# Patient Record
Sex: Female | Born: 1950 | Race: White | Hispanic: No | Smoking: Current every day smoker
Health system: Southern US, Community
[De-identification: ages and names within clinical notes are randomized; demographics above are authoritative.]

## PROBLEM LIST (undated history)

## (undated) DIAGNOSIS — K219 Gastro-esophageal reflux disease without esophagitis: Secondary | ICD-10-CM

## (undated) DIAGNOSIS — T7840XA Allergy, unspecified, initial encounter: Secondary | ICD-10-CM

## (undated) DIAGNOSIS — F419 Anxiety disorder, unspecified: Secondary | ICD-10-CM

## (undated) DIAGNOSIS — R011 Cardiac murmur, unspecified: Secondary | ICD-10-CM

## (undated) DIAGNOSIS — R0902 Hypoxemia: Secondary | ICD-10-CM

## (undated) DIAGNOSIS — E785 Hyperlipidemia, unspecified: Secondary | ICD-10-CM

## (undated) DIAGNOSIS — C50919 Malignant neoplasm of unspecified site of unspecified female breast: Secondary | ICD-10-CM

## (undated) DIAGNOSIS — C801 Malignant (primary) neoplasm, unspecified: Secondary | ICD-10-CM

## (undated) DIAGNOSIS — M1990 Unspecified osteoarthritis, unspecified site: Secondary | ICD-10-CM

## (undated) DIAGNOSIS — J449 Chronic obstructive pulmonary disease, unspecified: Secondary | ICD-10-CM

## (undated) DIAGNOSIS — K5730 Diverticulosis of large intestine without perforation or abscess without bleeding: Secondary | ICD-10-CM

## (undated) DIAGNOSIS — J45909 Unspecified asthma, uncomplicated: Secondary | ICD-10-CM

## (undated) DIAGNOSIS — H269 Unspecified cataract: Secondary | ICD-10-CM

## (undated) DIAGNOSIS — C4490 Unspecified malignant neoplasm of skin, unspecified: Secondary | ICD-10-CM

## (undated) DIAGNOSIS — G709 Myoneural disorder, unspecified: Secondary | ICD-10-CM

## (undated) DIAGNOSIS — R0600 Dyspnea, unspecified: Secondary | ICD-10-CM

## (undated) DIAGNOSIS — Z923 Personal history of irradiation: Secondary | ICD-10-CM

## (undated) DIAGNOSIS — N289 Disorder of kidney and ureter, unspecified: Secondary | ICD-10-CM

## (undated) HISTORY — DX: Chronic obstructive pulmonary disease, unspecified: J44.9

## (undated) HISTORY — DX: Anxiety disorder, unspecified: F41.9

## (undated) HISTORY — DX: Unspecified cataract: H26.9

## (undated) HISTORY — DX: Cardiac murmur, unspecified: R01.1

## (undated) HISTORY — DX: Hypoxemia: R09.02

## (undated) HISTORY — DX: Hyperlipidemia, unspecified: E78.5

## (undated) HISTORY — DX: Unspecified osteoarthritis, unspecified site: M19.90

## (undated) HISTORY — DX: Unspecified malignant neoplasm of skin, unspecified: C44.90

## (undated) HISTORY — DX: Unspecified asthma, uncomplicated: J45.909

## (undated) HISTORY — PX: TUBAL LIGATION: SHX77

## (undated) HISTORY — DX: Myoneural disorder, unspecified: G70.9

## (undated) HISTORY — PX: APPENDECTOMY: SHX54

## (undated) HISTORY — DX: Allergy, unspecified, initial encounter: T78.40XA

## (undated) HISTORY — PX: ECTOPIC PREGNANCY SURGERY: SHX613

---

## 2007-02-12 ENCOUNTER — Ambulatory Visit: Payer: Self-pay | Admitting: Obstetrics and Gynecology

## 2007-04-08 ENCOUNTER — Ambulatory Visit: Payer: Self-pay | Admitting: Gastroenterology

## 2007-11-08 ENCOUNTER — Ambulatory Visit: Payer: Self-pay | Admitting: Emergency Medicine

## 2009-08-04 ENCOUNTER — Ambulatory Visit: Payer: Self-pay | Admitting: Unknown Physician Specialty

## 2009-08-14 ENCOUNTER — Emergency Department: Payer: Self-pay | Admitting: Emergency Medicine

## 2009-08-16 ENCOUNTER — Observation Stay: Payer: Self-pay | Admitting: Internal Medicine

## 2009-09-12 ENCOUNTER — Ambulatory Visit: Payer: Self-pay | Admitting: Gastroenterology

## 2010-01-11 ENCOUNTER — Ambulatory Visit: Payer: Self-pay | Admitting: Obstetrics and Gynecology

## 2011-01-01 ENCOUNTER — Ambulatory Visit: Payer: Self-pay | Admitting: Family Medicine

## 2011-04-03 ENCOUNTER — Ambulatory Visit: Payer: Self-pay | Admitting: Obstetrics and Gynecology

## 2011-04-30 ENCOUNTER — Emergency Department: Payer: Self-pay | Admitting: Emergency Medicine

## 2012-05-20 ENCOUNTER — Ambulatory Visit: Payer: Self-pay | Admitting: Obstetrics and Gynecology

## 2013-04-19 ENCOUNTER — Ambulatory Visit: Payer: Self-pay

## 2013-05-20 ENCOUNTER — Ambulatory Visit: Payer: Self-pay | Admitting: Gastroenterology

## 2013-05-21 LAB — PATHOLOGY REPORT

## 2013-08-11 ENCOUNTER — Ambulatory Visit: Payer: Self-pay

## 2013-08-11 ENCOUNTER — Other Ambulatory Visit: Payer: Self-pay

## 2013-08-11 LAB — COMPREHENSIVE METABOLIC PANEL
Albumin: 4 g/dL (ref 3.4–5.0)
Alkaline Phosphatase: 101 U/L (ref 50–136)
BUN: 12 mg/dL (ref 7–18)
Bilirubin,Total: 0.3 mg/dL (ref 0.2–1.0)
Calcium, Total: 9.6 mg/dL (ref 8.5–10.1)
Co2: 26 mmol/L (ref 21–32)
Creatinine: 0.87 mg/dL (ref 0.60–1.30)
EGFR (African American): >60
Potassium: 4.3 mmol/L (ref 3.5–5.1)
SGPT (ALT): 47 U/L (ref 12–78)
Total Protein: 8.1 g/dL (ref 6.4–8.2)

## 2013-08-11 LAB — CBC WITH DIFFERENTIAL/PLATELET
Basophil #: 0.1 10*3/uL (ref 0.0–0.1)
Basophil %: 1.4 %
Eosinophil #: 0.3 10*3/uL (ref 0.0–0.7)
Eosinophil %: 3.2 %
HGB: 15.3 g/dL (ref 12.0–16.0)
Lymphocyte #: 3.5 10*3/uL (ref 1.0–3.6)
Lymphocyte %: 39.7 %
MCH: 30.8 pg (ref 26.0–34.0)
MCHC: 34.4 g/dL (ref 32.0–36.0)
MCV: 90 fL (ref 80–100)
Monocyte %: 6.6 %
RBC: 4.95 10*6/uL (ref 3.80–5.20)
WBC: 8.7 10*3/uL (ref 3.6–11.0)

## 2013-08-11 LAB — LIPID PANEL
Cholesterol: 248 mg/dL — ABNORMAL HIGH (ref 0–200)
HDL Cholesterol: 36 mg/dL — ABNORMAL LOW (ref 40–60)
VLDL Cholesterol, Calc: 47 mg/dL — ABNORMAL HIGH (ref 5–40)

## 2014-02-06 ENCOUNTER — Ambulatory Visit: Payer: Self-pay | Admitting: Emergency Medicine

## 2014-02-06 LAB — RAPID STREP-A WITH REFLX: Micro Text Report: NEGATIVE

## 2014-02-09 LAB — BETA STREP CULTURE(ARMC)

## 2014-02-23 ENCOUNTER — Ambulatory Visit: Payer: Self-pay

## 2014-02-23 LAB — CBC WITH DIFFERENTIAL/PLATELET
Basophil #: 0.1 10*3/uL (ref 0.0–0.1)
Basophil %: 0.7 %
Eosinophil #: 0.2 10*3/uL (ref 0.0–0.7)
Eosinophil %: 2 %
HCT: 42.5 % (ref 35.0–47.0)
HGB: 14.1 g/dL (ref 12.0–16.0)
Lymphocyte #: 3.5 10*3/uL (ref 1.0–3.6)
Lymphocyte %: 34 %
MCH: 29.9 pg (ref 26.0–34.0)
MCHC: 33.2 g/dL (ref 32.0–36.0)
MCV: 90 fL (ref 80–100)
MONOS PCT: 10.5 %
Monocyte #: 1.1 x10 3/mm — ABNORMAL HIGH (ref 0.2–0.9)
Neutrophil #: 5.4 10*3/uL (ref 1.4–6.5)
Neutrophil %: 52.8 %
Platelet: 232 10*3/uL (ref 150–440)
RBC: 4.72 10*6/uL (ref 3.80–5.20)
RDW: 13.3 % (ref 11.5–14.5)
WBC: 10.3 10*3/uL (ref 3.6–11.0)

## 2014-10-19 ENCOUNTER — Ambulatory Visit: Payer: Self-pay | Admitting: Obstetrics and Gynecology

## 2015-08-31 ENCOUNTER — Other Ambulatory Visit: Payer: Self-pay | Admitting: Nurse Practitioner

## 2015-08-31 DIAGNOSIS — Z1231 Encounter for screening mammogram for malignant neoplasm of breast: Secondary | ICD-10-CM

## 2015-10-23 ENCOUNTER — Ambulatory Visit
Admission: RE | Admit: 2015-10-23 | Discharge: 2015-10-23 | Disposition: A | Discharge status: Home / Self Care | Payer: 59 | Source: Ambulatory Visit | Attending: Nurse Practitioner | Admitting: Nurse Practitioner

## 2015-10-23 DIAGNOSIS — Z1231 Encounter for screening mammogram for malignant neoplasm of breast: Secondary | ICD-10-CM | POA: Insufficient documentation

## 2016-03-01 ENCOUNTER — Emergency Department
Admission: EM | Admit: 2016-03-01 | Discharge: 2016-03-01 | Disposition: A | Discharge status: Home / Self Care | Payer: 59 | Attending: Emergency Medicine | Admitting: Emergency Medicine

## 2016-03-01 ENCOUNTER — Encounter: Payer: Self-pay | Admitting: *Deleted

## 2016-03-01 ENCOUNTER — Emergency Department: Payer: 59

## 2016-03-01 ENCOUNTER — Encounter: Payer: Self-pay | Admitting: Emergency Medicine

## 2016-03-01 ENCOUNTER — Ambulatory Visit
Admission: EM | Admit: 2016-03-01 | Discharge: 2016-03-01 | Disposition: A | Discharge status: Home / Self Care | Payer: 59 | Attending: Family Medicine | Admitting: Family Medicine

## 2016-03-01 DIAGNOSIS — R1030 Lower abdominal pain, unspecified: Secondary | ICD-10-CM

## 2016-03-01 DIAGNOSIS — K5720 Diverticulitis of large intestine with perforation and abscess without bleeding: Secondary | ICD-10-CM | POA: Diagnosis not present

## 2016-03-01 DIAGNOSIS — R079 Chest pain, unspecified: Secondary | ICD-10-CM

## 2016-03-01 DIAGNOSIS — Z79899 Other long term (current) drug therapy: Secondary | ICD-10-CM | POA: Insufficient documentation

## 2016-03-01 DIAGNOSIS — Z720 Tobacco use: Secondary | ICD-10-CM | POA: Diagnosis not present

## 2016-03-01 DIAGNOSIS — F17200 Nicotine dependence, unspecified, uncomplicated: Secondary | ICD-10-CM | POA: Insufficient documentation

## 2016-03-01 DIAGNOSIS — R1032 Left lower quadrant pain: Secondary | ICD-10-CM | POA: Diagnosis present

## 2016-03-01 HISTORY — DX: Gastro-esophageal reflux disease without esophagitis: K21.9

## 2016-03-01 HISTORY — DX: Diverticulosis of large intestine without perforation or abscess without bleeding: K57.30

## 2016-03-01 LAB — COMPREHENSIVE METABOLIC PANEL
ALT: 37 U/L (ref 14–54)
ANION GAP: 9 (ref 5–15)
AST: 34 U/L (ref 15–41)
Albumin: 4.7 g/dL (ref 3.5–5.0)
Alkaline Phosphatase: 78 U/L (ref 38–126)
BUN: 7 mg/dL (ref 6–20)
CHLORIDE: 107 mmol/L (ref 101–111)
CO2: 25 mmol/L (ref 22–32)
Calcium: 10 mg/dL (ref 8.9–10.3)
Creatinine, Ser: 0.72 mg/dL (ref 0.44–1.00)
GFR calc Af Amer: >60 mL/min (ref >60)
Glucose, Bld: 96 mg/dL (ref 65–99)
POTASSIUM: 4.4 mmol/L (ref 3.5–5.1)
Sodium: 141 mmol/L (ref 135–145)
Total Bilirubin: 0.3 mg/dL (ref 0.3–1.2)
Total Protein: 8.4 g/dL — ABNORMAL HIGH (ref 6.5–8.1)

## 2016-03-01 LAB — URINALYSIS COMPLETE WITH MICROSCOPIC (ARMC ONLY)
BACTERIA UA: NONE SEEN
Bilirubin Urine: NEGATIVE
Glucose, UA: NEGATIVE mg/dL
HGB URINE DIPSTICK: NEGATIVE
Ketones, ur: NEGATIVE mg/dL
LEUKOCYTES UA: NEGATIVE
Nitrite: NEGATIVE
PH: 6 (ref 5.0–8.0)
PROTEIN: NEGATIVE mg/dL
RBC / HPF: NONE SEEN RBC/hpf (ref 0–5)
Specific Gravity, Urine: 1.001 — ABNORMAL LOW (ref 1.005–1.030)
WBC UA: NONE SEEN WBC/hpf (ref 0–5)

## 2016-03-01 LAB — TROPONIN I: Troponin I: <0.03 ng/mL (ref <0.031)

## 2016-03-01 LAB — LIPASE, BLOOD: LIPASE: 12 U/L (ref 11–51)

## 2016-03-01 LAB — CBC
HEMATOCRIT: 42.5 % (ref 35.0–47.0)
HEMOGLOBIN: 14.2 g/dL (ref 12.0–16.0)
MCH: 30.8 pg (ref 26.0–34.0)
MCHC: 33.4 g/dL (ref 32.0–36.0)
MCV: 92.4 fL (ref 80.0–100.0)
Platelets: 285 10*3/uL (ref 150–440)
RBC: 4.6 MIL/uL (ref 3.80–5.20)
RDW: 13 % (ref 11.5–14.5)
WBC: 11.9 10*3/uL — AB (ref 3.6–11.0)

## 2016-03-01 MED ORDER — IOPAMIDOL (ISOVUE-300) INJECTION 61%
100.0000 mL | Freq: Once | INTRAVENOUS | Status: AC | PRN
  Administered 2016-03-01: 100 mL via INTRAVENOUS
  Filled 2016-03-01: qty 100

## 2016-03-01 MED ORDER — ONDANSETRON 8 MG PO TBDP: 8.0000 mg | ORAL_TABLET | Freq: Three times a day (TID) | ORAL | Status: DC | PRN

## 2016-03-01 MED ORDER — KETOROLAC TROMETHAMINE 30 MG/ML IJ SOLN
30.0000 mg | Freq: Once | INTRAMUSCULAR | Status: AC
  Administered 2016-03-01: 30 mg via INTRAVENOUS

## 2016-03-01 MED ORDER — KETOROLAC TROMETHAMINE 30 MG/ML IJ SOLN
INTRAMUSCULAR | Status: AC
  Administered 2016-03-01: 30 mg via INTRAVENOUS
  Filled 2016-03-01: qty 1

## 2016-03-01 MED ORDER — LEVOFLOXACIN 750 MG PO TABS: 750.0000 mg | ORAL_TABLET | Freq: Every day | ORAL | Status: DC

## 2016-03-01 MED ORDER — DIATRIZOATE MEGLUMINE & SODIUM 66-10 % PO SOLN
30.0000 mL | ORAL | Status: AC
  Filled 2016-03-01: qty 30

## 2016-03-01 MED ORDER — SODIUM CHLORIDE 0.9 % IV BOLUS (SEPSIS)
1000.0000 mL | Freq: Once | INTRAVENOUS | Status: AC
  Administered 2016-03-01: 1000 mL via INTRAVENOUS

## 2016-03-01 MED ORDER — OXYCODONE-ACETAMINOPHEN 5-325 MG PO TABS: 1.0000 | ORAL_TABLET | Freq: Four times a day (QID) | ORAL | Status: DC | PRN

## 2016-03-01 MED ORDER — METRONIDAZOLE 500 MG PO TABS: 500.0000 mg | ORAL_TABLET | Freq: Three times a day (TID) | ORAL | Status: DC

## 2016-03-01 NOTE — Discharge Instructions (Signed)
Abdominal Pain, Adult Many things can cause belly (abdominal) pain. Most times, the belly pain is not dangerous. Many cases of belly pain can be watched and treated at home. HOME CARE   Do not take medicines that help you go poop (laxatives) unless told to by your doctor.  Only take medicine as told by your doctor.  Eat or drink as told by your doctor. Your doctor will tell you if you should be on a special diet. GET HELP IF:  You do not know what is causing your belly pain.  You have belly pain while you are sick to your stomach (nauseous) or have runny poop (diarrhea).  You have pain while you pee or poop.  Your belly pain wakes you up at night.  You have belly pain that gets worse or better when you eat.  You have belly pain that gets worse when you eat fatty foods.  You have a fever. GET HELP RIGHT AWAY IF:   The pain does not go away within 2 hours.  You keep throwing up (vomiting).  The pain changes and is only in the right or left part of the belly.  You have bloody or tarry looking poop. MAKE SURE YOU:   Understand these instructions.  Will watch your condition.  Will get help right away if you are not doing well or get worse.   This information is not intended to replace advice given to you by your health care provider. Make sure you discuss any questions you have with your health care provider.   Document Released: 04/15/2008 Document Revised: 11/18/2014 Document Reviewed: 07/07/2013 Elsevier Interactive Patient Education 2016 [employer].  Chest Pain Observation It is often hard to give a specific diagnosis for the cause of chest pain. Among other possibilities your symptoms might be caused by inadequate oxygen delivery to your heart (angina). Angina that is not treated or evaluated can lead to a heart attack (myocardial infarction) or death. Blood tests, electrocardiograms, and X-rays may have been done to help determine a possible cause of your chest  pain. After evaluation and observation, your health care provider has determined that it is unlikely your pain was caused by an unstable condition that requires hospitalization. However, a full evaluation of your pain may need to be completed, with additional diagnostic testing as directed. It is very important to keep your follow-up appointments. Not keeping your follow-up appointments could result in permanent heart damage, disability, or death. If there is any problem keeping your follow-up appointments, you must call your health care provider. HOME CARE INSTRUCTIONS  Due to the slight chance that your pain could be angina, it is important to follow your health care provider's treatment plan and also maintain a healthy lifestyle:  Maintain or work toward achieving a healthy weight.  Stay physically active and exercise regularly.  Decrease your salt intake.  Eat a balanced, healthy diet. Talk to a dietitian to learn about heart-healthy foods.  Increase your fiber intake by including whole grains, vegetables, fruits, and nuts in your diet.  Avoid situations that cause stress, anger, or depression.  Take medicines as advised by your health care provider. Report any side effects to your health care provider. Do not stop medicines or adjust the dosages on your own.  Quit smoking. Do not use nicotine patches or gum until you check with your health care provider.  Keep your blood pressure, blood sugar, and cholesterol levels within normal limits.  Limit alcohol intake to no more  than 1 drink per day for women who are not pregnant and 2 drinks per day for men.  Do not abuse drugs. SEEK IMMEDIATE MEDICAL CARE IF: You have severe chest pain or pressure which may include symptoms such as:  You feel pain or pressure in your arms, neck, jaw, or back.  You have severe back or abdominal pain, feel sick to your stomach (nauseous), or throw up (vomit).  You are sweating profusely.  You are having  a fast or irregular heartbeat.  You feel short of breath while at rest.  You notice increasing shortness of breath during rest, sleep, or with activity.  You have chest pain that does not get better after rest or after taking your usual medicine.  You wake from sleep with chest pain.  You are unable to sleep because you cannot breathe.  You develop a frequent cough or you are coughing up blood.  You feel dizzy, faint, or experience extreme fatigue.  You develop severe weakness, dizziness, fainting, or chills. Any of these symptoms may represent a serious problem that is an emergency. Do not wait to see if the symptoms will go away. Call your local emergency services (911 in the [place] Do not drive yourself to the hospital. MAKE SURE YOU:  Understand these instructions.  Will watch your condition.  Will get help right away if you are not doing well or get worse.   This information is not intended to replace advice given to you by your health care provider. Make sure you discuss any questions you have with your health care provider.   Document Released: 11/30/2010 Document Revised: 11/02/2013 Document Reviewed: 04/29/2013 Elsevier Interactive Patient Education 2016 [employer]  Nonspecific Chest Pain  Chest pain can be caused by many different conditions. There is always a chance that your pain could be related to something serious, such as a heart attack or a blood clot in your lungs. Chest pain can also be caused by conditions that are not life-threatening. If you have chest pain, it is very important to follow up with your health care provider. CAUSES  Chest pain can be caused by:  Heartburn.  Pneumonia or bronchitis.  Anxiety or stress.  Inflammation around your heart (pericarditis) or lung (pleuritis or pleurisy).  A blood clot in your lung.  A collapsed lung (pneumothorax). It can develop suddenly on its own (spontaneous pneumothorax) or from trauma to the  chest.  Shingles infection (varicella-zoster virus).  Heart attack.  Damage to the bones, muscles, and cartilage that make up your chest wall. This can include:  Bruised bones due to injury.  Strained muscles or cartilage due to frequent or repeated coughing or overwork.  Fracture to one or more ribs.  Sore cartilage due to inflammation (costochondritis). RISK FACTORS  Risk factors for chest pain may include:  Activities that increase your risk for trauma or injury to your chest.  Respiratory infections or conditions that cause frequent coughing.  Medical conditions or overeating that can cause heartburn.  Heart disease or family history of heart disease.  Conditions or health behaviors that increase your risk of developing a blood clot.  Having had chicken pox (varicella zoster). SIGNS AND SYMPTOMS Chest pain can feel like:  Burning or tingling on the surface of your chest or deep in your chest.  Crushing, pressure, aching, or squeezing pain.  Dull or sharp pain that is worse when you move, cough, or take a deep breath.  Pain that is also felt in  your back, neck, shoulder, or arm, or pain that spreads to any of these areas. Your chest pain may come and go, or it may stay constant. DIAGNOSIS Lab tests or other studies may be needed to find the cause of your pain. Your health care provider may have you take a test called an ambulatory ECG (electrocardiogram). An ECG records your heartbeat patterns at the time the test is performed. You may also have other tests, such as:  Transthoracic echocardiogram (TTE). During echocardiography, sound waves are used to create a picture of all of the heart structures and to look at how blood flows through your heart.  Transesophageal echocardiogram (TEE).This is a more advanced imaging test that obtains images from inside your body. It allows your health care provider to see your heart in finer detail.  Cardiac monitoring. This allows  your health care provider to monitor your heart rate and rhythm in real time.  Holter monitor. This is a portable device that records your heartbeat and can help to diagnose abnormal heartbeats. It allows your health care provider to track your heart activity for several days, if needed.  Stress tests. These can be done through exercise or by taking medicine that makes your heart beat more quickly.  Blood tests.  Imaging tests. TREATMENT  Your treatment depends on what is causing your chest pain. Treatment may include:  Medicines. These may include:  Acid blockers for heartburn.  Anti-inflammatory medicine.  Pain medicine for inflammatory conditions.  Antibiotic medicine, if an infection is present.  Medicines to dissolve blood clots.  Medicines to treat coronary artery disease.  Supportive care for conditions that do not require medicines. This may include:  Resting.  Applying heat or cold packs to injured areas.  Limiting activities until pain decreases. HOME CARE INSTRUCTIONS  If you were prescribed an antibiotic medicine, finish it all even if you start to feel better.  Avoid any activities that bring on chest pain.  Do not use any tobacco products, including cigarettes, chewing tobacco, or electronic cigarettes. If you need help quitting, ask your health care provider.  Do not drink alcohol.  Take medicines only as directed by your health care provider.  Keep all follow-up visits as directed by your health care provider. This is important. This includes any further testing if your chest pain does not go away.  If heartburn is the cause for your chest pain, you may be told to keep your head raised (elevated) while sleeping. This reduces the chance that acid will go from your stomach into your esophagus.  Make lifestyle changes as directed by your health care provider. These may include:  Getting regular exercise. Ask your health care provider to suggest some  activities that are safe for you.  Eating a heart-healthy diet. A registered dietitian can help you to learn healthy eating options.  Maintaining a healthy weight.  Managing diabetes, if necessary.  Reducing stress. SEEK MEDICAL CARE IF:  Your chest pain does not go away after treatment.  You have a rash with blisters on your chest.  You have a fever. SEEK IMMEDIATE MEDICAL CARE IF:   Your chest pain is worse.  You have an increasing cough, or you cough up blood.  You have severe abdominal pain.  You have severe weakness.  You faint.  You have chills.  You have sudden, unexplained chest discomfort.  You have sudden, unexplained discomfort in your arms, back, neck, or jaw.  You have shortness of breath at any time.  You suddenly start to sweat, or your skin gets clammy.  You feel nauseous or you vomit.  You suddenly feel light-headed or dizzy.  Your heart begins to beat quickly, or it feels like it is skipping beats. These symptoms may represent a serious problem that is an emergency. Do not wait to see if the symptoms will go away. Get medical help right away. Call your local emergency services (911 in the [place] Do not drive yourself to the hospital.   This information is not intended to replace advice given to you by your health care provider. Make sure you discuss any questions you have with your health care provider.   Document Released: 08/07/2005 Document Revised: 11/18/2014 Document Reviewed: 06/03/2014 Elsevier Interactive Patient Education [employer].

## 2016-03-01 NOTE — ED Notes (Signed)
CT notified that pt completed contrast at this time

## 2016-03-01 NOTE — Discharge Instructions (Signed)
You were prescribed a medication that is potentially sedating. Do not drink alcohol, drive or participate in any other potentially dangerous activities while taking this medication as it may make you sleepy. Do not take this medication with any other sedating medications, either prescription or over-the-counter. If you were prescribed Percocet or Vicodin, do not take these with acetaminophen (Tylenol) as it is already contained within these medications.   Opioid pain medications (or "narcotics") can be habit forming.  Use it as little as possible to achieve adequate pain control.  Do not use or use it with extreme caution if you have a history of opiate abuse or dependence.  If you are on a pain contract with your primary care doctor or a pain specialist, be sure to let them know you were prescribed this medication today from the [facility].  This medication is intended for your use only - do not give any to anyone else and keep it in a secure place where nobody else, especially children and pets, have access to it.  It will also cause or worsen constipation, so you may want to consider taking an over-the-counter stool softener while you are taking this medication.  Diverticulitis Diverticulitis is inflammation or infection of small pouches in your colon that form when you have a condition called diverticulosis. The pouches in your colon are called diverticula. Your colon, or large intestine, is where water is absorbed and stool is formed. Complications of diverticulitis can include:  Bleeding.  Severe infection.  Severe pain.  Perforation of your colon.  Obstruction of your colon. CAUSES  Diverticulitis is caused by bacteria. Diverticulitis happens when stool becomes trapped in diverticula. This allows bacteria to grow in the diverticula, which can lead to inflammation and infection. RISK FACTORS People with diverticulosis are at risk for diverticulitis. Eating a  diet that does not include enough fiber from fruits and vegetables may make diverticulitis more likely to develop. SYMPTOMS  Symptoms of diverticulitis may include:  Abdominal pain and tenderness. The pain is normally located on the left side of the abdomen, but may occur in other areas.  Fever and chills.  Bloating.  Cramping.  Nausea.  Vomiting.  Constipation.  Diarrhea.  Blood in your stool. DIAGNOSIS  Your health care provider will ask you about your medical history and do a physical exam. You may need to have tests done because many medical conditions can cause the same symptoms as diverticulitis. Tests may include:  Blood tests.  Urine tests.  Imaging tests of the abdomen, including X-rays and CT scans. When your condition is under control, your health care provider may recommend that you have a colonoscopy. A colonoscopy can show how severe your diverticula are and whether something else is causing your symptoms. TREATMENT  Most cases of diverticulitis are mild and can be treated at home. Treatment may include:  Taking over-the-counter pain medicines.  Following a clear liquid diet.  Taking antibiotic medicines by mouth for 7-10 days. More severe cases may be treated at a hospital. Treatment may include:  Not eating or drinking.  Taking prescription pain medicine.  Receiving antibiotic medicines through an IV tube.  Receiving fluids and nutrition through an IV tube.  Surgery. HOME CARE INSTRUCTIONS   Follow your health care provider's instructions carefully.  Follow a full liquid diet or other diet as directed by your health care provider. After your symptoms improve, your health care provider may tell you to change your diet. He or she may  recommend you eat a high-fiber diet. Fruits and vegetables are good sources of fiber. Fiber makes it easier to pass stool.  Take fiber supplements or probiotics as directed by your health care provider.  Only take  medicines as directed by your health care provider.  Keep all your follow-up appointments. SEEK MEDICAL CARE IF:   Your pain does not improve.  You have a hard time eating food.  Your bowel movements do not return to normal. SEEK IMMEDIATE MEDICAL CARE IF:   Your pain becomes worse.  Your symptoms do not get better.  Your symptoms suddenly get worse.  You have a fever.  You have repeated vomiting.  You have bloody or black, tarry stools. MAKE SURE YOU:   Understand these instructions.  Will watch your condition.  Will get help right away if you are not doing well or get worse.   This information is not intended to replace advice given to you by your health care provider. Make sure you discuss any questions you have with your health care provider.   Document Released: 08/07/2005 Document Revised: 11/02/2013 Document Reviewed: 09/22/2013 Elsevier Interactive Patient Education [employer].

## 2016-03-01 NOTE — ED Notes (Signed)
Patient started having lower abdominal pain yesterday after eating lunch and had to leave work early.  Pain is presently located above the pelvis and radiates bilaterally. Patient has been diagnosed with diverticula and has had diverticulitis in the past. Patient was having normal BM's and voiding before yesterday. No BM's since pain began.

## 2016-03-01 NOTE — ED Provider Notes (Addendum)
CSN: [id number]     Arrival date & time 03/01/16  1311 History   First MD Initiated Contact with Patient 03/01/16 1339    Nurses notes were reviewed. Chief Complaint  Patient presents with  . Abdominal Pain   Patient is here because abdominal pain. She states yesterday she started having lower abdominal pain. She had a history of diverticulitis really say when the last time that was. His been several years. More than probably T10. She thinks she had a colonoscopy about 7 years plain polyps at that time as well. She called her primary care physician in [place] who suggested that she come here according to her. At this time we do not have a CT scan tech available to do CT scans and then mentioned that she's been having some vague chest pain for the last 3 or 4 days that comes and goes as well. Reports some shortness of breath but she still smokes. She denies being on any medication. She states her paternal aunt had breast cancer and she's had ectopic pregnancy appendectomy. He is allergic to penicillin and sulfa antibiotics. As well as Biaxin.       (Consider location/radiation/quality/duration/timing/severity/associated sxs/prior Treatment) Patient is a 65 y.o. female presenting with abdominal pain. The history is provided by the patient. No language interpreter was used.  Abdominal Pain Pain location: As lower abdominal pain. Pain quality: sharp   Pain radiates to:  Does not radiate Pain severity:  Moderate Onset quality:  Sudden Progression:  Worsening Chronicity:  New Context: awakening from sleep   Context: not previous surgeries   Relieved by:  Nothing Worsened by:  Nothing tried Ineffective treatments:  None tried Associated symptoms: chest pain and shortness of breath   Risk factors: not pregnant     Past Medical History  Diagnosis Date  . Diverticula of colon   . GERD (gastroesophageal reflux disease)    Past Surgical History  Procedure Laterality Date  . Cesarean  section    . Ectopic pregnancy surgery    . Appendectomy     Family History  Problem Relation Age of Onset  . Breast cancer Paternal Aunt    Social History  Substance Use Topics  . Smoking status: Current Every Day Smoker  . Smokeless tobacco: Never Used  . Alcohol Use: Yes   OB History    No data available     Review of Systems  Respiratory: Positive for shortness of breath.   Cardiovascular: Positive for chest pain.  Gastrointestinal: Positive for abdominal pain.  All other systems reviewed and are negative.   Allergies  Penicillins; Shellfish allergy; Sulfa antibiotics; and Biaxin  Home Medications   Prior to Admission medications   Medication Sig Start Date End Date Taking? Authorizing Provider  lansoprazole (PREVACID) 15 MG capsule Take 15 mg by mouth daily at 12 noon.   Yes Historical Provider, MD   Meds Ordered and Administered this Visit  Medications - No data to display  BP 93/62 mmHg  Pulse 102  Temp(Src) 98.2 F (36.8 C) (Oral)  Resp 18  Ht 5\' 2"  (1.575 m)  Wt 154 lb (69.854 kg)  BMI 28.16 kg/m2  SpO2 97% No data found.   Physical Exam  Constitutional: She is oriented to person, place, and time. She appears well-developed and well-nourished.  HENT:  Head: Normocephalic and atraumatic.  Eyes: Pupils are equal, round, and reactive to light.  Neck: Neck supple.  Cardiovascular: Normal rate and regular rhythm.   Pulmonary/Chest: Effort normal.  Abdominal: There is no hepatosplenomegaly. There is tenderness. There is no CVA tenderness. No hernia. Hernia confirmed negative in the ventral area.    Musculoskeletal: Normal range of motion.  Neurological: She is alert and oriented to person, place, and time.  Psychiatric: She has a normal mood and affect.  Vitals reviewed.   ED Course  Procedures (including critical care time)  Labs Review Labs Reviewed - No data to display  Imaging Review No results found.   Visual Acuity Review  Right  Eye Distance:   Left Eye Distance:   Bilateral Distance:    Right Eye Near:   Left Eye Near:    Bilateral Near:         MDM   1. Lower abdominal pain   2. Chest pain, unspecified chest pain type   3. Tobacco abuse    CT scans available here at this time. Also concerned because of her history of smoking and nonspecific chest pain. Recommend since. They want to have a CT scan sent to [facility] regional ED and discussed with Myriam Jacobson about their eminent arrival. Also concerned about thischest pain this morning TO 3 days. Sister drive her but she wants her sister follow her and since she's not having active chest pain and was allowed to go.  Note: This dictation was prepared with Dragon dictation along with smaller phrase technology. Any transcriptional errors that result from this process are unintentional.    Frederich Cha, MD 03/01/16 1459  Frederich Cha, MD 03/01/16 2156

## 2016-03-01 NOTE — ED Provider Notes (Signed)
[facility] Emergency Department Provider Note  ____________________________________________  Time seen: 3:40 PM  I have reviewed the triage vital signs and the nursing notes.   HISTORY  Chief Complaint Abdominal Pain    HPI Stephanie Hess is a 65 y.o. female sent to the ED from urgent care due to left lower quadrant abdominal pain which she thinks is consistent with previous diverticulitis. She reports that her pain started yesterday after lunch and has been gradually worsening currently moderate in intensity, waxing and waning, nonradiating. No nausea vomiting or diarrhea. No bloody stool. She's actually had constipation for the last 3 days. No chest pain shortness of breath dizziness or syncope. No aggravating or alleviating factors.  PCP is Dr. Howell Rucks"     Past Medical History  Diagnosis Date  . Diverticula of colon   . GERD (gastroesophageal reflux disease)      There are no active problems to display for this patient.    Past Surgical History  Procedure Laterality Date  . Cesarean section    . Ectopic pregnancy surgery    . Appendectomy       Current Outpatient Rx  Name  Route  Sig  Dispense  Refill  . lansoprazole (PREVACID) 15 MG capsule   Oral   Take 15 mg by mouth daily at 12 noon.         Marland Kitchen levofloxacin (LEVAQUIN) 750 MG tablet   Oral   Take 1 tablet (750 mg total) by mouth daily.   7 tablet   0   . metroNIDAZOLE (FLAGYL) 500 MG tablet   Oral   Take 1 tablet (500 mg total) by mouth 3 (three) times daily.   30 tablet   0   . ondansetron (ZOFRAN ODT) 8 MG disintegrating tablet   Oral   Take 1 tablet (8 mg total) by mouth every 8 (eight) hours as needed for nausea or vomiting.   20 tablet   0   . oxyCODONE-acetaminophen (ROXICET) 5-325 MG tablet   Oral   Take 1 tablet by mouth every 6 (six) hours as needed for severe pain.   12 tablet   0      Allergies Penicillins; Shellfish allergy; Sulfa antibiotics;  and Biaxin   Family History  Problem Relation Age of Onset  . Breast cancer Paternal Aunt     Social History Social History  Substance Use Topics  . Smoking status: Current Every Day Smoker  . Smokeless tobacco: Never Used  . Alcohol Use: Yes    Review of Systems  Constitutional:   No fever or chills.  Eyes:   No vision changes.  ENT:   No sore throat. No rhinorrhea. Cardiovascular:   Positive for chest pain which is occasional and only present when leaning over. Not exertional, not pleuritic, achy, nonradiating, not associated with shortness of breath diaphoresis or vomiting.Marland Kitchen Respiratory:   No dyspnea or cough. Gastrointestinal:   Positive as above for left lower quadrant abdominal pain without vomiting and diarrhea.  No bloody stool. Genitourinary:   Negative for dysuria or difficulty urinating. Musculoskeletal:   Negative for focal pain or swelling Neurological:   Negative for headaches 10-point ROS otherwise negative.  ____________________________________________   PHYSICAL EXAM:  VITAL SIGNS: ED Triage Vitals  Enc Vitals Group     BP 03/01/16 1442 159/90 mmHg     Pulse Rate 03/01/16 1442 103     Resp 03/01/16 1442 18     Temp 03/01/16 1442 98.1 F (36.7  C)     Temp Source 03/01/16 1442 Oral     SpO2 03/01/16 1442 99 %     Weight 03/01/16 1442 154 lb (69.854 kg)     Height 03/01/16 1442 5\' 2"  (1.575 m)     Head Cir --      Peak Flow --      Pain Score 03/01/16 1443 2     Pain Loc --      Pain Edu? --      Excl. in [place]? --     Vital signs reviewed, nursing assessments reviewed.   Constitutional:   Alert and oriented. Well appearing and in no distress. Eyes:   No scleral icterus. No conjunctival pallor. PERRL. EOMI ENT   Head:   Normocephalic and atraumatic.   Nose:   No congestion/rhinnorhea. No septal hematoma   Mouth/Throat:   MMM, no pharyngeal erythema. No peritonsillar mass.    Neck:   No stridor. No SubQ emphysema. No  meningismus. Hematological/Lymphatic/Immunilogical:   No cervical lymphadenopathy. Cardiovascular:   RRR. Symmetric bilateral radial and DP pulses.  No murmurs.  Respiratory:   Normal respiratory effort without tachypnea nor retractions. Breath sounds are clear and equal bilaterally. No wheezes/rales/rhonchi. Gastrointestinal:   Soft with bilateral lower quadrant abdominal tenderness. Non distended. There is no CVA tenderness.  No rebound, rigidity, or guarding. Genitourinary:   deferred Musculoskeletal:   Nontender with normal range of motion in all extremities. No joint effusions.  No lower extremity tenderness.  No edema. Chest wall nontender Neurologic:   Normal speech and language.  CN 2-10 normal. Motor grossly intact. No gross focal neurologic deficits are appreciated.  Skin:    Skin is warm, dry and intact. No rash noted.  No petechiae, purpura, or bullae.  ____________________________________________    LABS (pertinent positives/negatives) (all labs ordered are listed, but only abnormal results are displayed) Labs Reviewed  COMPREHENSIVE METABOLIC PANEL - Abnormal; Notable for the following:    Total Protein 8.4 (*)    All other components within normal limits  CBC - Abnormal; Notable for the following:    WBC 11.9 (*)    All other components within normal limits  URINALYSIS COMPLETEWITH MICROSCOPIC ([facility] ONLY) - Abnormal; Notable for the following:    Color, Urine COLORLESS (*)    APPearance CLEAR (*)    Specific Gravity, Urine 1.001 (*)    Squamous Epithelial / LPF 0-5 (*)    All other components within normal limits  LIPASE, BLOOD  TROPONIN I   ____________________________________________   EKG    ____________________________________________    RADIOLOGY  CT abdomen and pelvis shows sigmoid diverticulitis with one small area of microperforation. No abscess  formation.  ____________________________________________   PROCEDURES   ____________________________________________   INITIAL IMPRESSION / ASSESSMENT AND PLAN / ED COURSE  Pertinent labs & imaging results that were available during my care of the patient were reviewed by me and considered in my medical decision making (see chart for details).  Patient well appearing no acute distress nontoxic. Mildly tachycardic at 100 on arrival which is resolved on exam for me. She reports moderate pain but looks very comfortable and is smiling and pleasant. CT obtained to evaluate for diverticulitis particularly with her previous history. This shows sigmoid diverticulitis with one small area of microperforation without any serious features. Labs are unremarkable, white blood cell count is normal as is the chemistry panel. On repeat abdominal exam at 6:30 PM, the patient is nontender except for very mild tenderness  in the left lower quadrant. There is no rebound rigidity or guarding or any other evidence of peritonitis. I discussed these results with the patient who is amenable to outpatient treatment and follow-up with her primary care doctor next week. I'll start her on Levaquin and Flagyl. Zofran and Percocet for pain and nausea control. Return precautions discussed, she will come back if her symptoms worsen including severe abdominal pain vomiting or fever. The chest pain she's been having on and off for the past 3 or 4 days is very atypical and not concerning for ACS PE TAD carditis pneumonia and pneumothorax or sepsis. Troponin negative which I think adequately evaluates this pain. It appears to be a very minor symptom and the patient is not concerned about it.    ____________________________________________   FINAL CLINICAL IMPRESSION(S) / ED DIAGNOSES  Final diagnoses:  Diverticulitis of large intestine with perforation without bleeding   left lower quadrant abdominal pain     Portions of  this note were generated with dragon dictation software. Dictation errors may occur despite best attempts at proofreading.   Carrie Mew, MD 03/01/16 [phone]

## 2016-03-01 NOTE — ED Notes (Signed)
Constipation, last BM yesterday.

## 2016-03-01 NOTE — ED Notes (Signed)
C/O stomach cramps, lower abdominal pain.  Feels urge to move bowels, but unable.  Last BM yesterday morning for hard, small amount.  Took a fleet suppository this morning at 1130, no results yet.

## 2016-05-06 ENCOUNTER — Encounter: Payer: Self-pay | Admitting: Gastroenterology

## 2016-05-06 ENCOUNTER — Ambulatory Visit (INDEPENDENT_AMBULATORY_CARE_PROVIDER_SITE_OTHER): Payer: 59 | Admitting: Gastroenterology

## 2016-05-06 VITALS — BP 139/76 | HR 90 | Temp 97.7°F | Ht 62.0 in | Wt 152.0 lb

## 2016-05-06 DIAGNOSIS — K5732 Diverticulitis of large intestine without perforation or abscess without bleeding: Secondary | ICD-10-CM

## 2016-05-06 NOTE — Progress Notes (Signed)
Primary Care Physician: Ronnell Freshwater, NP  Primary Gastroenterologist:  Dr. Lucilla Lame  Chief Complaint  Patient presents with  . Diverticulitis    HPI: Stephanie Hess is a 65 y.o. female here for follow-up after being emergency room back in April. At that time the patient had diverticulitis and was treated with antibodies. The patient states she has been fine since then without any complaints. The patient was told to follow-up with her gastrologist due to diverticulitis. She denies any abdominal pain nausea vomiting fevers or chills.  Current Outpatient Prescriptions  Medication Sig Dispense Refill  . albuterol (PROVENTIL HFA;VENTOLIN HFA) 108 (90 Base) MCG/ACT inhaler Inhale into the lungs every 6 (six) hours as needed for wheezing or shortness of breath.    Marland Kitchen omeprazole (PRILOSEC) 20 MG capsule Take 20 mg by mouth daily.    . lansoprazole (PREVACID) 15 MG capsule Take 15 mg by mouth daily at 12 noon. Reported on 05/06/2016    . levofloxacin (LEVAQUIN) 750 MG tablet Take 1 tablet (750 mg total) by mouth daily. (Patient not taking: Reported on 05/06/2016) 7 tablet 0  . metroNIDAZOLE (FLAGYL) 500 MG tablet Take 1 tablet (500 mg total) by mouth 3 (three) times daily. (Patient not taking: Reported on 05/06/2016) 30 tablet 0  . ondansetron (ZOFRAN ODT) 8 MG disintegrating tablet Take 1 tablet (8 mg total) by mouth every 8 (eight) hours as needed for nausea or vomiting. (Patient not taking: Reported on 05/06/2016) 20 tablet 0  . oxyCODONE-acetaminophen (ROXICET) 5-325 MG tablet Take 1 tablet by mouth every 6 (six) hours as needed for severe pain. (Patient not taking: Reported on 05/06/2016) 12 tablet 0   No current facility-administered medications for this visit.    Allergies as of 05/06/2016 - Review Complete 05/06/2016  Allergen Reaction Noted  . Penicillins Nausea And Vomiting 03/01/2016  . Shellfish allergy Nausea And Vomiting 03/01/2016  . Sulfa antibiotics Rash 03/01/2016  .  Biaxin [clarithromycin] Rash 03/01/2016  . Iodinated diagnostic agents Other (See Comments) 05/06/2016  . Macrobid [nitrofurantoin] Rash 05/06/2016    ROS:  General: Negative for anorexia, weight loss, fever, chills, fatigue, weakness. ENT: Negative for hoarseness, difficulty swallowing , nasal congestion. CV: Negative for chest pain, angina, palpitations, dyspnea on exertion, peripheral edema.  Respiratory: Negative for dyspnea at rest, dyspnea on exertion, cough, sputum, wheezing.  GI: See history of present illness. GU:  Negative for dysuria, hematuria, urinary incontinence, urinary frequency, nocturnal urination.  Endo: Negative for unusual weight change.    Physical Examination:   BP 139/76 mmHg  Pulse 90  Temp(Src) 97.7 F (36.5 C) (Oral)  Ht 5\' 2"  (1.575 m)  Wt 152 lb (68.947 kg)  BMI 27.79 kg/m2  General: Well-nourished, well-developed in no acute distress.  Eyes: No icterus. Conjunctivae pink. Neuro: Alert and oriented x 3.  Grossly intact. Skin: Warm and dry, no jaundice.   Psych: Alert and cooperative, normal mood and affect.  Labs:    Imaging Studies: No results found.  Assessment and Plan:   Stephanie Hess is a 52 y.o. y/o female who comes for follow-up with a history of diverticulitis. The patient reports she is only had one attack this year. The patient has been told to contact me if her diverticulitis and colitis comes back or her symptoms of left lower quadrant pain returned. She may be treated as an outpatient if the symptoms are mild. The patient has been explained the plan and agrees with it.   Note: This dictation  was prepared with Dragon dictation along with smaller phrase technology. Any transcriptional errors that result from this process are unintentional.

## 2016-05-09 ENCOUNTER — Emergency Department
Admission: EM | Admit: 2016-05-09 | Discharge: 2016-05-09 | Disposition: A | Discharge status: Home / Self Care | Payer: 59 | Attending: Emergency Medicine | Admitting: Emergency Medicine

## 2016-05-09 ENCOUNTER — Emergency Department: Payer: 59

## 2016-05-09 ENCOUNTER — Encounter: Payer: Self-pay | Admitting: Emergency Medicine

## 2016-05-09 DIAGNOSIS — R0789 Other chest pain: Secondary | ICD-10-CM | POA: Diagnosis not present

## 2016-05-09 DIAGNOSIS — Z7951 Long term (current) use of inhaled steroids: Secondary | ICD-10-CM | POA: Diagnosis not present

## 2016-05-09 DIAGNOSIS — F17200 Nicotine dependence, unspecified, uncomplicated: Secondary | ICD-10-CM | POA: Insufficient documentation

## 2016-05-09 DIAGNOSIS — M436 Torticollis: Secondary | ICD-10-CM | POA: Diagnosis present

## 2016-05-09 LAB — BASIC METABOLIC PANEL
Anion gap: 9 (ref 5–15)
BUN: 11 mg/dL (ref 6–20)
CO2: 23 mmol/L (ref 22–32)
CREATININE: 1.08 mg/dL — AB (ref 0.44–1.00)
Calcium: 9.7 mg/dL (ref 8.9–10.3)
Chloride: 105 mmol/L (ref 101–111)
GFR calc Af Amer: >60 mL/min (ref >60)
GFR, EST NON AFRICAN AMERICAN: 53 mL/min — AB (ref >60)
GLUCOSE: 120 mg/dL — AB (ref 65–99)
POTASSIUM: 3.8 mmol/L (ref 3.5–5.1)
Sodium: 137 mmol/L (ref 135–145)

## 2016-05-09 LAB — CBC
HEMATOCRIT: 38.9 % (ref 35.0–47.0)
Hemoglobin: 13.5 g/dL (ref 12.0–16.0)
MCH: 31.4 pg (ref 26.0–34.0)
MCHC: 34.6 g/dL (ref 32.0–36.0)
MCV: 90.7 fL (ref 80.0–100.0)
PLATELETS: 258 10*3/uL (ref 150–440)
RBC: 4.29 MIL/uL (ref 3.80–5.20)
RDW: 13.2 % (ref 11.5–14.5)
WBC: 10.8 10*3/uL (ref 3.6–11.0)

## 2016-05-09 LAB — TROPONIN I: Troponin I: <0.03 ng/mL (ref <0.03)

## 2016-05-09 MED ORDER — CYCLOBENZAPRINE HCL 10 MG PO TABS
5.0000 mg | ORAL_TABLET | Freq: Once | ORAL | Status: AC
  Administered 2016-05-09: 5 mg via ORAL
  Filled 2016-05-09: qty 1

## 2016-05-09 MED ORDER — IBUPROFEN 600 MG PO TABS: 600.0000 mg | ORAL_TABLET | Freq: Three times a day (TID) | ORAL | Status: DC | PRN

## 2016-05-09 MED ORDER — CYCLOBENZAPRINE HCL 5 MG PO TABS: 5.0000 mg | ORAL_TABLET | Freq: Three times a day (TID) | ORAL | Status: DC | PRN

## 2016-05-09 MED ORDER — IBUPROFEN 800 MG PO TABS
800.0000 mg | ORAL_TABLET | Freq: Once | ORAL | Status: AC
  Administered 2016-05-09: 800 mg via ORAL
  Filled 2016-05-09: qty 1

## 2016-05-09 NOTE — ED Notes (Signed)
Pt reports she moved her recliner last night and AC unit last week

## 2016-05-09 NOTE — ED Notes (Signed)
MD at bedside. 

## 2016-05-09 NOTE — ED Notes (Signed)
Reviewed d/c instructions, follow-up care, prescriptions, use of ice/elevation with pt. Pt verbalized understanding 

## 2016-05-09 NOTE — ED Provider Notes (Signed)
CSN: [id number]     Arrival date & time 05/09/16  1759 History   First MD Initiated Contact with Patient 05/09/16 1913     Chief Complaint  Patient presents with  . Chest Pain  . Torticollis     (Consider location/radiation/quality/duration/timing/severity/associated sxs/prior Treatment) The history is provided by the patient.  Stephanie Hess is a 65 y.o. female hx of GERD, diverticulitis here with Right-sided neck pain, shoulder pain, chest pain. Patient states that she had acute onset of right neck pain radiated to her chest around 3:30 AM this morning. It woke her from sleep and she put a hot patch on and took some Motrin and went back to sleep. She did lift up heavy items recently and did move her recliner yesterday before she got her bed. This morning she has some pain that came back in the right neck and back area. Patient describe it as sharp but nonradiating. Denies any cough or fevers.   Has some subjective shortness of breath that resolved. Denies any recent travel or history of blood clots.   Past Medical History  Diagnosis Date  . Diverticula of colon   . GERD (gastroesophageal reflux disease)    Past Surgical History  Procedure Laterality Date  . Cesarean section    . Ectopic pregnancy surgery    . Appendectomy     Family History  Problem Relation Age of Onset  . Breast cancer Paternal Aunt    Social History  Substance Use Topics  . Smoking status: Current Every Day Smoker  . Smokeless tobacco: Never Used  . Alcohol Use: Yes   OB History    No data available     Review of Systems  Cardiovascular: Positive for chest pain.  All other systems reviewed and are negative.     Allergies  Penicillins; Shellfish allergy; Sulfa antibiotics; Biaxin; Iodinated diagnostic agents; and Macrobid  Home Medications   Prior to Admission medications   Medication Sig Start Date End Date Taking? Authorizing Provider  albuterol (PROVENTIL HFA;VENTOLIN HFA) 108 (90 Base)  MCG/ACT inhaler Inhale into the lungs every 6 (six) hours as needed for wheezing or shortness of breath.    Historical Provider, MD  lansoprazole (PREVACID) 15 MG capsule Take 15 mg by mouth daily at 12 noon. Reported on 05/06/2016    Historical Provider, MD  levofloxacin (LEVAQUIN) 750 MG tablet Take 1 tablet (750 mg total) by mouth daily. Patient not taking: Reported on 05/06/2016 03/01/16   Carrie Mew, MD  metroNIDAZOLE (FLAGYL) 500 MG tablet Take 1 tablet (500 mg total) by mouth 3 (three) times daily. Patient not taking: Reported on 05/06/2016 03/01/16   Carrie Mew, MD  omeprazole (PRILOSEC) 20 MG capsule Take 20 mg by mouth daily.    Historical Provider, MD  ondansetron (ZOFRAN ODT) 8 MG disintegrating tablet Take 1 tablet (8 mg total) by mouth every 8 (eight) hours as needed for nausea or vomiting. Patient not taking: Reported on 05/06/2016 03/01/16   Carrie Mew, MD  oxyCODONE-acetaminophen (ROXICET) 5-325 MG tablet Take 1 tablet by mouth every 6 (six) hours as needed for severe pain. Patient not taking: Reported on 05/06/2016 03/01/16   Carrie Mew, MD   BP 134/78 mmHg  Pulse 107  Temp(Src) 97.6 F (36.4 C) (Oral)  Resp 18  Ht 5\' 2"  (1.575 m)  Wt 152 lb (68.947 kg)  BMI 27.79 kg/m2  SpO2 97% Physical Exam  Constitutional: She is oriented to person, place, and time. She appears well-developed and well-nourished.  HENT:  Head: Normocephalic.  Right Ear: External ear normal.  Left Ear: External ear normal.  Mouth/Throat: Oropharynx is clear and moist.  Eyes: Conjunctivae are normal. Pupils are equal, round, and reactive to light.  Neck:  No SCM tenderness, no midline tenderness, nl ROM   Cardiovascular: Normal rate, regular rhythm and normal heart sounds.   Pulmonary/Chest: Effort normal and breath sounds normal. No respiratory distress. She has no wheezes.  Reproducible tenderness R upper chest   Abdominal: Soft. Bowel sounds are normal. She exhibits no  distension. There is no tenderness. There is no rebound.  Musculoskeletal:  + tenderness R trapezius area, no obvious deformity. Nl ROM R shoulder   Neurological: She is alert and oriented to person, place, and time.  Skin: Skin is warm and dry.  Psychiatric: She has a normal mood and affect. Her behavior is normal. Judgment and thought content normal.  Nursing note and vitals reviewed.   ED Course  Procedures (including critical care time) Labs Review Labs Reviewed  BASIC METABOLIC PANEL - Abnormal; Notable for the following:    Glucose, Bld 120 (*)    Creatinine, Ser 1.08 (*)    GFR calc non Af Amer 53 (*)    All other components within normal limits  CBC  TROPONIN I    Imaging Review Dg Chest 2 View  05/09/2016  CLINICAL DATA:  Right-sided chest pain EXAM: CHEST  2 VIEW COMPARISON:  February 23, 2014 FINDINGS: There is mild scarring in the right middle lobe along the superior aspect. Lungs elsewhere clear. Heart size and pulmonary vascularity are normal. No adenopathy. No pneumothorax. No bone lesions. IMPRESSION: Mild scarring superior right middle lobe. Lungs elsewhere clear. No pneumothorax. Cardiac silhouette within normal limits. Electronically Signed   By: Lowella Grip III M.D.   On: 05/09/2016 18:47   I have personally reviewed and evaluated these images and lab results as part of my medical decision-making.   EKG Interpretation None      ED ECG REPORT I, Korene Dula, the attending physician, personally viewed and interpreted this ECG.   Date: 05/09/2016  EKG Time: 18:04  Rate: 100  Rhythm: normal EKG, normal sinus rhythm  Axis: normal  Intervals:none  ST&T Change: none    MDM   Final diagnoses:  None    Stephanie Hess is a 65 y.o. female here with chest pain, trapezius pain after heavy lifting. Pain is reproducible. No recent travel, not hypoxic. I doubt PE. I doubt dissection or ACS. Pain since yesterday so trop x 1 sufficient and she is low risk.    8:19 PM Labs unremarkable. CXR showed some scarring R middle lobe. She is a smoker but never had previous lung surgery. Labs unremarkable. Pain resolved with motrin, flexeril. Likely muscle strain. Can get outpatient follow up CXR in a month for the scarring in the lung.     Wandra Arthurs, MD 05/09/16 2020

## 2016-05-09 NOTE — Discharge Instructions (Signed)
Take motrin for pain .  Take flexeril for muscle spasms.  No heavy lifting for 2-3 days.   See your doctor.  You have some scarring in the right lung. Recommend repeat chest xray in a month to make sure it hasn't changed.   Return to ER if you have worse chest pain, shortness of breath, neck pain

## 2016-05-09 NOTE — ED Notes (Addendum)
Pt c/o sudden onset R sided chest pain with radiation to her neck that is worse with movement. Pt states pain started approx 0330 this AM, she took some motrin and pain resolved. Pt states pain returned at 1530 today and has not resolved at this time. Pt states some SHOB at this time.

## 2016-05-23 ENCOUNTER — Other Ambulatory Visit: Payer: Self-pay | Admitting: Physician Assistant

## 2016-05-23 DIAGNOSIS — R0602 Shortness of breath: Secondary | ICD-10-CM

## 2016-05-28 ENCOUNTER — Ambulatory Visit
Admission: RE | Admit: 2016-05-28 | Discharge: 2016-05-28 | Disposition: A | Discharge status: Home / Self Care | Payer: 59 | Source: Ambulatory Visit | Attending: Physician Assistant | Admitting: Physician Assistant

## 2016-05-28 DIAGNOSIS — J439 Emphysema, unspecified: Secondary | ICD-10-CM | POA: Insufficient documentation

## 2016-05-28 DIAGNOSIS — I700 Atherosclerosis of aorta: Secondary | ICD-10-CM | POA: Insufficient documentation

## 2016-05-28 DIAGNOSIS — K760 Fatty (change of) liver, not elsewhere classified: Secondary | ICD-10-CM | POA: Insufficient documentation

## 2016-05-28 DIAGNOSIS — J984 Other disorders of lung: Secondary | ICD-10-CM | POA: Insufficient documentation

## 2016-05-28 DIAGNOSIS — I2510 Atherosclerotic heart disease of native coronary artery without angina pectoris: Secondary | ICD-10-CM | POA: Diagnosis not present

## 2016-05-28 DIAGNOSIS — R0602 Shortness of breath: Secondary | ICD-10-CM

## 2016-05-28 DIAGNOSIS — R918 Other nonspecific abnormal finding of lung field: Secondary | ICD-10-CM | POA: Insufficient documentation

## 2016-05-28 DIAGNOSIS — J479 Bronchiectasis, uncomplicated: Secondary | ICD-10-CM | POA: Insufficient documentation

## 2016-05-28 HISTORY — DX: Disorder of kidney and ureter, unspecified: N28.9

## 2016-05-28 MED ORDER — IOPAMIDOL (ISOVUE-300) INJECTION 61%
75.0000 mL | Freq: Once | INTRAVENOUS | Status: AC | PRN
  Administered 2016-05-28: 60 mL via INTRAVENOUS

## 2016-05-31 ENCOUNTER — Other Ambulatory Visit
Admission: RE | Admit: 2016-05-31 | Discharge: 2016-05-31 | Disposition: A | Discharge status: Home / Self Care | Payer: 59 | Source: Ambulatory Visit | Attending: Physician Assistant | Admitting: Physician Assistant

## 2016-05-31 DIAGNOSIS — J479 Bronchiectasis, uncomplicated: Secondary | ICD-10-CM | POA: Insufficient documentation

## 2016-05-31 LAB — CBC WITH DIFFERENTIAL/PLATELET
BASOS ABS: 0.1 10*3/uL (ref 0–0.1)
BASOS PCT: 1 %
Eosinophils Absolute: 0.4 10*3/uL (ref 0–0.7)
Eosinophils Relative: 5 %
HEMATOCRIT: 41.7 % (ref 35.0–47.0)
Hemoglobin: 14.2 g/dL (ref 12.0–16.0)
Lymphocytes Relative: 41 %
Lymphs Abs: 3.5 10*3/uL (ref 1.0–3.6)
MCH: 31.3 pg (ref 26.0–34.0)
MCHC: 34.1 g/dL (ref 32.0–36.0)
MCV: 91.7 fL (ref 80.0–100.0)
MONO ABS: 0.7 10*3/uL (ref 0.2–0.9)
MONOS PCT: 8 %
NEUTROS ABS: 3.9 10*3/uL (ref 1.4–6.5)
Neutrophils Relative %: 45 %
PLATELETS: 284 10*3/uL (ref 150–440)
RBC: 4.55 MIL/uL (ref 3.80–5.20)
RDW: 13.1 % (ref 11.5–14.5)
WBC: 8.6 10*3/uL (ref 3.6–11.0)

## 2016-05-31 LAB — COMPREHENSIVE METABOLIC PANEL
ALBUMIN: 4.2 g/dL (ref 3.5–5.0)
ALT: 34 U/L (ref 14–54)
AST: 31 U/L (ref 15–41)
Alkaline Phosphatase: 89 U/L (ref 38–126)
Anion gap: 7 (ref 5–15)
BILIRUBIN TOTAL: 0.4 mg/dL (ref 0.3–1.2)
BUN: 10 mg/dL (ref 6–20)
CALCIUM: 9.6 mg/dL (ref 8.9–10.3)
CO2: 23 mmol/L (ref 22–32)
CREATININE: 0.79 mg/dL (ref 0.44–1.00)
Chloride: 109 mmol/L (ref 101–111)
GFR calc Af Amer: >60 mL/min (ref >60)
Glucose, Bld: 92 mg/dL (ref 65–99)
Potassium: 4.2 mmol/L (ref 3.5–5.1)
Sodium: 139 mmol/L (ref 135–145)
Total Protein: 7.9 g/dL (ref 6.5–8.1)

## 2016-05-31 LAB — LIPID PANEL
CHOLESTEROL: 254 mg/dL — AB (ref 0–200)
HDL: 32 mg/dL — ABNORMAL LOW (ref >40)
LDL CALC: 147 mg/dL — AB (ref 0–99)
Total CHOL/HDL Ratio: 7.9 RATIO
Triglycerides: 373 mg/dL — ABNORMAL HIGH (ref <150)
VLDL: 75 mg/dL — AB (ref 0–40)

## 2016-05-31 LAB — FERRITIN: FERRITIN: 36 ng/mL (ref 11–307)

## 2016-05-31 LAB — EXPECTORATED SPUTUM ASSESSMENT W REFEX TO RESP CULTURE

## 2016-05-31 LAB — TSH: TSH: 2.254 u[IU]/mL (ref 0.350–4.500)

## 2016-05-31 LAB — EXPECTORATED SPUTUM ASSESSMENT W GRAM STAIN, RFLX TO RESP C

## 2016-06-01 LAB — T4: T4 TOTAL: 6.3 ug/dL (ref 4.5–12.0)

## 2016-06-04 ENCOUNTER — Other Ambulatory Visit
Admission: RE | Admit: 2016-06-04 | Discharge: 2016-06-04 | Disposition: A | Discharge status: Home / Self Care | Payer: 59 | Source: Ambulatory Visit | Attending: Cardiology | Admitting: Cardiology

## 2016-06-04 ENCOUNTER — Encounter: Payer: Self-pay | Admitting: Cardiology

## 2016-06-04 ENCOUNTER — Ambulatory Visit (INDEPENDENT_AMBULATORY_CARE_PROVIDER_SITE_OTHER): Payer: 59 | Admitting: Cardiology

## 2016-06-04 VITALS — BP 110/66 | HR 86 | Ht 62.0 in | Wt 151.5 lb

## 2016-06-04 DIAGNOSIS — I25118 Atherosclerotic heart disease of native coronary artery with other forms of angina pectoris: Secondary | ICD-10-CM

## 2016-06-04 DIAGNOSIS — R079 Chest pain, unspecified: Secondary | ICD-10-CM | POA: Insufficient documentation

## 2016-06-04 DIAGNOSIS — E785 Hyperlipidemia, unspecified: Secondary | ICD-10-CM | POA: Diagnosis not present

## 2016-06-04 DIAGNOSIS — F17200 Nicotine dependence, unspecified, uncomplicated: Secondary | ICD-10-CM | POA: Diagnosis not present

## 2016-06-04 LAB — TROPONIN I

## 2016-06-04 MED ORDER — METOPROLOL SUCCINATE ER 25 MG PO TB24: 25.0000 mg | ORAL_TABLET | Freq: Every day | ORAL | 6 refills | Status: DC

## 2016-06-04 MED ORDER — ATORVASTATIN CALCIUM 80 MG PO TABS: 80.0000 mg | ORAL_TABLET | Freq: Every day | ORAL | 6 refills | Status: DC

## 2016-06-04 MED ORDER — NITROGLYCERIN 0.4 MG SL SUBL: 0.4000 mg | SUBLINGUAL_TABLET | SUBLINGUAL | 6 refills | Status: DC | PRN

## 2016-06-04 MED ORDER — ASPIRIN EC 81 MG PO TBEC: 81.0000 mg | DELAYED_RELEASE_TABLET | Freq: Every day | ORAL | 3 refills | Status: DC

## 2016-06-04 NOTE — Progress Notes (Signed)
Cardiology Office Note   Date:  06/04/2016   ID:  Stephanie Hess, DOB 24-Jan-1951, MRN [id number]  Referring Doctor:  Lavera Guise, MD   Cardiologist:   Wende Bushy, MD   Reason for consultation:  Chief Complaint  Patient presents with  . Other    C/o sob. Meds reviewed verbally with pt.      History of Present Illness: Stephanie Hess is a 65 y.o. female who presents for Evaluation of chest pain and shortness of breath.  Symptoms have been going on since April 2017. Chest pain is described as a sharp stabbing pain, to the left of the center of the chest, nonradiating, mild in intensity, brought on by positional change mainly bending down, not really exertional in nature.  Chronic shortness of breath. Patient was being evaluated for her smoking history. CT scan of the chest was done and this revealed incidental finding of calcified atherosclerotic plaque in the LAD and RCA. This was dated 05/28/2016. Patient was also noted to have bronchiectasis, paraseptal emphysema, and pulmonary nodules.  Patient was then scheduled for an exercise stress test. This was this morning. Patient went 9 minutes and 34 seconds. No official report noted. Patient recalls developing slight pain on the left side at peak exercise or stage III.  Patient denies loss of consciousness, dizziness, PND, orthopnea, edema.   ROS:  Please see the history of present illness. Aside from mentioned under HPI, all other systems are reviewed and negative.     Past Medical History:  Diagnosis Date  . Diverticula of colon   . GERD (gastroesophageal reflux disease)   . Hyperlipidemia   . Renal insufficiency    2009 or 2010    Past Surgical History:  Procedure Laterality Date  . APPENDECTOMY    . CESAREAN SECTION    . ECTOPIC PREGNANCY SURGERY       reports that she has been smoking Cigarettes.  She has smoked for the past 50.00 years. She has never used smokeless tobacco. She reports that she drinks  alcohol. She reports that she does not use drugs.   family history includes Breast cancer in her paternal aunt; Heart attack in her mother. CAD in the family, late onset, 20s in the brother, 56s and the mother.  Outpatient Medications Prior to Visit  Medication Sig Dispense Refill  . albuterol (PROVENTIL HFA;VENTOLIN HFA) 108 (90 Base) MCG/ACT inhaler Inhale into the lungs every 6 (six) hours as needed for wheezing or shortness of breath.    . cyclobenzaprine (FLEXERIL) 5 MG tablet Take 1 tablet (5 mg total) by mouth 3 (three) times daily as needed for muscle spasms. 10 tablet 0  . ibuprofen (ADVIL,MOTRIN) 600 MG tablet Take 1 tablet (600 mg total) by mouth every 8 (eight) hours as needed. 30 tablet 0  . omeprazole (PRILOSEC) 20 MG capsule Take 20 mg by mouth daily.    . ondansetron (ZOFRAN ODT) 8 MG disintegrating tablet Take 1 tablet (8 mg total) by mouth every 8 (eight) hours as needed for nausea or vomiting. 20 tablet 0  . oxyCODONE-acetaminophen (ROXICET) 5-325 MG tablet Take 1 tablet by mouth every 6 (six) hours as needed for severe pain. 12 tablet 0  . lansoprazole (PREVACID) 15 MG capsule Take 15 mg by mouth daily at 12 noon. Reported on 05/06/2016    . levofloxacin (LEVAQUIN) 750 MG tablet Take 1 tablet (750 mg total) by mouth daily. (Patient not taking: Reported on 05/06/2016) 7 tablet 0  .  metroNIDAZOLE (FLAGYL) 500 MG tablet Take 1 tablet (500 mg total) by mouth 3 (three) times daily. (Patient not taking: Reported on 05/06/2016) 30 tablet 0   No facility-administered medications prior to visit.      Allergies: Penicillins; Shellfish allergy; Sulfa antibiotics; Biaxin [clarithromycin]; Iodinated diagnostic agents; and Macrobid [nitrofurantoin]    PHYSICAL EXAM: VS:  BP 110/66 (BP Location: Left Arm, Patient Position: Sitting, Cuff Size: Normal)   Pulse 86   Ht 5\' 2"  (1.575 m)   Wt 151 lb 8 oz (68.7 kg)   BMI 27.71 kg/m  , Body mass index is 27.71 kg/m. Wt Readings from Last 3  Encounters:  06/04/16 151 lb 8 oz (68.7 kg)  05/09/16 152 lb (68.9 kg)  05/06/16 152 lb (68.9 kg)    GENERAL:  well developed, well nourished, Overweight, not in acute distress HEENT: normocephalic, pink conjunctivae, anicteric sclerae, no xanthelasma, normal dentition, oropharynx clear NECK:  no neck vein engorgement, JVP normal, no hepatojugular reflux, carotid upstroke brisk and symmetric, no bruit, no thyromegaly, no lymphadenopathy LUNGS:  good respiratory effort, clear to auscultation bilaterally CV:  PMI not displaced, no thrills, no lifts, S1 and S2 within normal limits, no palpable S3 or S4, no murmurs, no rubs, no gallops ABD:  Soft, nontender, nondistended, normoactive bowel sounds, no abdominal aortic bruit, no hepatomegaly, no splenomegaly MS: nontender back, no kyphosis, no scoliosis, no joint deformities EXT:  2+ DP/PT pulses, no edema, no varicosities, no cyanosis, no clubbing SKIN: warm, nondiaphoretic, normal turgor, no ulcers NEUROPSYCH: alert, oriented to person, place, and time, sensory/motor grossly intact, normal mood, appropriate affect  Recent Labs: 05/31/2016: ALT 34; BUN 10; Creatinine, Ser 0.79; Hemoglobin 14.2; Platelets 284; Potassium 4.2; Sodium 139; TSH 2.254   Lipid Panel    Component Value Date/Time   CHOL 254 (H) 05/31/2016 0759   CHOL 248 (H) 08/11/2013 0924   TRIG 373 (H) 05/31/2016 0759   TRIG 236 (H) 08/11/2013 0924   HDL 32 (L) 05/31/2016 0759   HDL 36 (L) 08/11/2013 0924   CHOLHDL 7.9 05/31/2016 0759   VLDL 75 (H) 05/31/2016 0759   VLDL 47 (H) 08/11/2013 0924   LDLCALC 147 (H) 05/31/2016 0759   LDLCALC 165 (H) 08/11/2013 [id number]     Other studies Reviewed:  EKG:  The ekg from 06/04/2016 was personally reviewed by me and it revealed sinus rhythm, 86 BPM  Additional studies/ records that were reviewed personally reviewed by me today include: None available   ASSESSMENT AND PLAN: Coronary artery disease as noted on CT scan showing  atherosclerotic plaque in the LAD and RCA. This was the incidental finding on a CT of the chest. Chest pain is somewhat atypical No ischemia noted on current EKG  Since patient is having very mild chest pain currently, recommend stat troponin. If positive recommend hospital admission. If negative, proceed with outpatient evaluation.  Recommend to start medical therapy for CAD: Aspirin 81 mg by mouth daily, atorvastatin 80 mg by mouth daily at bedtime, metoprolol ER 25 mg by mouth daily, nitroglycerin sublingual when necessary for chest pain. Recommend patient to call 911 for unrelenting chest pain.  Recommend echocardiogram  Recommend exercise nuclear stress testing for further evaluation.  Hyperlipidemia LDL goal is less than 70. LFTs were within normal limits from blood work 05/31/2016. Recommend to start atorvastatin 80 mg by mouth daily at bedtime. Reevaluate LFTs and lipid panel in 3 months time.  Tobacco use We discussed the importance of smoking cessation and different strategies for  quitting.     Current medicines are reviewed at length with the patient today.  The patient does not have concerns regarding medicines.  Labs/ tests ordered today include:  Orders Placed This Encounter  Procedures  . NM Myocar Multi W/Spect W/Wall Motion / EF  . Troponin I  . EKG 12-Lead  . ECHOCARDIOGRAM COMPLETE    I had a lengthy and detailed discussion with the patient regarding diagnoses, prognosis, diagnostic options, treatment options , and side effects of medications.   I counseled the patient on importance of lifestyle modification including heart healthy diet, regular physical activity , and smoking cessation.   Disposition:   FU with undersigned after tests   I spent at least 80 minutes with the patient today and more than 50% of the time was spent counseling the patient and coordinating care.     Signed, Wende Bushy, MD  06/04/2016 5:06 PM    [place]  This note was generated in part with voice recognition software and I apologize for any typographical errors that were not detected and corrected.

## 2016-06-04 NOTE — Patient Instructions (Addendum)
Medication Instructions:  Your physician has recommended you make the following change in your medication:  1. Start taking Aspirin 81 mg Once daily 2. Start Metoprolol 25 mg Once daily 3. Start Lipitor 80 mg Once daily at bedtime 4. Nitroglycerin under the tongue as needed for chest pain. May take 1 tablet every 5 minutes up to a total of 3 doses. Please read attached materials before taking first dose.   Labwork: Your physician recommends that you go to [facility] to have a troponin level drawn.   Testing/Procedures: Your physician has requested that you have an echocardiogram. Echocardiography is a painless test that uses sound waves to create images of your heart. It provides your doctor with information about the size and shape of your heart and how well your heart's chambers and valves are working. This procedure takes approximately one hour. There are no restrictions for this procedure.  [place]  Your caregiver has ordered a Stress Test with nuclear imaging. The purpose of this test is to evaluate the blood supply to your heart muscle. This procedure is referred to as a "Non-Invasive Stress Test." This is because other than having an IV started in your vein, nothing is inserted or "invades" your body. Cardiac stress tests are done to find areas of poor blood flow to the heart by determining the extent of coronary artery disease (CAD). Some patients exercise on a treadmill, which naturally increases the blood flow to your heart, while others who are  unable to walk on a treadmill due to physical limitations have a pharmacologic/chemical stress agent called Lexiscan . This medicine will mimic walking on a treadmill by temporarily increasing your coronary blood flow.   Please note: these test may take anywhere between 2-4 hours to complete  PLEASE REPORT TO [place] AT THE FIRST DESK WILL DIRECT YOU WHERE TO GO  Date of Procedure:__Tuesday June 11, 2016  at 08:00AM___  Arrival Time for Procedure:__Arrive at 07:45AM to register___   PLEASE NOTIFY THE OFFICE AT LEAST 24 HOURS IN ADVANCE IF YOU ARE UNABLE TO [place].  [phone] AND  PLEASE NOTIFY NUCLEAR MEDICINE AT [facility] AT LEAST 24 HOURS IN ADVANCE IF YOU ARE UNABLE TO KEEP YOUR APPOINTMENT. [phone]  How to prepare for your Myoview test:  1. Do not eat or drink after midnight 2. No caffeine for 24 hours prior to test 3. No smoking 24 hours prior to test. 4. Your medication may be taken with water.  If your doctor stopped a medication because of this test, do not take that medication. 5. Ladies, please do not wear dresses.  Skirts or pants are appropriate. Please wear a short sleeve shirt. 6. No perfume, cologne or lotion. 7. Wear comfortable walking shoes. No heels!   Follow-Up: Your physician recommends that you schedule a follow-up appointment after testing with Dr. Yvone Neu.  It was a pleasure seeing you today here in the office. Please do not hesitate to give [place] a call back if you have any further questions. [place], BSN      Any Other Special Instructions Will Be Listed Below (If Applicable).     If you need a refill on your cardiac medications before your next appointment, please call your pharmacy.  Echocardiogram An echocardiogram, or echocardiography, uses sound waves (ultrasound) to produce an image of your heart. The echocardiogram is simple, painless, obtained within a short period of time, and offers valuable information to your health  care provider. The images from an echocardiogram can provide information such as:  Evidence of coronary artery disease (CAD).  Heart size.  Heart muscle function.  Heart valve function.  Aneurysm detection.  Evidence of a past heart attack.  Fluid buildup around the heart.  Heart muscle thickening.  Assess heart valve function. LET [facility] CARE PROVIDER KNOW ABOUT:  Any  allergies you have.  All medicines you are taking, including vitamins, herbs, eye drops, creams, and over-the-counter medicines.  Previous problems you or members of your family have had with the use of anesthetics.  Any blood disorders you have.  Previous surgeries you have had.  Medical conditions you have.  Possibility of pregnancy, if this applies. BEFORE THE PROCEDURE  No special preparation is needed. Eat and drink normally.  PROCEDURE   In order to produce an image of your heart, gel will be applied to your chest and a wand-like tool (transducer) will be moved over your chest. The gel will help transmit the sound waves from the transducer. The sound waves will harmlessly bounce off your heart to allow the heart images to be captured in real-time motion. These images will then be recorded.  You may need an IV to receive a medicine that improves the quality of the pictures. AFTER THE PROCEDURE You may return to your normal schedule including diet, activities, and medicines, unless your health care provider tells you otherwise.   This information is not intended to replace advice given to you by your health care provider. Make sure you discuss any questions you have with your health care provider.   Document Released: 10/25/2000 Document Revised: 11/18/2014 Document Reviewed: 07/05/2013 Elsevier Interactive Patient Education 2016 [place].     Cardiac Nuclear Scanning A cardiac nuclear scan is used to check your heart for problems, such as the following:  A portion of the heart is not getting enough blood.  Part of the heart muscle has died, which happens with a heart attack.  The heart wall is not working normally.  In this test, a radioactive dye (tracer) is injected into your bloodstream. After the tracer has traveled to your heart, a scanning device is used to measure how much of the tracer is absorbed by or distributed to various areas of your heart. LET [facility] CARE PROVIDER KNOW ABOUT:  Any allergies you have.  All medicines you are taking, including vitamins, herbs, eye drops, creams, and over-the-counter medicines.  Previous problems you or members of your family have had with the use of anesthetics.  Any blood disorders you have.  Previous surgeries you have had.  Medical conditions you have.  RISKS AND COMPLICATIONS Generally, this is a safe procedure. However, as with any procedure, problems can occur. Possible problems include:   Serious chest pain.  Rapid heartbeat.  Sensation of warmth in your chest. This usually passes quickly. BEFORE THE PROCEDURE Ask your health care provider about changing or stopping your regular medicines. PROCEDURE This procedure is usually done at a hospital and takes 2-4 hours.  An IV tube is inserted into one of your veins.  Your health care provider will inject a small amount of radioactive tracer through the tube.  You will then wait for 20-40 minutes while the tracer travels through your bloodstream.  You will lie down on an exam table so images of your heart can be taken. Images will be taken for about 15-20 minutes.  You will exercise on a treadmill or stationary bike. While  you exercise, your heart activity will be monitored with an electrocardiogram (ECG), and your blood pressure will be checked.  If you are unable to exercise, you may be given a medicine to make your heart beat faster.  When blood flow to your heart has peaked, tracer will again be injected through the IV tube.  After 20-40 minutes, you will get back on the exam table and have more images taken of your heart.  When the procedure is over, your IV tube will be removed. AFTER THE PROCEDURE  You will likely be able to leave shortly after the test. Unless your health care provider tells you otherwise, you may return to your normal schedule, including diet, activities, and medicines.  Make sure you find out how and  when you will get your test results.   This information is not intended to replace advice given to you by your health care provider. Make sure you discuss any questions you have with your health care provider.   Document Released: 11/22/2004 Document Revised: 11/02/2013 Document Reviewed: 10/06/2013 Elsevier Interactive Patient Education 2016 Elsevier Inc.   Nitroglycerin sublingual tablets What is this medicine? NITROGLYCERIN (nye troe GLI ser in) is a type of vasodilator. It relaxes blood vessels, increasing the blood and oxygen supply to your heart. This medicine is used to relieve chest pain caused by angina. It is also used to prevent chest pain before activities like climbing stairs, going outdoors in cold weather, or sexual activity. This medicine may be used for other purposes; ask your health care provider or pharmacist if you have questions. What should I tell my health care provider before I take this medicine? They need to know if you have any of these conditions: -anemia -head injury, recent stroke, or bleeding in the brain -liver disease -previous heart attack -an unusual or allergic reaction to nitroglycerin, other medicines, foods, dyes, or preservatives -pregnant or trying to get pregnant -breast-feeding How should I use this medicine? Take this medicine by mouth as needed. At the first sign of an angina attack (chest pain or tightness) place one tablet under your tongue. You can also take this medicine 5 to 10 minutes before an event likely to produce chest pain. Follow the directions on the prescription label. Let the tablet dissolve under the tongue. Do not swallow whole. Replace the dose if you accidentally swallow it. It will help if your mouth is not dry. Saliva around the tablet will help it to dissolve more quickly. Do not eat or drink, smoke or chew tobacco while a tablet is dissolving. If you are not better within 5 minutes after taking ONE dose of nitroglycerin,  call 9-1-1 immediately to seek emergency medical care. Do not take more than 3 nitroglycerin tablets over 15 minutes. If you take this medicine often to relieve symptoms of angina, your doctor or health care professional may provide you with different instructions to manage your symptoms. If symptoms do not go away after following these instructions, it is important to call 9-1-1 immediately. Do not take more than 3 nitroglycerin tablets over 15 minutes. Talk to your pediatrician regarding the use of this medicine in children. Special care may be needed. Overdosage: If you think you have taken too much of this medicine contact a poison control center or emergency room at once. NOTE: This medicine is only for you. Do not share this medicine with others. What if I miss a dose? This does not apply. This medicine is only used as needed. What  may interact with this medicine? Do not take this medicine with any of the following medications: -certain migraine medicines like ergotamine and dihydroergotamine (DHE) -medicines used to treat erectile dysfunction like sildenafil, tadalafil, and vardenafil -riociguat This medicine may also interact with the following medications: -alteplase -aspirin -heparin -medicines for high blood pressure -medicines for mental depression -other medicines used to treat angina -phenothiazines like chlorpromazine, mesoridazine, prochlorperazine, thioridazine This list may not describe all possible interactions. Give your health care provider a list of all the medicines, herbs, non-prescription drugs, or dietary supplements you use. Also tell them if you smoke, drink alcohol, or use illegal drugs. Some items may interact with your medicine. What should I watch for while using this medicine? Tell your doctor or health care professional if you feel your medicine is no longer working. Keep this medicine with you at all times. Sit or lie down when you take your medicine to  prevent falling if you feel dizzy or faint after using it. Try to remain calm. This will help you to feel better faster. If you feel dizzy, take several deep breaths and lie down with your feet propped up, or bend forward with your head resting between your knees. You may get drowsy or dizzy. Do not drive, use machinery, or do anything that needs mental alertness until you know how this drug affects you. Do not stand or sit up quickly, especially if you are an older patient. This reduces the risk of dizzy or fainting spells. Alcohol can make you more drowsy and dizzy. Avoid alcoholic drinks. Do not treat yourself for coughs, colds, or pain while you are taking this medicine without asking your doctor or health care professional for advice. Some ingredients may increase your blood pressure. What side effects may I notice from receiving this medicine? Side effects that you should report to your doctor or health care professional as soon as possible: -blurred vision -dry mouth -skin rash -sweating -the feeling of extreme pressure in the head -unusually weak or tired Side effects that usually do not require medical attention (report to your doctor or health care professional if they continue or are bothersome): -flushing of the face or neck -headache -irregular heartbeat, palpitations -nausea, vomiting This list may not describe all possible side effects. Call your doctor for medical advice about side effects. You may report side effects to FDA at 1-800-FDA-1088. Where should I keep my medicine? Keep out of the reach of children. Store at room temperature between 20 and 25 degrees C (68 and 77 degrees F). Store in [employer]. Protect from light and moisture. Keep tightly closed. Throw away any unused medicine after the expiration date. NOTE: This sheet is a summary. It may not cover all possible information. If you have questions about this medicine, talk to your doctor, pharmacist, or health  care provider.    2016, Elsevier/Gold Standard. (2013-08-26 17:57:36)

## 2016-06-11 ENCOUNTER — Encounter
Admission: RE | Admit: 2016-06-11 | Discharge: 2016-06-11 | Disposition: A | Discharge status: Home / Self Care | Payer: 59 | Source: Ambulatory Visit | Attending: Cardiology | Admitting: Cardiology

## 2016-06-11 DIAGNOSIS — R079 Chest pain, unspecified: Secondary | ICD-10-CM

## 2016-06-11 MED ORDER — TECHNETIUM TC 99M TETROFOSMIN IV KIT
13.0000 | PACK | Freq: Once | INTRAVENOUS | Status: AC | PRN
  Administered 2016-06-11: 12.913 via INTRAVENOUS

## 2016-06-11 MED ORDER — TECHNETIUM TC 99M TETROFOSMIN IV KIT
31.0610 | PACK | Freq: Once | INTRAVENOUS | Status: AC | PRN
  Administered 2016-06-11: 31.061 via INTRAVENOUS

## 2016-06-12 LAB — NM MYOCAR MULTI W/SPECT W/WALL MOTION / EF
CHL CUP NUCLEAR SSS: 2
CSEPED: 7 min
CSEPEDS: 0 s
CSEPHR: 86 %
Estimated workload: 7 METS
LV sys vol: 24 mL
LVDIAVOL: 51 mL (ref 46–106)
Peak HR: 136 {beats}/min
Rest HR: 70 {beats}/min
SDS: 0
SRS: 12
TID: 0.81

## 2016-06-20 ENCOUNTER — Other Ambulatory Visit: Payer: Self-pay

## 2016-06-20 ENCOUNTER — Ambulatory Visit (INDEPENDENT_AMBULATORY_CARE_PROVIDER_SITE_OTHER): Payer: 59

## 2016-06-20 DIAGNOSIS — R079 Chest pain, unspecified: Secondary | ICD-10-CM

## 2016-07-02 ENCOUNTER — Other Ambulatory Visit
Admission: RE | Admit: 2016-07-02 | Discharge: 2016-07-02 | Disposition: A | Discharge status: Home / Self Care | Payer: 59 | Source: Ambulatory Visit | Attending: Physician Assistant | Admitting: Physician Assistant

## 2016-07-02 DIAGNOSIS — J479 Bronchiectasis, uncomplicated: Secondary | ICD-10-CM | POA: Diagnosis present

## 2016-07-02 LAB — EXPECTORATED SPUTUM ASSESSMENT W GRAM STAIN, RFLX TO RESP C

## 2016-07-02 LAB — EXPECTORATED SPUTUM ASSESSMENT W REFEX TO RESP CULTURE

## 2016-07-03 NOTE — Progress Notes (Signed)
Cardiology Office Note   Date:  07/04/2016   ID:  Stephanie Hess, DOB 1950/12/15, MRN [id number]  Referring Doctor:  Lavera Guise, MD   Cardiologist:   Wende Bushy, MD   Reason for consultation:  Chief Complaint  Patient presents with  . Other    ffup for SOB      History of Present Illness: Stephanie Hess is a 65 y.o. female who presents forFollow-up after testing. She presented previously with chest pain and shortness of breath.   Since last visit, though symptoms have mainly resolved. Currently, she did be dealing with a sinus infection. She's been having fever, headache, cough since Monday. She has seen her PCP and has been started on medications for this.   Patient denies loss of consciousness, dizziness, PND, orthopnea, edema.   ROS:  Please see the history of present illness. Aside from mentioned under HPI, all other systems are reviewed and negative.     Past Medical History:  Diagnosis Date  . Diverticula of colon   . GERD (gastroesophageal reflux disease)   . Hyperlipidemia   . Renal insufficiency    2009 or 2010    Past Surgical History:  Procedure Laterality Date  . APPENDECTOMY    . CESAREAN SECTION    . ECTOPIC PREGNANCY SURGERY       reports that she has been smoking Cigarettes.  She has smoked for the past 50.00 years. She has never used smokeless tobacco. She reports that she drinks alcohol. She reports that she does not use drugs.   family history includes Breast cancer in her paternal aunt; Heart attack in her mother. CAD in the family, late onset, 35s in the brother, 30s and the mother.  Outpatient Medications Prior to Visit  Medication Sig Dispense Refill  . albuterol (PROVENTIL HFA;VENTOLIN HFA) 108 (90 Base) MCG/ACT inhaler Inhale into the lungs every 6 (six) hours as needed for wheezing or shortness of breath.    Marland Kitchen atorvastatin (LIPITOR) 80 MG tablet Take 1 tablet (80 mg total) by mouth daily. 30 tablet 6  . cyclobenzaprine  (FLEXERIL) 5 MG tablet Take 1 tablet (5 mg total) by mouth 3 (three) times daily as needed for muscle spasms. 10 tablet 0  . ibuprofen (ADVIL,MOTRIN) 600 MG tablet Take 1 tablet (600 mg total) by mouth every 8 (eight) hours as needed. 30 tablet 0  . metoprolol succinate (TOPROL XL) 25 MG 24 hr tablet Take 1 tablet (25 mg total) by mouth daily. 30 tablet 6  . nitroGLYCERIN (NITROSTAT) 0.4 MG SL tablet Place 1 tablet (0.4 mg total) under the tongue every 5 (five) minutes as needed. 25 tablet 6  . omeprazole (PRILOSEC) 20 MG capsule Take 20 mg by mouth daily.    Marland Kitchen aspirin EC 81 MG tablet Take 1 tablet (81 mg total) by mouth daily. (Patient not taking: Reported on 07/04/2016) 90 tablet 3  . ondansetron (ZOFRAN ODT) 8 MG disintegrating tablet Take 1 tablet (8 mg total) by mouth every 8 (eight) hours as needed for nausea or vomiting. (Patient not taking: Reported on 07/04/2016) 20 tablet 0  . oxyCODONE-acetaminophen (ROXICET) 5-325 MG tablet Take 1 tablet by mouth every 6 (six) hours as needed for severe pain. (Patient not taking: Reported on 07/04/2016) 12 tablet 0   No facility-administered medications prior to visit.      Allergies: Penicillins; Shellfish allergy; Sulfa antibiotics; Biaxin [clarithromycin]; Iodinated diagnostic agents; and Macrobid [nitrofurantoin]    PHYSICAL EXAM: VS:  BP  90/70 (BP Location: Left Arm, Patient Position: Sitting, Cuff Size: Normal)   Pulse 98   Ht 5' 2"  (1.575 m)   Wt 149 lb 12.8 oz (67.9 kg)   BMI 27.40 kg/m  , Body mass index is 27.4 kg/m. Wt Readings from Last 3 Encounters:  07/04/16 149 lb 12.8 oz (67.9 kg)  06/04/16 151 lb 8 oz (68.7 kg)  05/09/16 152 lb (68.9 kg)    GENERAL:  well developed, well nourished, Overweight, not in acute distress HEENT: normocephalic, pink conjunctivae, anicteric sclerae, no xanthelasma, normal dentition, oropharynx clear NECK:  no neck vein engorgement, JVP normal, no hepatojugular reflux, carotid upstroke brisk and  symmetric, no bruit, no thyromegaly, no lymphadenopathy LUNGS:  good respiratory effort, clear to auscultation bilaterally CV:  PMI not displaced, no thrills, no lifts, S1 and S2 within normal limits, no palpable S3 or S4, no murmurs, no rubs, no gallops ABD:  Soft, nontender, nondistended, normoactive bowel sounds, no abdominal aortic bruit, no hepatomegaly, no splenomegaly MS: nontender back, no kyphosis, no scoliosis, no joint deformities EXT:  2+ DP/PT pulses, no edema, no varicosities, no cyanosis, no clubbing SKIN: warm, nondiaphoretic, normal turgor, no ulcers NEUROPSYCH: alert, oriented to person, place, and time, sensory/motor grossly intact, normal mood, appropriate affect  Recent Labs: 05/31/2016: ALT 34; BUN 10; Creatinine, Ser 0.79; Hemoglobin 14.2; Platelets 284; Potassium 4.2; Sodium 139; TSH 2.254   Lipid Panel    Component Value Date/Time   CHOL 254 (H) 05/31/2016 0759   CHOL 248 (H) 08/11/2013 0924   TRIG 373 (H) 05/31/2016 0759   TRIG 236 (H) 08/11/2013 0924   HDL 32 (L) 05/31/2016 0759   HDL 36 (L) 08/11/2013 0924   CHOLHDL 7.9 05/31/2016 0759   VLDL 75 (H) 05/31/2016 0759   VLDL 47 (H) 08/11/2013 0924   LDLCALC 147 (H) 05/31/2016 0759   LDLCALC 165 (H) 08/11/2013 3009     Other studies Reviewed:  EKG:  The ekg from 06/04/2016 was personally reviewed by me and it revealed sinus rhythm, 86 BPM  Additional studies/ records that were reviewed personally reviewed by me today include:  Echo 06/20/2016: - Left ventricle: The cavity size was normal. Wall thickness was   normal. Systolic function was normal. The estimated ejection   fraction was in the range of 55% to 60%. Wall motion was normal;   there were no regional wall motion abnormalities. Doppler   parameters are consistent with abnormal left ventricular   relaxation (grade 1 diastolic dysfunction).  Nuclear stress test 06/11/2016: Exercise myocardial perfusion imaging study with no significant   ischemia Normal wall motion, EF estimated at 79% Very mild GI uptake artifact noted No EKG changes concerning for ischemia at peak stress or in recovery.  Patient achieved target heart rate, 86% of maximum predicted Exercised for 7 minutes, achieved 7 METS Low risk scan   ASSESSMENT AND PLAN: Coronary artery disease as noted on CT scan showing atherosclerotic plaque in the LAD and RCA. This was the incidental finding on a CT of the chest. Chest pain is somewhat atypical No ischemia noted on current EKG  Findings of echo and stresses discussed with patient. No evidence of ischemia on the study. Likelihood of clinically significant CAD is low. Patient reassured. Continue medical therapy for CAD: Aspirin 81 mg by mouth daily, atorvastatin 80 mg by mouth daily at bedtime, metoprolol ER 25 mg by mouth daily. Patient advised to closely monitor her blood pressure during this viral or sinus infection. If she has  poor by mouth intake and her blood pressure is running on the low side, she may hold the metoprolol but should inform our office.  Hyperlipidemia LDL goal is less than 70. LFTs were within normal limits from blood work 05/31/2016. Continue atorvastatin 80 mg by mouth daily at bedtime. Reevaluate LFTs and lipid panel in 3 months time. Recheck In October. Will adjust up accordingly.  Tobacco use We discussed the importance of smoking cessation and different strategies for quitting.    Current medicines are reviewed at length with the patient today.  The patient does not have concerns regarding medicines.  Labs/ tests ordered today include:  Orders Placed This Encounter  Procedures  . Comp Met (CMET)  . Lipid Profile    I had a lengthy and detailed discussion with the patient regarding diagnoses, prognosis, diagnostic options, treatment options , and side effects of medications.   I counseled the patient on importance of lifestyle modification including heart healthy diet, regular  physical activity , and smoking cessation.   Disposition:   FU with undersigned in 6 months   Signed, Wende Bushy, MD  07/04/2016 4:32 PM    [place]  This note was generated in part with voice recognition software and I apologize for any typographical errors that were not detected and corrected.

## 2016-07-04 ENCOUNTER — Encounter: Payer: Self-pay | Admitting: Cardiology

## 2016-07-04 ENCOUNTER — Ambulatory Visit (INDEPENDENT_AMBULATORY_CARE_PROVIDER_SITE_OTHER): Payer: 59 | Admitting: Cardiology

## 2016-07-04 VITALS — BP 90/70 | HR 98 | Ht 62.0 in | Wt 149.8 lb

## 2016-07-04 DIAGNOSIS — I25118 Atherosclerotic heart disease of native coronary artery with other forms of angina pectoris: Secondary | ICD-10-CM | POA: Diagnosis not present

## 2016-07-04 DIAGNOSIS — F17200 Nicotine dependence, unspecified, uncomplicated: Secondary | ICD-10-CM | POA: Diagnosis not present

## 2016-07-04 DIAGNOSIS — E785 Hyperlipidemia, unspecified: Secondary | ICD-10-CM

## 2016-07-04 NOTE — Patient Instructions (Signed)
Labwork: Your physician recommends that you return for lab work in: October for CMP and lipid profile. Make sure to not eat or drink after midnight prior to coming in for these labs.   Follow-Up: Your physician wants you to follow-up in: 6 months with Dr. Yvone Neu. You will receive a reminder letter in the mail two months in advance. If you don't receive a letter, please call our office to schedule the follow-up appointment.  It was a pleasure seeing you today here in the office. Please do not hesitate to give [place] a call back if you have any further questions. [place], BSN

## 2016-07-05 LAB — CULTURE, RESPIRATORY

## 2016-07-05 LAB — CULTURE, RESPIRATORY W GRAM STAIN: Culture: NORMAL

## 2016-08-07 ENCOUNTER — Other Ambulatory Visit: Payer: Self-pay

## 2016-08-07 ENCOUNTER — Telehealth: Payer: Self-pay | Admitting: Cardiology

## 2016-08-07 DIAGNOSIS — E785 Hyperlipidemia, unspecified: Secondary | ICD-10-CM

## 2016-08-07 NOTE — Telephone Encounter (Signed)
Due to change in schedule, unable to draw labs in office 10/2. Pt agreeable to labs at the [facility] lab. Order changed.

## 2016-08-12 ENCOUNTER — Other Ambulatory Visit: Payer: 59

## 2016-08-12 ENCOUNTER — Other Ambulatory Visit
Admission: RE | Admit: 2016-08-12 | Discharge: 2016-08-12 | Disposition: A | Discharge status: Home / Self Care | Payer: 59 | Source: Ambulatory Visit | Attending: Cardiology | Admitting: Cardiology

## 2016-08-12 DIAGNOSIS — E785 Hyperlipidemia, unspecified: Secondary | ICD-10-CM | POA: Insufficient documentation

## 2016-08-12 DIAGNOSIS — R079 Chest pain, unspecified: Secondary | ICD-10-CM

## 2016-08-12 LAB — LIPID PANEL
CHOL/HDL RATIO: 6.3 ratio
Cholesterol: 228 mg/dL — ABNORMAL HIGH (ref 0–200)
HDL: 36 mg/dL — ABNORMAL LOW (ref >40)
LDL Cholesterol: 143 mg/dL — ABNORMAL HIGH (ref 0–99)
Triglycerides: 245 mg/dL — ABNORMAL HIGH (ref <150)
VLDL: 49 mg/dL — ABNORMAL HIGH (ref 0–40)

## 2016-08-12 LAB — COMPREHENSIVE METABOLIC PANEL
ALBUMIN: 4.3 g/dL (ref 3.5–5.0)
ALK PHOS: 79 U/L (ref 38–126)
ALT: 33 U/L (ref 14–54)
ANION GAP: 9 (ref 5–15)
AST: 29 U/L (ref 15–41)
BUN: 10 mg/dL (ref 6–20)
CALCIUM: 9.8 mg/dL (ref 8.9–10.3)
CO2: 23 mmol/L (ref 22–32)
Chloride: 107 mmol/L (ref 101–111)
Creatinine, Ser: 0.9 mg/dL (ref 0.44–1.00)
GFR calc Af Amer: >60 mL/min (ref >60)
GFR calc non Af Amer: >60 mL/min (ref >60)
GLUCOSE: 93 mg/dL (ref 65–99)
Potassium: 3.9 mmol/L (ref 3.5–5.1)
SODIUM: 139 mmol/L (ref 135–145)
Total Bilirubin: 0.4 mg/dL (ref 0.3–1.2)
Total Protein: 8.2 g/dL — ABNORMAL HIGH (ref 6.5–8.1)

## 2016-08-12 LAB — TROPONIN I

## 2016-08-13 ENCOUNTER — Telehealth: Payer: Self-pay | Admitting: *Deleted

## 2016-08-13 NOTE — Telephone Encounter (Signed)
-----   Message from Wende Bushy, MD sent at 08/13/2016 11:25 AM EDT ----- Please forward results to PCP. Patient has been compliant with high dose atorvastatin?

## 2016-08-13 NOTE — Telephone Encounter (Signed)
Reviewed results and patient states that she stopped taking atorvastatin due to rash. She spoke with PCP and they started her on Triglide 160 mg once daily and has been on that for 2 weeks. Reviewed lab results with her and she verbalized understanding with no further questions. Let her know that I would make Dr. Yvone Neu aware of the medication changes.

## 2016-10-16 ENCOUNTER — Encounter: Payer: Self-pay | Admitting: *Deleted

## 2016-10-16 ENCOUNTER — Ambulatory Visit
Admission: EM | Admit: 2016-10-16 | Discharge: 2016-10-16 | Disposition: A | Discharge status: Home / Self Care | Payer: 59 | Attending: Emergency Medicine | Admitting: Emergency Medicine

## 2016-10-16 ENCOUNTER — Ambulatory Visit (INDEPENDENT_AMBULATORY_CARE_PROVIDER_SITE_OTHER): Payer: 59

## 2016-10-16 DIAGNOSIS — J40 Bronchitis, not specified as acute or chronic: Secondary | ICD-10-CM | POA: Diagnosis not present

## 2016-10-16 DIAGNOSIS — J0100 Acute maxillary sinusitis, unspecified: Secondary | ICD-10-CM | POA: Diagnosis not present

## 2016-10-16 LAB — RAPID INFLUENZA A&B ANTIGENS (ARMC ONLY): INFLUENZA B (ARMC): NEGATIVE

## 2016-10-16 LAB — RAPID INFLUENZA A&B ANTIGENS: Influenza A (ARMC): NEGATIVE

## 2016-10-16 MED ORDER — IBUPROFEN 800 MG PO TABS: 800.0000 mg | ORAL_TABLET | Freq: Three times a day (TID) | ORAL | 0 refills | Status: DC | PRN

## 2016-10-16 MED ORDER — PREDNISONE 50 MG PO TABS: 50.0000 mg | ORAL_TABLET | Freq: Every day | ORAL | 0 refills | Status: DC

## 2016-10-16 MED ORDER — AEROCHAMBER PLUS MISC: 2 refills | Status: DC

## 2016-10-16 MED ORDER — ALBUTEROL SULFATE HFA 108 (90 BASE) MCG/ACT IN AERS: 2.0000 | INHALATION_SPRAY | RESPIRATORY_TRACT | 0 refills | Status: DC | PRN

## 2016-10-16 MED ORDER — DOXYCYCLINE HYCLATE 100 MG PO CAPS: 100.0000 mg | ORAL_CAPSULE | Freq: Two times a day (BID) | ORAL | 0 refills | Status: DC

## 2016-10-16 MED ORDER — IPRATROPIUM-ALBUTEROL 0.5-2.5 (3) MG/3ML IN SOLN
3.0000 mL | Freq: Once | RESPIRATORY_TRACT | Status: AC
  Administered 2016-10-16: 3 mL via RESPIRATORY_TRACT

## 2016-10-16 NOTE — ED Triage Notes (Signed)
Patient started having nasal drainage / congestion, fever and body aches 3 days ago. Symptoms have worsened over time.

## 2016-10-16 NOTE — ED Provider Notes (Signed)
HPI  SUBJECTIVE:  Stephanie Hess is a 65 y.o. female who presents with nasal congestion, rhinorrhea, postnasal drip, bilateral ear fullness, sore throat, sinus pain and pressure, purulent nasal drainage for the past 5 days. She reports a cough productive of yellow, thick sputum, wheezing, chest soreness secondary to the cough. Is complaining of a frontal/side headache as well. States that she has been chewing a lot of gum recently due to bad breath. She states that her symptoms are better with ibuprofen 800 mg, she also tried leftover Levaquin last night and some Benadryl. There are no aggravating factors. She states that she has gotten acutely worse today. She denies fevers, hearing changes, otorrhea, bodyaches. No upper dental pain, facial swelling. No chest pain, shortness of breath. States that she is sleeping well at night. She was on doxycycline for "fluid in her ears" in October, but no other antibiotics in the past month. No antipyretic in the past 6-8 hours. She has past medical history of TMJ disorders, h/o renal insufficiency no known CKD, hypercholesterolemia, GERD. No history of asthma, emphysema or formal diagnosis of COPD. She has been smoking half a pack a day for the past 50 years. No history of diabetes, hypertension, cardiac disease, A. fib, MI, coronary artery disease, GI bleed. PMD: Dr. Leretha Pol [facility]    Past Medical History:  Diagnosis Date  . Diverticula of colon   . GERD (gastroesophageal reflux disease)   . Hyperlipidemia   . Renal insufficiency    2009 or 2010    Past Surgical History:  Procedure Laterality Date  . APPENDECTOMY    . CESAREAN SECTION    . ECTOPIC PREGNANCY SURGERY      Family History  Problem Relation Age of Onset  . Breast cancer Paternal Aunt   . Heart attack Mother     Social History  Substance Use Topics  . Smoking status: Current Every Day Smoker    Years: 50.00    Types: Cigarettes  . Smokeless tobacco: Never Used   . Alcohol use Yes    No current facility-administered medications for this encounter.   Current Outpatient Prescriptions:  .  aspirin EC 81 MG tablet, Take 1 tablet (81 mg total) by mouth daily., Disp: 90 tablet, Rfl: 3 .  budesonide-formoterol (SYMBICORT) 160-4.5 MCG/ACT inhaler, Inhale 2 puffs into the lungs 2 (two) times daily., Disp: , Rfl:  .  omeprazole (PRILOSEC) 20 MG capsule, Take 20 mg by mouth daily., Disp: , Rfl:  .  albuterol (PROVENTIL HFA;VENTOLIN HFA) 108 (90 Base) MCG/ACT inhaler, Inhale 2 puffs into the lungs every 4 (four) hours as needed for wheezing or shortness of breath. Dispense with aerochamber, Disp: 1 Inhaler, Rfl: 0 .  atorvastatin (LIPITOR) 80 MG tablet, Take 1 tablet (80 mg total) by mouth daily., Disp: 30 tablet, Rfl: 6 .  doxycycline (VIBRAMYCIN) 100 MG capsule, Take 1 capsule (100 mg total) by mouth 2 (two) times daily. X 7 days, Disp: 14 capsule, Rfl: 0 .  ibuprofen (ADVIL,MOTRIN) 800 MG tablet, Take 1 tablet (800 mg total) by mouth every 8 (eight) hours as needed., Disp: 21 tablet, Rfl: 0 .  metoprolol succinate (TOPROL XL) 25 MG 24 hr tablet, Take 1 tablet (25 mg total) by mouth daily., Disp: 30 tablet, Rfl: 6 .  nitroGLYCERIN (NITROSTAT) 0.4 MG SL tablet, Place 1 tablet (0.4 mg total) under the tongue every 5 (five) minutes as needed., Disp: 25 tablet, Rfl: 6 .  predniSONE (DELTASONE) 50 MG tablet, Take  1 tablet (50 mg total) by mouth daily with breakfast., Disp: 5 tablet, Rfl: 0 .  Spacer/Aero-Holding Chambers (AEROCHAMBER PLUS) inhaler, Use as instructed, Disp: 1 each, Rfl: 2  Allergies  Allergen Reactions  . Penicillins Nausea And Vomiting  . Shellfish Allergy Nausea And Vomiting  . Sulfa Antibiotics Rash  . Biaxin [Clarithromycin] Rash  . Iodinated Diagnostic Agents Other (See Comments)    acute renal insufficiency  . Macrobid [Nitrofurantoin] Rash     ROS  As noted in HPI.   Physical Exam  BP 136/79 (BP Location: Left Arm)   Pulse 95    Temp 98.4 F (36.9 C) (Oral)   Resp 16   Ht 5\' 2"  ([id number] m)   Wt 150 lb (68 kg)   SpO2 100%   BMI 27.44 kg/m   Constitutional: Well developed, well nourished, no acute distress Eyes:  PERRLA, EOMI, conjunctiva normal bilaterally no photophobia, HENT: Normocephalic, atraumatic,mucus membranes moist. positive tenderness over the masseter muscles and along the temples, TMs normal bilaterally. External ear canals normal bilaterally. No TMJ tenderness, crepitus. Positive nasal congestion, normal turbinates. Mild maxillary sinus tenderness. Slightly erythematous oropharynx, normal tonsils, uvula midline. Positive postnasal drip.  Respiratory: Normal inspiratory effort, wheezing, rhonchi throughout  Cardiovascular: Normal rate regular rhythm no murmurs rubs gallops GI: nondistended skin: No rash, skin intact Musculoskeletal: no deformities Neurologic: Alert & oriented x 3, no focal neuro deficits Psychiatric: Speech and behavior appropriate   ED Course   Medications  ipratropium-albuterol (DUONEB) 0.5-2.5 (3) MG/3ML nebulizer solution 3 mL (3 mLs Nebulization Given 10/16/16 1818)    Orders Placed This Encounter  Procedures  . Rapid Influenza A&B Antigens ([facility] only)    Standing Status:   Standing    Number of Occurrences:   1  . DG Chest 2 View    Standing Status:   Standing    Number of Occurrences:   1    Order Specific Question:   Reason for Exam (SYMPTOM  OR DIAGNOSIS REQUIRED)    Answer:   cough r/o COPD PNA  . Droplet precaution    Standing Status:   Standing    Number of Occurrences:   1    Results for orders placed or performed during the hospital encounter of 10/16/16 (from the past 24 hour(s))  Rapid Influenza A&B Antigens ([facility] only)     Status: None   Collection Time: 10/16/16  6:16 PM  Result Value Ref Range   Influenza A ([facility]) NEGATIVE NEGATIVE   Influenza B ([facility]) NEGATIVE NEGATIVE   Dg Chest 2 View  Result Date: 10/16/2016 CLINICAL DATA:  Cough for several  months. Congestion, chills and body aches for 3 days. Concern for pneumonia or COPD. EXAM: CHEST  2 VIEW COMPARISON:  Radiographs 05/09/2016.  CT 05/28/2016 FINDINGS: Lower lobe bronchiectasis on prior CT not as well appreciated radiographically. Mild apical emphysema on CT not seen radiographically. No focal airspace disease, pulmonary edema, pleural fluid or pneumothorax. Normal degree of inflation. No acute osseous abnormalities. IMPRESSION: 1. No acute abnormality.  No evidence of pneumonia. 2. Bronchiectasis and mild emphysema on prior CT are not as well characterized radiographically. Electronically Signed   By: Jeb Levering M.D.   On: 10/16/2016 18:49    ED Clinical Impression  Bronchitis  Acute maxillary sinusitis, recurrence not specified   ED Assessment/Plan  Feel that the headache is most likely from overuse of her chewing muscles. Doubt meningitis. She appears to have a sinusitis given the sinus tenderness. Suspect that she  has COPD given her long history of smoking, checking chest x-ray to rule out pneumonia. Giving DuoNeb here. We'll also check flu although discussed with patient she is out of the window for Tamiflu.   Presentation most consistent with a sinusitis, URI with a resulting bronchitis.   Reviewed imaging independently. No pneumonia. See radiology report for details.  Plan to send home with albuterol with spacer, prednisone 50 mg 5 days, doxycycline 100 mg twice a day for 7 days which will also cover a pneumonia/COPD exacerbation/bronchitis and a sinusitis.  short course of Tylenol/ibuprofen as needed for headaches and body aches. he will follow-up with her primary care physician in 2 days regardless of how she feels. To the ER if she gets worse.  Discussed imaging, MDM, plan and followup with patient. Discussed sn/sx that should prompt return to the ED. Patient agrees with plan.   Meds ordered this encounter  Medications  . ipratropium-albuterol (DUONEB) 0.5-2.5  (3) MG/3ML nebulizer solution 3 mL  . doxycycline (VIBRAMYCIN) 100 MG capsule    Sig: Take 1 capsule (100 mg total) by mouth 2 (two) times daily. X 7 days    Dispense:  14 capsule    Refill:  0  . ibuprofen (ADVIL,MOTRIN) 800 MG tablet    Sig: Take 1 tablet (800 mg total) by mouth every 8 (eight) hours as needed.    Dispense:  21 tablet    Refill:  0  . predniSONE (DELTASONE) 50 MG tablet    Sig: Take 1 tablet (50 mg total) by mouth daily with breakfast.    Dispense:  5 tablet    Refill:  0  . albuterol (PROVENTIL HFA;VENTOLIN HFA) 108 (90 Base) MCG/ACT inhaler    Sig: Inhale 2 puffs into the lungs every 4 (four) hours as needed for wheezing or shortness of breath. Dispense with aerochamber    Dispense:  1 Inhaler    Refill:  0  . Spacer/Aero-Holding Chambers (AEROCHAMBER PLUS) inhaler    Sig: Use as instructed    Dispense:  1 each    Refill:  2    *This clinic note was created using Dragon dictation software. Therefore, there may be occasional mistakes despite careful proofreading.  ?   Melynda Ripple, MD 10/22/16 1009

## 2016-10-16 NOTE — Discharge Instructions (Signed)
Take the medication as written.  You may take 800 mg of motrin with 1 gram of tylenol up to 3 times a day as needed for pain. This is an effective combination for pain. Use a neti pot or the NeilMed sinus rinse as often as you want to to reduce nasal congestion. Follow the directions on the box.   2 puffs from your albuterol inhaler using the spacer every 4-6 hours. Finish the prednisone and the antibiotics. This will also cover a sinus infection in addition to a bronchitis/COPD exacerbation.  Go to www.goodrx.com to look up your medications. This will give you a list of where you can find your prescriptions at the most affordable prices.

## 2016-10-19 ENCOUNTER — Telehealth: Payer: Self-pay

## 2016-10-19 NOTE — Telephone Encounter (Signed)
Courtesy call back completed today after patient's visit at [facility]. Patient improved and will call back with any questions or concerns.  

## 2016-12-18 ENCOUNTER — Other Ambulatory Visit: Payer: Self-pay | Admitting: Nurse Practitioner

## 2016-12-18 DIAGNOSIS — Z1231 Encounter for screening mammogram for malignant neoplasm of breast: Secondary | ICD-10-CM

## 2017-01-15 ENCOUNTER — Ambulatory Visit
Admission: RE | Admit: 2017-01-15 | Discharge: 2017-01-15 | Disposition: A | Discharge status: Home / Self Care | Payer: 59 | Source: Ambulatory Visit | Attending: Nurse Practitioner | Admitting: Nurse Practitioner

## 2017-01-15 DIAGNOSIS — Z1231 Encounter for screening mammogram for malignant neoplasm of breast: Secondary | ICD-10-CM | POA: Insufficient documentation

## 2017-03-18 ENCOUNTER — Ambulatory Visit (INDEPENDENT_AMBULATORY_CARE_PROVIDER_SITE_OTHER): Payer: 59 | Admitting: Cardiology

## 2017-03-18 ENCOUNTER — Encounter: Payer: Self-pay | Admitting: Cardiology

## 2017-03-18 VITALS — BP 122/72 | HR 79 | Ht 62.0 in | Wt 154.0 lb

## 2017-03-18 DIAGNOSIS — E784 Other hyperlipidemia: Secondary | ICD-10-CM

## 2017-03-18 DIAGNOSIS — E7849 Other hyperlipidemia: Secondary | ICD-10-CM

## 2017-03-18 DIAGNOSIS — I25118 Atherosclerotic heart disease of native coronary artery with other forms of angina pectoris: Secondary | ICD-10-CM

## 2017-03-18 DIAGNOSIS — F17200 Nicotine dependence, unspecified, uncomplicated: Secondary | ICD-10-CM

## 2017-03-18 NOTE — Patient Instructions (Addendum)
Labwork: We need records from your primary care provider along with your lipid profile and medication list.   Follow-Up: Your physician wants you to follow-up in: 6 months. You will receive a reminder letter in the mail two months in advance. If you don't receive a letter, please call our office to schedule the follow-up appointment.  It was a pleasure seeing you today here in the office. Please do not hesitate to give [place] a call back if you have any further questions. [place], BSN

## 2017-03-18 NOTE — Progress Notes (Signed)
Cardiology Office Note   Date:  03/18/2017   ID:  Stephanie Hess, DOB 16-Jan-1951, MRN [id number]  Referring Doctor:  Lavera Guise, MD   Cardiologist:   Wende Bushy, MD   Reason for consultation:  Chief Complaint  Patient presents with  . other    6 month follow up. Meds reviewed verbally with patient.       History of Present Illness: Stephanie Hess is a 66 y.o. female who presents For follow-up for atherosclerosis, hyperlipidemia  Since last visit, patient has been doing okay. No chest pains or shortness of breath.  She is not exactly sure why she is no longer taking atorvastatin. She is not 100% sure that she had a rash because of it. She recalls stopping it and then resuming aunt and then was switched to some other medication which she no longer takes. She does not recall exactly her last blood work for cholesterol with PCP was.  No PND, orthopnea, edema.   ROS:  Please see the history of present illness. Aside from mentioned under HPI, all other systems are reviewed and negative.       Past Medical History:  Diagnosis Date  . Diverticula of colon   . GERD (gastroesophageal reflux disease)   . Hyperlipidemia   . Renal insufficiency    2009 or 2010    Past Surgical History:  Procedure Laterality Date  . APPENDECTOMY    . CESAREAN SECTION    . ECTOPIC PREGNANCY SURGERY       reports that she has been smoking Cigarettes.  She has smoked for the past 50.00 years. She has never used smokeless tobacco. She reports that she drinks alcohol. She reports that she does not use drugs.   family history includes Breast cancer in her paternal aunt; Heart attack in her mother. CAD in the family, late onset, 22s in the brother, 80s and the mother.  Outpatient Medications Prior to Visit  Medication Sig Dispense Refill  . albuterol (PROVENTIL HFA;VENTOLIN HFA) 108 (90 Base) MCG/ACT inhaler Inhale 2 puffs into the lungs every 4 (four) hours as needed for wheezing or  shortness of breath. Dispense with aerochamber 1 Inhaler 0  . aspirin EC 81 MG tablet Take 1 tablet (81 mg total) by mouth daily. 90 tablet 3  . budesonide-formoterol (SYMBICORT) 160-4.5 MCG/ACT inhaler Inhale 2 puffs into the lungs 2 (two) times daily.    Marland Kitchen doxycycline (VIBRAMYCIN) 100 MG capsule Take 1 capsule (100 mg total) by mouth 2 (two) times daily. X 7 days 14 capsule 0  . ibuprofen (ADVIL,MOTRIN) 800 MG tablet Take 1 tablet (800 mg total) by mouth every 8 (eight) hours as needed. 21 tablet 0  . metoprolol succinate (TOPROL XL) 25 MG 24 hr tablet Take 1 tablet (25 mg total) by mouth daily. 30 tablet 6  . nitroGLYCERIN (NITROSTAT) 0.4 MG SL tablet Place 1 tablet (0.4 mg total) under the tongue every 5 (five) minutes as needed. 25 tablet 6  . omeprazole (PRILOSEC) 20 MG capsule Take 20 mg by mouth daily.    Marland Kitchen Spacer/Aero-Holding Chambers (AEROCHAMBER PLUS) inhaler Use as instructed 1 each 2  . atorvastatin (LIPITOR) 80 MG tablet Take 1 tablet (80 mg total) by mouth daily. 30 tablet 6  . predniSONE (DELTASONE) 50 MG tablet Take 1 tablet (50 mg total) by mouth daily with breakfast. (Patient not taking: Reported on 03/18/2017) 5 tablet 0   No facility-administered medications prior to visit.  Allergies: Penicillins; Shellfish allergy; Sulfa antibiotics; Biaxin [clarithromycin]; Iodinated diagnostic agents; Atorvastatin; and Macrobid [nitrofurantoin]    PHYSICAL EXAM: VS:  BP 122/72 (BP Location: Left Arm, Patient Position: Sitting, Cuff Size: Normal)   Pulse 79   Ht 5\' 2"  (1.575 m)   Wt 154 lb (69.9 kg)   BMI 28.17 kg/m  , Body mass index is 28.17 kg/m. Wt Readings from Last 3 Encounters:  03/18/17 154 lb (69.9 kg)  10/16/16 150 lb (68 kg)  07/04/16 149 lb 12.8 oz (67.9 kg)    GENERAL:  well developed, well nourished, obese, not in acute distress HEENT: normocephalic, pink conjunctivae, anicteric sclerae, no xanthelasma, normal dentition, oropharynx clear NECK:  no neck vein  engorgement, JVP normal, no hepatojugular reflux, carotid upstroke brisk and symmetric, no bruit, no thyromegaly, no lymphadenopathy LUNGS:  good respiratory effort, clear to auscultation bilaterally CV:  PMI not displaced, no thrills, no lifts, S1 and S2 within normal limits, no palpable S3 or S4, no murmurs, no rubs, no gallops ABD:  Soft, nontender, nondistended, normoactive bowel sounds, no abdominal aortic bruit, no hepatomegaly, no splenomegaly MS: nontender back, no kyphosis, no scoliosis, no joint deformities EXT:  2+ DP/PT pulses, no edema, no varicosities, no cyanosis, no clubbing SKIN: warm, nondiaphoretic, normal turgor, no ulcers NEUROPSYCH: alert, oriented to person, place, and time, sensory/motor grossly intact, normal mood, appropriate affect    Recent Labs: 05/31/2016: Hemoglobin 14.2; Platelets 284; TSH 2.254 08/12/2016: ALT 33; BUN 10; Creatinine, Ser 0.90; Potassium 3.9; Sodium 139   Lipid Panel    Component Value Date/Time   CHOL 228 (H) 08/12/2016 0849   CHOL 248 (H) 08/11/2013 0924   TRIG 245 (H) 08/12/2016 0849   TRIG 236 (H) 08/11/2013 0924   HDL 36 (L) 08/12/2016 0849   HDL 36 (L) 08/11/2013 0924   CHOLHDL 6.3 08/12/2016 0849   VLDL 49 (H) 08/12/2016 0849   VLDL 47 (H) 08/11/2013 0924   LDLCALC 143 (H) 08/12/2016 0849   LDLCALC 165 (H) 08/11/2013 9892     Other studies Reviewed:  EKG:  The ekg from 06/04/2016 was personally reviewed by me and it revealed sinus rhythm, 86 BPM  Additional studies/ records that were reviewed personally reviewed by me today include:  Echo 06/20/2016: - Left ventricle: The cavity size was normal. Wall thickness was   normal. Systolic function was normal. The estimated ejection   fraction was in the range of 55% to 60%. Wall motion was normal;   there were no regional wall motion abnormalities. Doppler   parameters are consistent with abnormal left ventricular   relaxation (grade 1 diastolic dysfunction).  Nuclear stress  test 06/11/2016: Exercise myocardial perfusion imaging study with no significant  ischemia Normal wall motion, EF estimated at 79% Very mild GI uptake artifact noted No EKG changes concerning for ischemia at peak stress or in recovery.  Patient achieved target heart rate, 86% of maximum predicted Exercised for 7 minutes, achieved 7 METS Low risk scan   ASSESSMENT AND PLAN: Coronary artery disease as noted on CT scan showing atherosclerotic plaque in the LAD and RCA. This was the incidental finding on a CT of the chest. Chest pain is somewhat atypical No ischemia noted on current EKG No evidence of ischemia on stress test. LVEF is normal. Continue medical therapy with aspirin 81 daily. We'll need to clarify as to what was the reaction to atorvastatin. If true allergy, may consider PCSK9 inhibitors (pending approval, will be very difficult to do) If intolerance,  may retry with simvastatin pravastatin nor rosuvastatin. Will await records from PCP. Prior to retry with statin, we'll need to recheck CMP and lipid panel.  Hyperlipidemia As discussed above, we'll need more information as to what was the reaction to the statin therapy.  Tobacco use We discussed the importance of smoking cessation and different strategies for quitting.     Current medicines are reviewed at length with the patient today.  The patient does not have concerns regarding medicines.  Labs/ tests ordered today include:  Orders Placed This Encounter  Procedures  . EKG 12-Lead    I had a lengthy and detailed discussion with the patient regarding diagnoses, prognosis, diagnostic options, treatment options , and side effects of medications.   I counseled the patient on importance of lifestyle modification including heart healthy diet, regular physical activity , and smoking cessation.   Disposition:   FU with undersigned in 6-9 months   I spent at least 25 minutes with the patient today and more than 50% of the  time was spent counseling the patient and coordinating care.    Signed, Wende Bushy, MD  03/18/2017 5:00 PM    [place]  This note was generated in part with voice recognition software and I apologize for any typographical errors that were not detected and corrected.

## 2017-05-02 ENCOUNTER — Encounter: Payer: Self-pay | Admitting: Obstetrics and Gynecology

## 2017-05-02 ENCOUNTER — Ambulatory Visit (INDEPENDENT_AMBULATORY_CARE_PROVIDER_SITE_OTHER): Payer: 59 | Admitting: Obstetrics and Gynecology

## 2017-05-02 ENCOUNTER — Other Ambulatory Visit: Payer: Self-pay | Admitting: Obstetrics and Gynecology

## 2017-05-02 VITALS — BP 135/87 | HR 82 | Ht 62.0 in | Wt 154.2 lb

## 2017-05-02 DIAGNOSIS — J439 Emphysema, unspecified: Secondary | ICD-10-CM | POA: Diagnosis not present

## 2017-05-02 DIAGNOSIS — E782 Mixed hyperlipidemia: Secondary | ICD-10-CM

## 2017-05-02 DIAGNOSIS — Z01419 Encounter for gynecological examination (general) (routine) without abnormal findings: Secondary | ICD-10-CM

## 2017-05-02 DIAGNOSIS — F17200 Nicotine dependence, unspecified, uncomplicated: Secondary | ICD-10-CM

## 2017-05-02 MED ORDER — CLORAZEPATE DIPOTASSIUM 7.5 MG PO TABS: 7.5000 mg | ORAL_TABLET | Freq: Every evening | ORAL | 3 refills | Status: DC | PRN

## 2017-05-02 NOTE — Patient Instructions (Signed)
Bronchiectasis Bronchiectasis is a condition in which the airways (bronchi) are damaged and widened. This makes it difficult for the lungs to get rid of mucus. As a result, mucus gathers in the airways, and this often leads to lung infections. Infection can cause inflammation in the airways, which may further weaken and damage the bronchi. What are the causes? Bronchiectasis may be present at birth (congenital) or may develop later in life. Sometimes there is no apparent cause. Some common causes include:  Cystic fibrosis.  Recurrent lung infections (such as pneumonia, tuberculosis, or fungal infections).  Foreign bodies or other blockages in the lungs.  Breathing in fluid, food, or other foreign objects (aspiration).  What are the signs or symptoms? Common symptoms include:  A daily cough that brings up mucus and lasts for more than 3 weeks.  Frequent lung infections (such as pneumonia, tuberculosis, or fungal infections).  Shortness of breath and wheezing.  Weakness and fatigue.  How is this diagnosed? Various tests may be done to help diagnose bronchiectasis. Tests may include:  Chest X-rays or CT scans.  Breathing tests to help determine how your lungs are working.  Sputum cultures to check for infection.  Blood tests and other tests to check for related diseases or causes, such as cystic fibrosis.  How is this treated? Treatment varies depending on the severity of the condition. Medicines may be given to loosen the mucus to be coughed up (expectorants), to relax the muscles of the air passages (bronchodilators), or to prevent or treat infections (antibiotics). Physical therapy methods may be recommended to help clear mucus from the lungs. For severe cases, surgery may be done to remove the affected part of the lung. Follow these instructions at home:  Get plenty of rest.  Only take over-the-counter or prescription medicines as directed by your health care provider. If  antibiotic medicines were prescribed, take them as directed. Finish them even if you start to feel better.  Avoid sedatives and antihistamines unless otherwise directed by your health care provider. These medicines tend to thicken the mucus in the lungs.  Perform any breathing exercises or techniques to clear the lungs as directed by your health care provider.  Drink enough fluids to keep your urine clear or pale yellow.  Consider using a cold steam vaporizer or humidifier in your room or home to help loosen secretions.  If the cough is worse at night, try sleeping in a semi-upright position in a recliner or using a couple of pillows.  Avoid cigarette smoke and lung irritants. If you smoke, quit.  Stay inside when pollution and ozone levels are high.  Stay current with vaccinations and immunizations.  Follow up with your health care provider as directed. Contact a health care provider if:  You cough up more thick, discolored mucus (sputum) that is yellow to green in color.  You have a fever or persistent symptoms for more than 2-3 days.  You cannot control your cough and are losing sleep. Get help right away if:  You cough up blood.  You have chest pain or increasing shortness of breath.  You have pain that is getting worse or is uncontrolled with medicines.  You have a fever and your symptoms suddenly get worse. This information is not intended to replace advice given to you by your health care provider. Make sure you discuss any questions you have with your health care provider. Document Released: 08/25/2007 Document Revised: 04/10/2016 Document Reviewed: 05/05/2013 Elsevier Interactive Patient Education  2017 Elsevier   Inc.  

## 2017-05-02 NOTE — Progress Notes (Signed)
Subjective:   Stephanie Hess is a 66 y.o. G47P2 Caucasian female here for a routine well-woman exam.  No LMP recorded. Patient is postmenopausal.   No menses in 16 years. Current complaints: abdominal pains with flare of diverticulitis. Needs refill on sleep medacine PCP: Humphrey Rolls       does desire labs, also desires switching cholesterol med to crestor as she took it in the past and tolerated it better.  Social History: Sexual: heterosexual Marital Status: divorced Living situation: with family Occupation: Astronomer Tobacco/alcohol: tobacco use: Smoked 1 packs per day for 50 years Illicit drugs: no history of illicit drug use  The following portions of the patient's history were reviewed and updated as appropriate: allergies, current medications, past family history, past medical history, past social history, past surgical history and problem list.  Past Medical History Past Medical History:  Diagnosis Date  . COPD (chronic obstructive pulmonary disease) ([place])   . Diverticula of colon   . GERD (gastroesophageal reflux disease)   . Hyperlipidemia   . Renal insufficiency    2009 or 2010    Past Surgical History Past Surgical History:  Procedure Laterality Date  . APPENDECTOMY    . CESAREAN SECTION    . ECTOPIC PREGNANCY SURGERY      Gynecologic History G2P2  No LMP recorded. Patient is postmenopausal. Contraception: post menopausal status Last Pap: 2015. Results were: normal Last mammogram: 01/2017. Results were: normal   Obstetric History OB History  Gravida Para Term Preterm AB Living  2 2          SAB TAB Ectopic Multiple Live Births               # Outcome Date GA Lbr Len/2nd Weight Sex Delivery Anes PTL Lv  2 Para 1989    F CS-Unspec     1 Para 1985    F CS-Unspec         Current Medications Current Outpatient Prescriptions on File Prior to Visit  Medication Sig Dispense Refill  . albuterol (PROVENTIL HFA;VENTOLIN HFA) 108 (90 Base) MCG/ACT inhaler  Inhale 2 puffs into the lungs every 4 (four) hours as needed for wheezing or shortness of breath. Dispense with aerochamber 1 Inhaler 0  . budesonide-formoterol (SYMBICORT) 160-4.5 MCG/ACT inhaler Inhale 2 puffs into the lungs 2 (two) times daily.    Marland Kitchen ibuprofen (ADVIL,MOTRIN) 800 MG tablet Take 1 tablet (800 mg total) by mouth every 8 (eight) hours as needed. 21 tablet 0  . nitroGLYCERIN (NITROSTAT) 0.4 MG SL tablet Place 1 tablet (0.4 mg total) under the tongue every 5 (five) minutes as needed. 25 tablet 6  . omeprazole (PRILOSEC) 20 MG capsule Take 20 mg by mouth daily.    Marland Kitchen Spacer/Aero-Holding Chambers (AEROCHAMBER PLUS) inhaler Use as instructed 1 each 2  . aspirin EC 81 MG tablet Take 1 tablet (81 mg total) by mouth daily. (Patient not taking: Reported on 05/02/2017) 90 tablet 3  . metoprolol succinate (TOPROL XL) 25 MG 24 hr tablet Take 1 tablet (25 mg total) by mouth daily. (Patient not taking: Reported on 05/02/2017) 30 tablet 6   No current facility-administered medications on file prior to visit.     Review of Systems Patient denies any headaches, blurred vision, shortness of breath, chest pain, abdominal pain, problems with bowel movements, urination, or intercourse.  Objective:  BP 135/87   Pulse 82   Ht 5\' 2"  (1.575 m)   Wt 154 lb 3.2 oz (69.9 kg)  BMI 28.20 kg/m  Physical Exam  General:  Well developed, well nourished, no acute distress. She is alert and oriented x3. Skin:  Warm and dry Neck:  Midline trachea, no thyromegaly or nodules Cardiovascular: Regular rate and rhythm, no murmur heard Lungs:  Effort normal, all lung fields clear to auscultation bilaterally Breasts:  No dominant palpable mass, retraction, or nipple discharge Abdomen:  Soft, non tender, no hepatosplenomegaly or masses Pelvic:  External genitalia is normal in appearance.  The vagina is normal in appearance. The cervix is bulbous, no CMT.  Thin prep pap is done with HR HPV cotesting. Uterus is felt to  be normal size, shape, and contour.  No adnexal masses or tenderness noted. Extremities:  No swelling or varicosities noted Psych:  She has a normal mood and affect  Assessment:   Healthy well-woman exam Mild ephysema Smoker-not interested in quiting Hyperlipidemia S/p menopause  Plan:  Will order to repeat CT of chest F/U 1 year for AE, or sooner if needed Mammogram not due til next year Colonoscopy done 3 years ago, not due now  Melody Rockney Ghee, CNM

## 2017-05-03 LAB — COMPREHENSIVE METABOLIC PANEL
ALBUMIN: 4.5 g/dL (ref 3.6–4.8)
ALT: 38 IU/L — ABNORMAL HIGH (ref 0–32)
AST: 34 IU/L (ref 0–40)
Albumin/Globulin Ratio: 1.4 (ref 1.2–2.2)
Alkaline Phosphatase: 79 IU/L (ref 39–117)
BUN / CREAT RATIO: 10 — AB (ref 12–28)
BUN: 7 mg/dL — AB (ref 8–27)
Bilirubin Total: <0.2 mg/dL (ref 0.0–1.2)
CALCIUM: 10.2 mg/dL (ref 8.7–10.3)
CO2: 21 mmol/L (ref 20–29)
CREATININE: 0.7 mg/dL (ref 0.57–1.00)
Chloride: 103 mmol/L (ref 96–106)
GFR calc Af Amer: 106 mL/min/{1.73_m2} (ref >59)
GFR, EST NON AFRICAN AMERICAN: 92 mL/min/{1.73_m2} (ref >59)
GLOBULIN, TOTAL: 3.3 g/dL (ref 1.5–4.5)
Glucose: 81 mg/dL (ref 65–99)
Potassium: 4.2 mmol/L (ref 3.5–5.2)
Sodium: 140 mmol/L (ref 134–144)
Total Protein: 7.8 g/dL (ref 6.0–8.5)

## 2017-05-03 LAB — NMR, LIPOPROFILE
Cholesterol: 238 mg/dL — ABNORMAL HIGH (ref 100–199)
HDL CHOLESTEROL BY NMR: 35 mg/dL — AB (ref >39)
HDL Particle Number: 23.8 umol/L — ABNORMAL LOW (ref >=30.5)
LDL PARTICLE NUMBER: 2398 nmol/L — AB (ref <1000)
LDL Size: 20.4 nm (ref >20.5)
LDL-C: 125 mg/dL — AB (ref 0–99)
LP-IR Score: 84 — ABNORMAL HIGH (ref <=45)
Small LDL Particle Number: 1341 nmol/L — ABNORMAL HIGH (ref <=527)
Triglycerides by NMR: 389 mg/dL — ABNORMAL HIGH (ref 0–149)

## 2017-05-03 LAB — CBC
HEMOGLOBIN: 14.3 g/dL (ref 11.1–15.9)
Hematocrit: 40.7 % (ref 34.0–46.6)
MCH: 31 pg (ref 26.6–33.0)
MCHC: 35.1 g/dL (ref 31.5–35.7)
MCV: 88 fL (ref 79–97)
Platelets: 302 10*3/uL (ref 150–379)
RBC: 4.61 x10E6/uL (ref 3.77–5.28)
RDW: 13.8 % (ref 12.3–15.4)
WBC: 9.5 10*3/uL (ref 3.4–10.8)

## 2017-05-03 LAB — VITAMIN D 25 HYDROXY (VIT D DEFICIENCY, FRACTURES): Vit D, 25-Hydroxy: 17 ng/mL — ABNORMAL LOW (ref 30.0–100.0)

## 2017-05-03 LAB — FERRITIN: FERRITIN: 42 ng/mL (ref 15–150)

## 2017-05-05 ENCOUNTER — Telehealth: Payer: Self-pay | Admitting: Obstetrics and Gynecology

## 2017-05-05 ENCOUNTER — Other Ambulatory Visit: Payer: Self-pay | Admitting: *Deleted

## 2017-05-05 MED ORDER — CLORAZEPATE DIPOTASSIUM 7.5 MG PO TABS: 7.5000 mg | ORAL_TABLET | Freq: Every evening | ORAL | 3 refills | Status: DC | PRN

## 2017-05-05 NOTE — Telephone Encounter (Signed)
°  Patient lvm stating that she is not able to pick up her prescription at the [facility] in [place], and needs the prescription possibly written out. Patient would like to speak to someone in regards to the issue, and patient did not disclose any further information. Please advise.

## 2017-05-05 NOTE — Telephone Encounter (Signed)
Done-ac 

## 2017-05-08 ENCOUNTER — Telehealth: Payer: Self-pay | Admitting: Gastroenterology

## 2017-05-08 NOTE — Telephone Encounter (Signed)
Patient called PCP and they referred her back to [place]. She is having abdominal pain again and thinks her diverticulitis is back. Please call patient.

## 2017-05-09 LAB — CYTOLOGY - PAP

## 2017-05-15 ENCOUNTER — Other Ambulatory Visit: Payer: Self-pay | Admitting: Obstetrics and Gynecology

## 2017-05-15 DIAGNOSIS — E782 Mixed hyperlipidemia: Secondary | ICD-10-CM | POA: Insufficient documentation

## 2017-05-15 DIAGNOSIS — E785 Hyperlipidemia, unspecified: Secondary | ICD-10-CM | POA: Insufficient documentation

## 2017-05-15 DIAGNOSIS — E559 Vitamin D deficiency, unspecified: Secondary | ICD-10-CM | POA: Insufficient documentation

## 2017-05-15 MED ORDER — VITAMIN D (ERGOCALCIFEROL) 1.25 MG (50000 UNIT) PO CAPS: 50000.0000 [IU] | ORAL_CAPSULE | ORAL | 2 refills | Status: DC

## 2017-05-15 NOTE — Telephone Encounter (Signed)
-----   Message from Joylene Igo, [place] sent at 05/15/2017  1:15 PM EDT ----- Please let her know lab results. Vit D is very low, please mail info, also let her know I sent in a prescription for 2 times a week supplement. Cholesterol is really high too, but I see she has an allergy to statins. See if she has tried other cholesterol meds and have her follow up with PCP.

## 2017-06-04 ENCOUNTER — Ambulatory Visit
Admission: EM | Admit: 2017-06-04 | Discharge: 2017-06-04 | Disposition: A | Discharge status: Home / Self Care | Payer: 59 | Attending: Family Medicine | Admitting: Family Medicine

## 2017-06-04 DIAGNOSIS — B029 Zoster without complications: Secondary | ICD-10-CM

## 2017-06-04 MED ORDER — HYDROCODONE-ACETAMINOPHEN 5-325 MG PO TABS: ORAL_TABLET | ORAL | 0 refills | Status: DC

## 2017-06-04 MED ORDER — VALACYCLOVIR HCL 1 G PO TABS: 1000.0000 mg | ORAL_TABLET | Freq: Three times a day (TID) | ORAL | 0 refills | Status: DC

## 2017-06-04 NOTE — ED Triage Notes (Signed)
Patient complains of left leg pain that started last week. Patient states that pain originated in her buttock and has been radiating down her leg. Patient states that pain has been constant.

## 2017-06-16 ENCOUNTER — Other Ambulatory Visit: Payer: Self-pay | Admitting: Internal Medicine

## 2017-06-16 DIAGNOSIS — R911 Solitary pulmonary nodule: Secondary | ICD-10-CM

## 2017-06-24 ENCOUNTER — Ambulatory Visit
Admission: RE | Admit: 2017-06-24 | Discharge: 2017-06-24 | Disposition: A | Discharge status: Home / Self Care | Payer: 59 | Source: Ambulatory Visit | Attending: Internal Medicine | Admitting: Internal Medicine

## 2017-06-24 DIAGNOSIS — J439 Emphysema, unspecified: Secondary | ICD-10-CM | POA: Insufficient documentation

## 2017-06-24 DIAGNOSIS — I700 Atherosclerosis of aorta: Secondary | ICD-10-CM | POA: Diagnosis not present

## 2017-06-24 DIAGNOSIS — R911 Solitary pulmonary nodule: Secondary | ICD-10-CM

## 2017-06-24 DIAGNOSIS — I2510 Atherosclerotic heart disease of native coronary artery without angina pectoris: Secondary | ICD-10-CM | POA: Diagnosis not present

## 2017-06-24 DIAGNOSIS — J479 Bronchiectasis, uncomplicated: Secondary | ICD-10-CM | POA: Diagnosis not present

## 2017-06-24 HISTORY — DX: Malignant (primary) neoplasm, unspecified: C80.1

## 2017-06-24 LAB — POCT I-STAT CREATININE: Creatinine, Ser: 0.7 mg/dL (ref 0.44–1.00)

## 2017-06-24 MED ORDER — IOPAMIDOL (ISOVUE-300) INJECTION 61%: 75.0000 mL | Freq: Once | INTRAVENOUS | Status: DC | PRN

## 2017-08-17 NOTE — ED Provider Notes (Signed)
[facility]    CSN: [id number] Arrival date & time: 06/04/17  6606     History   Chief Complaint Chief Complaint  Patient presents with  . Leg Pain    left    HPI Stephanie Hess is a 66 y.o. female.   66 yo female with a c/o left leg pain for the past week, starting from the buttock area and radiating down the leg. Pain is "burning" and also slight rash to low back area.    The history is provided by the patient.  Leg Pain    Past Medical History:  Diagnosis Date  . Cancer ([place])   . COPD (chronic obstructive pulmonary disease) ([place])   . Diverticula of colon   . GERD (gastroesophageal reflux disease)   . Hyperlipidemia   . Renal insufficiency    2009 or 2010    Patient Active Problem List   Diagnosis Date Noted  . Hyperlipidemia 05/15/2017  . Vitamin D deficiency 05/15/2017    Past Surgical History:  Procedure Laterality Date  . APPENDECTOMY    . CESAREAN SECTION    . ECTOPIC PREGNANCY SURGERY      OB History    Gravida Para Term Preterm AB Living   2 2           SAB TAB Ectopic Multiple Live Births                   Home Medications    Prior to Admission medications   Medication Sig Start Date End Date Taking? Authorizing Provider  aspirin EC 81 MG tablet Take 1 tablet (81 mg total) by mouth daily. 06/04/16  Yes Wende Bushy, MD  clorazepate (TRANXENE) 7.5 MG tablet Take 1 tablet (7.5 mg total) by mouth at bedtime as needed for anxiety. 05/05/17  Yes Shambley, Melody N, CNM  ibuprofen (ADVIL,MOTRIN) 800 MG tablet Take 1 tablet (800 mg total) by mouth every 8 (eight) hours as needed. 10/16/16  Yes Melynda Ripple, MD  metoprolol succinate (TOPROL XL) 25 MG 24 hr tablet Take 1 tablet (25 mg total) by mouth daily. 06/04/16  Yes Wende Bushy, MD  nitroGLYCERIN (NITROSTAT) 0.4 MG SL tablet Place 1 tablet (0.4 mg total) under the tongue every 5 (five) minutes as needed. 06/04/16  Yes Wende Bushy, MD  omeprazole (PRILOSEC) 20 MG capsule Take  20 mg by mouth daily.   Yes [provider]  Vitamin D, Ergocalciferol, (DRISDOL) 50000 units CAPS capsule Take 1 capsule (50,000 Units total) by mouth 2 (two) times a week. 05/15/17  Yes Shambley, Melody N, CNM  HYDROcodone-acetaminophen (NORCO/VICODIN) 5-325 MG tablet 1-2 tab bid prn 06/04/17   Norval Gable, MD  valACYclovir (VALTREX) 1000 MG tablet Take 1 tablet (1,000 mg total) by mouth 3 (three) times daily. 06/04/17   Norval Gable, MD    Family History Family History  Problem Relation Age of Onset  . Breast cancer Paternal Aunt   . Heart attack Mother     Social History Social History  Substance Use Topics  . Smoking status: Current Every Day Smoker    Packs/day: 0.50    Years: 50.00    Types: Cigarettes  . Smokeless tobacco: Never Used  . Alcohol use Yes     Allergies   Penicillins; Shellfish allergy; Sulfa antibiotics; Biaxin [clarithromycin]; Atorvastatin; and Macrobid [nitrofurantoin]   Review of Systems Review of Systems   Physical Exam Triage Vital Signs ED Triage Vitals  Enc Vitals Group  BP 06/04/17 0912 127/71     Pulse Rate 06/04/17 0912 88     Resp 06/04/17 0912 18     Temp 06/04/17 0912 97.9 F (36.6 C)     Temp Source 06/04/17 0912 Oral     SpO2 06/04/17 0912 96 %     Weight 06/04/17 0910 154 lb (69.9 kg)     Height 06/04/17 0910 5\' 2"  (1.575 m)     Head Circumference --      Peak Flow --      Pain Score 06/04/17 0910 7     Pain Loc --      Pain Edu? --      Excl. in [place]? --    No data found.   Updated Vital Signs BP 127/71 (BP Location: Right Arm)   Pulse 88   Temp 97.9 F (36.6 C) (Oral)   Resp 18   Ht 5\' 2"  (1.575 m)   Wt 154 lb (69.9 kg)   SpO2 96%   BMI 28.17 kg/m   Visual Acuity Right Eye Distance:   Left Eye Distance:   Bilateral Distance:    Right Eye Near:   Left Eye Near:    Bilateral Near:     Physical Exam  Constitutional: She appears well-developed and well-nourished. No distress.    Musculoskeletal:       Lumbar back: She exhibits tenderness.       Back:  Mild, erythematous, vesicular rash to low back/upper buttock area  Skin: She is not diaphoretic.  Nursing note and vitals reviewed.    UC Treatments / Results  Labs (all labs ordered are listed, but only abnormal results are displayed) Labs Reviewed - No data to display  EKG  EKG Interpretation None       Radiology No results found.  Procedures Procedures (including critical care time)  Medications Ordered in UC Medications - No data to display   Initial Impression / Assessment and Plan / UC Course  I have reviewed the triage vital signs and the nursing notes.  Pertinent labs & imaging results that were available during my care of the patient were reviewed by me and considered in my medical decision making (see chart for details).       Final Clinical Impressions(s) / UC Diagnoses   Final diagnoses:  Herpes zoster without complication    New Prescriptions Discharge Medication List as of 06/04/2017  9:59 AM    START taking these medications   Details  HYDROcodone-acetaminophen (NORCO/VICODIN) 5-325 MG tablet 1-2 tab bid prn, Print    valACYclovir (VALTREX) 1000 MG tablet Take 1 tablet (1,000 mg total) by mouth 3 (three) times daily., Starting Wed 06/04/2017, Normal       1. diagnosis reviewed with patient 2. rx as per orders above; reviewed possible side effects, interactions, risks and benefits  3. Recommend supportive treatment with otc analgesics prn 4. Follow-up prn if symptoms worsen or don't improve  Controlled Substance Prescriptions [place] Controlled Substance Registry consulted? No   Norval Gable, MD 08/17/17 [id number]

## 2017-09-08 ENCOUNTER — Encounter (INDEPENDENT_AMBULATORY_CARE_PROVIDER_SITE_OTHER): Payer: Self-pay

## 2017-09-08 ENCOUNTER — Ambulatory Visit (INDEPENDENT_AMBULATORY_CARE_PROVIDER_SITE_OTHER): Payer: 59 | Admitting: Gastroenterology

## 2017-09-08 ENCOUNTER — Encounter: Payer: Self-pay | Admitting: Gastroenterology

## 2017-09-08 ENCOUNTER — Other Ambulatory Visit: Payer: Self-pay

## 2017-09-08 VITALS — BP 112/67 | HR 108 | Ht 62.0 in | Wt 161.0 lb

## 2017-09-08 DIAGNOSIS — R1032 Left lower quadrant pain: Secondary | ICD-10-CM | POA: Diagnosis not present

## 2017-09-08 DIAGNOSIS — K5900 Constipation, unspecified: Secondary | ICD-10-CM

## 2017-09-08 NOTE — Progress Notes (Signed)
Primary Care Physician: Lavera Guise, MD  Primary Gastroenterologist:  Dr. Lucilla Lame  Chief Complaint  Patient presents with  . Diverticulitis    HPI: Stephanie Hess is a 66 y.o. female here for report of abdominal pain that happens usually once every month to every 2 months. Despite the patient reporting that she has fevers and chills when she has attacks of left lower quadrant pain she states that all resolves shortly after moving her bowels and then she is asymptomatic.  The patient reports that she has a bowel movement usually every day but has to take something to help her move her bowels when she has scribed above.  Current Outpatient Prescriptions  Medication Sig Dispense Refill  . aspirin EC 81 MG tablet Take 1 tablet (81 mg total) by mouth daily. 90 tablet 3  . clorazepate (TRANXENE) 7.5 MG tablet Take 1 tablet (7.5 mg total) by mouth at bedtime as needed for anxiety. 30 tablet 3  . Melatonin 10 MG CAPS Take by mouth.    . metoprolol succinate (TOPROL XL) 25 MG 24 hr tablet Take 1 tablet (25 mg total) by mouth daily. 30 tablet 6  . nitroGLYCERIN (NITROSTAT) 0.4 MG SL tablet Place 1 tablet (0.4 mg total) under the tongue every 5 (five) minutes as needed. 25 tablet 6  . omeprazole (PRILOSEC) 20 MG capsule Take 20 mg by mouth daily.    . Vitamin D, Ergocalciferol, (DRISDOL) 50000 units CAPS capsule Take 1 capsule (50,000 Units total) by mouth 2 (two) times a week. 30 capsule 2  . HYDROcodone-acetaminophen (NORCO/VICODIN) 5-325 MG tablet 1-2 tab bid prn (Patient not taking: Reported on 09/08/2017) 12 tablet 0  . ibuprofen (ADVIL,MOTRIN) 800 MG tablet Take 1 tablet (800 mg total) by mouth every 8 (eight) hours as needed. (Patient not taking: Reported on 09/08/2017) 21 tablet 0  . predniSONE (STERAPRED UNI-PAK 21 TAB) 10 MG (21) TBPK tablet TAKE BY MOUTH AS DIRECTED FOR 6 DAYS  0  . valACYclovir (VALTREX) 1000 MG tablet Take 1 tablet (1,000 mg total) by mouth 3 (three) times  daily. (Patient not taking: Reported on 09/08/2017) 21 tablet 0   No current facility-administered medications for this visit.     Allergies as of 09/08/2017 - Review Complete 09/08/2017  Allergen Reaction Noted  . Penicillins Nausea And Vomiting 03/01/2016  . Shellfish allergy Nausea And Vomiting 03/01/2016  . Sulfa antibiotics Rash 09/19/2014  . Biaxin [clarithromycin] Rash 03/01/2016  . Amoxicillin-pot clavulanate  09/21/2014  . Atorvastatin Rash 03/18/2017  . Macrobid [nitrofurantoin] Rash 05/06/2016    ROS:  General: Negative for anorexia, weight loss, fever, chills, fatigue, weakness. ENT: Negative for hoarseness, difficulty swallowing , nasal congestion. CV: Negative for chest pain, angina, palpitations, dyspnea on exertion, peripheral edema.  Respiratory: Negative for dyspnea at rest, dyspnea on exertion, cough, sputum, wheezing.  GI: See history of present illness. GU:  Negative for dysuria, hematuria, urinary incontinence, urinary frequency, nocturnal urination.  Endo: Negative for unusual weight change.    Physical Examination:   BP 112/67   Pulse (!) 108   Ht 5\' 2"  (1.575 m)   Wt 161 lb (73 kg)   BMI 29.45 kg/m   General: Well-nourished, well-developed in no acute distress.  Eyes: No icterus. Conjunctivae pink. Mouth: Oropharyngeal mucosa moist and pink , no lesions erythema or exudate. Lungs: Clear to auscultation bilaterally. Non-labored. Heart: Regular rate and rhythm, no murmurs rubs or gallops.  Abdomen: Bowel sounds are normal, nontender, nondistended, no  hepatosplenomegaly or masses, no abdominal bruits or hernia , no rebound or guarding.   Extremities: No lower extremity edema. No clubbing or deformities. Neuro: Alert and oriented x 3.  Grossly intact. Skin: Warm and dry, no jaundice.   Psych: Alert and cooperative, normal mood and affect.  Labs:    Imaging Studies: No results found.  Assessment and Plan:   Stephanie Hess is a 43 y.o. y/o  female With a history of intermittent abdominal pain once every 1 to 2 months.  Since the pain is relieved quickly by moving her bowels and taking a laxative the patient has been told to start fiber in her diet daily to see if she can mitigate the occurrence of these attacks.  The patient has been explained the plan and agrees with it.    Lucilla Lame, MD. Marval Regal   Note: This dictation was prepared with Dragon dictation along with smaller phrase technology. Any transcriptional errors that result from this process are unintentional.

## 2017-10-16 ENCOUNTER — Telehealth: Payer: Self-pay | Admitting: *Deleted

## 2017-10-16 ENCOUNTER — Other Ambulatory Visit: Payer: Self-pay | Admitting: *Deleted

## 2017-10-16 MED ORDER — CLORAZEPATE DIPOTASSIUM 7.5 MG PO TABS: 7.5000 mg | ORAL_TABLET | Freq: Every evening | ORAL | 3 refills | Status: DC | PRN

## 2017-10-16 NOTE — Telephone Encounter (Signed)
Patient called and states she needs a refill of her clorazepate. Patient pharmacy is [facility] in [place] . Please advise. Thank you

## 2017-10-17 NOTE — Telephone Encounter (Signed)
Done-ac 

## 2018-01-20 ENCOUNTER — Ambulatory Visit: Payer: Self-pay | Admitting: Internal Medicine

## 2018-03-03 ENCOUNTER — Encounter: Payer: Self-pay | Admitting: Internal Medicine

## 2018-03-03 ENCOUNTER — Ambulatory Visit: Payer: 59 | Admitting: Internal Medicine

## 2018-03-03 VITALS — BP 128/72 | HR 97 | Resp 16 | Ht 62.0 in | Wt 159.2 lb

## 2018-03-03 DIAGNOSIS — K219 Gastro-esophageal reflux disease without esophagitis: Secondary | ICD-10-CM | POA: Insufficient documentation

## 2018-03-03 DIAGNOSIS — J4520 Mild intermittent asthma, uncomplicated: Secondary | ICD-10-CM | POA: Diagnosis not present

## 2018-03-03 DIAGNOSIS — J8410 Pulmonary fibrosis, unspecified: Secondary | ICD-10-CM | POA: Insufficient documentation

## 2018-03-03 DIAGNOSIS — J470 Bronchiectasis with acute lower respiratory infection: Secondary | ICD-10-CM | POA: Diagnosis not present

## 2018-03-03 DIAGNOSIS — R911 Solitary pulmonary nodule: Secondary | ICD-10-CM

## 2018-03-03 DIAGNOSIS — Z720 Tobacco use: Secondary | ICD-10-CM | POA: Insufficient documentation

## 2018-03-03 MED ORDER — AZITHROMYCIN 250 MG PO TABS: ORAL_TABLET | ORAL | 0 refills | Status: DC

## 2018-03-03 NOTE — Patient Instructions (Signed)

## 2018-03-03 NOTE — Progress Notes (Signed)
[facility] [place], [place] [address]  Pulmonary Sleep Medicine   Office Visit Note  Patient Name: Stephanie Hess DOB: 28-Aug-1951 MRN [id number]  Date of Service: 03/03/2018  Complaints/HPI:  She is doing well except that she is having cough and some congestion.  She still continues to smoke.  She has had some allergy symptoms with nasal stuffiness and itchy eyes.  She is not really taking anything says she uses night well at nighttime.  She is not on any antihistamines or nasal sprays at this time  ROS  General: (-) fever, (-) chills, (-) night sweats, (-) weakness Skin: (-) rashes, (-) itching,. Eyes: (-) visual changes, (-) redness, (-) itching. Nose and Sinuses: (-) nasal stuffiness or itchiness, (-) postnasal drip, (-) nosebleeds, (-) sinus trouble. Mouth and Throat: (-) sore throat, (-) hoarseness. Neck: (-) swollen glands, (-) enlarged thyroid, (-) neck pain. Respiratory: + cough, (-) bloody sputum, + shortness of breath, - wheezing. Cardiovascular: - ankle swelling, (-) chest pain. Lymphatic: (-) lymph node enlargement. Neurologic: (-) numbness, (-) tingling. Psychiatric: (-) anxiety, (-) depression   Current Medication: Outpatient Encounter Medications as of 03/03/2018  Medication Sig  . aspirin EC 81 MG tablet Take 1 tablet (81 mg total) by mouth daily.  . budesonide-formoterol (SYMBICORT) 160-4.5 MCG/ACT inhaler Inhale 2 puffs into the lungs 2 (two) times daily.  . clorazepate (TRANXENE) 7.5 MG tablet Take 1 tablet (7.5 mg total) by mouth at bedtime as needed for anxiety.  Marland Kitchen ibuprofen (ADVIL,MOTRIN) 800 MG tablet Take 1 tablet (800 mg total) by mouth every 8 (eight) hours as needed.  Marland Kitchen omeprazole (PRILOSEC) 20 MG capsule Take 20 mg by mouth daily.  . Vitamin D, Ergocalciferol, (DRISDOL) 50000 units CAPS capsule Take 1 capsule (50,000 Units total) by mouth 2 (two) times a week.  . [DISCONTINUED] Melatonin 10 MG CAPS Take by mouth.  .  metoprolol succinate (TOPROL XL) 25 MG 24 hr tablet Take 1 tablet (25 mg total) by mouth daily. (Patient not taking: Reported on 03/03/2018)  . nitroGLYCERIN (NITROSTAT) 0.4 MG SL tablet Place 1 tablet (0.4 mg total) under the tongue every 5 (five) minutes as needed.  . [DISCONTINUED] HYDROcodone-acetaminophen (NORCO/VICODIN) 5-325 MG tablet 1-2 tab bid prn (Patient not taking: Reported on 09/08/2017)  . [DISCONTINUED] predniSONE (STERAPRED UNI-PAK 21 TAB) 10 MG (21) TBPK tablet TAKE BY MOUTH AS DIRECTED FOR 6 DAYS  . [DISCONTINUED] valACYclovir (VALTREX) 1000 MG tablet Take 1 tablet (1,000 mg total) by mouth 3 (three) times daily. (Patient not taking: Reported on 09/08/2017)   No facility-administered encounter medications on file as of 03/03/2018.     Surgical History: Past Surgical History:  Procedure Laterality Date  . APPENDECTOMY    . CESAREAN SECTION    . ECTOPIC PREGNANCY SURGERY      Medical History: Past Medical History:  Diagnosis Date  . Cancer ([place])   . COPD (chronic obstructive pulmonary disease) ([place])   . Diverticula of colon   . GERD (gastroesophageal reflux disease)   . Hyperlipidemia   . Renal insufficiency    2009 or 2010    Family History: Family History  Problem Relation Age of Onset  . Breast cancer Paternal Aunt   . Heart attack Mother     Social History: Social History   Socioeconomic History  . Marital status: Divorced    Spouse name: Not on file  . Number of children: Not on file  . Years of education: Not on file  .  Highest education level: Not on file  Occupational History  . Not on file  Social Needs  . Financial resource strain: Not on file  . Food insecurity:    Worry: Not on file    Inability: Not on file  . Transportation needs:    Medical: Not on file    Non-medical: Not on file  Tobacco Use  . Smoking status: Current Every Day Smoker    Packs/day: 0.50    Years: 50.00    Pack years: 25.00    Types: Cigarettes  . Smokeless  tobacco: Never Used  Substance and Sexual Activity  . Alcohol use: Yes  . Drug use: No  . Sexual activity: Never  Lifestyle  . Physical activity:    Days per week: Not on file    Minutes per session: Not on file  . Stress: Not on file  Relationships  . Social connections:    Talks on phone: Not on file    Gets together: Not on file    Attends religious service: Not on file    Active member of club or organization: Not on file    Attends meetings of clubs or organizations: Not on file    Relationship status: Not on file  . Intimate partner violence:    Fear of current or ex partner: Not on file    Emotionally abused: Not on file    Physically abused: Not on file    Forced sexual activity: Not on file  Other Topics Concern  . Not on file  Social History Narrative  . Not on file    Vital Signs: Blood pressure 128/72, pulse 97, resp. rate 16, height 5\' 2"  (1.575 m), weight 159 lb 3.2 oz (72.2 kg), SpO2 94 %.  Examination: General Appearance: The patient is well-developed, well-nourished, and in no distress. Skin: Gross inspection of skin unremarkable. Head: normocephalic, no gross deformities. Eyes: no gross deformities noted. ENT: ears appear grossly normal no exudates. Neck: Supple. No thyromegaly. No LAD. Respiratory: no rhonchi. Cardiovascular: Normal S1 and S2 without murmur or rub. Extremities: No cyanosis. pulses are equal. Neurologic: Alert and oriented. No involuntary movements.  LABS: No results found for this or any previous visit (from the past 2160 hour(s)).  Radiology: Ct Chest W Contrast  Result Date: 06/24/2017 CLINICAL DATA:  Solitary pulmonary nodule. EXAM: CT CHEST WITH CONTRAST TECHNIQUE: Multidetector CT imaging of the chest was performed during intravenous contrast administration. CONTRAST:  75 cc Isovue 300 IV.  No apparent complications. COMPARISON:  Chest CT 05/28/2016 FINDINGS: Cardiovascular: Heart is normal in size. There are coronary artery  calcifications. Calcified and noncalcified atheromatous plaque of the thoracic aorta and branch vessels. No pericardial effusion. Mediastinum/Nodes: No mediastinal or hilar adenopathy. The esophagus is decompressed, small hiatal hernia. Unchanged enhancing 1.8 x 1.5 cm enhancing nodule in the left thyroid lobe allowing for differences in caliper placement. No axillary adenopathy. Lungs/Pleura: Right middle lobe pulmonary nodule has decreased in size currently measuring 2 mm (image 83 series 3), previously 4 mm. Other tiny pulmonary nodules have resolved. No new pulmonary nodule or mass. Mild right greater than left basilar scarring. Mild apical emphysema is unchanged. No pulmonary edema or pleural effusion. No consolidation. Scattered bronchiectasis, most prominent in the lower lobes, unchanged from prior exam. Mucoid impaction is seen on the right. Decreased mucoid impaction on the left from prior. Upper Abdomen: Hepatic steatosis.  No new or acute abnormality. Musculoskeletal: There are no acute or suspicious osseous abnormalities. IMPRESSION: 1. Pulmonary  nodules have decreased in size or resolved consistent with benign etiology. No dedicated further follow-up is needed. 2. Lower lobe bronchiectasis, unchanged from prior exam, persistent but improved mucoid impaction. 3. Aortic atherosclerosis and coronary artery calcifications. 4. Mild emphysema. Aortic Atherosclerosis (ICD10-I70.0) and Emphysema (ICD10-J43.9). Electronically Signed   By: Jeb Levering M.D.   On: 06/24/2017 17:38    No results found.  No results found.    Assessment and Plan: Patient Active Problem List   Diagnosis Date Noted  . Bronchiectasis with acute lower respiratory infection ([place]) 03/03/2018  . Nicotine abuse 03/03/2018  . Mild intermittent asthma without complication 68/01/2121  . Pulmonary nodule 03/03/2018  . Gastroesophageal reflux disease without esophagitis 03/03/2018  . Hyperlipidemia 05/15/2017  . Vitamin D  deficiency 05/15/2017    1. Asthma probable acute flare she will continue with her current medications for asthma continue with supportive care reviewed her pulmonary functions they were within normal limits. 2. Bronchiectasis  Probable with acute exacerbation Z-Pak was given this usually takes care bit for her 3. Smoker unfortunately she continues to smoke will continue to monitor closely 4. GERD patient is stable at this time no active reflux 5. Pulmonary nodule the last CT scan was stable and Radiology recommended no further follow-up  General Counseling: I have discussed the findings of the evaluation and examination with Liora.  I have also discussed any further diagnostic evaluation thatmay be needed or ordered today. Jensen verbalizes understanding of the findings of todays visit. We also reviewed her medications today and discussed drug interactions and side effects including but not limited excessive drowsiness and altered mental states. We also discussed that there is always a risk not just to her but also people around her. she has been encouraged to call the office with any questions or concerns that should arise related to todays visit.    Time spent:  15 min  I have personally obtained a history, examined the patient, evaluated laboratory and imaging results, formulated the assessment and plan and placed orders.    Allyne Gee, MD [facility] Pulmonary and Critical Care Sleep medicine

## 2018-03-09 ENCOUNTER — Other Ambulatory Visit: Payer: Self-pay | Admitting: *Deleted

## 2018-03-09 ENCOUNTER — Telehealth: Payer: Self-pay | Admitting: Obstetrics and Gynecology

## 2018-03-09 MED ORDER — CLORAZEPATE DIPOTASSIUM 7.5 MG PO TABS: 7.5000 mg | ORAL_TABLET | Freq: Every evening | ORAL | 3 refills | Status: DC | PRN

## 2018-03-09 NOTE — Telephone Encounter (Signed)
Printed rx and put on [facility] desk

## 2018-03-09 NOTE — Telephone Encounter (Signed)
Patient called stating she needs a refill on clorazepate. Thanks

## 2018-03-13 ENCOUNTER — Other Ambulatory Visit: Payer: Self-pay | Admitting: *Deleted

## 2018-03-13 ENCOUNTER — Encounter: Payer: Self-pay | Admitting: Obstetrics and Gynecology

## 2018-03-13 MED ORDER — CLORAZEPATE DIPOTASSIUM 7.5 MG PO TABS: 7.5000 mg | ORAL_TABLET | Freq: Every evening | ORAL | 3 refills | Status: DC | PRN

## 2018-03-19 ENCOUNTER — Other Ambulatory Visit: Payer: Self-pay | Admitting: Nurse Practitioner

## 2018-03-19 DIAGNOSIS — J069 Acute upper respiratory infection, unspecified: Secondary | ICD-10-CM

## 2018-03-19 MED ORDER — LEVOFLOXACIN 500 MG PO TABS: 500.0000 mg | ORAL_TABLET | Freq: Every day | ORAL | 0 refills | Status: DC

## 2018-03-19 NOTE — Progress Notes (Signed)
Patient c/o URI, asking for abx be sent to her pharmacy. Sent in levofloxacin 500mg  daily for 10 days.

## 2018-04-01 DIAGNOSIS — M25519 Pain in unspecified shoulder: Secondary | ICD-10-CM | POA: Insufficient documentation

## 2018-05-08 ENCOUNTER — Encounter: Payer: 59 | Admitting: Obstetrics and Gynecology

## 2018-05-25 ENCOUNTER — Other Ambulatory Visit: Payer: Self-pay | Admitting: Nurse Practitioner

## 2018-05-25 ENCOUNTER — Other Ambulatory Visit: Payer: Self-pay | Admitting: Obstetrics and Gynecology

## 2018-05-25 DIAGNOSIS — Z1231 Encounter for screening mammogram for malignant neoplasm of breast: Secondary | ICD-10-CM

## 2018-06-16 ENCOUNTER — Other Ambulatory Visit: Payer: Self-pay | Admitting: Internal Medicine

## 2018-06-16 ENCOUNTER — Ambulatory Visit (INDEPENDENT_AMBULATORY_CARE_PROVIDER_SITE_OTHER): Payer: 59 | Admitting: Adult Health

## 2018-06-16 ENCOUNTER — Encounter: Payer: Self-pay | Admitting: Adult Health

## 2018-06-16 VITALS — BP 110/76 | HR 108 | Temp 97.7°F | Resp 16 | Ht 62.0 in | Wt 163.8 lb

## 2018-06-16 DIAGNOSIS — Z720 Tobacco use: Secondary | ICD-10-CM

## 2018-06-16 DIAGNOSIS — J0100 Acute maxillary sinusitis, unspecified: Secondary | ICD-10-CM | POA: Diagnosis not present

## 2018-06-16 MED ORDER — DOXYCYCLINE HYCLATE 100 MG PO TABS: 100.0000 mg | ORAL_TABLET | Freq: Two times a day (BID) | ORAL | 0 refills | Status: DC

## 2018-06-16 MED ORDER — PNEUMOCOCCAL 13-VAL CONJ VACC IM SUSP: 0.5000 mL | INTRAMUSCULAR | 0 refills | Status: AC

## 2018-06-16 NOTE — Progress Notes (Signed)
[facility] [place], [place] [address]  Internal MEDICINE  Office Visit Note  Patient Name: Stephanie Hess  [id number]  [id number]  Date of Service: 06/16/2018  Chief Complaint  Patient presents with  . Sinusitis  . Ear Pain     HPI Pt is here for a sick visit.  She reports headache, tooth pain, sinus pain and ear pain x 4 days.  She reports motrin makes the pain go away for six hours but its getting worse over the last couple days.  She denies fever, chills or neck pain currently.     Current Medication:  Outpatient Encounter Medications as of 06/16/2018  Medication Sig  . aspirin EC 81 MG tablet Take 1 tablet (81 mg total) by mouth daily.  . budesonide-formoterol (SYMBICORT) 160-4.5 MCG/ACT inhaler Inhale 2 puffs into the lungs 2 (two) times daily.  . clorazepate (TRANXENE) 7.5 MG tablet Take 1 tablet (7.5 mg total) by mouth at bedtime as needed for anxiety.  Marland Kitchen ibuprofen (ADVIL,MOTRIN) 800 MG tablet Take 1 tablet (800 mg total) by mouth every 8 (eight) hours as needed.  Marland Kitchen omeprazole (PRILOSEC) 20 MG capsule Take 20 mg by mouth daily.  . Vitamin D, Ergocalciferol, (DRISDOL) 50000 units CAPS capsule Take 1 capsule (50,000 Units total) by mouth 2 (two) times a week.  Marland Kitchen azithromycin (ZITHROMAX) 250 MG tablet As directed (Patient not taking: Reported on 06/16/2018)  . levofloxacin (LEVAQUIN) 500 MG tablet Take 1 tablet (500 mg total) by mouth daily. (Patient not taking: Reported on 06/16/2018)  . metoprolol succinate (TOPROL XL) 25 MG 24 hr tablet Take 1 tablet (25 mg total) by mouth daily. (Patient not taking: Reported on 03/03/2018)  . nitroGLYCERIN (NITROSTAT) 0.4 MG SL tablet Place 1 tablet (0.4 mg total) under the tongue every 5 (five) minutes as needed. (Patient not taking: Reported on 06/16/2018)   No facility-administered encounter medications on file as of 06/16/2018.       Medical History: Past Medical History:  Diagnosis Date  . Cancer ([place])   .  COPD (chronic obstructive pulmonary disease) ([place])   . Diverticula of colon   . GERD (gastroesophageal reflux disease)   . Hyperlipidemia   . Renal insufficiency    2009 or 2010     Vital Signs: BP 110/76   Pulse (!) 108   Temp 97.7 F (36.5 C)   Resp 16   Ht 5\' 2"  (1.575 m)   Wt 163 lb 12.8 oz (74.3 kg)   SpO2 93%   BMI 29.96 kg/m    Review of Systems  Constitutional: Negative for chills, fatigue and unexpected weight change.  HENT: Negative for congestion, rhinorrhea, sneezing and sore throat.   Eyes: Negative for photophobia, pain and redness.  Respiratory: Negative for cough, chest tightness and shortness of breath.   Cardiovascular: Negative for chest pain and palpitations.  Gastrointestinal: Negative for abdominal pain, constipation, diarrhea, nausea and vomiting.  Endocrine: Negative.   Genitourinary: Negative for dysuria and frequency.  Musculoskeletal: Negative for arthralgias, back pain, joint swelling and neck pain.  Skin: Negative for rash.  Allergic/Immunologic: Negative.   Neurological: Negative for tremors and numbness.  Hematological: Negative for adenopathy. Does not bruise/bleed easily.  Psychiatric/Behavioral: Negative for behavioral problems and sleep disturbance. The patient is not nervous/anxious.     Physical Exam  Constitutional: She is oriented to person, place, and time. She appears well-developed and well-nourished. No distress.  HENT:  Head: Normocephalic and atraumatic.  Mouth/Throat: Oropharynx is clear  and moist. No oropharyngeal exudate.  Eyes: Pupils are equal, round, and reactive to light. EOM are normal.  Neck: Normal range of motion. Neck supple. No JVD present. No tracheal deviation present. No thyromegaly present.  Cardiovascular: Normal rate, regular rhythm and normal heart sounds. Exam reveals no gallop and no friction rub.  No murmur heard. Pulmonary/Chest: Effort normal and breath sounds normal. No respiratory distress. She has  no wheezes. She has no rales. She exhibits no tenderness.  Abdominal: Soft. There is no tenderness. There is no guarding.  Musculoskeletal: Normal range of motion.  Lymphadenopathy:    She has no cervical adenopathy.  Neurological: She is alert and oriented to person, place, and time. No cranial nerve deficit.  Skin: Skin is warm and dry. She is not diaphoretic.  Psychiatric: She has a normal mood and affect. Her behavior is normal. Judgment and thought content normal.  Nursing note and vitals reviewed.   Assessment/Plan: 1. Acute non-recurrent maxillary sinusitis Take doxy as indicated.  Avoid prolonged sun exposure. Encouraged patient to use OTC decongestant, or use dayquil for day time symptoms.   - doxycycline (VIBRA-TABS) 100 MG tablet; Take 1 tablet (100 mg total) by mouth 2 (two) times daily.  Dispense: 20 tablet; Refill: 0  2. Nicotine abuse Smoking cessation counseling: 1. Pt acknowledges the risks of long term smoking, she will try to quite smoking. 2. Options for different medications including nicotine products, chewing gum, patch etc, Wellbutrin and Chantix is discussed 3. Goal and date of compete cessation is discussed 4. Total time spent in smoking cessation is 10 min.   General Counseling: Otila verbalizes understanding of the findings of todays visit and agrees with plan of treatment. I have discussed any further diagnostic evaluation that may be needed or ordered today. We also reviewed her medications today. she has been encouraged to call the office with any questions or concerns that should arise related to todays visit.   No orders of the defined types were placed in this encounter.   No orders of the defined types were placed in this encounter.   Time spent: 25 Minutes  This patient was seen by Orson Gear AGNP-C in Collaboration with Dr Lavera Guise as a part of collaborative care agreement

## 2018-06-16 NOTE — Patient Instructions (Signed)

## 2018-06-18 ENCOUNTER — Encounter: Payer: Self-pay | Admitting: Adult Health

## 2018-06-19 ENCOUNTER — Ambulatory Visit: Payer: Self-pay | Admitting: Podiatry

## 2018-07-11 ENCOUNTER — Other Ambulatory Visit: Payer: Self-pay

## 2018-07-11 ENCOUNTER — Ambulatory Visit
Admission: EM | Admit: 2018-07-11 | Discharge: 2018-07-11 | Disposition: A | Discharge status: Home / Self Care | Payer: 59 | Attending: Family Medicine | Admitting: Family Medicine

## 2018-07-11 DIAGNOSIS — H6503 Acute serous otitis media, bilateral: Secondary | ICD-10-CM | POA: Diagnosis not present

## 2018-07-11 DIAGNOSIS — J0100 Acute maxillary sinusitis, unspecified: Secondary | ICD-10-CM

## 2018-07-11 MED ORDER — AZITHROMYCIN 250 MG PO TABS: ORAL_TABLET | ORAL | 0 refills | Status: DC

## 2018-07-11 NOTE — ED Triage Notes (Signed)
Pt with sinus pain and pressure, otalgia, teeth pain and mild sore throat

## 2018-07-11 NOTE — ED Provider Notes (Signed)
[facility]    CSN: [id number] Arrival date & time: 07/11/18  1548     History   Chief Complaint Chief Complaint  Patient presents with  . Otalgia  . Facial Pain    HPI Stephanie Hess is a 67 y.o. female.   The history is provided by the patient.  Otalgia  Associated symptoms: congestion   URI  Presenting symptoms: congestion, ear pain, facial pain and fatigue   Severity:  Moderate Onset quality:  Sudden Duration:  10 days Timing:  Constant Progression:  Worsening Chronicity:  New Relieved by:  Nothing Ineffective treatments:  OTC medications Associated symptoms: sinus pain   Risk factors: chronic respiratory disease   Risk factors: not elderly, no chronic cardiac disease, no chronic kidney disease, no diabetes mellitus, no immunosuppression, no recent illness, no recent travel and no sick contacts     Past Medical History:  Diagnosis Date  . Cancer ([place])   . COPD (chronic obstructive pulmonary disease) ([place])   . Diverticula of colon   . GERD (gastroesophageal reflux disease)   . Hyperlipidemia   . Renal insufficiency    2009 or 2010    Patient Active Problem List   Diagnosis Date Noted  . Bronchiectasis with acute lower respiratory infection ([place]) 03/03/2018  . Nicotine abuse 03/03/2018  . Mild intermittent asthma without complication 40/98/1191  . Pulmonary nodule 03/03/2018  . Gastroesophageal reflux disease without esophagitis 03/03/2018  . Hyperlipidemia 05/15/2017  . Vitamin D deficiency 05/15/2017    Past Surgical History:  Procedure Laterality Date  . APPENDECTOMY    . CESAREAN SECTION    . ECTOPIC PREGNANCY SURGERY      OB History    Gravida  2   Para  2   Term      Preterm      AB      Living        SAB      TAB      Ectopic      Multiple      Live Births               Home Medications    Prior to Admission medications   Medication Sig Start Date End Date Taking? Authorizing Provider  aspirin  EC 81 MG tablet Take 1 tablet (81 mg total) by mouth daily. 06/04/16   Wende Bushy, MD  azithromycin (ZITHROMAX Z-PAK) 250 MG tablet 2 tabs po once day 1, then 1 tab po qd for next 4 days 07/11/18   Norval Gable, MD  budesonide-formoterol [facility]) 160-4.5 MCG/ACT inhaler Inhale 2 puffs into the lungs 2 (two) times daily.    [provider]  clorazepate (TRANXENE) 7.5 MG tablet Take 1 tablet (7.5 mg total) by mouth at bedtime as needed for anxiety. 03/13/18   Shambley, Melody N, CNM  doxycycline (VIBRA-TABS) 100 MG tablet Take 1 tablet (100 mg total) by mouth 2 (two) times daily. 06/16/18   Kendell Bane, NP  ibuprofen (ADVIL,MOTRIN) 800 MG tablet Take 1 tablet (800 mg total) by mouth every 8 (eight) hours as needed. 10/16/16   Melynda Ripple, MD  levofloxacin (LEVAQUIN) 500 MG tablet Take 1 tablet (500 mg total) by mouth daily. Patient not taking: Reported on 06/16/2018 03/19/18   Ronnell Freshwater, NP  metoprolol succinate (TOPROL XL) 25 MG 24 hr tablet Take 1 tablet (25 mg total) by mouth daily. Patient not taking: Reported on 03/03/2018 06/04/16   Wende Bushy, MD  nitroGLYCERIN (  NITROSTAT) 0.4 MG SL tablet Place 1 tablet (0.4 mg total) under the tongue every 5 (five) minutes as needed. Patient not taking: Reported on 06/16/2018 06/04/16   Wende Bushy, MD  omeprazole (PRILOSEC) 20 MG capsule Take 20 mg by mouth daily.    [provider]  Vitamin D, Ergocalciferol, (DRISDOL) 50000 units CAPS capsule Take 1 capsule (50,000 Units total) by mouth 2 (two) times a week. 05/15/17   Joylene Igo, CNM    Family History Family History  Problem Relation Age of Onset  . Breast cancer Paternal Aunt   . Heart attack Mother     Social History Social History   Tobacco Use  . Smoking status: Current Every Day Smoker    Packs/day: 0.50    Years: 50.00    Pack years: 25.00    Types: Cigarettes  . Smokeless tobacco: Never Used  Substance Use Topics  . Alcohol use: Yes     Comment: social  . Drug use: No     Allergies   Penicillins; Shellfish allergy; Sulfa antibiotics; Biaxin [clarithromycin]; Amoxicillin-pot clavulanate; Atorvastatin; and Macrobid [nitrofurantoin]   Review of Systems Review of Systems  Constitutional: Positive for fatigue.  HENT: Positive for congestion, ear pain and sinus pain.      Physical Exam Triage Vital Signs ED Triage Vitals  Enc Vitals Group     BP 07/11/18 1555 (!) 142/85     Pulse Rate 07/11/18 1555 100     Resp 07/11/18 1555 18     Temp 07/11/18 1555 98 F (36.7 C)     Temp Source 07/11/18 1555 Oral     SpO2 07/11/18 1555 97 %     Weight 07/11/18 1556 160 lb (72.6 kg)     Height 07/11/18 1556 5\' 2"  (1.575 m)     Head Circumference --      Peak Flow --      Pain Score 07/11/18 1556 8     Pain Loc --      Pain Edu? --      Excl. in [place]? --    No data found.  Updated Vital Signs BP (!) 142/85 (BP Location: Left Arm)   Pulse 100   Temp 98 F (36.7 C) (Oral)   Resp 18   Ht 5\' 2"  (1.575 m)   Wt 72.6 kg   SpO2 97%   BMI 29.26 kg/m   Visual Acuity Right Eye Distance:   Left Eye Distance:   Bilateral Distance:    Right Eye Near:   Left Eye Near:    Bilateral Near:     Physical Exam  Constitutional: She appears well-developed and well-nourished. No distress.  HENT:  Head: Normocephalic and atraumatic.  Right Ear: Tympanic membrane, external ear and ear canal normal.  Left Ear: Tympanic membrane, external ear and ear canal normal.  Nose: Mucosal edema and rhinorrhea present. No nose lacerations, sinus tenderness, nasal deformity, septal deviation or nasal septal hematoma. No epistaxis.  No foreign bodies. Right sinus exhibits maxillary sinus tenderness and frontal sinus tenderness. Left sinus exhibits maxillary sinus tenderness and frontal sinus tenderness.  Mouth/Throat: Uvula is midline, oropharynx is clear and moist and mucous membranes are normal. No oropharyngeal exudate.  Eyes: Pupils are  equal, round, and reactive to light. Conjunctivae and EOM are normal. Right eye exhibits no discharge. Left eye exhibits no discharge. No scleral icterus.  Neck: Normal range of motion. Neck supple. No thyromegaly present.  Cardiovascular: Normal rate, regular rhythm and normal heart  sounds.  Pulmonary/Chest: Effort normal and breath sounds normal. No stridor. No respiratory distress. She has no wheezes. She has no rales.  Lymphadenopathy:    She has no cervical adenopathy.  Skin: She is not diaphoretic.  Nursing note and vitals reviewed.    UC Treatments / Results  Labs (all labs ordered are listed, but only abnormal results are displayed) Labs Reviewed - No data to display  EKG None  Radiology No results found.  Procedures Procedures (including critical care time)  Medications Ordered in UC Medications - No data to display  Initial Impression / Assessment and Plan / UC Course  I have reviewed the triage vital signs and the nursing notes.  Pertinent labs & imaging results that were available during my care of the patient were reviewed by me and considered in my medical decision making (see chart for details).      Final Clinical Impressions(s) / UC Diagnoses   Final diagnoses:  Acute maxillary sinusitis, recurrence not specified  Bilateral acute serous otitis media, recurrence not specified    ED Prescriptions    Medication Sig Dispense Auth. Provider   azithromycin (ZITHROMAX Z-PAK) 250 MG tablet 2 tabs po once day 1, then 1 tab po qd for next 4 days 6 each Norval Gable, MD      1. diagnosis reviewed with patient 2. rx as per orders above; reviewed possible side effects, interactions, risks and benefits  3. Recommend supportive treatment with otc flonase 4. Follow-up prn if symptoms worsen or don't improve   Controlled Substance Prescriptions  Controlled Substance Registry consulted? Not Applicable   Norval Gable, MD 07/11/18 [phone]

## 2018-07-16 ENCOUNTER — Encounter: Payer: Self-pay | Admitting: Emergency Medicine

## 2018-07-16 ENCOUNTER — Other Ambulatory Visit: Payer: Self-pay

## 2018-07-16 ENCOUNTER — Ambulatory Visit (INDEPENDENT_AMBULATORY_CARE_PROVIDER_SITE_OTHER)
Admission: EM | Admit: 2018-07-16 | Discharge: 2018-07-16 | Disposition: A | Discharge status: Other Acute Inpatient Hospital | Payer: 59 | Source: Home / Self Care | Attending: Family Medicine | Admitting: Family Medicine

## 2018-07-16 ENCOUNTER — Emergency Department: Payer: 59

## 2018-07-16 ENCOUNTER — Ambulatory Visit (INDEPENDENT_AMBULATORY_CARE_PROVIDER_SITE_OTHER): Payer: 59

## 2018-07-16 ENCOUNTER — Observation Stay
Admission: EM | Admit: 2018-07-16 | Discharge: 2018-07-17 | Disposition: A | Discharge status: Home / Self Care | Payer: 59 | Attending: Internal Medicine | Admitting: Internal Medicine

## 2018-07-16 ENCOUNTER — Observation Stay: Payer: 59

## 2018-07-16 DIAGNOSIS — J209 Acute bronchitis, unspecified: Secondary | ICD-10-CM | POA: Insufficient documentation

## 2018-07-16 DIAGNOSIS — J4520 Mild intermittent asthma, uncomplicated: Secondary | ICD-10-CM | POA: Insufficient documentation

## 2018-07-16 DIAGNOSIS — R0600 Dyspnea, unspecified: Secondary | ICD-10-CM

## 2018-07-16 DIAGNOSIS — E785 Hyperlipidemia, unspecified: Secondary | ICD-10-CM | POA: Insufficient documentation

## 2018-07-16 DIAGNOSIS — F17210 Nicotine dependence, cigarettes, uncomplicated: Secondary | ICD-10-CM | POA: Insufficient documentation

## 2018-07-16 DIAGNOSIS — J9811 Atelectasis: Secondary | ICD-10-CM | POA: Diagnosis not present

## 2018-07-16 DIAGNOSIS — Z79899 Other long term (current) drug therapy: Secondary | ICD-10-CM | POA: Diagnosis not present

## 2018-07-16 DIAGNOSIS — E559 Vitamin D deficiency, unspecified: Secondary | ICD-10-CM | POA: Insufficient documentation

## 2018-07-16 DIAGNOSIS — Z7982 Long term (current) use of aspirin: Secondary | ICD-10-CM | POA: Insufficient documentation

## 2018-07-16 DIAGNOSIS — R0602 Shortness of breath: Secondary | ICD-10-CM | POA: Diagnosis not present

## 2018-07-16 DIAGNOSIS — J440 Chronic obstructive pulmonary disease with acute lower respiratory infection: Principal | ICD-10-CM | POA: Insufficient documentation

## 2018-07-16 DIAGNOSIS — K219 Gastro-esophageal reflux disease without esophagitis: Secondary | ICD-10-CM | POA: Diagnosis not present

## 2018-07-16 DIAGNOSIS — R0603 Acute respiratory distress: Secondary | ICD-10-CM | POA: Diagnosis present

## 2018-07-16 DIAGNOSIS — R05 Cough: Secondary | ICD-10-CM | POA: Diagnosis not present

## 2018-07-16 DIAGNOSIS — J441 Chronic obstructive pulmonary disease with (acute) exacerbation: Secondary | ICD-10-CM

## 2018-07-16 DIAGNOSIS — Z7951 Long term (current) use of inhaled steroids: Secondary | ICD-10-CM | POA: Diagnosis not present

## 2018-07-16 LAB — CBC
HEMATOCRIT: 39.4 % (ref 35.0–47.0)
HEMOGLOBIN: 13.7 g/dL (ref 12.0–16.0)
MCH: 31.7 pg (ref 26.0–34.0)
MCHC: 34.7 g/dL (ref 32.0–36.0)
MCV: 91.4 fL (ref 80.0–100.0)
Platelets: 277 10*3/uL (ref 150–440)
RBC: 4.31 MIL/uL (ref 3.80–5.20)
RDW: 13.8 % (ref 11.5–14.5)
WBC: 14.7 10*3/uL — ABNORMAL HIGH (ref 3.6–11.0)

## 2018-07-16 LAB — BASIC METABOLIC PANEL
ANION GAP: 6 (ref 5–15)
BUN: 11 mg/dL (ref 8–23)
CHLORIDE: 105 mmol/L (ref 98–111)
CO2: 26 mmol/L (ref 22–32)
Calcium: 9.7 mg/dL (ref 8.9–10.3)
Creatinine, Ser: 0.79 mg/dL (ref 0.44–1.00)
GFR calc Af Amer: >60 mL/min (ref >60)
GFR calc non Af Amer: >60 mL/min (ref >60)
GLUCOSE: 134 mg/dL — AB (ref 70–99)
POTASSIUM: 4.2 mmol/L (ref 3.5–5.1)
Sodium: 137 mmol/L (ref 135–145)

## 2018-07-16 LAB — TROPONIN I: Troponin I: <0.03 ng/mL (ref <0.03)

## 2018-07-16 MED ORDER — ONDANSETRON HCL 4 MG/2ML IJ SOLN: 4.0000 mg | Freq: Four times a day (QID) | INTRAMUSCULAR | Status: DC | PRN

## 2018-07-16 MED ORDER — ACETAMINOPHEN 325 MG PO TABS
650.0000 mg | ORAL_TABLET | Freq: Four times a day (QID) | ORAL | Status: DC | PRN
  Administered 2018-07-17: 650 mg via ORAL
  Filled 2018-07-16: qty 2

## 2018-07-16 MED ORDER — PANTOPRAZOLE SODIUM 40 MG PO TBEC
40.0000 mg | DELAYED_RELEASE_TABLET | Freq: Every day | ORAL | Status: DC
  Administered 2018-07-17: 40 mg via ORAL
  Filled 2018-07-16: qty 1

## 2018-07-16 MED ORDER — DOCUSATE SODIUM 100 MG PO CAPS
100.0000 mg | ORAL_CAPSULE | Freq: Two times a day (BID) | ORAL | Status: DC
  Filled 2018-07-16: qty 1

## 2018-07-16 MED ORDER — ONDANSETRON HCL 4 MG PO TABS: 4.0000 mg | ORAL_TABLET | Freq: Four times a day (QID) | ORAL | Status: DC | PRN

## 2018-07-16 MED ORDER — IPRATROPIUM-ALBUTEROL 0.5-2.5 (3) MG/3ML IN SOLN
6.0000 mL | Freq: Once | RESPIRATORY_TRACT | Status: AC
  Administered 2018-07-16: 6 mL via RESPIRATORY_TRACT
  Filled 2018-07-16: qty 6

## 2018-07-16 MED ORDER — SODIUM CHLORIDE 0.9 % IV SOLN
500.0000 mg | Freq: Once | INTRAVENOUS | Status: AC
  Administered 2018-07-16: 500 mg via INTRAVENOUS
  Filled 2018-07-16: qty 500

## 2018-07-16 MED ORDER — SODIUM CHLORIDE 0.9 % IV SOLN
1.0000 g | Freq: Once | INTRAVENOUS | Status: AC
  Administered 2018-07-16: 1 g via INTRAVENOUS
  Filled 2018-07-16: qty 10

## 2018-07-16 MED ORDER — VITAMIN D (ERGOCALCIFEROL) 1.25 MG (50000 UNIT) PO CAPS: 50000.0000 [IU] | ORAL_CAPSULE | ORAL | Status: DC

## 2018-07-16 MED ORDER — SODIUM CHLORIDE 0.9 % IV BOLUS
1000.0000 mL | Freq: Once | INTRAVENOUS | Status: AC
  Administered 2018-07-16: 1000 mL via INTRAVENOUS

## 2018-07-16 MED ORDER — METHYLPREDNISOLONE SODIUM SUCC 125 MG IJ SOLR
125.0000 mg | Freq: Once | INTRAMUSCULAR | Status: AC
  Administered 2018-07-16: 125 mg via INTRAMUSCULAR

## 2018-07-16 MED ORDER — NITROGLYCERIN 0.4 MG SL SUBL: 0.4000 mg | SUBLINGUAL_TABLET | SUBLINGUAL | Status: DC | PRN

## 2018-07-16 MED ORDER — CLORAZEPATE DIPOTASSIUM 7.5 MG PO TABS
7.5000 mg | ORAL_TABLET | Freq: Every evening | ORAL | Status: DC | PRN
  Administered 2018-07-17: 7.5 mg via ORAL
  Filled 2018-07-16: qty 1

## 2018-07-16 MED ORDER — IPRATROPIUM-ALBUTEROL 0.5-2.5 (3) MG/3ML IN SOLN
3.0000 mL | Freq: Once | RESPIRATORY_TRACT | Status: AC
  Administered 2018-07-16: 3 mL via RESPIRATORY_TRACT

## 2018-07-16 MED ORDER — IOHEXOL 350 MG/ML SOLN
75.0000 mL | Freq: Once | INTRAVENOUS | Status: AC | PRN
  Administered 2018-07-16: 75 mL via INTRAVENOUS

## 2018-07-16 MED ORDER — FLUTICASONE FUROATE-VILANTEROL 200-25 MCG/INH IN AEPB
1.0000 | INHALATION_SPRAY | Freq: Every day | RESPIRATORY_TRACT | Status: DC
  Administered 2018-07-17: 1 via RESPIRATORY_TRACT
  Filled 2018-07-16: qty 28

## 2018-07-16 MED ORDER — ACETAMINOPHEN 650 MG RE SUPP: 650.0000 mg | Freq: Four times a day (QID) | RECTAL | Status: DC | PRN

## 2018-07-16 NOTE — ED Triage Notes (Signed)
Patient c/o cough and congestion off and on for a month.  Patient reports SOB.

## 2018-07-16 NOTE — ED Provider Notes (Signed)
[facility]    CSN: [id number] Arrival date & time: 07/16/18  1659     History   Chief Complaint Chief Complaint  Patient presents with  . Cough  . Shortness of Breath   HPI  67 year old female with a past medical history of COPD/emphysema, asthma, bronchiectasis, tobacco abuse presents with shortness of breath.  Patient has been seen twice by provider this past month.  Has been diagnosed with sinusitis.  Has been on 2 courses of antibiotic's.  Just finished azithromycin yesterday.  She states that today while at work, she began to have shortness of breath.  She states that she does not feel well.  She reports productive cough.  No documented fever.  She does report that she feels cold.  Patient states that she was feeling well yesterday.  Her daughter states she has not been feeling well for the past month.  Patient is visibly dyspneic and does not appear well.  No known exacerbating factors.  No other associated symptoms.   PMH, Surgical Hx, Family Hx, Social History reviewed and updated as below.  Past Medical History:  Diagnosis Date  . Cancer ([place])   . COPD (chronic obstructive pulmonary disease) ([place])   . Diverticula of colon   . GERD (gastroesophageal reflux disease)   . Hyperlipidemia   . Renal insufficiency    2009 or 2010   Patient Active Problem List   Diagnosis Date Noted  . Bronchiectasis with acute lower respiratory infection ([place]) 03/03/2018  . Nicotine abuse 03/03/2018  . Mild intermittent asthma without complication 15/40/0867  . Pulmonary nodule 03/03/2018  . Gastroesophageal reflux disease without esophagitis 03/03/2018  . Hyperlipidemia 05/15/2017  . Vitamin D deficiency 05/15/2017   Past Surgical History:  Procedure Laterality Date  . APPENDECTOMY    . CESAREAN SECTION    . ECTOPIC PREGNANCY SURGERY     OB History    Gravida  2   Para  2   Term      Preterm      AB      Living        SAB      TAB      Ectopic      Multiple      Live Births               Home Medications    Prior to Admission medications   Medication Sig Start Date End Date Taking? Authorizing Provider  aspirin EC 81 MG tablet Take 1 tablet (81 mg total) by mouth daily. 06/04/16  Yes Wende Bushy, MD  budesonide-formoterol (SYMBICORT) 160-4.5 MCG/ACT inhaler Inhale 2 puffs into the lungs 2 (two) times daily.   Yes [provider]  clorazepate (TRANXENE) 7.5 MG tablet Take 1 tablet (7.5 mg total) by mouth at bedtime as needed for anxiety. 03/13/18  Yes Shambley, Melody N, CNM  omeprazole (PRILOSEC) 20 MG capsule Take 20 mg by mouth daily.   Yes [provider]  Vitamin D, Ergocalciferol, (DRISDOL) 50000 units CAPS capsule Take 1 capsule (50,000 Units total) by mouth 2 (two) times a week. 05/15/17  Yes Shambley, Melody N, CNM  ibuprofen (ADVIL,MOTRIN) 800 MG tablet Take 1 tablet (800 mg total) by mouth every 8 (eight) hours as needed. 10/16/16   Melynda Ripple, MD  nitroGLYCERIN (NITROSTAT) 0.4 MG SL tablet Place 1 tablet (0.4 mg total) under the tongue every 5 (five) minutes as needed. Patient not taking: Reported on 06/16/2018 06/04/16   Wende Bushy, MD  Family History Family History  Problem Relation Age of Onset  . Breast cancer Paternal Aunt   . Heart attack Mother     Social History Social History   Tobacco Use  . Smoking status: Current Every Day Smoker    Packs/day: 0.50    Years: 50.00    Pack years: 25.00    Types: Cigarettes  . Smokeless tobacco: Never Used  Substance Use Topics  . Alcohol use: Yes    Comment: social  . Drug use: No     Allergies   Penicillins; Shellfish allergy; Sulfa antibiotics; Biaxin [clarithromycin]; Amoxicillin-pot clavulanate; Atorvastatin; and Macrobid [nitrofurantoin]   Review of Systems Review of Systems  Constitutional: Negative for fever.  Respiratory: Positive for cough and shortness of breath.    Physical Exam Triage Vital Signs ED Triage Vitals   Enc Vitals Group     BP 07/16/18 1708 (!) 165/94     Pulse Rate 07/16/18 1708 (!) 110     Resp 07/16/18 1708 18     Temp 07/16/18 1708 97.6 F (36.4 C)     Temp Source 07/16/18 1708 Oral     SpO2 07/16/18 1708 96 %     Weight 07/16/18 1705 160 lb (72.6 kg)     Height 07/16/18 1705 5\' 2"  (1.575 m)     Head Circumference --      Peak Flow --      Pain Score 07/16/18 1705 0     Pain Loc --      Pain Edu? --      Excl. in [place]? --    Updated Vital Signs BP (!) 165/94 (BP Location: Left Arm)   Pulse (!) 110   Temp 97.6 F (36.4 C) (Oral)   Resp 18   Ht 5\' 2"  (1.575 m)   Wt 72.6 kg   SpO2 97%   BMI 29.26 kg/m   Visual Acuity Right Eye Distance:   Left Eye Distance:   Bilateral Distance:    Right Eye Near:   Left Eye Near:    Bilateral Near:     Physical Exam  Constitutional: She is oriented to person, place, and time. She appears well-developed.  Patient appears to be mildly dyspneic.  Eyes: Conjunctivae are normal. Right eye exhibits no discharge. Left eye exhibits no discharge.  Cardiovascular: Regular rhythm.  Tachycardic.  Pulmonary/Chest:  Diffuse wheezing and coarse breath sounds. Patient visibly dyspneic.  Neurological: She is alert and oriented to person, place, and time.  Psychiatric: She has a normal mood and affect. Her behavior is normal.  Nursing note and vitals reviewed.  UC Treatments / Results  Labs (all labs ordered are listed, but only abnormal results are displayed) Labs Reviewed - No data to display  EKG None  Radiology Dg Chest 2 View  Result Date: 07/16/2018 CLINICAL DATA:  Cough and dyspnea EXAM: CHEST - 2 VIEW COMPARISON:  06/24/2017 CT FINDINGS: The heart size and mediastinal contours are within normal limits. Streaky parenchymal opacities in the right middle lobe medial left lower lobe compatible with atelectasis. No confluent pulmonary opacities to suggest pneumonia. No effusion or pneumothorax. The visualized skeletal structures are  unremarkable. IMPRESSION: Right middle and left lower lobe subsegmental atelectasis. No active pulmonary disease. Electronically Signed   By: Ashley Royalty M.D.   On: 07/16/2018 17:43    Procedures Procedures (including critical care time)  Medications Ordered in UC Medications  ipratropium-albuterol (DUONEB) 0.5-2.5 (3) MG/3ML nebulizer solution 3 mL (3 mLs Nebulization Given 07/16/18  1722)  methylPREDNISolone sodium succinate (SOLU-MEDROL) 125 mg/2 mL injection 125 mg (125 mg Intramuscular Given 07/16/18 1731)    Initial Impression / Assessment and Plan / UC Course  I have reviewed the triage vital signs and the nursing notes.  Pertinent labs & imaging results that were available during my care of the patient were reviewed by me and considered in my medical decision making (see chart for details).    67 year old female presents with what appears to be a COPD exacerbation.  Patient was given DuoNeb with minimal improvement.  Solu-Medrol also given.  Patient remains dyspneic.  Sending via EMS to the hospital. Chest xray with atelectasis.   Final Clinical Impressions(s) / UC Diagnoses   Final diagnoses:  COPD exacerbation [facility])   Discharge Instructions   None    ED Prescriptions    None     Controlled Substance Prescriptions [place] Controlled Substance Registry consulted? Not Applicable   Coral Spikes, DO 07/16/18 1810

## 2018-07-16 NOTE — ED Provider Notes (Signed)
[facility] Emergency Department Provider Note ____________________________________________   First MD Initiated Contact with Patient 07/16/18 2043     (approximate)  I have reviewed the triage vital signs and the nursing notes.   HISTORY  Chief Complaint Shortness of Breath  HPI Stephanie Hess is a 67 y.o. female with a history of COPD who was presented with 1 month of cough and shortness of breath.  She says as of today she has started to cough up green sputum.  She says that she was initially placed on doxycycline and azithromycin.  She was seen in urgent care earlier tonight and given a shot of IM Solu-Medrol.  She was also given a DuoNeb at that time.  She was sent to the emergency department for further evaluation and treatment.  Despite her treatment with steroids being several hours ago she says that she is still feeling short of breath.  Past Medical History:  Diagnosis Date  . Cancer ([place])   . COPD (chronic obstructive pulmonary disease) ([place])   . Diverticula of colon   . GERD (gastroesophageal reflux disease)   . Hyperlipidemia   . Renal insufficiency    2009 or 2010    Patient Active Problem List   Diagnosis Date Noted  . Bronchiectasis with acute lower respiratory infection ([place]) 03/03/2018  . Nicotine abuse 03/03/2018  . Mild intermittent asthma without complication 57/84/6962  . Pulmonary nodule 03/03/2018  . Gastroesophageal reflux disease without esophagitis 03/03/2018  . Hyperlipidemia 05/15/2017  . Vitamin D deficiency 05/15/2017    Past Surgical History:  Procedure Laterality Date  . APPENDECTOMY    . CESAREAN SECTION    . ECTOPIC PREGNANCY SURGERY      Prior to Admission medications   Medication Sig Start Date End Date Taking? Authorizing Provider  aspirin EC 81 MG tablet Take 1 tablet (81 mg total) by mouth daily. 06/04/16  Yes Wende Bushy, MD  budesonide-formoterol (SYMBICORT) 160-4.5 MCG/ACT inhaler Inhale 2 puffs  into the lungs 2 (two) times daily as needed (shortness of breath).    Yes [provider]  clorazepate (TRANXENE) 7.5 MG tablet Take 1 tablet (7.5 mg total) by mouth at bedtime as needed for anxiety. 03/13/18  Yes Shambley, Melody N, CNM  ibuprofen (ADVIL,MOTRIN) 800 MG tablet Take 1 tablet (800 mg total) by mouth every 8 (eight) hours as needed. Patient taking differently: Take 800 mg by mouth every 8 (eight) hours as needed for mild pain.  10/16/16  Yes Melynda Ripple, MD  omeprazole (PRILOSEC) 20 MG capsule Take 20 mg by mouth daily.   Yes [provider]  Vitamin D, Ergocalciferol, (DRISDOL) 50000 units CAPS capsule Take 1 capsule (50,000 Units total) by mouth 2 (two) times a week. 05/15/17  Yes Shambley, Melody N, CNM  nitroGLYCERIN (NITROSTAT) 0.4 MG SL tablet Place 1 tablet (0.4 mg total) under the tongue every 5 (five) minutes as needed. Patient taking differently: Place 0.4 mg under the tongue every 5 (five) minutes as needed for chest pain.  06/04/16   Wende Bushy, MD    Allergies Penicillins; Shellfish allergy; Sulfa antibiotics; Biaxin [clarithromycin]; Amoxicillin-pot clavulanate; Atorvastatin; and Macrobid [nitrofurantoin]  Family History  Problem Relation Age of Onset  . Breast cancer Paternal Aunt   . Heart attack Mother     Social History Social History   Tobacco Use  . Smoking status: Current Every Day Smoker    Packs/day: 0.50    Years: 50.00    Pack years: 25.00  Types: Cigarettes  . Smokeless tobacco: Never Used  Substance Use Topics  . Alcohol use: Yes    Comment: social  . Drug use: No    Review of Systems  Constitutional: No fever/chills Eyes: No visual changes. ENT: No sore throat. Cardiovascular: Denies chest pain. Respiratory: As above Gastrointestinal: No abdominal pain.  No nausea, no vomiting.  No diarrhea.  No constipation. Genitourinary: Negative for dysuria. Musculoskeletal: Negative for back pain. Skin: Negative for  rash. Neurological: Negative for headaches, focal weakness or numbness.   ____________________________________________   PHYSICAL EXAM:  VITAL SIGNS: ED Triage Vitals [07/16/18 1854]  Enc Vitals Group     BP 134/88     Pulse Rate (!) 115     Resp 18     Temp 98.1 F (36.7 C)     Temp Source Oral     SpO2 96 %     Weight 160 lb (72.6 kg)     Height 5\' 2"  (1.575 m)     Head Circumference      Peak Flow      Pain Score 0     Pain Loc      Pain Edu?      Excl. in [place]?     Constitutional: Alert and oriented.  No acute distress.  Speaks in full sentences. Eyes: Conjunctivae are normal.  Head: Atraumatic. Nose: No congestion/rhinnorhea. Mouth/Throat: Mucous membranes are moist.  Neck: No stridor.   Cardiovascular: Tachycardic, regular rhythm. Grossly normal heart sounds.   Respiratory: Labored respirations, using accessory muscles.  Coarse wheezing throughout all fields. Gastrointestinal: Soft and nontender. No distention.  Musculoskeletal: No lower extremity tenderness nor edema.  No joint effusions. Neurologic:  Normal speech and language. No gross focal neurologic deficits are appreciated. Skin:  Skin is warm, dry and intact. No rash noted. Psychiatric: Mood and affect are normal. Speech and behavior are normal.  ____________________________________________   LABS (all labs ordered are listed, but only abnormal results are displayed)  Labs Reviewed  BASIC METABOLIC PANEL - Abnormal; Notable for the following components:      Result Value   Glucose, Bld 134 (*)    All other components within normal limits  CBC - Abnormal; Notable for the following components:   WBC 14.7 (*)    All other components within normal limits  TROPONIN I   ____________________________________________  EKG  ED ECG REPORT I, Doran Stabler, the attending physician, personally viewed and interpreted this ECG.   Date: 07/16/2018  EKG Time: 1858  Rate: 111  Rhythm: sinus  tachycardia  Axis: Normal  Intervals:none  ST&T Change: No ST segment elevation or depression.  No abnormal T wave inversion.  ____________________________________________  RADIOLOGY  Right middle and left lower subsegmental atelectasis ____________________________________________   PROCEDURES  Procedure(s) performed:   Procedures  Critical Care performed:   ____________________________________________   INITIAL IMPRESSION / ASSESSMENT AND PLAN / ED COURSE  Pertinent labs & imaging results that were available during my care of the patient were reviewed by me and considered in my medical decision making (see chart for details).  Differential includes, but is not limited to, viral syndrome, bronchitis including COPD exacerbation, pneumonia, reactive airway disease including asthma, CHF including exacerbation with or without pulmonary/interstitial edema, pneumothorax, ACS, thoracic trauma, and pulmonary embolism. As part of my medical decision making, I reviewed the following data within the electronic MEDICAL RECORD NUMBER Notes from prior ED visits  ----------------------------------------- 10:28 PM on 07/16/2018 -----------------------------------------  Despite having steroids over  3 hours ago as well as duo nebs the patient is still with labored respirations with wheezing throughout.  She will be admitted to the hospital.  Signed out to Dr. Marcille Blanco.  Elevated white blood cell count.  Possibly related to the steroids.  However, possibly developing multifocal pneumonia especially after 1 month of symptoms.  She will be placed on ceftriaxone as well as azithromycin in the hospital. ____________________________________________   FINAL CLINICAL IMPRESSION(S) / ED DIAGNOSES  COPD exacerbation.  NEW MEDICATIONS STARTED DURING THIS VISIT:  New Prescriptions   No medications on file     Note:  This document was prepared using Dragon voice recognition software and may include  unintentional dictation errors.     Orbie Pyo, MD 07/16/18 2228

## 2018-07-16 NOTE — ED Notes (Signed)
Patient had DG chest performed today - results in [facility] system. Protocol DG chest discontinued.

## 2018-07-16 NOTE — ED Notes (Signed)
Stephanie Hess (daughter) [phone] for pts update

## 2018-07-16 NOTE — ED Notes (Signed)
EMS called to transport patient to [facility] ED 

## 2018-07-16 NOTE — ED Triage Notes (Signed)
Pt in via [facility] from home, reports acute onset shortness of breath, worsening productive cough.  Pt recently treated with antibiotics and steroids, reports feeling better until today.  Pt w/ hx COPD, continues to smoke.  Pt tachycardic, other vitals WDL, NAD noted at this time.

## 2018-07-16 NOTE — ED Triage Notes (Addendum)
FIRST NURSE NOTE-afebrile. On zpack but feeling worse. Got steroids from urgent care.  Improved with EMS.  Neb at urgent care. NAD at this time.  sats 95 % RA at check in.

## 2018-07-17 LAB — HEMOGLOBIN A1C
Hgb A1c MFr Bld: 6.1 % — ABNORMAL HIGH (ref 4.8–5.6)
Mean Plasma Glucose: 128.37 mg/dL

## 2018-07-17 LAB — TROPONIN I
Troponin I: <0.03 ng/mL (ref <0.03)
Troponin I: <0.03 ng/mL (ref <0.03)

## 2018-07-17 LAB — TSH: TSH: 0.67 u[IU]/mL (ref 0.350–4.500)

## 2018-07-17 MED ORDER — METHYLPREDNISOLONE SODIUM SUCC 125 MG IJ SOLR
60.0000 mg | INTRAMUSCULAR | Status: DC
  Administered 2018-07-17: 11:00:00 60 mg via INTRAVENOUS
  Filled 2018-07-17: qty 2

## 2018-07-17 MED ORDER — PREDNISONE 10 MG PO TABS: 40.0000 mg | ORAL_TABLET | Freq: Every day | ORAL | 0 refills | Status: AC

## 2018-07-17 MED ORDER — TIOTROPIUM BROMIDE MONOHYDRATE 18 MCG IN CAPS
18.0000 ug | ORAL_CAPSULE | Freq: Every day | RESPIRATORY_TRACT | Status: DC
  Administered 2018-07-17: 18 ug via RESPIRATORY_TRACT
  Filled 2018-07-17: qty 5

## 2018-07-17 MED ORDER — LEVOFLOXACIN 750 MG PO TABS: 750.0000 mg | ORAL_TABLET | Freq: Every day | ORAL | 0 refills | Status: DC

## 2018-07-17 MED ORDER — NICOTINE 21 MG/24HR TD PT24: 21.0000 mg | MEDICATED_PATCH | TRANSDERMAL | 1 refills | Status: DC

## 2018-07-17 NOTE — Plan of Care (Signed)
  Problem: Education: Goal: Knowledge of disease or condition will improve Outcome: Progressing Goal: Knowledge of the prescribed therapeutic regimen will improve Outcome: Progressing Goal: Individualized Educational Video(s) Outcome: Progressing   Problem: Respiratory: Goal: Ability to maintain a clear airway will improve Outcome: Progressing Goal: Levels of oxygenation will improve Outcome: Progressing Goal: Ability to maintain adequate ventilation will improve Outcome: Progressing   Problem: Activity: Goal: Risk for activity intolerance will decrease Outcome: Progressing   Problem: Pain Managment: Goal: General experience of comfort will improve Outcome: Progressing   Problem: Safety: Goal: Ability to remain free from injury will improve Outcome: Progressing

## 2018-07-17 NOTE — Discharge Summary (Signed)
[place] [facility] [place] NAME: Stephanie Hess    MR#:  [id number]  DATE OF BIRTH:  [birth year]  DATE OF ADMISSION:  07/16/2018 ADMITTING PHYSICIAN: Harrie Foreman, MD  DATE OF DISCHARGE: 07/17/2018  PRIMARY CARE PHYSICIAN: Lavera Guise, MD    ADMISSION DIAGNOSIS:  COPD exacerbation ([place]) [J44.1]  DISCHARGE DIAGNOSIS:  Active Problems:   Respiratory distress   SECONDARY DIAGNOSIS:   Past Medical History:  Diagnosis Date  . Cancer ([place])   . COPD (chronic obstructive pulmonary disease) ([place])   . Diverticula of colon   . GERD (gastroesophageal reflux disease)   . Hyperlipidemia   . Renal insufficiency    2009 or 2010    HOSPITAL COURSE:   67 year old female with history of COPD and tobacco dependence who presents with shortness of breath.  1.  Acute exacerbation of COPD due to acute bronchitis with CT concerning for mucous plug: Patient symptoms have improved.  She is now longer short of breath.  She continues to have a cough however this is her chronic cough. She will be discharged on her outpatient regimen of inhalers and prednisone. She will follow-up with her pulmonologist in 1 week. She will continue on Levaquin for acute bronchitis  2.Tobacco dependence: Patient is encouraged to quit smoking. Counseling was provided for 4 minutes.    DISCHARGE CONDITIONS AND DIET:   Stable for discharge on heart healthy diet  CONSULTS OBTAINED:    DRUG ALLERGIES:   Allergies  Allergen Reactions  . Penicillins Nausea And Vomiting and Other (See Comments)    Has patient had a PCN reaction causing immediate rash, facial/tongue/throat swelling, SOB or lightheadedness with hypotension: No Has patient had a PCN reaction causing severe rash involving mucus membranes or skin necrosis: No Has patient had a PCN reaction that required hospitalization: No Has patient had a PCN reaction occurring within the last 10 years: Yes If all of the  above answers are "NO", then may proceed with Cephalosporin use.   . Shellfish Allergy Nausea And Vomiting  . Sulfa Antibiotics Rash    Other reaction(s): Unknown  . Biaxin [Clarithromycin] Rash  . Amoxicillin-Pot Clavulanate Nausea And Vomiting  . Atorvastatin Rash  . Macrobid [Nitrofurantoin] Rash    DISCHARGE MEDICATIONS:   Allergies as of 07/17/2018      Reactions   Penicillins Nausea And Vomiting, Other (See Comments)   Has patient had a PCN reaction causing immediate rash, facial/tongue/throat swelling, SOB or lightheadedness with hypotension: No Has patient had a PCN reaction causing severe rash involving mucus membranes or skin necrosis: No Has patient had a PCN reaction that required hospitalization: No Has patient had a PCN reaction occurring within the last 10 years: Yes If all of the above answers are "NO", then may proceed with Cephalosporin use.   Shellfish Allergy Nausea And Vomiting   Sulfa Antibiotics Rash   Other reaction(s): Unknown   Biaxin [clarithromycin] Rash   Amoxicillin-pot Clavulanate Nausea And Vomiting   Atorvastatin Rash   Macrobid [nitrofurantoin] Rash      Medication List    TAKE these medications   aspirin EC 81 MG tablet Take 1 tablet (81 mg total) by mouth daily.   budesonide-formoterol 160-4.5 MCG/ACT inhaler Commonly known as:  SYMBICORT Inhale 2 puffs into the lungs 2 (two) times daily as needed (shortness of breath).   clorazepate 7.5 MG tablet Commonly known as:  TRANXENE Take 1 tablet (7.5 mg total) by mouth at bedtime as needed  for anxiety.   ibuprofen 800 MG tablet Commonly known as:  ADVIL,MOTRIN Take 1 tablet (800 mg total) by mouth every 8 (eight) hours as needed. What changed:  reasons to take this   levofloxacin 750 MG tablet Commonly known as:  LEVAQUIN Take 1 tablet (750 mg total) by mouth daily.   nicotine 21 mg/24hr patch Commonly known as:  NICODERM CQ - dosed in mg/24 hours Place 1 patch (21 mg total) onto the  skin daily.   nitroGLYCERIN 0.4 MG SL tablet Commonly known as:  NITROSTAT Place 1 tablet (0.4 mg total) under the tongue every 5 (five) minutes as needed. What changed:  reasons to take this   omeprazole 20 MG capsule Commonly known as:  PRILOSEC Take 20 mg by mouth daily.   predniSONE 10 MG tablet Commonly known as:  DELTASONE Take 4 tablets (40 mg total) by mouth daily with breakfast for 5 days.   Vitamin D (Ergocalciferol) 50000 units Caps capsule Commonly known as:  DRISDOL Take 1 capsule (50,000 Units total) by mouth 2 (two) times a week.         Today   CHIEF COMPLAINT:   Patient is doing well this morning.  She reports she feels much better.  She has a chronic cough but no fevers or chills.  No wheezing.   VITAL SIGNS:  Blood pressure 126/67, pulse (!) 105, temperature 98 F (36.7 C), temperature source Oral, resp. rate 18, height 5\' 2"  (1.575 m), weight 72.9 kg, SpO2 95 %.   REVIEW OF SYSTEMS:  Review of Systems  Constitutional: Negative.  Negative for chills, fever and malaise/fatigue.  HENT: Negative.  Negative for ear discharge, ear pain, hearing loss, nosebleeds and sore throat.   Eyes: Negative.  Negative for blurred vision and pain.  Respiratory: Positive for cough. Negative for hemoptysis, shortness of breath and wheezing.   Cardiovascular: Negative.  Negative for chest pain, palpitations and leg swelling.  Gastrointestinal: Negative.  Negative for abdominal pain, blood in stool, diarrhea, nausea and vomiting.  Genitourinary: Negative.  Negative for dysuria.  Musculoskeletal: Negative.  Negative for back pain.  Skin: Negative.   Neurological: Negative for dizziness, tremors, speech change, focal weakness, seizures and headaches.  Endo/Heme/Allergies: Negative.  Does not bruise/bleed easily.  Psychiatric/Behavioral: Negative.  Negative for depression, hallucinations and suicidal ideas.     PHYSICAL EXAMINATION:  GENERAL:  67 y.o.-year-old patient  lying in the bed with no acute distress.  NECK:  Supple, no jugular venous distention. No thyroid enlargement, no tenderness.  LUNGS: Normal breath sounds bilaterally, no, rales,rhonchi  No use of accessory muscles of respiration.  Good airflow with very minimal wheezing CARDIOVASCULAR: S1, S2 normal. No murmurs, rubs, or gallops.  ABDOMEN: Soft, non-tender, non-distended. Bowel sounds present. No organomegaly or mass.  EXTREMITIES: No pedal edema, cyanosis, or clubbing.  PSYCHIATRIC: The patient is alert and oriented x 3.  SKIN: No obvious rash, lesion, or ulcer.   DATA REVIEW:   CBC Recent Labs  Lab 07/16/18 1910  WBC 14.7*  HGB 13.7  HCT 39.4  PLT 277    Chemistries  Recent Labs  Lab 07/16/18 1910  NA 137  K 4.2  CL 105  CO2 26  GLUCOSE 134*  BUN 11  CREATININE 0.79  CALCIUM 9.7    Cardiac Enzymes Recent Labs  Lab 07/16/18 1910 07/17/18 0145 07/17/18 0742  TROPONINI <0.03 <0.03 <0.03    Microbiology Results  @[email or web address] @  RADIOLOGY:  Dg Chest 2 View  Result Date: 07/16/2018  CLINICAL DATA:  Cough and dyspnea EXAM: CHEST - 2 VIEW COMPARISON:  06/24/2017 CT FINDINGS: The heart size and mediastinal contours are within normal limits. Streaky parenchymal opacities in the right middle lobe medial left lower lobe compatible with atelectasis. No confluent pulmonary opacities to suggest pneumonia. No effusion or pneumothorax. The visualized skeletal structures are unremarkable. IMPRESSION: Right middle and left lower lobe subsegmental atelectasis. No active pulmonary disease. Electronically Signed   By: Ashley Royalty M.D.   On: 07/16/2018 17:43   Ct Angio Chest Pe W And/or Wo Contrast  Result Date: 07/16/2018 CLINICAL DATA:  One-month history of shortness of breath. History of COPD. Productive cough today. Treated urgent care today. Still short of breath. EXAM: CT ANGIOGRAPHY CHEST WITH CONTRAST TECHNIQUE: Multidetector CT imaging of the chest was performed using the  standard protocol during bolus administration of intravenous contrast. Multiplanar CT image reconstructions and MIPs were obtained to evaluate the vascular anatomy. CONTRAST:  48mL OMNIPAQUE IOHEXOL 350 MG/ML SOLN COMPARISON:  06/24/2017 FINDINGS: Cardiovascular: Good opacification of the central and segmental pulmonary arteries. No focal filling defects. No evidence of significant pulmonary embolus. Normal caliber thoracic aorta. No dissection. Scattered calcifications. Great vessel origins are patent. Normal heart size. No pericardial effusion. Coronary artery calcifications. Mediastinum/Nodes: Moderate esophageal hiatal hernia. Esophagus is mostly decompressed. No significant lymphadenopathy in the chest. Lungs/Pleura: Fluid or mucus fills bilateral lower lobe and right middle lobe and left lingular bronchi with mild bronchiectasis. Changes could indicate aspiration or mucous plugging. Mild apical emphysematous changes in the lungs. No consolidation or edema. No pleural effusions. No pneumothorax. Upper Abdomen: Diffuse fatty infiltration of the liver. No focal lesions. Musculoskeletal: Degenerative changes in the spine. No destructive bone lesions. Review of the MIP images confirms the above findings. IMPRESSION: 1. No evidence of significant pulmonary embolus. 2. Fluid or mucus fills bilateral lower lobe and right middle lobe/left lingular bronchi with mild bronchiectasis. Changes could indicate aspiration or mucous plugging. 3. Diffuse fatty infiltration of the liver. Aortic Atherosclerosis (ICD10-I70.0) and Emphysema (ICD10-J43.9). Electronically Signed   By: Lucienne Capers M.D.   On: 07/16/2018 23:51      Allergies as of 07/17/2018      Reactions   Penicillins Nausea And Vomiting, Other (See Comments)   Has patient had a PCN reaction causing immediate rash, facial/tongue/throat swelling, SOB or lightheadedness with hypotension: No Has patient had a PCN reaction causing severe rash involving mucus  membranes or skin necrosis: No Has patient had a PCN reaction that required hospitalization: No Has patient had a PCN reaction occurring within the last 10 years: Yes If all of the above answers are "NO", then may proceed with Cephalosporin use.   Shellfish Allergy Nausea And Vomiting   Sulfa Antibiotics Rash   Other reaction(s): Unknown   Biaxin [clarithromycin] Rash   Amoxicillin-pot Clavulanate Nausea And Vomiting   Atorvastatin Rash   Macrobid [nitrofurantoin] Rash      Medication List    TAKE these medications   aspirin EC 81 MG tablet Take 1 tablet (81 mg total) by mouth daily.   budesonide-formoterol 160-4.5 MCG/ACT inhaler Commonly known as:  SYMBICORT Inhale 2 puffs into the lungs 2 (two) times daily as needed (shortness of breath).   clorazepate 7.5 MG tablet Commonly known as:  TRANXENE Take 1 tablet (7.5 mg total) by mouth at bedtime as needed for anxiety.   ibuprofen 800 MG tablet Commonly known as:  ADVIL,MOTRIN Take 1 tablet (800 mg total) by mouth every 8 (eight) hours as  needed. What changed:  reasons to take this   levofloxacin 750 MG tablet Commonly known as:  LEVAQUIN Take 1 tablet (750 mg total) by mouth daily.   nicotine 21 mg/24hr patch Commonly known as:  NICODERM CQ - dosed in mg/24 hours Place 1 patch (21 mg total) onto the skin daily.   nitroGLYCERIN 0.4 MG SL tablet Commonly known as:  NITROSTAT Place 1 tablet (0.4 mg total) under the tongue every 5 (five) minutes as needed. What changed:  reasons to take this   omeprazole 20 MG capsule Commonly known as:  PRILOSEC Take 20 mg by mouth daily.   predniSONE 10 MG tablet Commonly known as:  DELTASONE Take 4 tablets (40 mg total) by mouth daily with breakfast for 5 days.   Vitamin D (Ergocalciferol) 50000 units Caps capsule Commonly known as:  DRISDOL Take 1 capsule (50,000 Units total) by mouth 2 (two) times a week.           Management plans discussed with the patient and she is  in agreement. Stable for discharge home  Patient should follow up with pcp  CODE STATUS:     Code Status Orders  (From admission, onward)         Start     Ordered   07/16/18 2348  Full code  Continuous     07/16/18 2347        Code Status History    This patient has a current code status but no historical code status.      TOTAL TIME TAKING CARE OF THIS PATIENT: 38 minutes.    Note: This dictation was prepared with Dragon dictation along with smaller phrase technology. Any transcriptional errors that result from this process are unintentional.  Aurianna Earlywine M.D on 07/17/2018 at 9:49 AM  Between 7am to 6pm - Pager - [phone] After 6pm go to [email or web address] - password [email or web address] [facility]  Office  [phone]  CC: Primary care physician; Lavera Guise, MD

## 2018-07-17 NOTE — Progress Notes (Signed)
Pt for discharge home. Alert. No resp distress. No 02 required.  Pt did not qualify for  Home 02.  Discharge instructions discussed with pt. meds  Discussed,diet ,activity, and f/u discussed.  Verbalizes understanding.. dtr at [facility] to transport home.

## 2018-07-17 NOTE — H&P (Signed)
Stephanie Hess is an 67 y.o. female.   Chief Complaint: Shortness of breath HPI: The patient with past medical history of COPD and tobacco abuse presents to the emergency department with dyspnea. She states that her shortness of breath became acute worse today.  She went to urgent care where she received Solumedrol but she began to feel as though she were getting strangled when trying to cough which prompted her to come to the hospital. Despite more steroids and breathing treatments she continued to be wheezy which prompted the emergency department staff to call the hospitalist service for admission.   Past Medical History:  Diagnosis Date  . Cancer ([place])   . COPD (chronic obstructive pulmonary disease) ([place])   . Diverticula of colon   . GERD (gastroesophageal reflux disease)   . Hyperlipidemia   . Renal insufficiency    2009 or 2010    Past Surgical History:  Procedure Laterality Date  . APPENDECTOMY    . CESAREAN SECTION    . ECTOPIC PREGNANCY SURGERY      Family History  Problem Relation Age of Onset  . Breast cancer Paternal Aunt   . Heart attack Mother    Social History:  reports that she has been smoking cigarettes. She has a 25.00 pack-year smoking history. She has never used smokeless tobacco. She reports that she drinks alcohol. She reports that she does not use drugs.  Allergies:  Allergies  Allergen Reactions  . Penicillins Nausea And Vomiting and Other (See Comments)    Has patient had a PCN reaction causing immediate rash, facial/tongue/throat swelling, SOB or lightheadedness with hypotension: No Has patient had a PCN reaction causing severe rash involving mucus membranes or skin necrosis: No Has patient had a PCN reaction that required hospitalization: No Has patient had a PCN reaction occurring within the last 10 years: Yes If all of the above answers are "NO", then may proceed with Cephalosporin use.   . Shellfish Allergy Nausea And Vomiting  . Sulfa  Antibiotics Rash    Other reaction(s): Unknown  . Biaxin [Clarithromycin] Rash  . Amoxicillin-Pot Clavulanate Nausea And Vomiting  . Atorvastatin Rash  . Macrobid [Nitrofurantoin] Rash    Medications Prior to Admission  Medication Sig Dispense Refill  . aspirin EC 81 MG tablet Take 1 tablet (81 mg total) by mouth daily. 90 tablet 3  . budesonide-formoterol (SYMBICORT) 160-4.5 MCG/ACT inhaler Inhale 2 puffs into the lungs 2 (two) times daily as needed (shortness of breath).     . clorazepate (TRANXENE) 7.5 MG tablet Take 1 tablet (7.5 mg total) by mouth at bedtime as needed for anxiety. 30 tablet 3  . ibuprofen (ADVIL,MOTRIN) 800 MG tablet Take 1 tablet (800 mg total) by mouth every 8 (eight) hours as needed. (Patient taking differently: Take 800 mg by mouth every 8 (eight) hours as needed for mild pain. ) 21 tablet 0  . omeprazole (PRILOSEC) 20 MG capsule Take 20 mg by mouth daily.    . Vitamin D, Ergocalciferol, (DRISDOL) 50000 units CAPS capsule Take 1 capsule (50,000 Units total) by mouth 2 (two) times a week. 30 capsule 2  . nitroGLYCERIN (NITROSTAT) 0.4 MG SL tablet Place 1 tablet (0.4 mg total) under the tongue every 5 (five) minutes as needed. (Patient taking differently: Place 0.4 mg under the tongue every 5 (five) minutes as needed for chest pain. ) 25 tablet 6    Results for orders placed or performed during the hospital encounter of 07/16/18 (from the past 48  hour(s))  Basic metabolic panel     Status: Abnormal   Collection Time: 07/16/18  7:10 PM  Result Value Ref Range   Sodium 137 135 - 145 mmol/L   Potassium 4.2 3.5 - 5.1 mmol/L   Chloride 105 98 - 111 mmol/L   CO2 26 22 - 32 mmol/L   Glucose, Bld 134 (H) 70 - 99 mg/dL   BUN 11 8 - 23 mg/dL   Creatinine, Ser 0.79 0.44 - 1.00 mg/dL   Calcium 9.7 8.9 - 10.3 mg/dL   GFR calc non Af Amer >60 >60 mL/min   GFR calc Af Amer >60 >60 mL/min    Comment: (NOTE) The eGFR has been calculated using the CKD EPI equation. This  calculation has not been validated in all clinical situations. eGFR's persistently <60 mL/min signify possible Chronic Kidney Disease.    Anion gap 6 5 - 15    Comment: Performed at [facility], [address] [place], [place] [address]  CBC     Status: Abnormal   Collection Time: 07/16/18  7:10 PM  Result Value Ref Range   WBC 14.7 (H) 3.6 - 11.0 K/uL   RBC 4.31 3.80 - 5.20 MIL/uL   Hemoglobin 13.7 12.0 - 16.0 g/dL   HCT 39.4 35.0 - 47.0 %   MCV 91.4 80.0 - 100.0 fL   MCH 31.7 26.0 - 34.0 pg   MCHC 34.7 32.0 - 36.0 g/dL   RDW 13.8 11.5 - 14.5 %   Platelets 277 150 - 440 K/uL    Comment: Performed at [facility], [address] [place], [place] [address]  Troponin I     Status: None   Collection Time: 07/16/18  7:10 PM  Result Value Ref Range   Troponin I <0.03 <0.03 ng/mL    Comment: Performed at [facility], [address] [place], [place] [address]   Dg Chest 2 View  Result Date: 07/16/2018 CLINICAL DATA:  Cough and dyspnea EXAM: CHEST - 2 VIEW COMPARISON:  06/24/2017 CT FINDINGS: The heart size and mediastinal contours are within normal limits. Streaky parenchymal opacities in the right middle lobe medial left lower lobe compatible with atelectasis. No confluent pulmonary opacities to suggest pneumonia. No effusion or pneumothorax. The visualized skeletal structures are unremarkable. IMPRESSION: Right middle and left lower lobe subsegmental atelectasis. No active pulmonary disease. Electronically Signed   By: Ashley Royalty M.D.   On: 07/16/2018 17:43   Ct Angio Chest Pe W And/or Wo Contrast  Result Date: 07/16/2018 CLINICAL DATA:  One-month history of shortness of breath. History of COPD. Productive cough today. Treated urgent care today. Still short of breath. EXAM: CT ANGIOGRAPHY CHEST WITH CONTRAST TECHNIQUE: Multidetector CT imaging of the chest was performed using the standard protocol during bolus administration of intravenous contrast.  Multiplanar CT image reconstructions and MIPs were obtained to evaluate the vascular anatomy. CONTRAST:  10m OMNIPAQUE IOHEXOL 350 MG/ML SOLN COMPARISON:  06/24/2017 FINDINGS: Cardiovascular: Good opacification of the central and segmental pulmonary arteries. No focal filling defects. No evidence of significant pulmonary embolus. Normal caliber thoracic aorta. No dissection. Scattered calcifications. Great vessel origins are patent. Normal heart size. No pericardial effusion. Coronary artery calcifications. Mediastinum/Nodes: Moderate esophageal hiatal hernia. Esophagus is mostly decompressed. No significant lymphadenopathy in the chest. Lungs/Pleura: Fluid or mucus fills bilateral lower lobe and right middle lobe and left lingular bronchi with mild bronchiectasis. Changes could indicate aspiration or mucous plugging. Mild apical emphysematous changes in the lungs. No consolidation  or edema. No pleural effusions. No pneumothorax. Upper Abdomen: Diffuse fatty infiltration of the liver. No focal lesions. Musculoskeletal: Degenerative changes in the spine. No destructive bone lesions. Review of the MIP images confirms the above findings. IMPRESSION: 1. No evidence of significant pulmonary embolus. 2. Fluid or mucus fills bilateral lower lobe and right middle lobe/left lingular bronchi with mild bronchiectasis. Changes could indicate aspiration or mucous plugging. 3. Diffuse fatty infiltration of the liver. Aortic Atherosclerosis (ICD10-I70.0) and Emphysema (ICD10-J43.9). Electronically Signed   By: Lucienne Capers M.D.   On: 07/16/2018 23:51    Review of Systems  Constitutional: Negative for chills and fever.  HENT: Negative for sore throat and tinnitus.   Eyes: Negative for blurred vision and redness.  Respiratory: Positive for cough, sputum production, shortness of breath and wheezing. Negative for hemoptysis.   Cardiovascular: Negative for chest pain, palpitations, orthopnea and PND.  Gastrointestinal:  Negative for abdominal pain, diarrhea, nausea and vomiting.  Genitourinary: Negative for dysuria, frequency and urgency.  Musculoskeletal: Negative for joint pain and myalgias.  Skin: Negative for rash.       No lesions  Neurological: Negative for speech change, focal weakness and weakness.  Endo/Heme/Allergies: Does not bruise/bleed easily.       No temperature intolerance  Psychiatric/Behavioral: Negative for depression and suicidal ideas.    Blood pressure 129/81, pulse (!) 115, temperature 97.6 F (36.4 C), temperature source Oral, resp. rate 18, height 5' 2"  (1.575 m), weight 72.6 kg, SpO2 96 %. Physical Exam  Vitals reviewed. Constitutional: She is oriented to person, place, and time. She appears well-developed and well-nourished. No distress.  HENT:  Head: Normocephalic and atraumatic.  Mouth/Throat: Oropharynx is clear and moist.  Eyes: Pupils are equal, round, and reactive to light. Conjunctivae and EOM are normal. No scleral icterus.  Neck: Normal range of motion. No tracheal deviation present. No thyromegaly present.  Cardiovascular: Normal rate, regular rhythm and normal heart sounds. Exam reveals no gallop and no friction rub.  No murmur heard. Respiratory: Effort normal. She has wheezes.  GI: Soft. Bowel sounds are normal. She exhibits no distension. There is no tenderness.  Genitourinary:  Genitourinary Comments: Deferred  Musculoskeletal: Normal range of motion. She exhibits no edema.  Lymphadenopathy:    She has no cervical adenopathy.  Neurological: She is alert and oriented to person, place, and time. No cranial nerve deficit. She exhibits normal muscle tone.  Skin: Skin is warm and dry. No rash noted. No erythema.  Lesion in center of upper chest with rolled borgers status post cryotherapy a few days ago  Psychiatric: She has a normal mood and affect. Her behavior is normal. Judgment and thought content normal.     Assessment/Plan This is a 67 year old female  admitted for respiratory distress. 1.  Respiratory distress: Increased work of breathing but no oxygen requirement.  The patient has had increased sputum production that is now thick yellow/green.  Chest x-ray showed spotty areas of atelectasis which prompted CTA which demonstrated no PE but did show fluid or mucous plugging and bronchiectasis.  She received Solu-Medrol at urgent care earlier this afternoon and we will continue steroid taper.  Albuterol scheduled. 2.  COPD: Continue inhaled corticosteroid as well as long-acting bronchial agonist.  I have added Spiriva.  The patient is completed a course of azithromycin. 3.  Hyperlipidemia: The patient does not appear to take medication for this at this time. 4.  Tobacco abuse: Counseled on smoking cessation.  She smokes 1/2 pack/day at this time  but has smoked more for the past 45 years.  Counseled on cancer risks. Recommend cancer screening 5.  DVT prophylaxis: early ambulation 6.  GI prophylaxis: None The patient is a full code.  Time spent on admission orders and patient care approximately 45 minutes  Harrie Foreman, MD 07/17/2018, 2:50 AM

## 2018-07-17 NOTE — Discharge Instructions (Signed)
Asthma Attack Prevention, Adult Although you may not be able to control the fact that you have asthma, you can take actions to prevent episodes of asthma (asthma attacks). These actions include:  Creating a written plan for managing and treating your asthma attacks (asthma action plan).  Monitoring your asthma.  Avoiding things that can irritate your airways or make your asthma symptoms worse (asthma triggers).  Taking your medicines as directed.  Acting quickly if you have signs or symptoms of an asthma attack. What are some ways to prevent an asthma attack? Create a plan Work with your health care provider to create an asthma action plan. This plan should include:  A list of your asthma triggers and how to avoid them.  A list of symptoms that you experience during an asthma attack.  Information about when to take medicine and how much medicine to take.  Information to help you understand your peak flow measurements.  Contact information for your health care providers.  Daily actions that you can take to control asthma. Monitor your asthma   To monitor your asthma:  Use your peak flow meter every morning and every evening for 2-3 weeks. Record the results in a journal. A drop in your peak flow numbers on one or more days may mean that you are starting to have an asthma attack, even if you are not having symptoms.  When you have asthma symptoms, write them down in a journal. Avoid asthma triggers   Work with your health care provider to find out what your asthma triggers are. This can be done by:  Being tested for allergies.  Keeping a journal that notes when asthma attacks occur and what may have contributed to them.  Asking your health care provider whether other medical conditions make your asthma worse. Common asthma triggers include:  Dust.  Smoke. This includes campfire smoke and secondhand smoke from tobacco products.  Pet dander.  Trees, grasses or  pollens.  Very cold, dry, or humid air.  Mold.  Foods that contain high amounts of sulfites.  Strong smells.  Engine exhaust and air pollution.  Aerosol sprays and fumes from household cleaners.  Household pests and their droppings, including dust mites and cockroaches.  Certain medicines, including NSAIDs. Once you have determined your asthma triggers, take steps to avoid them. Depending on your triggers, you may be able to reduce the chance of an asthma attack by:  Keeping your home clean. Have someone dust and vacuum your home for you 1 or 2 times a week. If possible, have them use a high-efficiency particulate arrestance (HEPA) vacuum.  Washing your sheets weekly in hot water.  Using allergy-proof mattress covers and casings on your bed.  Keeping pets out of your home.  Taking care of mold and water problems in your home.  Avoiding areas where people smoke.  Avoiding using strong perfumes or odor sprays.  Avoid spending a lot of time outdoors when pollen counts are high and on very windy days.  Talking with your health care provider before stopping or starting any new medicines. Medicines Take over-the-counter and prescription medicines only as told by your health care provider. Many asthma attacks can be prevented by carefully following your medicine schedule. Taking your medicines correctly is especially important when you cannot avoid certain asthma triggers. Even if you are doing well, do not stop taking your medicine and do not take less medicine. Act quickly If an asthma attack happens, acting quickly can decrease how severe   can decrease how severe it is and how long it lasts. Take these actions:  Pay attention to your symptoms. If you are coughing, wheezing, or having difficulty breathing, do not wait to see if your symptoms go away on their own. Follow your asthma action plan.  If you have followed your asthma action plan and your symptoms are not improving, call your health care  provider or seek immediate medical care at the nearest hospital.  It is important to write down how often you need to use your fast-acting rescue inhaler. You can track how often you use an inhaler in your journal. If you are using your rescue inhaler more often, it may mean that your asthma is not under control. Adjusting your asthma treatment plan may help you to prevent future asthma attacks and help you to gain better control of your condition. How can I prevent an asthma attack when I exercise?  Exercise is a common asthma trigger. To prevent asthma attacks during exercise:  Follow advice from your health care provider about whether you should use your fast-acting inhaler before exercising. Many people with asthma experience exercise-induced bronchoconstriction (EIB). This condition often worsens during vigorous exercise in cold, humid, or dry environments. Usually, people with EIB can stay very active by using a fast-acting inhaler before exercising.  Avoid exercising outdoors in very cold or humid weather.  Avoid exercising outdoors when pollen counts are high.  Warm up and cool down when exercising.  Stop exercising right away if asthma symptoms start.  Consider taking part in exercises that are less likely to cause asthma symptoms such as:  Indoor swimming.  Biking.  Walking.  Hiking.  Playing football.  This information is not intended to replace advice given to you by your health care provider. Make sure you discuss any questions you have with your health care provider. Document Released: 10/16/2009 Document Revised: 06/28/2016 Document Reviewed: 04/13/2016 Elsevier Interactive Patient Education  2018 [employer].   Asthma, Adult Asthma is a condition of the lungs in which the airways tighten and narrow. Asthma can make it hard to breathe. Asthma cannot be cured, but medicine and lifestyle changes can help control it. Asthma may be started (triggered) by:  Animal  skin flakes (dander).  Dust.  Cockroaches.  Pollen.  Mold.  Smoke.  Cleaning products.  Hair sprays or aerosol sprays.  Paint fumes or strong smells.  Cold air, weather changes, and winds.  Crying or laughing hard.  Stress.  Certain medicines or drugs.  Foods, such as dried fruit, potato chips, and sparkling grape juice.  Infections or conditions (colds, flu).  Exercise.  Certain medical conditions or diseases.  Exercise or tiring activities.  Follow these instructions at home:  Take medicine as told by your doctor.  Use a peak flow meter as told by your doctor. A peak flow meter is a tool that measures how well the lungs are working.  Record and keep track of the peak flow meter's readings.  Understand and use the asthma action plan. An asthma action plan is a written plan for taking care of your asthma and treating your attacks.  To help prevent asthma attacks: ? Do not smoke. Stay away from secondhand smoke. ? Change your heating and air conditioning filter often. ? Limit your use of fireplaces and wood stoves. ? Get rid of pests (such as roaches and mice) and their droppings. ? Throw away plants if you see mold on them. ? Clean your floors. Dust regularly.  Use cleaning products that do not smell. ? Have someone vacuum when you are not home. Use a vacuum cleaner with a HEPA filter if possible. ? Replace carpet with wood, tile, or vinyl flooring. Carpet can trap animal skin flakes and dust. ? Use allergy-proof pillows, mattress covers, and box spring covers. ? Wash bed sheets and blankets every week in hot water and dry them in a dryer. ? Use blankets that are made of polyester or cotton. ? Clean bathrooms and kitchens with bleach. If possible, have someone repaint the walls in these rooms with mold-resistant paint. Keep out of the rooms that are being cleaned and painted. ? Wash hands often. Contact a doctor if:  You have make a whistling sound when  breaking (wheeze), have shortness of breath, or have a cough even if taking medicine to prevent attacks.  The colored mucus you cough up (sputum) is thicker than usual.  The colored mucus you cough up changes from clear or white to yellow, green, gray, or bloody.  You have problems from the medicine you are taking such as: ? A rash. ? Itching. ? Swelling. ? Trouble breathing.  You need reliever medicines more than 2-3 times a week.  Your peak flow measurement is still at 50-79% of your personal best after following the action plan for 1 hour.  You have a fever. Get help right away if:  You seem to be worse and are not responding to medicine during an asthma attack.  You are short of breath even at rest.  You get short of breath when doing very little activity.  You have trouble eating, drinking, or talking.  You have chest pain.  You have a fast heartbeat.  Your lips or fingernails start to turn blue.  You are light-headed, dizzy, or faint.  Your peak flow is less than 50% of your personal best. This information is not intended to replace advice given to you by your health care provider. Make sure you discuss any questions you have with your health care provider. Document Released: 04/15/2008 Document Revised: 04/04/2016 Document Reviewed: 05/27/2013 Elsevier Interactive Patient Education  2017 [employer].

## 2018-07-17 NOTE — Progress Notes (Signed)
Flutter and IS education complete, pt and daughter understands technique and reason for use. Pt inspire 1554ml. Pt to take home and use

## 2018-07-21 ENCOUNTER — Other Ambulatory Visit: Payer: Self-pay | Admitting: *Deleted

## 2018-07-21 DIAGNOSIS — J441 Chronic obstructive pulmonary disease with (acute) exacerbation: Secondary | ICD-10-CM

## 2018-07-28 ENCOUNTER — Ambulatory Visit: Payer: 59 | Admitting: Internal Medicine

## 2018-07-28 ENCOUNTER — Telehealth: Payer: Self-pay | Admitting: Obstetrics and Gynecology

## 2018-07-28 ENCOUNTER — Encounter: Payer: Self-pay | Admitting: Internal Medicine

## 2018-07-28 ENCOUNTER — Other Ambulatory Visit: Payer: Self-pay | Admitting: *Deleted

## 2018-07-28 VITALS — BP 132/88 | HR 115 | Resp 16 | Ht 62.0 in | Wt 161.0 lb

## 2018-07-28 DIAGNOSIS — R0602 Shortness of breath: Secondary | ICD-10-CM

## 2018-07-28 DIAGNOSIS — Z720 Tobacco use: Secondary | ICD-10-CM

## 2018-07-28 DIAGNOSIS — J470 Bronchiectasis with acute lower respiratory infection: Secondary | ICD-10-CM | POA: Diagnosis not present

## 2018-07-28 DIAGNOSIS — J4520 Mild intermittent asthma, uncomplicated: Secondary | ICD-10-CM

## 2018-07-28 DIAGNOSIS — K219 Gastro-esophageal reflux disease without esophagitis: Secondary | ICD-10-CM | POA: Diagnosis not present

## 2018-07-28 MED ORDER — CLORAZEPATE DIPOTASSIUM 7.5 MG PO TABS: 7.5000 mg | ORAL_TABLET | Freq: Every evening | ORAL | 1 refills | Status: DC | PRN

## 2018-07-28 NOTE — Telephone Encounter (Signed)
The patient called and stated that she needs a refill of the medication clorazepate (TRANXENE) 7.5 MG tablet, The patient did not disclose any other information. Please advise.

## 2018-07-28 NOTE — Progress Notes (Signed)
[facility] [place], [place] [address]  Pulmonary Sleep Medicine   Office Visit Note  Patient Name: Stephanie Hess DOB: 09-04-1951 MRN [id number]  Date of Service: 07/28/2018  Complaints/HPI: Pt here for hospital follow up.  She was admitted for one day due to copd exacerbation.  She was placed on Levaquin.  She has completed the antibiotics and reports she is feeling better. She reports she is still smoking, approximately 3 cigarettes a day.    ROS  General: (-) fever, (-) chills, (-) night sweats, (-) weakness Skin: (-) rashes, (-) itching,. Eyes: (-) visual changes, (-) redness, (-) itching. Nose and Sinuses: (-) nasal stuffiness or itchiness, (-) postnasal drip, (-) nosebleeds, (-) sinus trouble. Mouth and Throat: (-) sore throat, (-) hoarseness. Neck: (-) swollen glands, (-) enlarged thyroid, (-) neck pain. Respiratory: - cough, (-) bloody sputum, - shortness of breath, - wheezing. Cardiovascular: - ankle swelling, (-) chest pain. Lymphatic: (-) lymph node enlargement. Neurologic: (-) numbness, (-) tingling. Psychiatric: (-) anxiety, (-) depression   Current Medication: Outpatient Encounter Medications as of 07/28/2018  Medication Sig  . aspirin EC 81 MG tablet Take 1 tablet (81 mg total) by mouth daily.  . budesonide-formoterol (SYMBICORT) 160-4.5 MCG/ACT inhaler Inhale 2 puffs into the lungs 2 (two) times daily as needed (shortness of breath).   . clorazepate (TRANXENE) 7.5 MG tablet Take 1 tablet (7.5 mg total) by mouth at bedtime as needed for anxiety.  Marland Kitchen ibuprofen (ADVIL,MOTRIN) 800 MG tablet Take 1 tablet (800 mg total) by mouth every 8 (eight) hours as needed. (Patient taking differently: Take 800 mg by mouth every 8 (eight) hours as needed for mild pain. )  . omeprazole (PRILOSEC) 20 MG capsule Take 20 mg by mouth daily.  . Vitamin D, Ergocalciferol, (DRISDOL) 50000 units CAPS capsule Take 1 capsule (50,000 Units total) by mouth 2 (two)  times a week.  Marland Kitchen levofloxacin (LEVAQUIN) 750 MG tablet Take 1 tablet (750 mg total) by mouth daily. (Patient not taking: Reported on 07/28/2018)  . nicotine (NICODERM CQ - DOSED IN MG/24 HOURS) 21 mg/24hr patch Place 1 patch (21 mg total) onto the skin daily. (Patient not taking: Reported on 07/28/2018)  . nitroGLYCERIN (NITROSTAT) 0.4 MG SL tablet Place 1 tablet (0.4 mg total) under the tongue every 5 (five) minutes as needed. (Patient not taking: Reported on 07/28/2018)   No facility-administered encounter medications on file as of 07/28/2018.     Surgical History: Past Surgical History:  Procedure Laterality Date  . APPENDECTOMY    . CESAREAN SECTION    . ECTOPIC PREGNANCY SURGERY      Medical History: Past Medical History:  Diagnosis Date  . Cancer ([place])   . COPD (chronic obstructive pulmonary disease) ([place])   . Diverticula of colon   . GERD (gastroesophageal reflux disease)   . Hyperlipidemia   . Renal insufficiency    2009 or 2010    Family History: Family History  Problem Relation Age of Onset  . Breast cancer Paternal Aunt   . Heart attack Mother     Social History: Social History   Socioeconomic History  . Marital status: Divorced    Spouse name: Not on file  . Number of children: Not on file  . Years of education: Not on file  . Highest education level: Not on file  Occupational History  . Not on file  Social Needs  . Financial resource strain: Not on file  . Food insecurity:  Worry: Not on file    Inability: Not on file  . Transportation needs:    Medical: Not on file    Non-medical: Not on file  Tobacco Use  . Smoking status: Current Every Day Smoker    Packs/day: 0.50    Years: 50.00    Pack years: 25.00    Types: Cigarettes  . Smokeless tobacco: Never Used  Substance and Sexual Activity  . Alcohol use: Yes    Comment: social  . Drug use: No  . Sexual activity: Never  Lifestyle  . Physical activity:    Days per week: Not on file     Minutes per session: Not on file  . Stress: Not on file  Relationships  . Social connections:    Talks on phone: Not on file    Gets together: Not on file    Attends religious service: Not on file    Active member of club or organization: Not on file    Attends meetings of clubs or organizations: Not on file    Relationship status: Not on file  . Intimate partner violence:    Fear of current or ex partner: Not on file    Emotionally abused: Not on file    Physically abused: Not on file    Forced sexual activity: Not on file  Other Topics Concern  . Not on file  Social History Narrative  . Not on file    Vital Signs: Blood pressure 132/88, pulse (!) 115, resp. rate 16, height 5' 2"  (1.575 m), weight 161 lb (73 kg), SpO2 93 %.  Examination: General Appearance: The patient is well-developed, well-nourished, and in no distress. Skin: Gross inspection of skin unremarkable. Head: normocephalic, no gross deformities. Eyes: no gross deformities noted. ENT: ears appear grossly normal no exudates. Neck: Supple. No thyromegaly. No LAD. Respiratory: Clear  With good air movement, however expiratory wheeze noted. Cardiovascular: Normal S1 and S2 without murmur or rub. Extremities: No cyanosis. pulses are equal. Neurologic: Alert and oriented. No involuntary movements.  LABS: Recent Results (from the past 2160 hour(s))  Basic metabolic panel     Status: Abnormal   Collection Time: 07/16/18  7:10 PM  Result Value Ref Range   Sodium 137 135 - 145 mmol/L   Potassium 4.2 3.5 - 5.1 mmol/L   Chloride 105 98 - 111 mmol/L   CO2 26 22 - 32 mmol/L   Glucose, Bld 134 (H) 70 - 99 mg/dL   BUN 11 8 - 23 mg/dL   Creatinine, Ser 0.79 0.44 - 1.00 mg/dL   Calcium 9.7 8.9 - 10.3 mg/dL   GFR calc non Af Amer >60 >60 mL/min   GFR calc Af Amer >60 >60 mL/min    Comment: (NOTE) The eGFR has been calculated using the CKD EPI equation. This calculation has not been validated in all clinical  situations. eGFR's persistently <60 mL/min signify possible Chronic Kidney Disease.    Anion gap 6 5 - 15    Comment: Performed at [facility], [address] [place], [place] [address]  CBC     Status: Abnormal   Collection Time: 07/16/18  7:10 PM  Result Value Ref Range   WBC 14.7 (H) 3.6 - 11.0 K/uL   RBC 4.31 3.80 - 5.20 MIL/uL   Hemoglobin 13.7 12.0 - 16.0 g/dL   HCT 39.4 35.0 - 47.0 %   MCV 91.4 80.0 - 100.0 fL   MCH 31.7 26.0 - 34.0 pg   MCHC  34.7 32.0 - 36.0 g/dL   RDW 13.8 11.5 - 14.5 %   Platelets 277 150 - 440 K/uL    Comment: Performed at [facility], [address] [place], [place] [address]  Troponin I     Status: None   Collection Time: 07/16/18  7:10 PM  Result Value Ref Range   Troponin I <0.03 <0.03 ng/mL    Comment: Performed at [facility], [address] [place], [place] [address]  TSH     Status: None   Collection Time: 07/17/18  1:45 AM  Result Value Ref Range   TSH 0.670 0.350 - 4.500 uIU/mL    Comment: Performed by a 3rd Generation assay with a functional sensitivity of <=0.01 uIU/mL. Performed at [facility], [address] [place], [place] [address]   Hemoglobin A1c     Status: Abnormal   Collection Time: 07/17/18  1:45 AM  Result Value Ref Range   Hgb A1c MFr Bld 6.1 (H) 4.8 - 5.6 %    Comment: (NOTE) Pre diabetes:          5.7%-6.4% Diabetes:              >6.4% Glycemic control for   <7.0% adults with diabetes    Mean Plasma Glucose 128.37 mg/dL    Comment: Performed at [facility] [address], [place], [place] [address]  Troponin I     Status: None   Collection Time: 07/17/18  1:45 AM  Result Value Ref Range   Troponin I <0.03 <0.03 ng/mL    Comment: Performed at [facility], [address] [place], [place] [address]  Troponin I     Status: None   Collection Time: 07/17/18  7:42 AM  Result Value Ref Range   Troponin I <0.03 <0.03 ng/mL    Comment: Performed at  [facility], [address] [place], [place] [address]    Radiology: Dg Chest 2 View  Result Date: 07/16/2018 CLINICAL DATA:  Cough and dyspnea EXAM: CHEST - 2 VIEW COMPARISON:  06/24/2017 CT FINDINGS: The heart size and mediastinal contours are within normal limits. Streaky parenchymal opacities in the right middle lobe medial left lower lobe compatible with atelectasis. No confluent pulmonary opacities to suggest pneumonia. No effusion or pneumothorax. The visualized skeletal structures are unremarkable. IMPRESSION: Right middle and left lower lobe subsegmental atelectasis. No active pulmonary disease. Electronically Signed   By: Ashley Royalty M.D.   On: 07/16/2018 17:43   Ct Angio Chest Pe W And/or Wo Contrast  Result Date: 07/16/2018 CLINICAL DATA:  One-month history of shortness of breath. History of COPD. Productive cough today. Treated urgent care today. Still short of breath. EXAM: CT ANGIOGRAPHY CHEST WITH CONTRAST TECHNIQUE: Multidetector CT imaging of the chest was performed using the standard protocol during bolus administration of intravenous contrast. Multiplanar CT image reconstructions and MIPs were obtained to evaluate the vascular anatomy. CONTRAST:  37m OMNIPAQUE IOHEXOL 350 MG/ML SOLN COMPARISON:  06/24/2017 FINDINGS: Cardiovascular: Good opacification of the central and segmental pulmonary arteries. No focal filling defects. No evidence of significant pulmonary embolus. Normal caliber thoracic aorta. No dissection. Scattered calcifications. Great vessel origins are patent. Normal heart size. No pericardial effusion. Coronary artery calcifications. Mediastinum/Nodes: Moderate esophageal hiatal hernia. Esophagus is mostly decompressed. No significant lymphadenopathy in the chest. Lungs/Pleura: Fluid or mucus fills bilateral lower lobe and right middle lobe and left lingular bronchi with mild bronchiectasis. Changes could indicate aspiration or mucous plugging. Mild  apical  emphysematous changes in the lungs. No consolidation or edema. No pleural effusions. No pneumothorax. Upper Abdomen: Diffuse fatty infiltration of the liver. No focal lesions. Musculoskeletal: Degenerative changes in the spine. No destructive bone lesions. Review of the MIP images confirms the above findings. IMPRESSION: 1. No evidence of significant pulmonary embolus. 2. Fluid or mucus fills bilateral lower lobe and right middle lobe/left lingular bronchi with mild bronchiectasis. Changes could indicate aspiration or mucous plugging. 3. Diffuse fatty infiltration of the liver. Aortic Atherosclerosis (ICD10-I70.0) and Emphysema (ICD10-J43.9). Electronically Signed   By: Lucienne Capers M.D.   On: 07/16/2018 23:51    No results found.  Dg Chest 2 View  Result Date: 07/16/2018 CLINICAL DATA:  Cough and dyspnea EXAM: CHEST - 2 VIEW COMPARISON:  06/24/2017 CT FINDINGS: The heart size and mediastinal contours are within normal limits. Streaky parenchymal opacities in the right middle lobe medial left lower lobe compatible with atelectasis. No confluent pulmonary opacities to suggest pneumonia. No effusion or pneumothorax. The visualized skeletal structures are unremarkable. IMPRESSION: Right middle and left lower lobe subsegmental atelectasis. No active pulmonary disease. Electronically Signed   By: Ashley Royalty M.D.   On: 07/16/2018 17:43   Ct Angio Chest Pe W And/or Wo Contrast  Result Date: 07/16/2018 CLINICAL DATA:  One-month history of shortness of breath. History of COPD. Productive cough today. Treated urgent care today. Still short of breath. EXAM: CT ANGIOGRAPHY CHEST WITH CONTRAST TECHNIQUE: Multidetector CT imaging of the chest was performed using the standard protocol during bolus administration of intravenous contrast. Multiplanar CT image reconstructions and MIPs were obtained to evaluate the vascular anatomy. CONTRAST:  20m OMNIPAQUE IOHEXOL 350 MG/ML SOLN COMPARISON:  06/24/2017 FINDINGS:  Cardiovascular: Good opacification of the central and segmental pulmonary arteries. No focal filling defects. No evidence of significant pulmonary embolus. Normal caliber thoracic aorta. No dissection. Scattered calcifications. Great vessel origins are patent. Normal heart size. No pericardial effusion. Coronary artery calcifications. Mediastinum/Nodes: Moderate esophageal hiatal hernia. Esophagus is mostly decompressed. No significant lymphadenopathy in the chest. Lungs/Pleura: Fluid or mucus fills bilateral lower lobe and right middle lobe and left lingular bronchi with mild bronchiectasis. Changes could indicate aspiration or mucous plugging. Mild apical emphysematous changes in the lungs. No consolidation or edema. No pleural effusions. No pneumothorax. Upper Abdomen: Diffuse fatty infiltration of the liver. No focal lesions. Musculoskeletal: Degenerative changes in the spine. No destructive bone lesions. Review of the MIP images confirms the above findings. IMPRESSION: 1. No evidence of significant pulmonary embolus. 2. Fluid or mucus fills bilateral lower lobe and right middle lobe/left lingular bronchi with mild bronchiectasis. Changes could indicate aspiration or mucous plugging. 3. Diffuse fatty infiltration of the liver. Aortic Atherosclerosis (ICD10-I70.0) and Emphysema (ICD10-J43.9). Electronically Signed   By: WLucienne CapersM.D.   On: 07/16/2018 23:51      Assessment and Plan: Patient Active Problem List   Diagnosis Date Noted  . Respiratory distress 07/16/2018  . [place] 03/03/2018  . Nicotine abuse 03/03/2018  . Mild intermittent asthma without complication 011/01/1593 . Pulmonary nodule 03/03/2018  . Gastroesophageal reflux disease without esophagitis 03/03/2018  . Hyperlipidemia 05/15/2017  . Vitamin D deficiency 05/15/2017    1. Bronchiectasis with acute lower respiratory infection ([place] Continue using Symbicort as directed.  Doing better at this time.   2. Mild intermittent asthma without complication Stable. Pt reports waking up intermittently with SOB at times.  Will do overnight oximetry.  -Overnight Oximetry, Future.  3.  Gastroesophageal reflux disease without esophagitis Continue Prilosec as directed.   4. Nicotine abuse Smoking cessation counseling: 1. Pt acknowledges the risks of long term smoking, she will try to quite smoking. 2. Options for different medications including nicotine products, chewing gum, patch etc, Wellbutrin and Chantix is discussed 3. Goal and date of compete cessation is discussed 4. Total time spent in smoking cessation is 15 min.   General Counseling: I have discussed the findings of the evaluation and examination with [place].  I have also discussed any further diagnostic evaluation thatmay be needed or ordered today. Shalee verbalizes understanding of the findings of todays visit. We also reviewed her medications today and discussed drug interactions and side effects including but not limited excessive drowsiness and altered mental states. We also discussed that there is always a risk not just to her but also people around her. she has been encouraged to call the office with any questions or concerns that should arise related to todays visit.    Time spent: 25 This patient was seen by Orson Gear AGNP-C in Collaboration with Dr. Devona Konig as a part of collaborative care agreement.   I have personally obtained a history, examined the patient, evaluated laboratory and imaging results, formulated the assessment and plan and placed orders.    Allyne Gee, MD [facility] Pulmonary and Critical Care Sleep medicine

## 2018-07-28 NOTE — Telephone Encounter (Signed)
Done-ac 

## 2018-07-29 ENCOUNTER — Telehealth: Payer: Self-pay

## 2018-07-29 NOTE — Telephone Encounter (Signed)
Gave [employer] overnight oximetry test order form RX. Beth

## 2018-08-01 ENCOUNTER — Encounter: Payer: Self-pay | Admitting: Internal Medicine

## 2018-08-01 NOTE — Patient Instructions (Signed)

## 2018-08-04 ENCOUNTER — Encounter: Payer: Self-pay | Admitting: Internal Medicine

## 2018-08-04 NOTE — Progress Notes (Signed)
SCANNED IN ORDER FOR OVERNIGHT PULSE OXIMETER.

## 2018-08-07 ENCOUNTER — Encounter: Payer: 59 | Admitting: Obstetrics and Gynecology

## 2018-08-07 ENCOUNTER — Encounter: Payer: Self-pay | Admitting: Internal Medicine

## 2018-08-07 NOTE — Progress Notes (Signed)
SCANNED IN 6 MIN WALK DONE ON 07/28/18.

## 2018-08-12 ENCOUNTER — Ambulatory Visit (INDEPENDENT_AMBULATORY_CARE_PROVIDER_SITE_OTHER): Payer: 59 | Admitting: Obstetrics and Gynecology

## 2018-08-12 ENCOUNTER — Encounter: Payer: Self-pay | Admitting: Obstetrics and Gynecology

## 2018-08-12 ENCOUNTER — Ambulatory Visit
Admission: RE | Admit: 2018-08-12 | Discharge: 2018-08-12 | Disposition: A | Discharge status: Home / Self Care | Payer: 59 | Source: Ambulatory Visit | Attending: Obstetrics and Gynecology | Admitting: Obstetrics and Gynecology

## 2018-08-12 VITALS — BP 121/82 | HR 114 | Ht 62.0 in | Wt 160.3 lb

## 2018-08-12 DIAGNOSIS — R7303 Prediabetes: Secondary | ICD-10-CM | POA: Diagnosis not present

## 2018-08-12 DIAGNOSIS — E782 Mixed hyperlipidemia: Secondary | ICD-10-CM

## 2018-08-12 DIAGNOSIS — E559 Vitamin D deficiency, unspecified: Secondary | ICD-10-CM | POA: Diagnosis not present

## 2018-08-12 DIAGNOSIS — Z1231 Encounter for screening mammogram for malignant neoplasm of breast: Secondary | ICD-10-CM | POA: Diagnosis not present

## 2018-08-12 DIAGNOSIS — Z01419 Encounter for gynecological examination (general) (routine) without abnormal findings: Secondary | ICD-10-CM

## 2018-08-12 MED ORDER — CLORAZEPATE DIPOTASSIUM 7.5 MG PO TABS: 7.5000 mg | ORAL_TABLET | Freq: Every evening | ORAL | 1 refills | Status: DC | PRN

## 2018-08-12 MED ORDER — VITAMIN D (ERGOCALCIFEROL) 1.25 MG (50000 UNIT) PO CAPS: 50000.0000 [IU] | ORAL_CAPSULE | ORAL | 2 refills | Status: DC

## 2018-08-12 NOTE — Patient Instructions (Signed)
Prediabetes Prediabetes is the condition of having a blood sugar (blood glucose) level that is higher than it should be, but not high enough for you to be diagnosed with type 2 diabetes. Having prediabetes puts you at risk for developing type 2 diabetes (type 2 diabetes mellitus). Prediabetes may be called impaired glucose tolerance or impaired fasting glucose. Prediabetes usually does not cause symptoms. Your health care provider can diagnose this condition with blood tests. You may be tested for prediabetes if you are overweight and if you have at least one other risk factor for prediabetes. Risk factors for prediabetes include:  Having a family member with type 2 diabetes.  Being overweight or obese.  Being older than age 57.  Being of American-Indian, African-American, Hispanic/Latino, or Asian/Pacific Islander descent.  Having an inactive (sedentary) lifestyle.  Having a history of gestational diabetes or polycystic ovarian syndrome (PCOS).  Having low levels of good cholesterol (HDL-C) or high levels of blood fats (triglycerides).  Having high blood pressure.  What is blood glucose and how is blood glucose measured?  Blood glucose refers to the amount of glucose in your bloodstream. Glucose comes from eating foods that contain sugars and starches (carbohydrates) that the body breaks down into glucose. Your blood glucose level may be measured in mg/dL (milligrams per deciliter) or mmol/L (millimoles per liter).Your blood glucose may be checked with one or more of the following blood tests:  A fasting blood glucose (FBG) test. You will not be allowed to eat (you will fast) for at least 8 hours before a blood sample is taken. ? A normal range for FBG is 70-100 mg/dl (3.9-5.6 mmol/L).  An A1c (hemoglobin A1c) blood test. This test provides information about blood glucose control over the previous 2?68month.  An oral glucose tolerance test (OGTT). This test measures your blood  glucose twice: ? After fasting. This is your baseline level. ? Two hours after you drink a beverage that contains glucose.  You may be diagnosed with prediabetes:  If your FBG is 100?125 mg/dL (5.6-6.9 mmol/L).  If your A1c level is 5.7?6.4%.  If your OGGT result is 140?199 mg/dL (7.8-11 mmol/L).  These blood tests may be repeated to confirm your diagnosis. What happens if blood glucose is too high? The pancreas produces a hormone (insulin) that helps move glucose from the bloodstream into cells. When cells in the body do not respond properly to insulin that the body makes (insulin resistance), excess glucose builds up in the blood instead of going into cells. As a result, high blood glucose (hyperglycemia) can develop, which can cause many complications. This is a symptom of prediabetes. What can happen if blood glucose stays higher than normal for a long time? Having high blood glucose for a long time is dangerous. Too much glucose in your blood can damage your nerves and blood vessels. Long-term damage can lead to complications from diabetes, which may include:  Heart disease.  Stroke.  Blindness.  Kidney disease.  Depression.  Poor circulation in the feet and legs, which could lead to surgical removal (amputation) in severe cases.  How can prediabetes be prevented from turning into type 2 diabetes?  To help prevent type 2 diabetes, take the following actions:  Be physically active. ? Do moderate-intensity physical activity for at least 30 minutes on at least 5 days of the week, or as much as told by your health care provider. This could be brisk walking, biking, or water aerobics. ? Ask your health care provider what  activities are safe for you. A mix of physical activities may be best, such as walking, swimming, cycling, and strength training.  Lose weight as told by your health care provider. ? Losing 5-7% of your body weight can reverse insulin resistance. ? Your health  care provider can determine how much weight loss is best for you and can help you lose weight safely.  Follow a healthy meal plan. This includes eating lean proteins, complex carbohydrates, fresh fruits and vegetables, low-fat dairy products, and healthy fats. ? Follow instructions from your health care provider about eating or drinking restrictions. ? Make an appointment to see a diet and nutrition specialist (registered dietitian) to help you create a healthy eating plan that is right for you.  Do not smoke or use any tobacco products, such as cigarettes, chewing tobacco, and e-cigarettes. If you need help quitting, ask your health care provider.  Take over-the-counter and prescription medicines as told by your health care provider. You may be prescribed medicines that help lower the risk of type 2 diabetes.  This information is not intended to replace advice given to you by your health care provider. Make sure you discuss any questions you have with your health care provider. Document Released: 02/19/2016 Document Revised: 04/04/2016 Document Reviewed: 12/19/2015 Elsevier Interactive Patient Education  2018 [employer]. Cholesterol Cholesterol is a fat. Your body needs a small amount of cholesterol. Cholesterol (plaque) may build up in your blood vessels (arteries). That makes you more likely to have a heart attack or stroke. You cannot feel your cholesterol level. Having a blood test is the only way to find out if your level is high. Keep your test results. Work with your doctor to keep your cholesterol at a good level. What do the results mean?  Total cholesterol is how much cholesterol is in your blood.  LDL is bad cholesterol. This is the type that can build up. Try to have low LDL.  HDL is good cholesterol. It cleans your blood vessels and carries LDL away. Try to have high HDL.  Triglycerides are fat that the body can store or burn for energy. What are good levels of  cholesterol?  Total cholesterol below 200.  LDL below 100 is good for people who have health risks. LDL below 70 is good for people who have very high risks.  HDL above 40 is good. It is best to have HDL of 60 or higher.  Triglycerides below 150. How can I lower my cholesterol? Diet Follow your diet program as told by your doctor.  Choose fish, white meat chicken, or [place] that is roasted or baked. Try not to eat red meat, fried foods, sausage, or lunch meats.  Eat lots of fresh fruits and vegetables.  Choose whole grains, beans, pasta, potatoes, and cereals.  Choose olive oil, corn oil, or canola oil. Only use small amounts.  Try not to eat butter, mayonnaise, shortening, or palm kernel oils.  Try not to eat foods with trans fats.  Choose low-fat or nonfat dairy foods. ? Drink skim or nonfat milk. ? Eat low-fat or nonfat yogurt and cheeses. ? Try not to drink whole milk or cream. ? Try not to eat ice cream, egg yolks, or full-fat cheeses.  Healthy desserts include angel food cake, ginger snaps, animal crackers, hard candy, popsicles, and low-fat or nonfat frozen yogurt. Try not to eat pastries, cakes, pies, and cookies.  Exercise Follow your exercise program as told by your doctor.  Be more active.  Try gardening, walking, and taking the stairs.  Ask your doctor about ways that you can be more active.  Medicine  Take over-the-counter and prescription medicines only as told by your doctor. This information is not intended to replace advice given to you by your health care provider. Make sure you discuss any questions you have with your health care provider. Document Released: 01/24/2009 Document Revised: 05/29/2016 Document Reviewed: 05/09/2016 Elsevier Interactive Patient Education  2018 [employer]. Vitamin D Deficiency Vitamin D deficiency is when your body does not have enough vitamin D. Vitamin D is important to your body for many reasons:  It helps the body to  absorb two important minerals, called calcium and phosphorus.  It plays a role in bone health.  It may help to prevent some diseases, such as diabetes and multiple sclerosis.  It plays a role in muscle function, including heart function.  You can get vitamin D by:  Eating foods that naturally contain vitamin D.  Eating or drinking milk or other dairy products that have vitamin D added to them.  Taking a vitamin D supplement or a multivitamin supplement that contains vitamin D.  Being in the sun. Your body naturally makes vitamin D when your skin is exposed to sunlight. Your body changes the sunlight into a form of the vitamin that the body can use.  If vitamin D deficiency is severe, it can cause a condition in which your bones become soft. In adults, this condition is called osteomalacia. In children, this condition is called rickets. What are the causes? Vitamin D deficiency may be caused by:  Not eating enough foods that contain vitamin D.  Not getting enough sun exposure.  Having certain digestive system diseases that make it difficult for your body to absorb vitamin D. These diseases include Crohn disease, chronic pancreatitis, and cystic fibrosis.  Having a surgery in which a part of the stomach or a part of the small intestine is removed.  Being obese.  Having chronic kidney disease or liver disease.  What increases the risk? This condition is more likely to develop in:  Older people.  People who do not spend much time outdoors.  People who live in a long-term care facility.  People who have had broken bones.  People with weak or thin bones (osteoporosis).  People who have a disease or condition that changes how the body absorbs vitamin D.  People who have dark skin.  People who take certain medicines, such as steroid medicines or certain seizure medicines.  People who are overweight or obese.  What are the signs or symptoms? In mild cases of vitamin D  deficiency, there may not be any symptoms. If the condition is severe, symptoms may include:  Bone pain.  Muscle pain.  Falling often.  Broken bones caused by a minor injury.  How is this diagnosed? This condition is usually diagnosed with a blood test. How is this treated? Treatment for this condition may depend on what caused the condition. Treatment options include:  Taking vitamin D supplements.  Taking a calcium supplement. Your health care provider will suggest what dose is best for you.  Follow these instructions at home:  Take medicines and supplements only as told by your health care provider.  Eat foods that contain vitamin D. Choices include: ? Fortified dairy products, cereals, or juices. Fortified means that vitamin D has been added to the food. Check the label on the package to be sure. ? Fatty fish, such as  salmon or trout. ? Eggs. ? Oysters.  Do not use a tanning bed.  Maintain a healthy weight. Lose weight, if needed.  Keep all follow-up visits as told by your health care provider. This is important. Contact a health care provider if:  Your symptoms do not go away.  You feel like throwing up (nausea) or you throw up (vomit).  You have fewer bowel movements than usual or it is difficult for you to have a bowel movement (constipation). This information is not intended to replace advice given to you by your health care provider. Make sure you discuss any questions you have with your health care provider. Document Released: 01/20/2012 Document Revised: 04/10/2016 Document Reviewed: 03/15/2015 Elsevier Interactive Patient Education  2018 [employer].

## 2018-08-12 NOTE — Progress Notes (Signed)
Subjective:   Stephanie Hess is a 67 y.o. G37P2 Caucasian female here for a routine well-woman exam.  No LMP recorded. Patient is postmenopausal.    Current complaints: none PCP: Humphrey Rolls       Does need labs  Social History: Sexual: heterosexual Marital Status: divorced Living situation: with family Occupation: unknown occupation Tobacco/alcohol: smoker Illicit drugs: no history of illicit drug use  The following portions of the patient's history were reviewed and updated as appropriate: allergies, current medications, past family history, past medical history, past social history, past surgical history and problem list.  Past Medical History Past Medical History:  Diagnosis Date  . Cancer ([place])   . COPD (chronic obstructive pulmonary disease) ([place])   . Diverticula of colon   . GERD (gastroesophageal reflux disease)   . Hyperlipidemia   . Renal insufficiency    2009 or 2010    Past Surgical History Past Surgical History:  Procedure Laterality Date  . APPENDECTOMY    . CESAREAN SECTION    . ECTOPIC PREGNANCY SURGERY      Gynecologic History G2P2  No LMP recorded. Patient is postmenopausal. Contraception: abstinence Last Pap: 2018. Results were: normal Last mammogram: 01/2017. Results were: normal   Obstetric History OB History  Gravida Para Term Preterm AB Living  2 2          SAB TAB Ectopic Multiple Live Births               # Outcome Date GA Lbr Len/2nd Weight Sex Delivery Anes PTL Lv  2 Para 1989    F CS-Unspec     1 Para 1985    F CS-Unspec       Current Medications Current Outpatient Medications on File Prior to Visit  Medication Sig Dispense Refill  . aspirin EC 81 MG tablet Take 1 tablet (81 mg total) by mouth daily. 90 tablet 3  . budesonide-formoterol (SYMBICORT) 160-4.5 MCG/ACT inhaler Inhale 2 puffs into the lungs 2 (two) times daily as needed (shortness of breath).     . clorazepate (TRANXENE) 7.5 MG tablet Take 1 tablet (7.5 mg total) by mouth at  bedtime as needed for anxiety. 30 tablet 1  . ibuprofen (ADVIL,MOTRIN) 800 MG tablet Take 1 tablet (800 mg total) by mouth every 8 (eight) hours as needed. (Patient taking differently: Take 800 mg by mouth every 8 (eight) hours as needed for mild pain. ) 21 tablet 0  . omeprazole (PRILOSEC) 20 MG capsule Take 20 mg by mouth daily.    . Vitamin D, Ergocalciferol, (DRISDOL) 50000 units CAPS capsule Take 1 capsule (50,000 Units total) by mouth 2 (two) times a week. 30 capsule 2  . levofloxacin (LEVAQUIN) 750 MG tablet Take 1 tablet (750 mg total) by mouth daily. (Patient not taking: Reported on 07/28/2018) 5 tablet 0  . nicotine (NICODERM CQ - DOSED IN MG/24 HOURS) 21 mg/24hr patch Place 1 patch (21 mg total) onto the skin daily. (Patient not taking: Reported on 07/28/2018) 30 patch 1  . nitroGLYCERIN (NITROSTAT) 0.4 MG SL tablet Place 1 tablet (0.4 mg total) under the tongue every 5 (five) minutes as needed. (Patient not taking: Reported on 07/28/2018) 25 tablet 6   No current facility-administered medications on file prior to visit.     Review of Systems Patient denies any headaches, blurred vision, shortness of breath, chest pain, abdominal pain, problems with bowel movements, urination, or intercourse.  Objective:  BP 121/82   Pulse (!) 114   Ht  5\' 2"  (1.575 m)   Wt 160 lb 4.8 oz (72.7 kg)   BMI 29.32 kg/m  Physical Exam  General:  Well developed, well nourished, no acute distress. She is alert and oriented x3. Skin:  Warm and dry Neck:  Midline trachea, no thyromegaly or nodules Cardiovascular: Regular rate and rhythm, no murmur heard Lungs:  Effort normal, all lung fields clear to auscultation bilaterally Breasts:  No dominant palpable mass, retraction, or nipple discharge Abdomen:  Soft, non tender, no hepatosplenomegaly or masses Pelvic:  External genitalia is normal in appearance.  The vagina is normal in appearance. The cervix is bulbous, no CMT.  Thin prep pap is not done . Uterus  is felt to be normal size, shape, and contour.  No adnexal masses or tenderness noted. Extremities:  No swelling or varicosities noted Psych:  She has a normal mood and affect  Assessment:   Healthy well-woman exam Hyperlipidemia Vit D deficiency Smoker- in process of quitting Pre-diabetes  Plan:  Congratulated on cutting back from 1/2 ppd to 2-3 cig/day and encouraged to keep moving towards quitting.  Labs obtained and will follow up accordingly.  Getting flu vaccine at work next week as it is free there. F/U 1 year for AE, or sooner if needed Mammogram past due & ordered  Srija Southard Rockney Ghee, CNM

## 2018-08-13 LAB — HEMOGLOBIN A1C
Est. average glucose Bld gHb Est-mCnc: 128 mg/dL
HEMOGLOBIN A1C: 6.1 % — AB (ref 4.8–5.6)

## 2018-08-13 LAB — LIPID PANEL
Chol/HDL Ratio: 8.1 ratio — ABNORMAL HIGH (ref 0.0–4.4)
Cholesterol, Total: 268 mg/dL — ABNORMAL HIGH (ref 100–199)
HDL: 33 mg/dL — ABNORMAL LOW (ref >39)
Triglycerides: 554 mg/dL (ref 0–149)

## 2018-08-13 LAB — COMPREHENSIVE METABOLIC PANEL
ALBUMIN: 4.4 g/dL (ref 3.6–4.8)
ALK PHOS: 87 IU/L (ref 39–117)
ALT: 43 IU/L — ABNORMAL HIGH (ref 0–32)
AST: 37 IU/L (ref 0–40)
Albumin/Globulin Ratio: 1.3 (ref 1.2–2.2)
BUN / CREAT RATIO: 14 (ref 12–28)
BUN: 10 mg/dL (ref 8–27)
Bilirubin Total: <0.2 mg/dL (ref 0.0–1.2)
CALCIUM: 9.9 mg/dL (ref 8.7–10.3)
CO2: 19 mmol/L — ABNORMAL LOW (ref 20–29)
CREATININE: 0.69 mg/dL (ref 0.57–1.00)
Chloride: 101 mmol/L (ref 96–106)
GFR calc Af Amer: 106 mL/min/{1.73_m2} (ref >59)
GFR calc non Af Amer: 92 mL/min/{1.73_m2} (ref >59)
Globulin, Total: 3.3 g/dL (ref 1.5–4.5)
Glucose: 122 mg/dL — ABNORMAL HIGH (ref 65–99)
Potassium: 4.4 mmol/L (ref 3.5–5.2)
Sodium: 136 mmol/L (ref 134–144)
Total Protein: 7.7 g/dL (ref 6.0–8.5)

## 2018-08-13 LAB — VITAMIN D 25 HYDROXY (VIT D DEFICIENCY, FRACTURES): Vit D, 25-Hydroxy: 21.1 ng/mL — ABNORMAL LOW (ref 30.0–100.0)

## 2018-08-13 LAB — TSH: TSH: 1.9 u[IU]/mL (ref 0.450–4.500)

## 2018-08-14 ENCOUNTER — Encounter: Payer: Self-pay | Admitting: Internal Medicine

## 2018-08-14 NOTE — Progress Notes (Signed)
Scanned in overnight pulse ox done on 08/06/18.

## 2018-08-19 ENCOUNTER — Other Ambulatory Visit: Payer: Self-pay | Admitting: Adult Health

## 2018-08-19 ENCOUNTER — Other Ambulatory Visit: Payer: 59 | Admitting: Internal Medicine

## 2018-08-19 DIAGNOSIS — G4710 Hypersomnia, unspecified: Secondary | ICD-10-CM

## 2018-08-19 MED ORDER — PREDNISONE 10 MG PO TABS: ORAL_TABLET | ORAL | 0 refills | Status: DC

## 2018-08-19 MED ORDER — LEVOFLOXACIN 500 MG PO TABS: 500.0000 mg | ORAL_TABLET | Freq: Every day | ORAL | 0 refills | Status: DC

## 2018-08-27 NOTE — Procedures (Signed)
[facility] [address], [place] [address]  Sleep Specialist: Allyne Gee, MD [place] Sleep Study Interpretation  Patient Name: Stephanie Hess Patient MR RDEYCX:[id number] DOB:1951/03/01  Date of Study: August 19, 2018  Indications for study: Hypersomnia snoring  BMI: 29.2 kg/m       Respiratory Data:  Total AHI: 3.2/h with a respiratory disturbance index of 4.7/h  Total Obstructive Apneas: 8  Total Central Apneas: 2  Total Mixed Apneas: 0  Total Hypopneas: 17  If the AHI is greater than 5 per hour patient qualifies for PAP evaluation  Oximetry Data:  Oxygen Desaturation Index: 3.5/h  Lowest Desaturation: 86% patient did have continuous significant desaturations below 88% for 47 minutes  Cardiac Data:  Minimum Heart Rate: 65  Maximum Heart Rate: 111   Impression / Diagnosis:  This apnea study does not show significant obstructive sleep apnea however patient does have significant oxygen desaturation and the complete overnight oximetry would be considered.  Clinical correlation is recommended.  Patient did have some evidence of increased upper airways resistance.  GENERAL Recommendations:  1.  Consider Auto PAP with pressure ranges 5-20 cmH20 with download, or facility based PAP Titration Study  2.  Consider PAP interface mask fitted for patient comfort, Heated Humidification & PAP compliance monitoring (1 month, 3 months & 12 months after PAP initiation)  3. Consider treatment with mandibular advancement splint (MAS) or referral to an ENT surgeon for modification to the upper airway if the patient prefers an alternate therapy or the PAP trial is unsuccessful  4. Sleep hygiene measures should be discussed with the patient  5. Behavioral therapy such as weight reduction or smoking cessation as appropriate for the patient  6. Advise patient against the use of alcohol or sedatives in so much as these substances can worsen  excessive daytime sleepiness and respiratory disturbances of sleep  7. Advise patient against participating in potentially dangerous activities while drowsy such as operating a motor vehicle, heavy equipment or power tools as it can put them and others in danger  8. Advise patient of the long term consequences of OSA if left untreated, need for treatment and close follow up  9. Clinical follow up as deemed necessary     This Level III home sleep study was performed using the [employer], a 4 channel screening device subject to limitations. Depending on actual total sleep time, not measured in this study, the AHI (sum of apneas and hypopneas/hr of sleep) and therefore the severity of sleep apnea may be underestimated. As with any single night study, including Level 1 attended PSG, severity of sleep apnea may also be underestimated due to the lack of supine and/or REM sleep.  The interpretation associated with this report is based on normal values and degrees of severity in accordance with AASM parameters and/or estimated from multiple sources in the literature for adults ages 2-80+. These may not agree with the displayed values. The patient's treating physician should use the interpretation and recommendations in conjunction with the overall clinical evaluation and treatment of the patient.  Some of the terminology used in this scored [facility] report was developed several years ago and may not always be in accordance with current nomenclature. This in no way affects the accuracy of the data or the reliability of the interpretation and recommendations.

## 2018-09-10 ENCOUNTER — Telehealth: Payer: Self-pay

## 2018-09-10 ENCOUNTER — Encounter: Payer: Self-pay | Admitting: Internal Medicine

## 2018-09-10 NOTE — Telephone Encounter (Signed)
Placed order for overnight o2. Faxed on 09-10-18 and placed in [facility] box.

## 2018-10-15 ENCOUNTER — Ambulatory Visit: Payer: 59 | Admitting: Adult Health

## 2018-10-15 ENCOUNTER — Encounter: Payer: Self-pay | Admitting: Adult Health

## 2018-10-15 VITALS — BP 142/86 | HR 105 | Temp 97.9°F | Resp 16 | Ht 62.0 in | Wt 165.0 lb

## 2018-10-15 DIAGNOSIS — J069 Acute upper respiratory infection, unspecified: Secondary | ICD-10-CM | POA: Diagnosis not present

## 2018-10-15 DIAGNOSIS — R030 Elevated blood-pressure reading, without diagnosis of hypertension: Secondary | ICD-10-CM | POA: Diagnosis not present

## 2018-10-15 DIAGNOSIS — M542 Cervicalgia: Secondary | ICD-10-CM

## 2018-10-15 DIAGNOSIS — F17200 Nicotine dependence, unspecified, uncomplicated: Secondary | ICD-10-CM

## 2018-10-15 DIAGNOSIS — J4520 Mild intermittent asthma, uncomplicated: Secondary | ICD-10-CM

## 2018-10-15 DIAGNOSIS — R000 Tachycardia, unspecified: Secondary | ICD-10-CM

## 2018-10-15 MED ORDER — IBUPROFEN 800 MG PO TABS: 800.0000 mg | ORAL_TABLET | Freq: Three times a day (TID) | ORAL | 0 refills | Status: DC | PRN

## 2018-10-15 MED ORDER — LEVOFLOXACIN 500 MG PO TABS: 500.0000 mg | ORAL_TABLET | Freq: Every day | ORAL | 0 refills | Status: DC

## 2018-10-15 NOTE — Progress Notes (Signed)
[facility] [place], [place] [address]  Internal MEDICINE  Office Visit Note  Patient Name: Stephanie Hess  [id number]  [id number]  Date of Service: 10/15/2018  Chief Complaint  Patient presents with  . Sinusitis  . Headache  . Ear Pain  . Neck Pain     HPI Pt is here for a sick visit.  Patient reports sinus pain and pressure along with headache and intermittent ear pain.  She states is been going on for a few weeks.  She has a significant pulmonary history and now has developed more of a productive cough.  She does report some neck pain also.     Current Medication:  Outpatient Encounter Medications as of 10/15/2018  Medication Sig  . aspirin EC 81 MG tablet Take 1 tablet (81 mg total) by mouth daily.  . budesonide-formoterol (SYMBICORT) 160-4.5 MCG/ACT inhaler Inhale 2 puffs into the lungs 2 (two) times daily as needed (shortness of breath).   . clorazepate (TRANXENE) 7.5 MG tablet Take 1 tablet (7.5 mg total) by mouth at bedtime as needed for anxiety.  Marland Kitchen ibuprofen (ADVIL,MOTRIN) 800 MG tablet Take 1 tablet (800 mg total) by mouth every 8 (eight) hours as needed. (Patient taking differently: Take 800 mg by mouth every 8 (eight) hours as needed for mild pain. )  . levofloxacin (LEVAQUIN) 500 MG tablet Take 1 tablet (500 mg total) by mouth daily.  Marland Kitchen omeprazole (PRILOSEC) 20 MG capsule Take 20 mg by mouth daily.  . predniSONE (DELTASONE) 10 MG tablet Use per dose pack  . Vitamin D, Ergocalciferol, (DRISDOL) 50000 units CAPS capsule Take 1 capsule (50,000 Units total) by mouth 2 (two) times a week.  Marland Kitchen ibuprofen (ADVIL,MOTRIN) 800 MG tablet Take 1 tablet (800 mg total) by mouth every 8 (eight) hours as needed.  Marland Kitchen levofloxacin (LEVAQUIN) 500 MG tablet Take 1 tablet (500 mg total) by mouth daily.  . nitroGLYCERIN (NITROSTAT) 0.4 MG SL tablet Place 1 tablet (0.4 mg total) under the tongue every 5 (five) minutes as needed. (Patient not taking: Reported on  10/15/2018)  . [DISCONTINUED] levofloxacin (LEVAQUIN) 750 MG tablet Take 1 tablet (750 mg total) by mouth daily. (Patient not taking: Reported on 07/28/2018)  . [DISCONTINUED] nicotine (NICODERM CQ - DOSED IN MG/24 HOURS) 21 mg/24hr patch Place 1 patch (21 mg total) onto the skin daily. (Patient not taking: Reported on 07/28/2018)   No facility-administered encounter medications on file as of 10/15/2018.       Medical History: Past Medical History:  Diagnosis Date  . Cancer ([place])   . COPD (chronic obstructive pulmonary disease) ([place])   . Diverticula of colon   . GERD (gastroesophageal reflux disease)   . Hyperlipidemia   . Renal insufficiency    2009 or 2010     Vital Signs: BP (!) 142/86 (BP Location: Left Arm, Patient Position: Sitting, Cuff Size: Normal)   Pulse (!) 105   Temp 97.9 F (36.6 C) (Oral)   Resp 16   Ht 5\' 2"  (1.575 m)   Wt 165 lb (74.8 kg)   SpO2 96%   BMI 30.18 kg/m    Review of Systems  Constitutional: Negative for chills, fatigue and unexpected weight change.  HENT: Negative for congestion, rhinorrhea, sneezing and sore throat.   Eyes: Negative for photophobia, pain and redness.  Respiratory: Negative for cough, chest tightness and shortness of breath.   Cardiovascular: Negative for chest pain and palpitations.  Gastrointestinal: Negative for abdominal pain, constipation, diarrhea,  nausea and vomiting.  Endocrine: Negative.   Genitourinary: Negative for dysuria and frequency.  Musculoskeletal: Negative for arthralgias, back pain, joint swelling and neck pain.  Skin: Negative for rash.  Allergic/Immunologic: Negative.   Neurological: Negative for tremors and numbness.  Hematological: Negative for adenopathy. Does not bruise/bleed easily.  Psychiatric/Behavioral: Negative for behavioral problems and sleep disturbance. The patient is not nervous/anxious.     Physical Exam  Constitutional: She is oriented to person, place, and time. She appears  well-developed and well-nourished. No distress.  HENT:  Head: Normocephalic and atraumatic.  Mouth/Throat: Oropharynx is clear and moist. No oropharyngeal exudate.  Eyes: Pupils are equal, round, and reactive to light. EOM are normal.  Neck: Normal range of motion. Neck supple. No JVD present. No tracheal deviation present. No thyromegaly present.  Cardiovascular: Normal rate, regular rhythm and normal heart sounds. Exam reveals no gallop and no friction rub.  No murmur heard. Pulmonary/Chest: Effort normal and breath sounds normal. No respiratory distress. She has no wheezes. She has no rales. She exhibits no tenderness.  Course breath sounds noted throughout  Abdominal: Soft. There is no tenderness. There is no guarding.  Musculoskeletal: Normal range of motion.  Lymphadenopathy:    She has no cervical adenopathy.  Neurological: She is alert and oriented to person, place, and time. No cranial nerve deficit.  Skin: Skin is warm and dry. She is not diaphoretic.  Psychiatric: She has a normal mood and affect. Her behavior is normal. Judgment and thought content normal.  Nursing note and vitals reviewed.  Assessment/Plan: 1. Acute upper respiratory infection Patient will be given course of Levaquin for respiratory infection.  She declined steroid taper at this time.  I instructed the patient if she is not better by the time she finished the antibiotics or she develops a fever while on antibiotics she should return to clinic or the emergency department. - levofloxacin (LEVAQUIN) 500 MG tablet; Take 1 tablet (500 mg total) by mouth daily.  Dispense: 10 tablet; Refill: 0  2. Mild intermittent asthma without complication Patient reports her breathing is stable she is using her inhalers and nebulizer as prescribed.  3. Nicotine dependence with current use Unfortunately patient continues to smoke however she is cutting down quite a bit and is working on complete cessation. Smoking cessation  counseling: 1. Pt acknowledges the risks of long term smoking, she will try to quite smoking. 2. Options for different medications including nicotine products, chewing gum, patch etc, Wellbutrin and Chantix is discussed 3. Goal and date of compete cessation is discussed 4. Total time spent in smoking cessation is 15 min.  4. Elevated blood pressure reading Patient's blood pressure is elevated at this visit 142/86 likely from illness.  We will continue to monitor in the future.  5. Tachycardia Patient is tachycardic at 105 bpm from illness, will continue to monitor at future visits.  6. Neck pain Patient given nonsteroidal anti-inflammatory medication for neck pain.  I instructed patient that if using heat for 20 minutes on and 2 hours off does not help with the pain combination with Motrin she should return to clinic for x-rays. - ibuprofen (ADVIL,MOTRIN) 800 MG tablet; Take 1 tablet (800 mg total) by mouth every 8 (eight) hours as needed.  Dispense: 30 tablet; Refill: 0  General Counseling: Hazelle verbalizes understanding of the findings of todays visit and agrees with plan of treatment. I have discussed any further diagnostic evaluation that may be needed or ordered today. We also reviewed her medications  today. she has been encouraged to call the office with any questions or concerns that should arise related to todays visit.   No orders of the defined types were placed in this encounter.   Meds ordered this encounter  Medications  . levofloxacin (LEVAQUIN) 500 MG tablet    Sig: Take 1 tablet (500 mg total) by mouth daily.    Dispense:  10 tablet    Refill:  0  . ibuprofen (ADVIL,MOTRIN) 800 MG tablet    Sig: Take 1 tablet (800 mg total) by mouth every 8 (eight) hours as needed.    Dispense:  30 tablet    Refill:  0    Time spent: 30 Minutes  This patient was seen by Orson Gear AGNP-C in Collaboration with Dr Lavera Guise as a part of collaborative care agreement.  Kendell Bane AGNP-C Internal Medicine

## 2018-10-15 NOTE — Patient Instructions (Signed)
Upper Respiratory Infection, Adult Most upper respiratory infections (URIs) are caused by a virus. A URI affects the nose, throat, and upper air passages. The most common type of URI is often called "the common cold." Follow these instructions at home:  Take medicines only as told by your doctor.  Gargle warm saltwater or take cough drops to comfort your throat as told by your doctor.  Use a warm mist humidifier or inhale steam from a shower to increase air moisture. This may make it easier to breathe.  Drink enough fluid to keep your pee (urine) clear or pale yellow.  Eat soups and other clear broths.  Have a healthy diet.  Rest as needed.  Go back to work when your fever is gone or your doctor says it is okay. ? You may need to stay home longer to avoid giving your URI to others. ? You can also wear a face mask and wash your hands often to prevent spread of the virus.  Use your inhaler more if you have asthma.  Do not use any tobacco products, including cigarettes, chewing tobacco, or electronic cigarettes. If you need help quitting, ask your doctor. Contact a doctor if:  You are getting worse, not better.  Your symptoms are not helped by medicine.  You have chills.  You are getting more short of breath.  You have brown or red mucus.  You have yellow or brown discharge from your nose.  You have pain in your face, especially when you bend forward.  You have a fever.  You have puffy (swollen) neck glands.  You have pain while swallowing.  You have white areas in the back of your throat. Get help right away if:  You have very bad or constant: ? Headache. ? Ear pain. ? Pain in your forehead, behind your eyes, and over your cheekbones (sinus pain). ? Chest pain.  You have long-lasting (chronic) lung disease and any of the following: ? Wheezing. ? Long-lasting cough. ? Coughing up blood. ? A change in your usual mucus.  You have a stiff neck.  You have  changes in your: ? Vision. ? Hearing. ? Thinking. ? Mood. This information is not intended to replace advice given to you by your health care provider. Make sure you discuss any questions you have with your health care provider. Document Released: 04/15/2008 Document Revised: 06/30/2016 Document Reviewed: 02/02/2014 Elsevier Interactive Patient Education  2018 Elsevier Inc.  

## 2018-10-21 ENCOUNTER — Other Ambulatory Visit: Payer: Self-pay | Admitting: Adult Health

## 2018-10-21 MED ORDER — PREDNISONE 10 MG PO TABS: ORAL_TABLET | ORAL | 0 refills | Status: DC

## 2018-10-21 NOTE — Progress Notes (Signed)
Called in RX for prednisone taper.

## 2018-11-17 ENCOUNTER — Ambulatory Visit: Payer: 59 | Admitting: Internal Medicine

## 2018-11-17 ENCOUNTER — Encounter: Payer: Self-pay | Admitting: Internal Medicine

## 2018-11-17 VITALS — BP 140/80 | HR 94 | Resp 16 | Ht 62.0 in | Wt 162.0 lb

## 2018-11-17 DIAGNOSIS — J470 Bronchiectasis with acute lower respiratory infection: Secondary | ICD-10-CM | POA: Diagnosis not present

## 2018-11-17 DIAGNOSIS — F17210 Nicotine dependence, cigarettes, uncomplicated: Secondary | ICD-10-CM

## 2018-11-17 DIAGNOSIS — J4520 Mild intermittent asthma, uncomplicated: Secondary | ICD-10-CM | POA: Diagnosis not present

## 2018-11-17 DIAGNOSIS — R0602 Shortness of breath: Secondary | ICD-10-CM | POA: Diagnosis not present

## 2018-11-17 NOTE — Patient Instructions (Signed)

## 2018-11-17 NOTE — Progress Notes (Signed)
[facility] [place], [place] [address]  Pulmonary Sleep Medicine   Office Visit Note  Patient Name: Stephanie Hess DOB: 02-21-1951 MRN [id number]  Date of Service: 11/17/2018  Complaints/HPI: She is here for follow-up.  She has a history of chronic obstructive asthma also has a history of ongoing tobacco use.  She states that she is still smoking states she has tried to cut down.  She finds that when she is not busy or occupied she tends to smoke.  While she is at work she does not smoke.  She has no cough no congestion is noted at this time.  She denies having any fevers or chills.  Denies having any chest pain.  Spirometry today looked okay  ROS  General: (-) fever, (-) chills, (-) night sweats, (-) weakness Skin: (-) rashes, (-) itching,. Eyes: (-) visual changes, (-) redness, (-) itching. Nose and Sinuses: (-) nasal stuffiness or itchiness, (-) postnasal drip, (-) nosebleeds, (-) sinus trouble. Mouth and Throat: (-) sore throat, (-) hoarseness. Neck: (-) swollen glands, (-) enlarged thyroid, (-) neck pain. Respiratory: + cough, (-) bloody sputum, + shortness of breath, - wheezing. Cardiovascular: - ankle swelling, (-) chest pain. Lymphatic: (-) lymph node enlargement. Neurologic: (-) numbness, (-) tingling. Psychiatric: (-) anxiety, (-) depression   Current Medication: Outpatient Encounter Medications as of 11/17/2018  Medication Sig  . aspirin EC 81 MG tablet Take 1 tablet (81 mg total) by mouth daily.  . budesonide-formoterol (SYMBICORT) 160-4.5 MCG/ACT inhaler Inhale 2 puffs into the lungs 2 (two) times daily as needed (shortness of breath).   . clorazepate (TRANXENE) 7.5 MG tablet Take 1 tablet (7.5 mg total) by mouth at bedtime as needed for anxiety.  Marland Kitchen ibuprofen (ADVIL,MOTRIN) 800 MG tablet Take 1 tablet (800 mg total) by mouth every 8 (eight) hours as needed. (Patient taking differently: Take 800 mg by mouth every 8 (eight) hours as needed for  mild pain. )  . ibuprofen (ADVIL,MOTRIN) 800 MG tablet Take 1 tablet (800 mg total) by mouth every 8 (eight) hours as needed.  Marland Kitchen levofloxacin (LEVAQUIN) 500 MG tablet Take 1 tablet (500 mg total) by mouth daily.  Marland Kitchen levofloxacin (LEVAQUIN) 500 MG tablet Take 1 tablet (500 mg total) by mouth daily.  . nitroGLYCERIN (NITROSTAT) 0.4 MG SL tablet Place 1 tablet (0.4 mg total) under the tongue every 5 (five) minutes as needed.  Marland Kitchen omeprazole (PRILOSEC) 20 MG capsule Take 20 mg by mouth daily.  . OXYGEN Inhale into the lungs. 2 litre at night  . predniSONE (DELTASONE) 10 MG tablet Use per dose pack  . predniSONE (DELTASONE) 10 MG tablet Use per dose pack  . Vitamin D, Ergocalciferol, (DRISDOL) 50000 units CAPS capsule Take 1 capsule (50,000 Units total) by mouth 2 (two) times a week.   No facility-administered encounter medications on file as of 11/17/2018.     Surgical History: Past Surgical History:  Procedure Laterality Date  . APPENDECTOMY    . CESAREAN SECTION    . ECTOPIC PREGNANCY SURGERY      Medical History: Past Medical History:  Diagnosis Date  . Cancer ([place])   . COPD (chronic obstructive pulmonary disease) ([place])   . Diverticula of colon   . GERD (gastroesophageal reflux disease)   . Hyperlipidemia   . Renal insufficiency    2009 or 2010    Family History: Family History  Problem Relation Age of Onset  . Breast cancer Paternal Aunt   . Heart attack Mother  Social History: Social History   Socioeconomic History  . Marital status: Divorced    Spouse name: Not on file  . Number of children: Not on file  . Years of education: Not on file  . Highest education level: Not on file  Occupational History  . Not on file  Social Needs  . Financial resource strain: Not on file  . Food insecurity:    Worry: Not on file    Inability: Not on file  . Transportation needs:    Medical: Not on file    Non-medical: Not on file  Tobacco Use  . Smoking status: Current Every  Day Smoker    Packs/day: 0.50    Years: 50.00    Pack years: 25.00    Types: Cigarettes  . Smokeless tobacco: Never Used  Substance and Sexual Activity  . Alcohol use: Yes    Comment: social  . Drug use: No  . Sexual activity: Never  Lifestyle  . Physical activity:    Days per week: Not on file    Minutes per session: Not on file  . Stress: Not on file  Relationships  . Social connections:    Talks on phone: Not on file    Gets together: Not on file    Attends religious service: Not on file    Active member of club or organization: Not on file    Attends meetings of clubs or organizations: Not on file    Relationship status: Not on file  . Intimate partner violence:    Fear of current or ex partner: Not on file    Emotionally abused: Not on file    Physically abused: Not on file    Forced sexual activity: Not on file  Other Topics Concern  . Not on file  Social History Narrative  . Not on file    Vital Signs: Blood pressure 140/80, pulse 94, resp. rate 16, height 5\' 2"  (1.575 m), weight 162 lb (73.5 kg), SpO2 95 %.  Examination: General Appearance: The patient is well-developed, well-nourished, and in no distress. Skin: Gross inspection of skin unremarkable. Head: normocephalic, no gross deformities. Eyes: no gross deformities noted. ENT: ears appear grossly normal no exudates. Neck: Supple. No thyromegaly. No LAD. Respiratory: no rhonchi noted at this time. Cardiovascular: Normal S1 and S2 without murmur or rub. Extremities: No cyanosis. pulses are equal. Neurologic: Alert and oriented. No involuntary movements.  LABS: No results found for this or any previous visit (from the past 2160 hour(s)).  Radiology: Mm 3d Screen Breast Bilateral  Result Date: 08/13/2018 CLINICAL DATA:  Screening. EXAM: DIGITAL SCREENING BILATERAL MAMMOGRAM WITH TOMO AND CAD COMPARISON:  Previous exam(s). ACR Breast Density Category b: There are scattered areas of fibroglandular  density. FINDINGS: There are no findings suspicious for malignancy. Images were processed with CAD. IMPRESSION: No mammographic evidence of malignancy. A result letter of this screening mammogram will be mailed directly to the patient. RECOMMENDATION: Screening mammogram in one year. (Code:SM-B-01Y) BI-RADS CATEGORY  1: Negative. Electronically Signed   By: Lovey Newcomer M.D.   On: 08/13/2018 09:06    No results found.  No results found.    Assessment and Plan: Patient Active Problem List   Diagnosis Date Noted  . Respiratory distress 07/16/2018  . Bronchiectasis with acute lower respiratory infection ([place]) 03/03/2018  . Nicotine abuse 03/03/2018  . Mild intermittent asthma without complication 43/15/4008  . Pulmonary nodule 03/03/2018  . Gastroesophageal reflux disease without esophagitis 03/03/2018  . Hyperlipidemia  05/15/2017  . Vitamin D deficiency 05/15/2017    1. Chronic Asthma/COPD she will continue with Symbicort seems to help her.  Discussed the use of the inhalers. 2. Bronchiectasis no active infection at this time.  She needs to watch out for increasing her sputum production.  She did have a little bit of a cough that she states that her phlegm is clear 3. Smoker smoking cessation was discussed with her again also spoke to her daughter about smoking cessation regarding the patient's overall health 4. Nocturnal hypoxia she is currently on oxygen at nighttime which needs to be continued.  I explained to her that she continues to smoke she may end up having to use oxygen during the daytime also.  General Counseling: I have discussed the findings of the evaluation and examination with Lindie.  I have also discussed any further diagnostic evaluation thatmay be needed or ordered today. Chandy verbalizes understanding of the findings of todays visit. We also reviewed her medications today and discussed drug interactions and side effects including but not limited excessive drowsiness and  altered mental states. We also discussed that there is always a risk not just to her but also people around her. she has been encouraged to call the office with any questions or concerns that should arise related to todays visit.    Time spent: 62min  I have personally obtained a history, examined the patient, evaluated laboratory and imaging results, formulated the assessment and plan and placed orders.    Allyne Gee, MD [facility] Pulmonary and Critical Care Sleep medicine

## 2018-11-20 ENCOUNTER — Encounter: Payer: Self-pay | Admitting: Nurse Practitioner

## 2018-11-20 ENCOUNTER — Ambulatory Visit: Payer: 59 | Admitting: Nurse Practitioner

## 2018-11-20 VITALS — BP 129/80 | HR 91 | Resp 16 | Ht 62.0 in | Wt 162.0 lb

## 2018-11-20 DIAGNOSIS — N390 Urinary tract infection, site not specified: Secondary | ICD-10-CM

## 2018-11-20 DIAGNOSIS — R300 Dysuria: Secondary | ICD-10-CM

## 2018-11-20 LAB — POCT URINALYSIS DIPSTICK
BILIRUBIN UA: NEGATIVE
Glucose, UA: NEGATIVE
KETONES UA: NEGATIVE
Nitrite, UA: NEGATIVE
PH UA: 5 (ref 5.0–8.0)
Protein, UA: POSITIVE — AB
RBC UA: NEGATIVE
SPEC GRAV UA: 1.01 (ref 1.010–1.025)
UROBILINOGEN UA: 0.2 U/dL

## 2018-11-20 MED ORDER — CIPROFLOXACIN HCL 500 MG PO TABS: 500.0000 mg | ORAL_TABLET | Freq: Two times a day (BID) | ORAL | 0 refills | Status: DC

## 2018-11-20 NOTE — Progress Notes (Signed)
[facility] [place], [place] [address]  Internal MEDICINE  Office Visit Note  Patient Name: Stephanie Hess  [id number]  [id number]  Date of Service: 11/25/2018   Pt is here for a sick visit.   Chief Complaint  Patient presents with  . Urinary Tract Infection    pt has a lot of pressure, no burning,no itching, not urinating alot at times its just a sprinkle. had pain since sunday monday took azo felt better until last night. pt is currently not taking the AZO     Urinary Tract Infection   This is a new problem. The current episode started yesterday. The problem occurs every urination. The problem has been gradually worsening. The quality of the pain is described as burning and stabbing. The pain is at a severity of 6/10. The pain is moderate. There has been no fever. She is not sexually active. There is no history of pyelonephritis. Associated symptoms include chills, flank pain, frequency, hematuria, nausea and urgency. Pertinent negatives include no vomiting. She has tried acetaminophen for the symptoms. The treatment provided no relief. Her past medical history is significant for recurrent UTIs.       Current Medication:  Outpatient Encounter Medications as of 11/20/2018  Medication Sig  . aspirin EC 81 MG tablet Take 1 tablet (81 mg total) by mouth daily.  . budesonide-formoterol (SYMBICORT) 160-4.5 MCG/ACT inhaler Inhale 2 puffs into the lungs 2 (two) times daily as needed (shortness of breath).   . ciprofloxacin (CIPRO) 500 MG tablet Take 1 tablet (500 mg total) by mouth 2 (two) times daily.  . clorazepate (TRANXENE) 7.5 MG tablet Take 1 tablet (7.5 mg total) by mouth at bedtime as needed for anxiety.  Marland Kitchen ibuprofen (ADVIL,MOTRIN) 800 MG tablet Take 1 tablet (800 mg total) by mouth every 8 (eight) hours as needed. (Patient taking differently: Take 800 mg by mouth every 8 (eight) hours as needed for mild pain. )  . ibuprofen (ADVIL,MOTRIN) 800 MG  tablet Take 1 tablet (800 mg total) by mouth every 8 (eight) hours as needed.  Marland Kitchen levofloxacin (LEVAQUIN) 500 MG tablet Take 1 tablet (500 mg total) by mouth daily.  Marland Kitchen levofloxacin (LEVAQUIN) 500 MG tablet Take 1 tablet (500 mg total) by mouth daily.  . nitroGLYCERIN (NITROSTAT) 0.4 MG SL tablet Place 1 tablet (0.4 mg total) under the tongue every 5 (five) minutes as needed.  Marland Kitchen omeprazole (PRILOSEC) 20 MG capsule Take 20 mg by mouth daily.  . OXYGEN Inhale into the lungs. 2 litre at night  . predniSONE (DELTASONE) 10 MG tablet Use per dose pack  . predniSONE (DELTASONE) 10 MG tablet Use per dose pack  . Vitamin D, Ergocalciferol, (DRISDOL) 50000 units CAPS capsule Take 1 capsule (50,000 Units total) by mouth 2 (two) times a week.   No facility-administered encounter medications on file as of 11/20/2018.       Medical History: Past Medical History:  Diagnosis Date  . Cancer ([place])   . COPD (chronic obstructive pulmonary disease) ([place])   . Diverticula of colon   . GERD (gastroesophageal reflux disease)   . Hyperlipidemia   . Renal insufficiency    2009 or 2010     Today's Vitals   11/20/18 1536  BP: 129/80  Pulse: 91  Resp: 16  SpO2: 95%  Weight: 162 lb (73.5 kg)  Height: 5\' 2"  (1.575 m)    Review of Systems  Constitutional: Positive for chills and fatigue. Negative for fever and unexpected  weight change.  HENT: Negative for congestion, postnasal drip, rhinorrhea, sneezing and sore throat.   Respiratory: Negative for cough, chest tightness and shortness of breath.   Cardiovascular: Negative for chest pain and palpitations.  Gastrointestinal: Positive for nausea. Negative for abdominal pain, constipation, diarrhea and vomiting.  Genitourinary: Positive for flank pain, frequency, hematuria and urgency. Negative for dysuria.  Musculoskeletal: Positive for back pain and myalgias. Negative for arthralgias, joint swelling and neck pain.  Skin: Negative for rash.   Allergic/Immunologic: Negative for environmental allergies.  Neurological: Positive for headaches. Negative for tremors and numbness.  Hematological: Negative for adenopathy. Does not bruise/bleed easily.  Psychiatric/Behavioral: Negative for behavioral problems (Depression), sleep disturbance and suicidal ideas. The patient is not nervous/anxious.     Physical Exam Vitals signs and nursing note reviewed.  Constitutional:      General: She is not in acute distress.    Appearance: She is well-developed. She is ill-appearing. She is not diaphoretic.  HENT:     Head: Normocephalic and atraumatic.     Mouth/Throat:     Pharynx: No oropharyngeal exudate.  Eyes:     Pupils: Pupils are equal, round, and reactive to light.  Neck:     Musculoskeletal: Normal range of motion and neck supple.     Thyroid: No thyromegaly.     Vascular: No JVD.     Trachea: No tracheal deviation.  Cardiovascular:     Rate and Rhythm: Normal rate and regular rhythm.     Heart sounds: Normal heart sounds. No murmur. No friction rub. No gallop.   Pulmonary:     Effort: Pulmonary effort is normal. No respiratory distress.     Breath sounds: Normal breath sounds. No wheezing or rales.  Chest:     Chest wall: No tenderness.  Abdominal:     General: Bowel sounds are normal.     Palpations: Abdomen is soft.  Genitourinary:    Comments: U/a positive for protein and small wbc. Bilateral flank pain present.  Musculoskeletal: Normal range of motion.  Lymphadenopathy:     Cervical: No cervical adenopathy.  Skin:    General: Skin is warm and dry.  Neurological:     Mental Status: She is alert and oriented to person, place, and time.     Cranial Nerves: No cranial nerve deficit.  Psychiatric:        Behavior: Behavior normal.        Thought Content: Thought content normal.        Judgment: Judgment normal.    Assessment/Plan: 1. Urinary tract infection without hematuria, site unspecified Start cipro 500mg   bid for 7 days. Send urine for culture and sensitivity and adjust abx as indicated.  - CULTURE, URINE COMPREHENSIVE - ciprofloxacin (CIPRO) 500 MG tablet; Take 1 tablet (500 mg total) by mouth 2 (two) times daily.  Dispense: 14 tablet; Refill: 0  2. Dysuria Recommend OTC azo to relieve bladder pain.  - POCT Urinalysis Dipstick  General Counseling: Faizah verbalizes understanding of the findings of todays visit and agrees with plan of treatment. I have discussed any further diagnostic evaluation that may be needed or ordered today. We also reviewed her medications today. she has been encouraged to call the office with any questions or concerns that should arise related to todays visit.    Counseling:  This patient was seen by Leretha Pol FNP Collaboration with Dr Lavera Guise as a part of collaborative care agreement  Orders Placed This Encounter  Procedures  .  CULTURE, URINE COMPREHENSIVE  . POCT Urinalysis Dipstick    Meds ordered this encounter  Medications  . ciprofloxacin (CIPRO) 500 MG tablet    Sig: Take 1 tablet (500 mg total) by mouth 2 (two) times daily.    Dispense:  14 tablet    Refill:  0    Order Specific Question:   Supervising Provider    Answer:   Lavera Guise [[id number]]    Time spent: 15 Minutes

## 2018-11-25 DIAGNOSIS — N390 Urinary tract infection, site not specified: Secondary | ICD-10-CM | POA: Insufficient documentation

## 2018-11-25 DIAGNOSIS — R300 Dysuria: Secondary | ICD-10-CM | POA: Insufficient documentation

## 2018-11-25 LAB — CULTURE, URINE COMPREHENSIVE

## 2018-12-07 ENCOUNTER — Other Ambulatory Visit: Payer: Self-pay | Admitting: Nurse Practitioner

## 2018-12-07 DIAGNOSIS — J069 Acute upper respiratory infection, unspecified: Secondary | ICD-10-CM

## 2018-12-07 MED ORDER — AZITHROMYCIN 250 MG PO TABS: ORAL_TABLET | ORAL | 0 refills | Status: DC

## 2018-12-07 NOTE — Progress Notes (Signed)
Sent z-pack to [facility] due to [employer]

## 2018-12-09 ENCOUNTER — Ambulatory Visit: Payer: 59 | Admitting: Internal Medicine

## 2018-12-09 DIAGNOSIS — R0602 Shortness of breath: Secondary | ICD-10-CM

## 2018-12-09 LAB — PULMONARY FUNCTION TEST

## 2018-12-10 NOTE — Procedures (Signed)
Stephanie Hess, [id number]  DATE OF SERVICE: December 09, 2018  Complete Pulmonary Function Testing Interpretation:  FINDINGS:  Forced vital capacity is normal FEV1 is within normal limits.  FEV1 FVC ratio is normal.  Total lung capacity is increased residual volume is increased residual volume total lung capacity ratio is increased DLCO is within normal limits postbronchodilator there is no significant change in the FEV1  IMPRESSION:  This pulmonary function study is within normal limits  Stephanie Gee, MD [facility] Pulmonary Critical Care Medicine Sleep Medicine

## 2018-12-14 ENCOUNTER — Ambulatory Visit
Admission: RE | Admit: 2018-12-14 | Discharge: 2018-12-14 | Disposition: A | Discharge status: Home / Self Care | Payer: 59 | Source: Ambulatory Visit | Attending: Nurse Practitioner | Admitting: Nurse Practitioner

## 2018-12-14 ENCOUNTER — Ambulatory Visit
Admission: RE | Admit: 2018-12-14 | Discharge: 2018-12-14 | Disposition: A | Discharge status: Home / Self Care | Payer: 59 | Attending: Nurse Practitioner | Admitting: Nurse Practitioner

## 2018-12-14 ENCOUNTER — Encounter: Payer: Self-pay | Admitting: Nurse Practitioner

## 2018-12-14 ENCOUNTER — Other Ambulatory Visit: Payer: Self-pay | Admitting: Obstetrics and Gynecology

## 2018-12-14 ENCOUNTER — Ambulatory Visit: Payer: 59 | Admitting: Nurse Practitioner

## 2018-12-14 VITALS — BP 126/68 | HR 97 | Temp 98.1°F | Resp 16 | Ht 62.0 in | Wt 162.0 lb

## 2018-12-14 DIAGNOSIS — J069 Acute upper respiratory infection, unspecified: Secondary | ICD-10-CM | POA: Diagnosis not present

## 2018-12-14 DIAGNOSIS — R300 Dysuria: Secondary | ICD-10-CM | POA: Diagnosis not present

## 2018-12-14 DIAGNOSIS — R042 Hemoptysis: Secondary | ICD-10-CM | POA: Diagnosis not present

## 2018-12-14 DIAGNOSIS — R509 Fever, unspecified: Secondary | ICD-10-CM | POA: Diagnosis not present

## 2018-12-14 DIAGNOSIS — J029 Acute pharyngitis, unspecified: Secondary | ICD-10-CM

## 2018-12-14 LAB — POCT URINALYSIS DIPSTICK
Bilirubin, UA: NEGATIVE
GLUCOSE UA: NEGATIVE
Ketones, UA: NEGATIVE
LEUKOCYTES UA: NEGATIVE
NITRITE UA: NEGATIVE
PH UA: 5 (ref 5.0–8.0)
PROTEIN UA: NEGATIVE
RBC UA: NEGATIVE
Spec Grav, UA: 1.02 (ref 1.010–1.025)
UROBILINOGEN UA: 0.2 U/dL

## 2018-12-14 MED ORDER — LEVOFLOXACIN 500 MG PO TABS: 500.0000 mg | ORAL_TABLET | Freq: Every day | ORAL | 0 refills | Status: DC

## 2018-12-14 NOTE — Progress Notes (Signed)
[facility] [place], [place] [address]  Internal MEDICINE  Office Visit Note  Patient Name: Stephanie Hess  [id number]  [id number]  Date of Service: 12/20/2018   Pt is here for a sick visit.  Chief Complaint  Patient presents with  . Urinary Tract Infection    still feel pressure,urgency, took 2 AZO's yesterday it helps with the pain, finished last antibiotic given for UTI it went away anc came nack a week later  . Cough    pt coughing up a little bit of blood notice a little last week,had been since for about two weeks with flu like symptoms comes and goes,     . URI    head, nose, face pain, had a fever a week ago, has chills  . Chills  . Sore Throat    feels swollen     The patient is here for sick visit. She has had cough, congestion, headache, and generalized fatigue for several days. She states that, on a few occassions, and has coughed up blood-tinged sputum. She feels like she is wheezing and has mild shortness of breath. She denies fever or chills. Has not taken any OTC medications to help these symptoms.  She has also been having frequency and urgency of urination. This is intermittent. Did take OTC AZO yesterday which has improved these symptoms. She has no nausea, vomiting, or abdominal pain.        Current Medication:  Outpatient Encounter Medications as of 12/14/2018  Medication Sig  . aspirin EC 81 MG tablet Take 1 tablet (81 mg total) by mouth daily.  . budesonide-formoterol (SYMBICORT) 160-4.5 MCG/ACT inhaler Inhale 2 puffs into the lungs 2 (two) times daily as needed (shortness of breath).   . clorazepate (TRANXENE) 7.5 MG tablet TAKE 1 TABLET BY MOUTH AT BEDTIME AS NEEDED FOR ANXIETY  . ibuprofen (ADVIL,MOTRIN) 800 MG tablet Take 1 tablet (800 mg total) by mouth every 8 (eight) hours as needed. (Patient taking differently: Take 800 mg by mouth every 8 (eight) hours as needed for mild pain. )  . ibuprofen (ADVIL,MOTRIN) 800 MG  tablet Take 1 tablet (800 mg total) by mouth every 8 (eight) hours as needed.  . nitroGLYCERIN (NITROSTAT) 0.4 MG SL tablet Place 1 tablet (0.4 mg total) under the tongue every 5 (five) minutes as needed.  Marland Kitchen omeprazole (PRILOSEC) 20 MG capsule Take 20 mg by mouth daily.  . OXYGEN Inhale into the lungs. 2 litre at night  . Vitamin D, Ergocalciferol, (DRISDOL) 50000 units CAPS capsule Take 1 capsule (50,000 Units total) by mouth 2 (two) times a week.  Marland Kitchen levofloxacin (LEVAQUIN) 500 MG tablet Take 1 tablet (500 mg total) by mouth daily.  . [DISCONTINUED] azithromycin (ZITHROMAX) 250 MG tablet z-pack - take as directed for 5 days (Patient not taking: Reported on 12/14/2018)   No facility-administered encounter medications on file as of 12/14/2018.       Medical History: Past Medical History:  Diagnosis Date  . Cancer ([place])   . COPD (chronic obstructive pulmonary disease) ([place])   . Diverticula of colon   . GERD (gastroesophageal reflux disease)   . Hyperlipidemia   . Renal insufficiency    2009 or 2010     Today's Vitals   12/14/18 1610  BP: 126/68  Pulse: 97  Resp: 16  Temp: 98.1 F (36.7 C)  SpO2: 93%  Weight: 162 lb (73.5 kg)  Height: 5\' 2"  (1.575 m)   Body mass index is  29.63 kg/m.  Review of Systems  Constitutional: Positive for chills, fatigue and fever. Negative for unexpected weight change.  HENT: Positive for ear pain, postnasal drip, rhinorrhea, sinus pain and voice change. Negative for congestion, sneezing and sore throat.   Respiratory: Positive for cough, shortness of breath and wheezing. Negative for chest tightness.   Cardiovascular: Negative for chest pain and palpitations.  Gastrointestinal: Negative for abdominal pain, constipation, diarrhea, nausea and vomiting.  Genitourinary: Positive for dysuria, frequency and urgency.  Musculoskeletal: Positive for arthralgias and myalgias. Negative for back pain, joint swelling and neck pain.  Skin: Negative.  Negative  for rash.  Neurological: Positive for headaches. Negative for tremors and numbness.  Hematological: Positive for adenopathy. Does not bruise/bleed easily.  Psychiatric/Behavioral: Behavioral problem: Depression.    Physical Exam Vitals signs and nursing note reviewed.  Constitutional:      General: She is not in acute distress.    Appearance: She is well-developed. She is ill-appearing. She is not diaphoretic.  HENT:     Head: Normocephalic and atraumatic.     Right Ear: External ear normal.     Left Ear: External ear normal.     Nose: Congestion and rhinorrhea present.     Mouth/Throat:     Pharynx: Posterior oropharyngeal erythema present. No oropharyngeal exudate.  Eyes:     Conjunctiva/sclera: Conjunctivae normal.     Pupils: Pupils are equal, round, and reactive to light.  Neck:     Musculoskeletal: Normal range of motion and neck supple.     Thyroid: No thyromegaly.     Vascular: No JVD.     Trachea: No tracheal deviation.  Cardiovascular:     Rate and Rhythm: Normal rate and regular rhythm.     Heart sounds: Normal heart sounds. No murmur. No friction rub. No gallop.   Pulmonary:     Effort: Pulmonary effort is normal. No respiratory distress.     Breath sounds: Wheezing present. No rales.     Comments: Congested, non-productive cough present.  Chest:     Chest wall: No tenderness.  Abdominal:     General: Bowel sounds are normal.     Palpations: Abdomen is soft.     Tenderness: There is no abdominal tenderness.  Genitourinary:    Comments: Urine sample negative for evidence of infection or other abnormalities.  Musculoskeletal: Normal range of motion.  Lymphadenopathy:     Cervical: Cervical adenopathy present.  Skin:    General: Skin is warm and dry.  Neurological:     Mental Status: She is alert and oriented to person, place, and time.     Cranial Nerves: No cranial nerve deficit.  Psychiatric:        Behavior: Behavior normal.        Thought Content:  Thought content normal.        Judgment: Judgment normal.   Assessment/Plan: 1. Acute upper respiratory infection Flu and strep tests both negative. Treat with levaquin 500mg  daily for 10 days. Rest and increse fluids. Recommend OTC medication to improve symptoms.  - levofloxacin (LEVAQUIN) 500 MG tablet; Take 1 tablet (500 mg total) by mouth daily.  Dispense: 10 tablet; Refill: 0  2. Sore throat Strep test negative.  - POCT rapid strep A  3. Fever and chills Flu test negative.  - POC INFLUENZA TEST  4. Hemoptysis Will get chest x-ray for further evaluation.  - DG Chest 2 View; Future  5. Dysuria U/a negative for infection or other abnormalities  -  POCT Urinalysis Dipstick  General Counseling: Emmilynn verbalizes understanding of the findings of todays visit and agrees with plan of treatment. I have discussed any further diagnostic evaluation that may be needed or ordered today. We also reviewed her medications today. she has been encouraged to call the office with any questions or concerns that should arise related to todays visit.    Counseling:  Rest and increase fluids. Continue using OTC medication to control symptoms.   This patient was seen by Leretha Pol FNP Collaboration with Dr Lavera Guise as a part of collaborative care agreement  Orders Placed This Encounter  Procedures  . DG Chest 2 View  . POCT Urinalysis Dipstick  . POCT rapid strep A  . POC INFLUENZA TEST    Meds ordered this encounter  Medications  . levofloxacin (LEVAQUIN) 500 MG tablet    Sig: Take 1 tablet (500 mg total) by mouth daily.    Dispense:  10 tablet    Refill:  0    Order Specific Question:   Supervising Provider    Answer:   Lavera Guise [[id number]]    Time spent: 25 Minutes

## 2018-12-15 ENCOUNTER — Telehealth: Payer: Self-pay

## 2018-12-16 ENCOUNTER — Telehealth: Payer: Self-pay

## 2018-12-16 NOTE — Telephone Encounter (Signed)
Pt advised for chest xray normal

## 2018-12-16 NOTE — Telephone Encounter (Signed)
Pt advised chest xray normal 

## 2018-12-20 DIAGNOSIS — R042 Hemoptysis: Secondary | ICD-10-CM | POA: Insufficient documentation

## 2018-12-20 DIAGNOSIS — R509 Fever, unspecified: Secondary | ICD-10-CM | POA: Insufficient documentation

## 2018-12-20 DIAGNOSIS — J069 Acute upper respiratory infection, unspecified: Secondary | ICD-10-CM | POA: Insufficient documentation

## 2018-12-20 DIAGNOSIS — J029 Acute pharyngitis, unspecified: Secondary | ICD-10-CM | POA: Insufficient documentation

## 2018-12-20 LAB — POCT RAPID STREP A (OFFICE): RAPID STREP A SCREEN: NEGATIVE

## 2018-12-20 LAB — POC INFLUENZA TEST: Negative: NEGATIVE

## 2019-02-12 ENCOUNTER — Encounter: Payer: Self-pay | Admitting: Adult Health

## 2019-02-12 ENCOUNTER — Other Ambulatory Visit: Payer: Self-pay

## 2019-02-12 ENCOUNTER — Ambulatory Visit: Payer: 59 | Admitting: Adult Health

## 2019-02-12 VITALS — BP 128/80 | HR 95 | Temp 97.6°F | Resp 16 | Ht 62.0 in | Wt 161.0 lb

## 2019-02-12 DIAGNOSIS — R300 Dysuria: Secondary | ICD-10-CM | POA: Diagnosis not present

## 2019-02-12 DIAGNOSIS — F17200 Nicotine dependence, unspecified, uncomplicated: Secondary | ICD-10-CM | POA: Diagnosis not present

## 2019-02-12 DIAGNOSIS — N3000 Acute cystitis without hematuria: Secondary | ICD-10-CM | POA: Diagnosis not present

## 2019-02-12 MED ORDER — LEVOFLOXACIN 500 MG PO TABS: 500.0000 mg | ORAL_TABLET | Freq: Every day | ORAL | 0 refills | Status: DC

## 2019-02-12 MED ORDER — PHENAZOPYRIDINE HCL 200 MG PO TABS: 200.0000 mg | ORAL_TABLET | Freq: Three times a day (TID) | ORAL | 0 refills | Status: DC | PRN

## 2019-02-12 NOTE — Patient Instructions (Signed)
Urinary Tract Infection, Adult A urinary tract infection (UTI) is an infection of any part of the urinary tract. The urinary tract includes:  The kidneys.  The ureters.  The bladder.  The urethra. These organs make, store, and get rid of pee (urine) in the body. What are the causes? This is caused by germs (bacteria) in your genital area. These germs grow and cause swelling (inflammation) of your urinary tract. What increases the risk? You are more likely to develop this condition if:  You have a small, thin tube (catheter) to drain pee.  You cannot control when you pee or poop (incontinence).  You are female, and: ? You use these methods to prevent pregnancy: ? A medicine that kills sperm (spermicide). ? A device that blocks sperm (diaphragm). ? You have low levels of a female hormone (estrogen). ? You are pregnant.  You have genes that add to your risk.  You are sexually active.  You take antibiotic medicines.  You have trouble peeing because of: ? A prostate that is bigger than normal, if you are female. ? A blockage in the part of your body that drains pee from the bladder (urethra). ? A kidney stone. ? A nerve condition that affects your bladder (neurogenic bladder). ? Not getting enough to drink. ? Not peeing often enough.  You have other conditions, such as: ? Diabetes. ? A weak disease-fighting system (immune system). ? Sickle cell disease. ? Gout. ? Injury of the spine. What are the signs or symptoms? Symptoms of this condition include:  Needing to pee right away (urgently).  Peeing often.  Peeing small amounts often.  Pain or burning when peeing.  Blood in the pee.  Pee that smells bad or not like normal.  Trouble peeing.  Pee that is cloudy.  Fluid coming from the vagina, if you are female.  Pain in the belly or lower back. Other symptoms include:  Throwing up (vomiting).  No urge to eat.  Feeling mixed up (confused).  Being tired  and grouchy (irritable).  A fever.  Watery poop (diarrhea). How is this treated? This condition may be treated with:  Antibiotic medicine.  Other medicines.  Drinking enough water. Follow these instructions at home:  Medicines  Take over-the-counter and prescription medicines only as told by your doctor.  If you were prescribed an antibiotic medicine, take it as told by your doctor. Do not stop taking it even if you start to feel better. General instructions  Make sure you: ? Pee until your bladder is empty. ? Do not hold pee for a long time. ? Empty your bladder after sex. ? Wipe from front to back after pooping if you are a female. Use each tissue one time when you wipe.  Drink enough fluid to keep your pee pale yellow.  Keep all follow-up visits as told by your doctor. This is important. Contact a doctor if:  You do not get better after 1-2 days.  Your symptoms go away and then come back. Get help right away if:  You have very bad back pain.  You have very bad pain in your lower belly.  You have a fever.  You are sick to your stomach (nauseous).  You are throwing up. Summary  A urinary tract infection (UTI) is an infection of any part of the urinary tract.  This condition is caused by germs in your genital area.  There are many risk factors for a UTI. These include having a small, thin   tube to drain pee and not being able to control when you pee or poop.  Treatment includes antibiotic medicines for germs.  Drink enough fluid to keep your pee pale yellow. This information is not intended to replace advice given to you by your health care provider. Make sure you discuss any questions you have with your health care provider. Document Released: 04/15/2008 Document Revised: 05/07/2018 Document Reviewed: 05/07/2018 Elsevier Interactive Patient Education  2019 [employer]  

## 2019-02-12 NOTE — Progress Notes (Signed)
[facility] [place], [place] [address]  Internal MEDICINE  Office Visit Note  Patient Name: Stephanie Hess  [id number]  [id number]  Date of Service: 03/23/2019  Chief Complaint  Patient presents with  . Urinary Tract Infection    alot of pressure vaginal area , no pain while urination been using Azo , no relief      HPI Pt is here for a sick visit. Pt reports one week of dysuria, pressure.  She reports vaginal pain.  She also reports frequency.  She has been taking AZO with some relief.    Current Medication:  Outpatient Encounter Medications as of 02/12/2019  Medication Sig  . aspirin EC 81 MG tablet Take 1 tablet (81 mg total) by mouth daily.  . budesonide-formoterol (SYMBICORT) 160-4.5 MCG/ACT inhaler Inhale 2 puffs into the lungs 2 (two) times daily as needed (shortness of breath).   . ELDERBERRY PO Take by mouth.  Marland Kitchen ibuprofen (ADVIL,MOTRIN) 800 MG tablet Take 1 tablet (800 mg total) by mouth every 8 (eight) hours as needed. (Patient taking differently: Take 800 mg by mouth every 8 (eight) hours as needed for mild pain. )  . ibuprofen (ADVIL,MOTRIN) 800 MG tablet Take 1 tablet (800 mg total) by mouth every 8 (eight) hours as needed.  Marland Kitchen omeprazole (PRILOSEC) 20 MG capsule Take 20 mg by mouth daily.  . OXYGEN Inhale into the lungs. 2 litre at night  . Vitamin D, Ergocalciferol, (DRISDOL) 50000 units CAPS capsule Take 1 capsule (50,000 Units total) by mouth 2 (two) times a week.  . [DISCONTINUED] clorazepate (TRANXENE) 7.5 MG tablet TAKE 1 TABLET BY MOUTH AT BEDTIME AS NEEDED FOR ANXIETY  . levofloxacin (LEVAQUIN) 500 MG tablet Take 1 tablet (500 mg total) by mouth daily.  . nitroGLYCERIN (NITROSTAT) 0.4 MG SL tablet Place 1 tablet (0.4 mg total) under the tongue every 5 (five) minutes as needed. (Patient not taking: Reported on 02/12/2019)  . phenazopyridine (PYRIDIUM) 200 MG tablet Take 1 tablet (200 mg total) by mouth 3 (three) times daily as needed for  pain.  . [DISCONTINUED] levofloxacin (LEVAQUIN) 500 MG tablet Take 1 tablet (500 mg total) by mouth daily. (Patient not taking: Reported on 02/12/2019)   No facility-administered encounter medications on file as of 02/12/2019.       Medical History: Past Medical History:  Diagnosis Date  . Cancer ([place])   . COPD (chronic obstructive pulmonary disease) ([place])   . Diverticula of colon   . GERD (gastroesophageal reflux disease)   . Hyperlipidemia   . Renal insufficiency    2009 or 2010     Vital Signs: BP 128/80   Pulse 95   Temp 97.6 F (36.4 C)   Resp 16   Ht 5\' 2"  (1.575 m)   Wt 161 lb (73 kg)   SpO2 96%   BMI 29.45 kg/m    Review of Systems  Constitutional: Negative for chills, fatigue and unexpected weight change.  HENT: Negative for congestion, rhinorrhea, sneezing and sore throat.   Eyes: Negative for photophobia, pain and redness.  Respiratory: Negative for cough, chest tightness and shortness of breath.   Cardiovascular: Negative for chest pain and palpitations.  Gastrointestinal: Negative for abdominal pain, constipation, diarrhea, nausea and vomiting.  Endocrine: Negative.   Genitourinary: Positive for dysuria, frequency, urgency and vaginal pain.  Musculoskeletal: Negative for arthralgias, back pain, joint swelling and neck pain.  Skin: Negative for rash.  Allergic/Immunologic: Negative.   Neurological: Negative for tremors and  numbness.  Hematological: Negative for adenopathy. Does not bruise/bleed easily.  Psychiatric/Behavioral: Negative for behavioral problems and sleep disturbance. The patient is not nervous/anxious.     Physical Exam Vitals signs and nursing note reviewed.  Constitutional:      General: She is not in acute distress.    Appearance: She is well-developed. She is not diaphoretic.  HENT:     Head: Normocephalic and atraumatic.     Mouth/Throat:     Pharynx: No oropharyngeal exudate.  Eyes:     Pupils: Pupils are equal, round, and  reactive to light.  Neck:     Musculoskeletal: Normal range of motion and neck supple.     Thyroid: No thyromegaly.     Vascular: No JVD.     Trachea: No tracheal deviation.  Cardiovascular:     Rate and Rhythm: Normal rate and regular rhythm.     Heart sounds: Normal heart sounds. No murmur. No friction rub. No gallop.   Pulmonary:     Effort: Pulmonary effort is normal. No respiratory distress.     Breath sounds: Normal breath sounds. No wheezing or rales.  Chest:     Chest wall: No tenderness.  Abdominal:     Palpations: Abdomen is soft.     Tenderness: There is no abdominal tenderness. There is no guarding.  Musculoskeletal: Normal range of motion.  Lymphadenopathy:     Cervical: No cervical adenopathy.  Skin:    General: Skin is warm and dry.  Neurological:     Mental Status: She is alert and oriented to person, place, and time.     Cranial Nerves: No cranial nerve deficit.  Psychiatric:        Behavior: Behavior normal.        Thought Content: Thought content normal.        Judgment: Judgment normal.    Assessment/Plan: 1. Acute cystitis without hematuria Use Pyridium as discussed. Advised patient to take entire course of antibiotics as prescribed with food. Pt should return to clinic in 7-10 days if symptoms fail to improve or new symptoms develop.  - levofloxacin (LEVAQUIN) 500 MG tablet; Take 1 tablet (500 mg total) by mouth daily.  Dispense: 7 tablet; Refill: 0 - phenazopyridine (PYRIDIUM) 200 MG tablet; Take 1 tablet (200 mg total) by mouth 3 (three) times daily as needed for pain.  Dispense: 10 tablet; Refill: 0  2. Nicotine dependence with current use Smoking cessation counseling: 1. Pt acknowledges the risks of long term smoking, she will try to quite smoking. 2. Options for different medications including nicotine products, chewing gum, patch etc, Wellbutrin and Chantix is discussed 3. Goal and date of compete cessation is discussed 4. Total time spent in  smoking cessation is 15 min.   3. Dysuria Culture will be sent due to patients AZO use. - UA/M w/rflx Culture, Routine  General Counseling: Stephanie Hess verbalizes understanding of the findings of todays visit and agrees with plan of treatment. I have discussed any further diagnostic evaluation that may be needed or ordered today. We also reviewed her medications today. she has been encouraged to call the office with any questions or concerns that should arise related to todays visit.   Orders Placed This Encounter  Procedures  . Microscopic Examination  . Urine Culture, Reflex  . UA/M w/rflx Culture, Routine    Meds ordered this encounter  Medications  . levofloxacin (LEVAQUIN) 500 MG tablet    Sig: Take 1 tablet (500 mg total) by mouth daily.  Dispense:  7 tablet    Refill:  0  . phenazopyridine (PYRIDIUM) 200 MG tablet    Sig: Take 1 tablet (200 mg total) by mouth 3 (three) times daily as needed for pain.    Dispense:  10 tablet    Refill:  0    Time spent: 20 Minutes  This patient was seen by Orson Gear AGNP-C in Collaboration with Dr Lavera Guise as a part of collaborative care agreement.  Kendell Bane AGNP-C Internal Medicine

## 2019-02-15 LAB — MICROSCOPIC EXAMINATION: Casts: NONE SEEN /lpf

## 2019-02-15 LAB — UA/M W/RFLX CULTURE, ROUTINE
Bilirubin, UA: NEGATIVE
Glucose, UA: NEGATIVE
Ketones, UA: NEGATIVE
Leukocytes,UA: NEGATIVE
Nitrite, UA: POSITIVE — AB
Protein,UA: NEGATIVE
RBC, UA: NEGATIVE
Specific Gravity, UA: 1.006 (ref 1.005–1.030)
Urobilinogen, Ur: 0.2 mg/dL (ref 0.2–1.0)
pH, UA: 5.5 (ref 5.0–7.5)

## 2019-02-15 LAB — URINE CULTURE, REFLEX

## 2019-02-16 ENCOUNTER — Other Ambulatory Visit: Payer: Self-pay | Admitting: Adult Health

## 2019-02-16 ENCOUNTER — Telehealth: Payer: Self-pay

## 2019-02-16 DIAGNOSIS — N3000 Acute cystitis without hematuria: Secondary | ICD-10-CM

## 2019-02-16 NOTE — Telephone Encounter (Signed)
Spoke with pt about her UTI  result she is not having any symptoms now little discomfort as per khan advised pt we going repeat  Urine in 2 weeks and meanwhile if she having any symptoms call us back and also advised to drinks plenty of water and cranberry juice

## 2019-02-16 NOTE — Progress Notes (Signed)
UA and culture ordered

## 2019-02-16 NOTE — Telephone Encounter (Deleted)
-----   Message from Lavera Guise, MD sent at 02/16/2019 12:25 PM EDT ----- Please let pt know her urine cx is back and Levaquin is not sensitive, we can change to macrobid ( 100 mg po bid x 10 days  or Augmentin ( 875 MG PO BID) X 7 days

## 2019-03-03 ENCOUNTER — Other Ambulatory Visit: Payer: Self-pay | Admitting: Adult Health

## 2019-03-04 ENCOUNTER — Ambulatory Visit: Payer: Self-pay | Admitting: Internal Medicine

## 2019-03-04 LAB — MICROSCOPIC EXAMINATION
Casts: NONE SEEN /lpf
Epithelial Cells (non renal): NONE SEEN /hpf (ref 0–10)
WBC, UA: NONE SEEN /hpf (ref 0–5)

## 2019-03-04 LAB — UA/M W/RFLX CULTURE, COMP
Bilirubin, UA: NEGATIVE
Glucose, UA: NEGATIVE
Ketones, UA: NEGATIVE
Leukocytes,UA: NEGATIVE
Nitrite, UA: NEGATIVE
Protein,UA: NEGATIVE
RBC, UA: NEGATIVE
Specific Gravity, UA: 1.007 (ref 1.005–1.030)
Urobilinogen, Ur: 0.2 mg/dL (ref 0.2–1.0)
pH, UA: 5.5 (ref 5.0–7.5)

## 2019-03-05 ENCOUNTER — Other Ambulatory Visit: Payer: Self-pay | Admitting: Obstetrics and Gynecology

## 2019-03-05 NOTE — Telephone Encounter (Signed)
Printed and put on MS desk

## 2019-03-19 ENCOUNTER — Telehealth: Payer: Self-pay

## 2019-03-19 NOTE — Telephone Encounter (Signed)
Called patient from recall list.  Patient stated she would like to be deleted off the list.  Patient would not like a call.  Patient will speak with PCP and go from there.  Will delete recall.

## 2019-03-23 ENCOUNTER — Encounter: Payer: Self-pay | Admitting: Adult Health

## 2019-04-13 ENCOUNTER — Other Ambulatory Visit: Payer: Self-pay | Admitting: Obstetrics and Gynecology

## 2019-04-21 ENCOUNTER — Other Ambulatory Visit: Payer: Self-pay | Admitting: Gastroenterology

## 2019-04-23 ENCOUNTER — Other Ambulatory Visit: Payer: Self-pay | Admitting: Gastroenterology

## 2019-04-23 DIAGNOSIS — R1032 Left lower quadrant pain: Secondary | ICD-10-CM

## 2019-04-23 DIAGNOSIS — N3000 Acute cystitis without hematuria: Secondary | ICD-10-CM

## 2019-04-23 DIAGNOSIS — R1031 Right lower quadrant pain: Secondary | ICD-10-CM

## 2019-04-27 ENCOUNTER — Telehealth: Payer: Self-pay

## 2019-04-27 NOTE — Telephone Encounter (Signed)
Coronavirus (COVID-19) Are you at risk?  Are you at risk for the Coronavirus (COVID-19)?  To be considered HIGH RISK for Coronavirus (COVID-19), you have to meet the following criteria:  . Traveled to [place], [place], [place], [place] or [place]; or in the [place] to [place], [place], [place], or [place]; and have fever, cough, and shortness of breath within the last 2 weeks of travel OR . Been in close contact with a person diagnosed with COVID-19 within the last 2 weeks and have fever, cough, and shortness of breath . IF YOU DO NOT MEET THESE CRITERIA, YOU ARE CONSIDERED LOW RISK FOR COVID-19.  What to do if you are HIGH RISK for COVID-19?  Marland Kitchen If you are having a medical emergency, call 911. . Seek medical care right away. Before you go to a doctor's office, urgent care or emergency department, call ahead and tell them about your recent travel, contact with someone diagnosed with COVID-19, and your symptoms. You should receive instructions from your physician's office regarding next steps of care.  . When you arrive at healthcare provider, tell the healthcare staff immediately you have returned from visiting [place], [place], [place], [place] or [place]; or traveled in the [place] to [place], [place], [place], or [place]; in the last two weeks or you have been in close contact with a person diagnosed with COVID-19 in the last 2 weeks.   . Tell the health care staff about your symptoms: fever, cough and shortness of breath. . After you have been seen by a medical provider, you will be either: o Tested for (COVID-19) and discharged home on quarantine except to seek medical care if symptoms worsen, and asked to  - Stay home and avoid contact with others until you get your results (4-5 days)  - Avoid travel on public transportation if possible (such as bus, train, or airplane) or o Sent to the Emergency Department by EMS for evaluation, COVID-19 testing, and possible  admission depending on your condition and test results.  What to do if you are LOW RISK for COVID-19?  Reduce your risk of any infection by using the same precautions used for avoiding the common cold or flu:  Marland Kitchen Wash your hands often with soap and warm water for at least 20 seconds.  If soap and water are not readily available, use an alcohol-based hand sanitizer with at least 60% alcohol.  . If coughing or sneezing, cover your mouth and nose by coughing or sneezing into the elbow areas of your shirt or coat, into a tissue or into your sleeve (not your hands). . Avoid shaking hands with others and consider head nods or verbal greetings only. . Avoid touching your eyes, nose, or mouth with unwashed hands.  . Avoid close contact with people who are Henessy Rohrer. . Avoid places or events with large numbers of people in one location, like concerts or sporting events. . Carefully consider travel plans you have or are making. . If you are planning any travel outside or inside the [place], visit the CDC's Travelers' Health webpage for the latest health notices. . If you have some symptoms but not all symptoms, continue to monitor at home and seek medical attention if your symptoms worsen. . If you are having a medical emergency, call 911.  04/27/19 SCREENING NEG SLS ADDITIONAL HEALTHCARE OPTIONS FOR PATIENTS  [facility] / e-Visit: [email or web address]         [facility]: [phone]  [facility]: [phone]                   [facility]: [phone]  

## 2019-04-28 ENCOUNTER — Ambulatory Visit (INDEPENDENT_AMBULATORY_CARE_PROVIDER_SITE_OTHER): Payer: 59 | Admitting: Obstetrics and Gynecology

## 2019-04-28 ENCOUNTER — Ambulatory Visit
Admission: RE | Admit: 2019-04-28 | Discharge: 2019-04-28 | Disposition: A | Discharge status: Home / Self Care | Payer: 59 | Source: Ambulatory Visit | Attending: Gastroenterology | Admitting: Gastroenterology

## 2019-04-28 ENCOUNTER — Encounter: Payer: Self-pay | Admitting: Obstetrics and Gynecology

## 2019-04-28 ENCOUNTER — Other Ambulatory Visit: Payer: Self-pay

## 2019-04-28 VITALS — BP 120/77 | HR 112 | Ht 62.0 in | Wt 156.6 lb

## 2019-04-28 DIAGNOSIS — R102 Pelvic and perineal pain unspecified side: Secondary | ICD-10-CM

## 2019-04-28 DIAGNOSIS — N3000 Acute cystitis without hematuria: Secondary | ICD-10-CM | POA: Insufficient documentation

## 2019-04-28 DIAGNOSIS — R1032 Left lower quadrant pain: Secondary | ICD-10-CM | POA: Insufficient documentation

## 2019-04-28 DIAGNOSIS — R350 Frequency of micturition: Secondary | ICD-10-CM

## 2019-04-28 DIAGNOSIS — R1031 Right lower quadrant pain: Secondary | ICD-10-CM | POA: Diagnosis present

## 2019-04-28 NOTE — Progress Notes (Signed)
  Subjective:     Patient ID: Stephanie Hess, female   DOB: 1951/02/27, 68 y.o.   MRN: [id number]  HPI Here with concerns about pelvic pressure and sharp LLQ pain since last week. Also has pain at bladder stem that makes her feel like she has to urinate or trying to pass a stone. Denies any h/o renal stones in past. Is currently being treated for C. Diff and has diverticulitis- has CT scan this afternoon.   Review of Systems  Gastrointestinal: Positive for abdominal pain and diarrhea.  Genitourinary: Positive for pelvic pain and urgency.  All other systems reviewed and are negative.      Objective:   Physical Exam A&Ox4 Well groomed female in mild distress Blood pressure 120/77, pulse (!) 112, height 5\' 2"  (1.575 m), weight 156 lb 9.6 oz (71 kg). Urinalysis    Component Value Date/Time   COLORURINE COLORLESS (A) 03/01/2016 1447   APPEARANCEUR Clear 03/03/2019 1326   LABSPEC 1.001 (L) 03/01/2016 1447   PHURINE 6.0 03/01/2016 1447   GLUCOSEU Negative 03/03/2019 1326   HGBUR NEGATIVE 03/01/2016 1447   BILIRUBINUR Negative 03/03/2019 1326   KETONESUR NEGATIVE 03/01/2016 1447   PROTEINUR Negative 03/03/2019 1326   PROTEINUR NEGATIVE 03/01/2016 1447   UROBILINOGEN 0.2 12/14/2018 1619   NITRITE Negative 03/03/2019 1326   NITRITE NEGATIVE 03/01/2016 1447   LEUKOCYTESUR Negative 03/03/2019 1326  abdomen soft and not tender dimished bowel sounds throughout Pelvic exam: normal external genitalia, vulva, vagina, cervix, uterus and adnexa. Scant green discharge noted inside of cervix- swab obtained.    Assessment:     LLQ pelvic pain Urethral pain    Plan:     Reassured of normal findings, urine sent for culture and NuSwab obtained-will follow up accordingly. Feel like lower abdominal pain is associated with bowel issues. No signs of UTI or stone. Patient reassured.   Lamonica Trueba,CNM

## 2019-04-29 ENCOUNTER — Ambulatory Visit: Payer: 59 | Attending: Infectious Diseases | Admitting: Infectious Diseases

## 2019-04-29 ENCOUNTER — Encounter: Payer: Self-pay | Admitting: Infectious Diseases

## 2019-04-29 VITALS — BP 143/77 | HR 116 | Temp 97.7°F | Ht 62.0 in | Wt 156.0 lb

## 2019-04-29 DIAGNOSIS — N393 Stress incontinence (female) (male): Secondary | ICD-10-CM

## 2019-04-29 DIAGNOSIS — K5900 Constipation, unspecified: Secondary | ICD-10-CM | POA: Diagnosis not present

## 2019-04-29 DIAGNOSIS — R1030 Lower abdominal pain, unspecified: Secondary | ICD-10-CM | POA: Diagnosis not present

## 2019-04-29 DIAGNOSIS — Z881 Allergy status to other antibiotic agents status: Secondary | ICD-10-CM

## 2019-04-29 DIAGNOSIS — J449 Chronic obstructive pulmonary disease, unspecified: Secondary | ICD-10-CM

## 2019-04-29 DIAGNOSIS — Z8719 Personal history of other diseases of the digestive system: Secondary | ICD-10-CM

## 2019-04-29 DIAGNOSIS — Z85828 Personal history of other malignant neoplasm of skin: Secondary | ICD-10-CM

## 2019-04-29 DIAGNOSIS — Z880 Allergy status to penicillin: Secondary | ICD-10-CM

## 2019-04-29 DIAGNOSIS — Z91041 Radiographic dye allergy status: Secondary | ICD-10-CM

## 2019-04-29 DIAGNOSIS — Z91013 Allergy to seafood: Secondary | ICD-10-CM

## 2019-04-29 DIAGNOSIS — F17210 Nicotine dependence, cigarettes, uncomplicated: Secondary | ICD-10-CM

## 2019-04-29 NOTE — Patient Instructions (Addendum)
You have abdominal cramps with alternating constipation- you have been checked for urine infection many times and given antibiotics with little improvement only it has caused side effects like alteration in your GUT microbiome cdiff, Multidrug resistant bacteria . Do not take antibiotics unless fever or flank pain or before cystoscopy You may have altered gut microbiome due to repeated courses of antibiotic or Irritable bowel syndrome or scar tissue in your abdomen causing pain  Recommend the following  Urogyn consult to look for post void residue /bladder scan ?*Estrace or Premarin cream topically- peasize apply topically three times a week * Cetaphil to clean the genital area ( not soap)  * Cranberry supplement (-Knudsen cranberry concentrate- 1 ounce mixed with 8 ounces of water *wash with water after bowel movt *Probiotic one a day * Increase water consumption- 8 glasses a day *Not to check your urine on a routine basis  *NO antibiotics unless systemic infection like fever / flank pain or before cystoscopy * Kegel Exercise to strengthen pelvic floor

## 2019-04-29 NOTE — Progress Notes (Signed)
NAME: Stephanie Hess  DOB: 08/02/1951  MRN: [id number]  Date/Time: 04/29/2019 9:03 AM  REQUESTING PROVIDER: Heide Spark Subjective:  REASON FOR CONSULT: UTI ? Stephanie Hess is a 68 y.o. female with a history of basal cell carcinoma, COPD, smoker She has had lower abdominal pain for a few months now. She has been to her PCP and they checked her urine and found bacteria in jan 2020 citrobacter and gave her multiple courses of antibiotics - usually levaquin. She was referred to GI and saw Dr.Skulskie 's PA Ellis Savage in [place]y 2020 and was diagnosed with diverticulitis and given cipro and flagyl. As she was continuing to have lower abdominal pain with frequent stools with mucus  alternating with constipation she checked her stool on 04/22/19 for cdiff and it was positive. Bacteria screen for salmonella, campylobacter and e.coli were negative. Pt says the stool that she gave for testing was not liquid or watery. It was a small ball with mucus. She was given vancomycin Po for cdiff and she says it is not helped much. She had a CT abdomen on 04/28/19 which showed Left colonic diverticulosis. No active diverticulitis. Moderate stool burden throughout the colon.Small hiatal hernia. Contrast noted in the distal esophagus and hernia, likely reflux related.  She saw her GYN yesterday ( 6/17) Melody Shambley who did pelvic exam and did urine exam and felt that the abdominal pain was related to bowel issues  Pt says she is constipated intermittently and takes Philips Milk of magnesia once a week for the past 4 weeks  Patient thinks the abdominal cramps is related to what she eats. She started eating a lot of strawberries in April and she thinks that caused pain. The pain is allover her abdomen and some times worse around the middle abdomen- she does not pass gas much. She has no nasuea or vomiting She also says that when she  passes urine she will have to press her lower abdomen to empty the urine  completely as she feels bloated in the abdomen- She has no dysuria, no frequency - she has stress incontinence with coughing or sneezing. 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 She says her work has become   stressful since [employer] took over [employer] and since Feb her work has increased.She has not been sleeping well  Working from home . I Past Medical History:  Diagnosis Date  . Cancer ([place])   . COPD (chronic obstructive pulmonary disease) ([place])   . Diverticula of colon   . GERD (gastroesophageal reflux disease)   . Hyperlipidemia   . Renal insufficiency    2009 or 2010    Past Surgical History:  Procedure Laterality Date  . APPENDECTOMY    . CESAREAN SECTION    . ECTOPIC PREGNANCY SURGERY        SH  Smoker 1/2 PPD Occasional alcohol no illicit drugs Family History  Problem Relation Age of Onset  . Breast cancer Paternal Aunt   . Heart attack Mother    Allergies  Allergen Reactions  . Penicillins Nausea And Vomiting and Other (See Comments)    Has patient had a PCN reaction causing immediate rash, facial/tongue/throat swelling, SOB or lightheadedness with hypotension: No Has patient had a PCN reaction causing severe rash involving mucus membranes or skin necrosis: No Has patient had a PCN reaction that required hospitalization: No Has patient had a PCN reaction occurring within the last 10  years: Yes If all of the above answers are "NO", then may proceed with Cephalosporin use.   . Shellfish Allergy Nausea And Vomiting  . Sulfa Antibiotics Rash and Other (See Comments)    Other reaction(s): Unknown  . Biaxin [Clarithromycin] Rash  . Amoxicillin-Pot Clavulanate Nausea And Vomiting  . Iodinated Diagnostic Agents Other (See Comments)    acute renal insufficiency acute renal insufficiency  . Atorvastatin Rash  . Macrobid [Nitrofurantoin] Rash   Current medications Vancomycin PO Clorazepate ( Tranxene) prilosec Aspirin Vitamin  D Motrin when needed?    REVIEW OF SYSTEMS:  Const: negative fever, negative chills, some weight - 6 pounds in 4 months Eyes: negative diplopia or visual changes, negative eye pain ENT: negative coryza, negative sore throat Resp: negative cough, hemoptysis, dyspnea Cards: negative for chest pain, palpitations, lower extremity edema GU: negative for frequency, dysuria and hematuria GI: as above Skin: negative for rash and pruritus Heme: negative for easy bruising and gum/nose bleeding MS: negative for myalgias, arthralgias, back pain and muscle weakness, has fatigue Neurolo:negative for headaches, dizziness, vertigo, memory problems  Psych: negative for feelings of anxiety, depression  Endocrine: negative for thyroid, diabetes issues Allergy/Immunology- negative for any medication or food allergies ?  Objective:  VITALS:  Ht 5\' 2"  (1.575 m)   Wt 156 lb (70.8 kg)   BMI 28.53 kg/m  PHYSICAL EXAM:  General: Alert, cooperative, no distress, appears stated age. Looks fatigue Head: Normocephalic, without obvious abnormality, atraumatic. Eyes: Conjunctivae clear, anicteric sclerae. Pupils are equal ENT Nares normal. No drainage or sinus tenderness. Lips, mucosa, and tongue normal. No Thrush Neck: Supple, symmetrical, no adenopathy, thyroid: non tender no carotid bruit and no JVD. Back: No CVA tenderness. Lungs: Clear to auscultation bilaterally. No Wheezing or Rhonchi. No rales. Heart: Regular rate and rhythm, no murmur, rub or gallop. Abdomen: soft, mild tenderness all over abdomen No mass External geintal exam normal Extremities: atraumatic, no cyanosis. No edema. No clubbing Skin: No rashes or lesions. Or bruising Lymph: Cervical, supraclavicular normal. Neurologic: Grossly non-focal Pertinent Labs Lab Results   CMP Latest Ref Rng & Units 08/12/2018 07/16/2018 06/24/2017  Glucose 65 - 99 mg/dL 122(H) 134(H) -  BUN 8 - 27 mg/dL 10 11 -  Creatinine 0.57 - 1.00 mg/dL 0.69 0.79  0.70  Sodium 134 - 144 mmol/L 136 137 -  Potassium 3.5 - 5.2 mmol/L 4.4 4.2 -  Chloride 96 - 106 mmol/L 101 105 -  CO2 20 - 29 mmol/L 19(L) 26 -  Calcium 8.7 - 10.3 mg/dL 9.9 9.7 -  Total Protein 6.0 - 8.5 g/dL 7.7 - -  Total Bilirubin 0.0 - 1.2 mg/dL <0.2 - -  Alkaline Phos 39 - 117 IU/L 87 - -  AST 0 - 40 IU/L 37 - -  ALT 0 - 32 IU/L 43(H) - -      Microbiology: No results found for this or any previous visit (from the past 240 hour(s)).  IMAGING RESULTS: I have personally reviewed the films ? Impression/Recommendation 68 y.o. female with a history of basal cell carcinoma, COPD, smoker She has had lower abdominal pain for a few months now. She has been to her PCP and they checked her urine and found bacteria in jan citrobacter and gave her multiple courses of antibiotics - usually levaquin.   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  Recommend the following  Urogyn consult to look for post void residue /bladder scan ? *Estrace or Premarin cream topically- peasize apply topically three times a week * Cetaphil to clean the genital area ( not soap)  * Cranberry supplement (-Knudsen cranberry concentrate- 1 ounce mixed with 8 ounces of water *wash with water after bowel movt *Probiotic ( can try Pearls vaginal health one a day ) * Increase water consumption- 8 glasses a day *Not  to check your urine  *NO antibiotics unless systemic infection like fever / flank pain or before cystoscopy * Kegel Exercise to strengthen pelvic floor_________________________________________________ Discussed with patient, requesting provider Note:  This document was prepared using Dragon voice recognition software and may include unintentional dictation errors.

## 2019-04-30 LAB — URINE CULTURE

## 2019-05-04 ENCOUNTER — Other Ambulatory Visit: Payer: Self-pay | Admitting: Obstetrics and Gynecology

## 2019-05-04 LAB — NUSWAB VAGINITIS PLUS (VG+)
Candida albicans, NAA: NEGATIVE
Candida glabrata, NAA: NEGATIVE
Chlamydia trachomatis, NAA: NEGATIVE
Neisseria gonorrhoeae, NAA: NEGATIVE
Trich vag by NAA: NEGATIVE

## 2019-05-18 ENCOUNTER — Other Ambulatory Visit: Payer: Self-pay | Admitting: Obstetrics and Gynecology

## 2019-05-27 ENCOUNTER — Ambulatory Visit
Admission: EM | Admit: 2019-05-27 | Discharge: 2019-05-27 | Disposition: A | Discharge status: Home / Self Care | Payer: 59 | Attending: Urgent Care | Admitting: Urgent Care

## 2019-05-27 ENCOUNTER — Other Ambulatory Visit: Payer: Self-pay

## 2019-05-27 DIAGNOSIS — L03011 Cellulitis of right finger: Secondary | ICD-10-CM | POA: Diagnosis not present

## 2019-05-27 MED ORDER — BACITRACIN ZINC 500 UNIT/GM EX OINT
TOPICAL_OINTMENT | Freq: Once | CUTANEOUS | Status: AC
  Administered 2019-05-27: 09:00:00 via TOPICAL

## 2019-05-27 MED ORDER — DOXYCYCLINE HYCLATE 100 MG PO CAPS: 100.0000 mg | ORAL_CAPSULE | Freq: Two times a day (BID) | ORAL | 0 refills | Status: DC

## 2019-05-27 NOTE — ED Provider Notes (Signed)
[place], [place]   Name: Stephanie Hess DOB: 01/09/1951 MRN: [id number] CSN: [id number] PCP: Lavera Guise, MD  Arrival date and time:  05/27/19 0802  Chief Complaint:  Hand Pain   NOTE: Prior to seeing the Stephanie Hess today, I have reviewed the triage nursing documentation and vital signs. Clinical staff has updated Stephanie Hess's PMH/PSHx, current medication list, and drug allergies/intolerances to ensure comprehensive history available to assist in medical decision making.   History:   HPI: Stephanie Hess is a 68 y.o. female who presents today with complaints of pain and swelling to the 2nd digit of her RIGHT hand. Stephanie Hess notes that symptoms started on Tuesday (05/25/2019). Stephanie Hess had nails done last week and thinks that this may be the cause. There is a visible pocket of purulent material adjacent to the lateral nail fold. Finger has not drained. Stephanie Hess denies any fevers. Stephanie Hess is exquisitely tender to touch.   Past Medical History:  Diagnosis Date  . Cancer ([place])   . COPD (chronic obstructive pulmonary disease) ([place])   . Diverticula of colon   . GERD (gastroesophageal reflux disease)   . Hyperlipidemia   . Renal insufficiency    2009 or 2010    Past Surgical History:  Procedure Laterality Date  . APPENDECTOMY    . CESAREAN SECTION    . ECTOPIC PREGNANCY SURGERY      Family History  Problem Relation Age of Onset  . Breast cancer Paternal Aunt   . Heart attack Mother     Social History   Tobacco Use  . Smoking status: Current Every Day Smoker    Packs/day: 0.50    Years: 50.00    Pack years: 25.00    Types: Cigarettes  . Smokeless tobacco: Never Used  Substance Use Topics  . Alcohol use: Yes    Comment: social  . Drug use: No    Stephanie Hess Active Problem List   Diagnosis Date Noted  . Acute upper respiratory infection 12/20/2018  . Sore throat 12/20/2018  . Fever and chills 12/20/2018  . Hemoptysis 12/20/2018  . Urinary tract infection without hematuria  11/25/2018  . Dysuria 11/25/2018  . Respiratory distress 07/16/2018  . Sternoclavicular joint pain 04/01/2018  . Bronchiectasis with acute lower respiratory infection ([place]) 03/03/2018  . Nicotine abuse 03/03/2018  . Mild intermittent asthma without complication 29/93/7169  . Pulmonary nodule 03/03/2018  . Gastroesophageal reflux disease without esophagitis 03/03/2018  . Hyperlipidemia 05/15/2017  . Vitamin D deficiency 05/15/2017    Home Medications:    No outpatient medications have been marked as taking for the 05/27/19 encounter [facility] Encounter).    Allergies:   Penicillins, Shellfish allergy, Sulfa antibiotics, Biaxin [clarithromycin], Amoxicillin-pot clavulanate, Iodinated diagnostic agents, Atorvastatin, and Macrobid [nitrofurantoin]  Review of Systems (ROS): Review of Systems  Constitutional: Negative for chills and fever.  Respiratory: Negative for cough and shortness of breath.   Cardiovascular: Negative for chest pain and palpitations.  Gastrointestinal: Negative for diarrhea, nausea and vomiting.  Musculoskeletal:       Acute pain of RIGHT index finger  Skin: Positive for color change and wound.     Vital Signs: Today's Vitals   05/27/19 0812 05/27/19 0813 05/27/19 0844 05/27/19 0905  BP: 126/78     Pulse: 88     Resp: 16     Temp: 97.7 F (36.5 C)     TempSrc: Oral     SpO2: 97%     Weight:   152 lb (68.9 kg)   Height:  5\' 2"  (1.575 m)    PainSc:  10-Worst pain ever  5     Physical Exam: Physical Exam  Constitutional: Stephanie Hess is oriented to person, place, and time and well-developed, well-nourished, and in no distress.  HENT:  Head: Normocephalic and atraumatic.  Mouth/Throat: Mucous membranes are normal.  Cardiovascular: Normal rate, regular rhythm, normal heart sounds and intact distal pulses.  Pulmonary/Chest: Effort normal and breath sounds normal.  Musculoskeletal:     Right hand: Stephanie Hess exhibits tenderness and swelling. Stephanie Hess exhibits normal range  of motion, no bony tenderness, normal two-point discrimination and normal capillary refill. Normal sensation noted. Normal strength noted.       Hands:  Neurological: Stephanie Hess is alert and oriented to person, place, and time. Gait normal. GCS score is 15.  Skin: Skin is warm and dry. No rash noted.  Psychiatric: Mood, memory, affect and judgment normal.  Nursing note and vitals reviewed.   Urgent Care Treatments / Results:   LABS: PLEASE NOTE: all labs that were ordered this encounter are listed, however only abnormal results are displayed. Labs Reviewed - No data to display  EKG: -None  RADIOLOGY: No results found.  PROCEDURES: Incision and Drainage  Date/Time: 05/27/2019 9:17 AM Performed by: Karen Kitchens, NP Authorized by: Karen Kitchens, NP   Consent:    Consent obtained:  Verbal   Consent given by:  Stephanie Hess   Risks discussed:  Bleeding, incomplete drainage, pain and infection   Alternatives discussed:  Delayed treatment and referral Location:    Indications for incision and drainage: paronychia.   Location: 2nd digit right hand. Pre-procedure details:    Skin preparation:  Antiseptic wash and Betadine Anesthesia (see MAR for exact dosages):    Anesthesia method:  Topical application   Topical anesthesia: Pain Ease cold spray. Procedure type:    Complexity:  Simple Procedure details:    Needle aspiration: yes     Needle size:  20 G   Incision types:  Stab incision   Incision depth:  Subcutaneous   Scalpel blade:  11   Techniques: Manual expression.   Drainage:  Purulent and bloody   Drainage amount:  Moderate   Wound treatment:  Wound left open   Packing materials:  None Post-procedure details:    Stephanie Hess tolerance of procedure:  Tolerated well, no immediate complications Comments:     Cleaned with NS and bacitracin applied by nursing    MEDICATIONS RECEIVED THIS VISIT: Medications  bacitracin ointment ( Topical Given 05/27/19 0905)    PERTINENT CLINICAL  COURSE NOTES/UPDATES:   Initial Impression / Assessment and Plan / Urgent Care Course:  Pertinent labs & imaging results that were available during my care of the Stephanie Hess were personally reviewed by me and considered in my medical decision making (see lab/imaging section of note for values and interpretations).  Stephanie Hess is a 68 y.o. female who presents to [facility] today with complaints of Hand Pain   Stephanie Hess is well appearing overall in clinic today. Stephanie Hess does not appear to be in any acute distress. Presenting symptoms (see HPI) and exam as documented above. Paronychia drained of a moderate amount of sanguinopurulent material. Wound cleaned; mupirocin and pressure dressing applied. PMH (+) for recurrent C.diff infections; limited on antibiotics that can be used per Stephanie Hess report. Will cover with a 5 day course of doxycycline. Stephanie Hess advised to contact PCP or this clinic should Stephanie Hess develop any concerning diarrhea or loose stools; will need C.diff testing if this  is occurs. Stephanie Hess encouraged to soak hand in warm Epson salt water twice a day. May use Tylenol and/or Ibuprofen as needed for pain/fever.   Discussed follow up with primary care physician in 1 week for re-evaluation. I have reviewed the follow up and strict return precautions for any new or worsening symptoms. Stephanie Hess is aware of symptoms that would be deemed urgent/emergent, and would thus require further evaluation either here or in the emergency department. At the time of discharge, Stephanie Hess verbalized understanding and consent with the discharge plan as it was reviewed with her. All questions were fielded by provider and/or clinic staff prior to Stephanie Hess discharge.    Final Clinical Impressions / Urgent Care Diagnoses:   Final diagnoses:  Paronychia of right index finger    New Prescriptions:  [place] Controlled Substance Registry consulted? Not Applicable  Meds ordered this encounter  Medications  . bacitracin ointment   . doxycycline (VIBRAMYCIN) 100 MG capsule    Sig: Take 1 capsule (100 mg total) by mouth 2 (two) times daily.    Dispense:  10 capsule    Refill:  0    Recommended Follow up Care:  Stephanie Hess encouraged to follow up with the following provider within the specified time frame, or sooner as dictated by the severity of her symptoms. As always, Stephanie Hess was instructed that for any urgent/emergent care needs, Stephanie Hess should seek care either here or in the emergency department for more immediate evaluation.  Follow-up Information    Lavera Guise, MD.   Specialty: Internal Medicine Contact information: [place] [address] [phone]         NOTE: This note was prepared using Dragon dictation software along with smaller phrase technology. Despite my best ability to proofread, there is the potential that transcriptional errors may still occur from this process, and are completely unintentional.     Karen Kitchens, NP 05/27/19 1012

## 2019-05-27 NOTE — Discharge Instructions (Addendum)
It was very nice seeing you today in clinic. Thank you for entrusting me with your care.   Please utilize the medications that we discussed. Your prescriptions have been called in to your pharmacy. Soak your finger/hand in warm Epson salt water TWICE a day.   Make arrangements to follow up with your regular doctor in 1 week for re-evaluation if not improving. If your symptoms/condition worsens, please seek follow up care either here or in the ER. Please remember, our [facility] providers are "right here with you" when you need [place].   Again, it was my pleasure to take care of you today. Thank you for choosing our clinic. I hope that you start to feel better quickly.   Honor Loh, MSN, APRN, FNP-C, CEN Advanced Practice Provider [facility]

## 2019-05-27 NOTE — ED Triage Notes (Signed)
Pt with right hand index finger paronychia after having her nails done

## 2019-06-15 ENCOUNTER — Ambulatory Visit: Payer: Self-pay | Admitting: Internal Medicine

## 2019-06-23 ENCOUNTER — Ambulatory Visit (INDEPENDENT_AMBULATORY_CARE_PROVIDER_SITE_OTHER): Payer: 59 | Admitting: Urology

## 2019-06-23 ENCOUNTER — Encounter: Payer: Self-pay | Admitting: Urology

## 2019-06-23 ENCOUNTER — Telehealth: Payer: Self-pay | Admitting: Urology

## 2019-06-23 ENCOUNTER — Other Ambulatory Visit: Payer: Self-pay

## 2019-06-23 VITALS — BP 119/75 | HR 112 | Ht 62.0 in | Wt 150.0 lb

## 2019-06-23 DIAGNOSIS — R350 Frequency of micturition: Secondary | ICD-10-CM | POA: Diagnosis not present

## 2019-06-23 DIAGNOSIS — R102 Pelvic and perineal pain: Secondary | ICD-10-CM

## 2019-06-23 DIAGNOSIS — N393 Stress incontinence (female) (male): Secondary | ICD-10-CM

## 2019-06-23 DIAGNOSIS — R8271 Bacteriuria: Secondary | ICD-10-CM | POA: Diagnosis not present

## 2019-06-23 DIAGNOSIS — R3916 Straining to void: Secondary | ICD-10-CM | POA: Diagnosis not present

## 2019-06-23 LAB — BLADDER SCAN AMB NON-IMAGING: Scan Result: 100

## 2019-06-23 MED ORDER — UROGESIC-BLUE 81.6 MG PO TABS: 1.0000 | ORAL_TABLET | Freq: Three times a day (TID) | ORAL | 3 refills | Status: DC | PRN

## 2019-06-23 NOTE — Patient Instructions (Signed)
Urinary Frequency, Adult Urinary frequency means urinating more often than usual. You may urinate every 1-2 hours even though you drink a normal amount of fluid and do not have a bladder infection or condition. Although you urinate more often than normal, the total amount of urine produced in a day is normal. With urinary frequency, you may have an urgent need to urinate often. The stress and anxiety of needing to find a bathroom quickly can make this urge worse. This condition may go away on its own or you may need treatment at home. Home treatment may include bladder training, exercises, taking medicines, or making changes to your diet. Follow these instructions at home: Bladder health   Keep a bladder diary if told by your health care provider. Keep track of: ? What you eat and drink. ? How often you urinate. ? How much you urinate.  Follow a bladder training program if told by your health care provider. This may include: ? Learning to delay going to the bathroom. ? Double urinating (voiding). This helps if you are not completely emptying your bladder. ? Scheduled voiding.  Do Kegel exercises as told by your health care provider. Kegel exercises strengthen the muscles that help control urination, which may help the condition. Eating and drinking  If told by your health care provider, make diet changes, such as: ? Avoiding caffeine. ? Drinking fewer fluids, especially alcohol. ? Not drinking in the evening. ? Avoiding foods or drinks that may irritate the bladder. These include coffee, tea, soda, artificial sweeteners, citrus, tomato-based foods, and chocolate. ? Eating foods that help prevent or ease constipation. Constipation can make this condition worse. Your health care provider may recommend that you:  Drink enough fluid to keep your urine pale yellow.  Take over-the-counter or prescription medicines.  Eat foods that are high in fiber, such as beans, whole grains, and fresh  fruits and vegetables.  Limit foods that are high in fat and processed sugars, such as fried or sweet foods. General instructions  Take over-the-counter and prescription medicines only as told by your health care provider.  Keep all follow-up visits as told by your health care provider. This is important. Contact a health care provider if:  You start urinating more often.  You feel pain or irritation when you urinate.  You notice blood in your urine.  Your urine looks cloudy.  You develop a fever.  You begin vomiting. Get help right away if:  You are unable to urinate. Summary  Urinary frequency means urinating more often than usual. With urinary frequency, you may urinate every 1-2 hours even though you drink a normal amount of fluid and do not have a bladder infection or other bladder condition.  Your health care provider may recommend that you keep a bladder diary, follow a bladder training program, or make dietary changes.  If told by your health care provider, do Kegel exercises to strengthen the muscles that help control urination.  Take over-the-counter and prescription medicines only as told by your health care provider.  Contact a health care provider if your symptoms do not improve or get worse. This information is not intended to replace advice given to you by your health care provider. Make sure you discuss any questions you have with your health care provider. Document Released: 08/24/2009 Document Revised: 05/07/2018 Document Reviewed: 05/07/2018 Elsevier Patient Education  2020 [employer].

## 2019-06-23 NOTE — Progress Notes (Signed)
06/23/2019 10:24 AM   Stephanie Hess 03/14/1951 [id number]  Referring provider: Lavera Hess, [place],  [place] [address]  Chief Complaint  Patient presents with  . Dysuria    HPI:  Stephanie Hess was referred over for pelvic pain. Since Jan 2020 she feels pain where she "pees". It is constant wether she is voiding or not. The pain does not improve with voiding. Pain is intermittent. She denies straining to void or pushing to have the urine flow. She drinks a lot of water and can void about every 1-2 hours. No gross hematuria. She has no urgency. She tried abx and they helped the pain but the pain returned. She also developed c. Diff on one occasion. No constipation. No NG risk. She wears a pad due to small leak with a sneeze or cough which has been going on for 10+ years. Bladder scan today 100 ml. No GU history or surgery. She tried cranberry. UA with rare bacteria and 11-30 wbc.   She has had a lot of GI issues recently and was seen by GI. Normal pelvic exam with her Gyn. A CT scan of the abdomen and pelvis was done 04/28/2019 which revealed a normal urinary tract.  The bladder was moderately distended on the exam but not thick-walled.  She had a normal urinalysis and a creatinine of 0.7.  Modifying factors: There are no other modifying factors  Associated signs and symptoms: There are no other associated signs and symptoms Aggravating and relieving factors: There are no other aggravating or relieving factors Severity: Moderate Duration: Persistent    PMH: Past Medical History:  Diagnosis Date  . Cancer ([place])   . COPD (chronic obstructive pulmonary disease) ([place])   . Diverticula of colon   . GERD (gastroesophageal reflux disease)   . Hyperlipidemia   . Renal insufficiency    2009 or 2010    Surgical History: Past Surgical History:  Procedure Laterality Date  . APPENDECTOMY    . CESAREAN SECTION    . ECTOPIC PREGNANCY SURGERY      Home Medications:   Allergies as of 06/23/2019      Reactions   Penicillins Nausea And Vomiting, Other (See Comments)   Has patient had a PCN reaction causing immediate rash, facial/tongue/throat swelling, SOB or lightheadedness with hypotension: No Has patient had a PCN reaction causing severe rash involving mucus membranes or skin necrosis: No Has patient had a PCN reaction that required hospitalization: No Has patient had a PCN reaction occurring within the last 10 years: Yes If all of the above answers are "NO", then may proceed with Cephalosporin use.   Shellfish Allergy Nausea And Vomiting   Sulfa Antibiotics Rash, Other (See Comments)   Other reaction(s): Unknown   Biaxin [clarithromycin] Rash   Amoxicillin-pot Clavulanate Nausea And Vomiting   Iodinated Diagnostic Agents Other (See Comments)   acute renal insufficiency acute renal insufficiency   Atorvastatin Rash   Macrobid [nitrofurantoin] Rash      Medication List       Accurate as of June 23, 2019 10:24 AM. If you have any questions, ask your nurse or doctor.        aspirin EC 81 MG tablet Take 1 tablet (81 mg total) by mouth daily.   budesonide-formoterol 160-4.5 MCG/ACT inhaler Commonly known as: SYMBICORT Inhale 2 puffs into the lungs 2 (two) times daily as needed (shortness of breath).   clorazepate 7.5 MG tablet Commonly known as: TRANXENE TAKE 1 TABLET BY  MOUTH AT BEDTIME AS NEEDED FOR ANXIETY   dicyclomine 20 MG tablet Commonly known as: BENTYL Take 20 mg by mouth every 6 (six) hours.   doxycycline 100 MG capsule Commonly known as: VIBRAMYCIN Take 1 capsule (100 mg total) by mouth 2 (two) times daily.   ibuprofen 800 MG tablet Commonly known as: ADVIL Take 1 tablet (800 mg total) by mouth every 8 (eight) hours as needed.   nitroGLYCERIN 0.4 MG SL tablet Commonly known as: NITROSTAT Place 1 tablet (0.4 mg total) under the tongue every 5 (five) minutes as needed.   omeprazole 20 MG capsule Commonly known as:  PRILOSEC Take 20 mg by mouth daily.   OXYGEN Inhale into the lungs. 2 litre at night   Vitamin D (Ergocalciferol) 1.25 MG (50000 UT) Caps capsule Commonly known as: DRISDOL Take 1 capsule (50,000 Units total) by mouth 2 (two) times a week.       Allergies:  Allergies  Allergen Reactions  . Penicillins Nausea And Vomiting and Other (See Comments)    Has patient had a PCN reaction causing immediate rash, facial/tongue/throat swelling, SOB or lightheadedness with hypotension: No Has patient had a PCN reaction causing severe rash involving mucus membranes or skin necrosis: No Has patient had a PCN reaction that required hospitalization: No Has patient had a PCN reaction occurring within the last 10 years: Yes If all of the above answers are "NO", then may proceed with Cephalosporin use.   . Shellfish Allergy Nausea And Vomiting  . Sulfa Antibiotics Rash and Other (See Comments)    Other reaction(s): Unknown  . Biaxin [Clarithromycin] Rash  . Amoxicillin-Pot Clavulanate Nausea And Vomiting  . Iodinated Diagnostic Agents Other (See Comments)    acute renal insufficiency acute renal insufficiency  . Atorvastatin Rash  . Macrobid [Nitrofurantoin] Rash    Family History: Family History  Problem Relation Age of Onset  . Breast cancer Paternal Aunt   . Heart attack Mother     Social History:  reports that she has been smoking cigarettes. She has a 25.00 pack-year smoking history. She has never used smokeless tobacco. She reports current alcohol use. She reports that she does not use drugs.  ROS:                                        Physical Exam: There were no vitals taken for this visit.  Constitutional:  Alert and oriented, No acute distress. HEENT: [place] AT, moist mucus membranes.  Trachea midline, no masses. Cardiovascular: No clubbing, cyanosis, or edema. Respiratory: Normal respiratory effort, no increased work of breathing. GI: Abdomen is soft,  nontender, nondistended, no abdominal masses GU: No CVA tenderness Lymph: No cervical or inguinal lymphadenopathy. Skin: No rashes, bruises or suspicious lesions. Neurologic: Grossly intact, no focal deficits, moving all 4 extremities. Psychiatric: Normal mood and affect.  Laboratory Data: Lab Results  Component Value Date   WBC 14.7 (H) 07/16/2018   HGB 13.7 07/16/2018   HCT 39.4 07/16/2018   MCV 91.4 07/16/2018   PLT 277 07/16/2018    Lab Results  Component Value Date   CREATININE 0.69 08/12/2018    No results found for: PSA  No results found for: TESTOSTERONE  Lab Results  Component Value Date   HGBA1C 6.1 (H) 08/12/2018    Urinalysis    Component Value Date/Time   COLORURINE COLORLESS (A) 03/01/2016 1447   APPEARANCEUR Clear  03/03/2019 1326   LABSPEC 1.001 (L) 03/01/2016 1447   PHURINE 6.0 03/01/2016 1447   GLUCOSEU Negative 03/03/2019 1326   HGBUR NEGATIVE 03/01/2016 1447   BILIRUBINUR Negative 03/03/2019 1326   KETONESUR NEGATIVE 03/01/2016 1447   PROTEINUR Negative 03/03/2019 1326   PROTEINUR NEGATIVE 03/01/2016 1447   UROBILINOGEN 0.2 12/14/2018 1619   NITRITE Negative 03/03/2019 1326   NITRITE NEGATIVE 03/01/2016 1447   LEUKOCYTESUR Negative 03/03/2019 1326    Lab Results  Component Value Date   LABMICR Comment 03/03/2019   WBCUA None seen 03/03/2019   LABEPIT None seen 03/03/2019   MUCUS Present 03/03/2019   BACTERIA Few 03/03/2019    Pertinent Imaging: CT a/p images  No results found for this or any previous visit. No results found for this or any previous visit. No results found for this or any previous visit. No results found for this or any previous visit. No results found for this or any previous visit. No results found for this or any previous visit. No results found for this or any previous visit. No results found for this or any previous visit.  Assessment & Plan:    1. Pelvic pain - pain not related to voiding so doesn't seem  to fit with IC or UTI.   2. Frequency, SUI - chronic. She does drink plenty of water.   3. Bacteriuria - reassured normal nature of bacteria in the urine which can actually be preventative of UTI (and c. Diff).   F/u for exam and cystoscopy -- we discussed it and she elects to proceed. Trial of Uribel/orogesic. Also discussed OAB med trial.   - Urinalysis, Complete   No follow-ups on file.  Festus Aloe, MD  [facility] [address] [place], [place] [address] [phone]

## 2019-06-23 NOTE — Telephone Encounter (Signed)
Apolonio Schneiders from [facility] called and stated that they did not have and could not get Urogesic-blue and wanted to know if they could change it to Uribel? I spoke with Dr. Junious Silk and he said that was ok with him.    Sharyn Lull

## 2019-06-24 LAB — MICROSCOPIC EXAMINATION: RBC: NONE SEEN /hpf (ref 0–2)

## 2019-06-24 LAB — URINALYSIS, COMPLETE
Bilirubin, UA: NEGATIVE
Glucose, UA: NEGATIVE
Ketones, UA: NEGATIVE
Nitrite, UA: NEGATIVE
Protein,UA: NEGATIVE
RBC, UA: NEGATIVE
Specific Gravity, UA: <1.005 — ABNORMAL LOW (ref 1.005–1.030)
Urobilinogen, Ur: 0.2 mg/dL (ref 0.2–1.0)
pH, UA: 6 (ref 5.0–7.5)

## 2019-06-26 LAB — CULTURE, URINE COMPREHENSIVE

## 2019-06-30 ENCOUNTER — Telehealth: Payer: Self-pay | Admitting: Family Medicine

## 2019-06-30 NOTE — Telephone Encounter (Signed)
-----   Message from Festus Aloe, MD sent at 06/29/2019  4:43 PM EDT ----- Morey Hummingbird - can you let Ms. Rickles know her culture was positive but not many good abx to pick from given her allergies and what works on the culture. If the uribel is helping we can hold off on abx. She should also start d-mannose supplement - it's over the counter and can help with e coli infections.  ----- Message ----- From: Kyra Manges, CMA Sent: 06/28/2019   8:10 AM EDT To: Festus Aloe, MD   ----- Message ----- From: Interface, [facility] Lab Results In Sent: 06/24/2019  11:37 AM EDT To: Rowe [facility] Clinical

## 2019-06-30 NOTE — Telephone Encounter (Signed)
Patient notified and she states she is not hurting as bad, however she is unable to take the Uribel. She took a dose and had itching on her neck and stomach.

## 2019-07-02 ENCOUNTER — Other Ambulatory Visit: Payer: Self-pay | Admitting: Obstetrics and Gynecology

## 2019-07-06 ENCOUNTER — Ambulatory Visit: Payer: Self-pay | Admitting: Internal Medicine

## 2019-07-21 ENCOUNTER — Ambulatory Visit (INDEPENDENT_AMBULATORY_CARE_PROVIDER_SITE_OTHER): Payer: 59 | Admitting: Urology

## 2019-07-21 ENCOUNTER — Encounter: Payer: Self-pay | Admitting: Urology

## 2019-07-21 ENCOUNTER — Other Ambulatory Visit: Payer: Self-pay

## 2019-07-21 VITALS — BP 118/73 | HR 97 | Ht 62.0 in | Wt 150.0 lb

## 2019-07-21 DIAGNOSIS — R102 Pelvic and perineal pain: Secondary | ICD-10-CM

## 2019-07-21 DIAGNOSIS — R350 Frequency of micturition: Secondary | ICD-10-CM | POA: Diagnosis not present

## 2019-07-21 NOTE — Progress Notes (Signed)
   07/21/19  CC:  Chief Complaint  Patient presents with  . Cysto    HPI:  F/u cystoscopy. Urine culture was + for resistant E coli. It was sensitive to augmentin, or NF but she has these listed as allergies. We discussed them again and she had nausea with PCN and a bad rash with NF. She is on Cipro and dysuria resolved.   Stephanie Hess was referred over for pelvic pain. Since Jan 2020 she feels pain where she "pees". It is constant wether she is voiding or not. The pain does not improve with voiding. Pain is intermittent. She denies straining to void or pushing to have the urine flow. She drinks a lot of water and can void about every 1-2 hours. No gross hematuria. She has no urgency. She tried abx and they helped the pain but the pain returned. She also developed c. Diff on one occasion. No constipation. No NG risk. She wears a pad due to small leak with a sneeze or cough which has been going on for 10+ years. Bladder scan today 100 ml. No GU history or surgery. She tried cranberry. UA with rare bacteria and 11-30 wbc.   She has had a lot of GI issues recently and was seen by GI. Normal pelvic exam with her Gyn. A CT scan of the abdomen and pelvis was done 04/28/2019 which revealed a normal urinary tract.  The bladder was moderately distended on the exam but not thick-walled.  She had a normal urinalysis and a creatinine of 0.7.  There were no vitals taken for this visit. NED. A&Ox3.   No respiratory distress   Abd soft, NT, ND Normal external genitalia with patent urethral meatus Meatus, urethra palpably normal Bladder palpably normal  Chaperone - Sarah - for exam and cystoscopy   Cystoscopy Procedure Note  Patient identification was confirmed, informed consent was obtained, and patient was prepped using Betadine solution.  Lidocaine jelly was administered per urethral meatus.    Procedure: - Flexible cystoscope introduced, without any difficulty.   - Thorough search of the bladder  revealed:    normal urethral meatus    normal urothelium    no stones    no ulcers     no tumors    no urethral polyps    no trabeculation  - Ureteral orifices were normal in position and appearance.  Post-Procedure: - Patient tolerated the procedure well  Assessment/ Plan:  Bladder pain, UTI -no predisposing anatomic factors.  Drink plenty of water, probiotics and d-mannose can also help.   No follow-ups on file.  Festus Aloe, MD

## 2019-07-21 NOTE — Addendum Note (Signed)
Addended by: Festus Aloe R on: 07/21/2019 04:48 PM   Modules accepted: Level of Service

## 2019-07-21 NOTE — Patient Instructions (Signed)
Urinary Tract Infection, Adult  Make sure you drink plenty of water and take a probiotic supplement as well as a d-mannose supplement  A urinary tract infection (UTI) is an infection of any part of the urinary tract. The urinary tract includes:  The kidneys.  The ureters.  The bladder.  The urethra. These organs make, store, and get rid of pee (urine) in the body. What are the causes? This is caused by germs (bacteria) in your genital area. These germs grow and cause swelling (inflammation) of your urinary tract. What increases the risk? You are more likely to develop this condition if:  You have a small, thin tube (catheter) to drain pee.  You cannot control when you pee or poop (incontinence).  You are female, and: ? You use these methods to prevent pregnancy: ? A medicine that kills sperm (spermicide). ? A device that blocks sperm (diaphragm). ? You have low levels of a female hormone (estrogen). ? You are pregnant.  You have genes that add to your risk.  You are sexually active.  You take antibiotic medicines.  You have trouble peeing because of: ? A prostate that is bigger than normal, if you are female. ? A blockage in the part of your body that drains pee from the bladder (urethra). ? A kidney stone. ? A nerve condition that affects your bladder (neurogenic bladder). ? Not getting enough to drink. ? Not peeing often enough.  You have other conditions, such as: ? Diabetes. ? A weak disease-fighting system (immune system). ? Sickle cell disease. ? Gout. ? Injury of the spine. What are the signs or symptoms? Symptoms of this condition include:  Needing to pee right away (urgently).  Peeing often.  Peeing small amounts often.  Pain or burning when peeing.  Blood in the pee.  Pee that smells bad or not like normal.  Trouble peeing.  Pee that is cloudy.  Fluid coming from the vagina, if you are female.  Pain in the belly or lower back. Other  symptoms include:  Throwing up (vomiting).  No urge to eat.  Feeling mixed up (confused).  Being tired and grouchy (irritable).  A fever.  Watery poop (diarrhea). How is this treated? This condition may be treated with:  Antibiotic medicine.  Other medicines.  Drinking enough water. Follow these instructions at home:  Medicines  Take over-the-counter and prescription medicines only as told by your doctor.  If you were prescribed an antibiotic medicine, take it as told by your doctor. Do not stop taking it even if you start to feel better. General instructions  Make sure you: ? Pee until your bladder is empty. ? Do not hold pee for a long time. ? Empty your bladder after sex. ? Wipe from front to back after pooping if you are a female. Use each tissue one time when you wipe.  Drink enough fluid to keep your pee pale yellow.  Keep all follow-up visits as told by your doctor. This is important. Contact a doctor if:  You do not get better after 1-2 days.  Your symptoms go away and then come back. Get help right away if:  You have very bad back pain.  You have very bad pain in your lower belly.  You have a fever.  You are sick to your stomach (nauseous).  You are throwing up. Summary  A urinary tract infection (UTI) is an infection of any part of the urinary tract.  This condition is caused by  germs in your genital area.  There are many risk factors for a UTI. These include having a small, thin tube to drain pee and not being able to control when you pee or poop.  Treatment includes antibiotic medicines for germs.  Drink enough fluid to keep your pee pale yellow. This information is not intended to replace advice given to you by your health care provider. Make sure you discuss any questions you have with your health care provider. Document Released: 04/15/2008 Document Revised: 10/15/2018 Document Reviewed: 05/07/2018 Elsevier Patient Education  2020  [employer].

## 2019-07-22 LAB — URINALYSIS, COMPLETE
Bilirubin, UA: NEGATIVE
Glucose, UA: NEGATIVE
Ketones, UA: NEGATIVE
Leukocytes,UA: NEGATIVE
Nitrite, UA: NEGATIVE
Protein,UA: NEGATIVE
RBC, UA: NEGATIVE
Specific Gravity, UA: <1.005 — ABNORMAL LOW (ref 1.005–1.030)
Urobilinogen, Ur: 0.2 mg/dL (ref 0.2–1.0)
pH, UA: 6.5 (ref 5.0–7.5)

## 2019-07-22 LAB — MICROSCOPIC EXAMINATION
Bacteria, UA: NONE SEEN
RBC, Urine: NONE SEEN /hpf (ref 0–2)

## 2019-08-05 ENCOUNTER — Other Ambulatory Visit
Admission: RE | Admit: 2019-08-05 | Discharge: 2019-08-05 | Disposition: A | Discharge status: Home / Self Care | Payer: 59 | Source: Ambulatory Visit | Attending: Gastroenterology | Admitting: Gastroenterology

## 2019-08-05 ENCOUNTER — Other Ambulatory Visit: Payer: Self-pay

## 2019-08-05 DIAGNOSIS — Z01812 Encounter for preprocedural laboratory examination: Secondary | ICD-10-CM | POA: Diagnosis not present

## 2019-08-05 DIAGNOSIS — Z8619 Personal history of other infectious and parasitic diseases: Secondary | ICD-10-CM | POA: Insufficient documentation

## 2019-08-05 DIAGNOSIS — R197 Diarrhea, unspecified: Secondary | ICD-10-CM | POA: Insufficient documentation

## 2019-08-05 DIAGNOSIS — Z20828 Contact with and (suspected) exposure to other viral communicable diseases: Secondary | ICD-10-CM | POA: Insufficient documentation

## 2019-08-05 LAB — C DIFFICILE QUICK SCREEN W PCR REFLEX
C Diff antigen: POSITIVE — AB
C Diff toxin: NEGATIVE

## 2019-08-05 LAB — CLOSTRIDIUM DIFFICILE BY PCR, REFLEXED: Toxigenic C. Difficile by PCR: POSITIVE — AB

## 2019-08-06 LAB — SARS CORONAVIRUS 2 (TAT 6-24 HRS): SARS Coronavirus 2: NEGATIVE

## 2019-08-09 ENCOUNTER — Encounter: Admission: RE | Payer: Self-pay | Source: Home / Self Care

## 2019-08-09 ENCOUNTER — Ambulatory Visit: Admission: RE | Admit: 2019-08-09 | Payer: 59 | Source: Home / Self Care | Admitting: Gastroenterology

## 2019-08-09 SURGERY — COLONOSCOPY WITH PROPOFOL
Anesthesia: General

## 2019-08-10 LAB — GI PATHOGEN PANEL BY PCR, STOOL
Adenovirus F 40/41: NOT DETECTED
Astrovirus: NOT DETECTED
C difficile toxin A/B: DETECTED — AB
Campylobacter by PCR: NOT DETECTED
Cryptosporidium by PCR: NOT DETECTED
Cyclospora cayetanensis: NOT DETECTED
E coli (ETEC) LT/ST: NOT DETECTED
E coli (STEC): NOT DETECTED
E coli 0157 by PCR: NOT DETECTED
Entamoeba histolytica: NOT DETECTED
Enteroaggregative E coli: NOT DETECTED
Enteropathogenic E coli: NOT DETECTED
G lamblia by PCR: NOT DETECTED
Norovirus GI/GII: NOT DETECTED
Plesiomonas shigelloides: NOT DETECTED
Rotavirus A by PCR: NOT DETECTED
Salmonella by PCR: NOT DETECTED
Sapovirus: NOT DETECTED
Shigella by PCR: NOT DETECTED
Vibrio cholerae: NOT DETECTED
Vibrio: NOT DETECTED
Yersinia enterocolitica: NOT DETECTED

## 2019-09-24 ENCOUNTER — Other Ambulatory Visit: Payer: Self-pay | Admitting: Obstetrics and Gynecology

## 2019-10-05 ENCOUNTER — Other Ambulatory Visit: Payer: Self-pay | Admitting: Adult Health

## 2019-10-05 MED ORDER — AZITHROMYCIN 250 MG PO TABS: ORAL_TABLET | ORAL | 0 refills | Status: AC

## 2019-10-05 NOTE — Progress Notes (Signed)
Sent RX for Zpak for sinusitis.

## 2019-10-21 ENCOUNTER — Other Ambulatory Visit: Payer: Self-pay | Admitting: Nurse Practitioner

## 2019-10-21 DIAGNOSIS — Z1231 Encounter for screening mammogram for malignant neoplasm of breast: Secondary | ICD-10-CM

## 2019-10-26 ENCOUNTER — Ambulatory Visit
Admission: RE | Admit: 2019-10-26 | Discharge: 2019-10-26 | Disposition: A | Discharge status: Home / Self Care | Payer: 59 | Source: Ambulatory Visit | Attending: Nurse Practitioner | Admitting: Nurse Practitioner

## 2019-10-26 DIAGNOSIS — Z1231 Encounter for screening mammogram for malignant neoplasm of breast: Secondary | ICD-10-CM | POA: Diagnosis not present

## 2019-10-26 NOTE — Progress Notes (Signed)
Negative mammogram

## 2020-01-28 ENCOUNTER — Other Ambulatory Visit: Payer: Self-pay

## 2020-01-28 ENCOUNTER — Ambulatory Visit (INDEPENDENT_AMBULATORY_CARE_PROVIDER_SITE_OTHER): Payer: Self-pay | Admitting: Urology

## 2020-01-28 VITALS — BP 112/77 | HR 97 | Ht 62.0 in | Wt 150.0 lb

## 2020-01-28 DIAGNOSIS — R350 Frequency of micturition: Secondary | ICD-10-CM

## 2020-01-28 LAB — URINALYSIS, COMPLETE
Bilirubin, UA: NEGATIVE
Glucose, UA: NEGATIVE
Ketones, UA: NEGATIVE
Leukocytes,UA: NEGATIVE
Nitrite, UA: NEGATIVE
Protein,UA: NEGATIVE
RBC, UA: NEGATIVE
Specific Gravity, UA: <1.005 — ABNORMAL LOW (ref 1.005–1.030)
Urobilinogen, Ur: 0.2 mg/dL (ref 0.2–1.0)
pH, UA: 5.5 (ref 5.0–7.5)

## 2020-01-28 LAB — MICROSCOPIC EXAMINATION
Bacteria, UA: NONE SEEN
RBC, Urine: NONE SEEN /hpf (ref 0–2)
WBC, UA: NONE SEEN /hpf (ref 0–5)

## 2020-01-28 LAB — BLADDER SCAN AMB NON-IMAGING: Scan Result: 60

## 2020-01-28 NOTE — Patient Instructions (Signed)

## 2020-01-28 NOTE — Progress Notes (Signed)
01/28/2020 10:58 AM   Stephanie Hess 01/29/1951 [id number]  Referring provider: Lavera Hess, Stephanie Hess,  Stephanie Hess [id number]  Chief Complaint  Patient presents with  . Urinary Frequency    HPI:  F/u - [name] referred over for pelvic pain. Since Jan 2020 she feels pain where she "pees". It is constant wether she is voiding or not. The pain does not improve with voiding. Pain is intermittent. She denies straining to void or pushing to have the urine flow. She drinks a lot of water and can void about every 1-2 hours. No gross hematuria. She has no urgency. She tried abx and they helped the pain but the pain returned. She also developed c. Diff on one occasion. No constipation. No NG risk. She wears a pad due to small leak with a sneeze or cough which has been going on for 10+ years. Bladder scan was 100 ml. No GU history or surgery. She tried cranberry. UA with rare bacteria and 11-30 wbc.  She has had a lot of GI issues recently and was seen by GI.Normal pelvic exam with her Gyn.A CT scan of the abdomen and pelvis was done 04/28/2019 which revealed a normal urinary tract. The bladder was moderately distended on the exam but not thick-walled. She had a normal urinalysis and a creatinine of 0.7.  Cystoscopy was benign Sep 2020.   Stephanie Hess returns and we encouraged her to increase water intake, try probiotics and d-mannose. She has been doing well. No dysuria or gross hematuria. PVR 60 ml today. No frequency or urgency.    PMH: Past Medical History:  Diagnosis Date  . Cancer (Stephanie Hess)   . COPD (chronic obstructive pulmonary disease) (Stephanie Hess)   . Diverticula of colon   . GERD (gastroesophageal reflux disease)   . Hyperlipidemia   . Renal insufficiency    2009 or 2010    Surgical History: Past Surgical History:  Procedure Laterality Date  . APPENDECTOMY    . CESAREAN SECTION    . ECTOPIC PREGNANCY SURGERY      Home Medications:  Allergies as of 01/28/2020    Reactions   Penicillins Nausea And Vomiting, Other (See Comments)   Has patient had a PCN reaction causing immediate rash, facial/tongue/throat swelling, SOB or lightheadedness with hypotension: No Has patient had a PCN reaction causing severe rash involving mucus membranes or skin necrosis: No Has patient had a PCN reaction that required hospitalization: No Has patient had a PCN reaction occurring within the last 10 years: Yes If all of the above answers are "NO", then may proceed with Cephalosporin use.   Shellfish Allergy Nausea And Vomiting   Sulfa Antibiotics Rash, Other (See Comments)   Other reaction(s): Unknown   Biaxin [clarithromycin] Rash   Amoxicillin-pot Clavulanate Nausea And Vomiting   Iodinated Diagnostic Agents Other (See Comments)   acute renal insufficiency acute renal insufficiency   Atorvastatin Rash   Macrobid [nitrofurantoin] Rash      Medication List       Accurate as of January 28, 2020 10:58 AM. If you have any questions, ask your nurse or doctor.        aspirin EC 81 MG tablet Take 1 tablet (81 mg total) by mouth daily.   budesonide-formoterol 160-4.5 MCG/ACT inhaler Commonly known as: SYMBICORT Inhale 2 puffs into the lungs 2 (two) times daily as needed (shortness of breath).   ciprofloxacin 500 MG tablet Commonly known as: CIPRO TAKE 1 TABLET (500 MG TOTAL) BY MOUTH  TWO (2) TIMES A DAY FOR 10 DAYS.   clorazepate 7.5 MG tablet Commonly known as: TRANXENE TAKE 1 TABLET BY MOUTH AT BEDTIME AS NEEDED FOR ANXIETY   ergocalciferol 1.25 MG (50000 UT) capsule Commonly known as: VITAMIN D2 ergocalciferol (vitamin D2) 1,250 mcg (50,000 unit) capsule   HYDROcodone-acetaminophen 5-325 MG tablet Commonly known as: NORCO/VICODIN TAKE 1 TABLET BY MOUTH EVERY SIX (6) HOURS AS NEEDED FOR PAIN FOR UP TO 5 DAYS.   ibuprofen 800 MG tablet Commonly known as: ADVIL Take 1 tablet (800 mg total) by mouth every 8 (eight) hours as needed.   metroNIDAZOLE 500 MG  tablet Commonly known as: FLAGYL TAKE 1 TABLET (500 MG TOTAL) BY MOUTH THREE (3) TIMES A DAY FOR 10 DAYS.   nitroGLYCERIN 0.4 MG SL tablet Commonly known as: NITROSTAT Place 1 tablet (0.4 mg total) under the tongue every 5 (five) minutes as needed.   omeprazole 20 MG capsule Commonly known as: PRILOSEC Take 20 mg by mouth daily.   OXYGEN Inhale into the lungs. 2 litre at night       Allergies:  Allergies  Allergen Reactions  . Penicillins Nausea And Vomiting and Other (See Comments)    Has patient had a PCN reaction causing immediate rash, facial/tongue/throat swelling, SOB or lightheadedness with hypotension: No Has patient had a PCN reaction causing severe rash involving mucus membranes or skin necrosis: No Has patient had a PCN reaction that required hospitalization: No Has patient had a PCN reaction occurring within the last 10 years: Yes If all of the above answers are "NO", then may proceed with Cephalosporin use.   . Shellfish Allergy Nausea And Vomiting  . Sulfa Antibiotics Rash and Other (See Comments)    Other reaction(s): Unknown  . Biaxin [Clarithromycin] Rash  . Amoxicillin-Pot Clavulanate Nausea And Vomiting  . Iodinated Diagnostic Agents Other (See Comments)    acute renal insufficiency acute renal insufficiency  . Atorvastatin Rash  . Macrobid [Nitrofurantoin] Rash    Family History: Family History  Problem Relation Age of Onset  . Breast cancer Paternal Aunt   . Heart attack Mother     Social History:  reports that she has been smoking cigarettes. She has a 25.00 pack-year smoking history. She has never used smokeless tobacco. She reports current alcohol use. She reports that she does not use drugs.   Physical Exam: BP 112/77   Pulse 97   Ht 5\' 2"  (1.575 m)   Wt 150 lb (68 kg)   BMI 27.44 kg/m   Constitutional:  Alert and oriented, No acute distress. HEENT: [place] AT, moist mucus membranes.  Trachea midline, no masses. Cardiovascular: No  clubbing, cyanosis, or edema. Respiratory: Normal respiratory effort, no increased work of breathing. GI: Abdomen is soft, nontender, nondistended, no abdominal masses GU: No CVA tenderness Skin: No rashes, bruises or suspicious lesions. Neurologic: Grossly intact, no focal deficits, moving all 4 extremities. Psychiatric: Normal mood and affect.  Laboratory Data: Lab Results  Component Value Date   WBC 14.7 (H) 07/16/2018   HGB 13.7 07/16/2018   HCT 39.4 07/16/2018   MCV 91.4 07/16/2018   PLT 277 07/16/2018    Lab Results  Component Value Date   CREATININE 0.69 08/12/2018    No results found for: PSA  No results found for: TESTOSTERONE  Lab Results  Component Value Date   HGBA1C 6.1 (H) 08/12/2018    Urinalysis    Component Value Date/Time   COLORURINE COLORLESS (A) 03/01/2016 1447   APPEARANCEUR  Clear 07/21/2019 1325   LABSPEC 1.001 (L) 03/01/2016 1447   PHURINE 6.0 03/01/2016 1447   GLUCOSEU Negative 07/21/2019 1325   HGBUR NEGATIVE 03/01/2016 1447   BILIRUBINUR Negative 07/21/2019 1325   KETONESUR NEGATIVE 03/01/2016 1447   PROTEINUR Negative 07/21/2019 1325   PROTEINUR NEGATIVE 03/01/2016 1447   UROBILINOGEN 0.2 12/14/2018 1619   NITRITE Negative 07/21/2019 1325   NITRITE NEGATIVE 03/01/2016 1447   LEUKOCYTESUR Negative 07/21/2019 1325    Lab Results  Component Value Date   LABMICR See below: 07/21/2019   WBCUA 0-5 07/21/2019   LABEPIT 0-10 07/21/2019   MUCUS Present 03/03/2019   BACTERIA None seen 07/21/2019    Pertinent Imaging: n/a No results found for this or any previous visit. No results found for this or any previous visit. No results found for this or any previous visit. No results found for this or any previous visit. No results found for this or any previous visit. No results found for this or any previous visit. No results found for this or any previous visit. No results found for this or any previous visit.  Assessment & Plan:     1. Frequency of micturition Improved and UA clear today. See in 1 year or sooner if issues. - Urinalysis, Complete - Bladder Scan (Post Void Residual) in office   No follow-ups on file.  Festus Aloe, MD  [facility] [address], [place], [place] [address] [phone]

## 2020-02-01 ENCOUNTER — Ambulatory Visit: Payer: BC Managed Care – PPO | Admitting: Nurse Practitioner

## 2020-02-01 ENCOUNTER — Encounter: Payer: Self-pay | Admitting: Nurse Practitioner

## 2020-02-01 VITALS — Ht 61.0 in | Wt 150.0 lb

## 2020-02-01 DIAGNOSIS — B0223 Postherpetic polyneuropathy: Secondary | ICD-10-CM | POA: Diagnosis not present

## 2020-02-01 DIAGNOSIS — M79622 Pain in left upper arm: Secondary | ICD-10-CM

## 2020-02-01 MED ORDER — PREDNISONE 10 MG (21) PO TBPK: ORAL_TABLET | ORAL | 0 refills | Status: DC

## 2020-02-01 MED ORDER — HYDROCODONE-ACETAMINOPHEN 5-325 MG PO TABS: 1.0000 | ORAL_TABLET | Freq: Four times a day (QID) | ORAL | 0 refills | Status: DC | PRN

## 2020-02-01 MED ORDER — VALACYCLOVIR HCL 1 G PO TABS: 1000.0000 mg | ORAL_TABLET | Freq: Two times a day (BID) | ORAL | 0 refills | Status: DC

## 2020-02-01 NOTE — Progress Notes (Signed)
[facility] [place], [place] [address]  Internal MEDICINE  Telephone Visit  Patient Name: Stephanie Hess  [id number]  [id number]  Date of Service: 02/13/2020  I connected with the patient at 5:24pm by webcam and verified the patients identity using two identifiers.   I discussed the limitations, risks, security and privacy concerns of performing an evaluation and management service by webcam and the availability of in person appointments. I also discussed with the patient that there may be a patient responsible charge related to the service.  The patient expressed understanding and agrees to proceed.    Chief Complaint  Patient presents with  . Telephone Assessment  . Telephone Screen  . Hospitalization Follow-up    SHINGLES ,PAIN IN LEFT ARM   . Quality Metric Gaps    PT NEED AWV    The patient has been contacted via webcam for follow up visit due to concerns for spread of novel coronavirus. The patient presents for acute visit. Was recently seen in Urgent Care and diagnosed with shingles. Was discharged with prescription for gabapentin and no other prescriptions were given. She has a few pustular lesions on her left wrist and a few on her left shoulder. Started about three weeks ago. Felt like pins and needles at times and other days she would have a tingling type feeling in her arm. Pain in upper arm can go into the shoulder, limiting the range of motion and strength she has in her left arm.       Current Medication: Outpatient Encounter Medications as of 02/01/2020  Medication Sig  . aspirin EC 81 MG tablet Take 1 tablet (81 mg total) by mouth daily.  . budesonide-formoterol (SYMBICORT) 160-4.5 MCG/ACT inhaler Inhale 2 puffs into the lungs 2 (two) times daily as needed (shortness of breath).   . ergocalciferol (VITAMIN D2) 1.25 MG (50000 UT) capsule ergocalciferol (vitamin D2) 1,250 mcg (50,000 unit) capsule  . gabapentin (NEURONTIN) 300 MG capsule Take  by mouth.  Marland Kitchen ibuprofen (ADVIL,MOTRIN) 800 MG tablet Take 1 tablet (800 mg total) by mouth every 8 (eight) hours as needed.  Marland Kitchen omeprazole (PRILOSEC) 20 MG capsule Take 20 mg by mouth daily.  . OXYGEN Inhale into the lungs. 2 litre at night  . HYDROcodone-acetaminophen (NORCO/VICODIN) 5-325 MG tablet Take 1 tablet by mouth every 6 (six) hours as needed for moderate pain.  . nitroGLYCERIN (NITROSTAT) 0.4 MG SL tablet Place 1 tablet (0.4 mg total) under the tongue every 5 (five) minutes as needed. (Patient not taking: Reported on 02/01/2020)  . predniSONE (STERAPRED UNI-PAK 21 TAB) 10 MG (21) TBPK tablet 6 day taper - take by mouth as directed for 6 days  . valACYclovir (VALTREX) 1000 MG tablet Take 1 tablet (1,000 mg total) by mouth 2 (two) times daily.  . [DISCONTINUED] ciprofloxacin (CIPRO) 500 MG tablet TAKE 1 TABLET (500 MG TOTAL) BY MOUTH TWO (2) TIMES A DAY FOR 10 DAYS.  . [DISCONTINUED] clorazepate (TRANXENE) 7.5 MG tablet TAKE 1 TABLET BY MOUTH AT BEDTIME AS NEEDED FOR ANXIETY (Patient not taking: Reported on 01/28/2020)  . [DISCONTINUED] HYDROcodone-acetaminophen (NORCO/VICODIN) 5-325 MG tablet TAKE 1 TABLET BY MOUTH EVERY SIX (6) HOURS AS NEEDED FOR PAIN FOR UP TO 5 DAYS.  . [DISCONTINUED] metroNIDAZOLE (FLAGYL) 500 MG tablet TAKE 1 TABLET (500 MG TOTAL) BY MOUTH THREE (3) TIMES A DAY FOR 10 DAYS.   No facility-administered encounter medications on file as of 02/01/2020.    Surgical History: Past Surgical History:  Procedure Laterality Date  . APPENDECTOMY    . CESAREAN SECTION    . ECTOPIC PREGNANCY SURGERY      Medical History: Past Medical History:  Diagnosis Date  . Cancer ([place])   . COPD (chronic obstructive pulmonary disease) ([place])   . Diverticula of colon   . GERD (gastroesophageal reflux disease)   . Hyperlipidemia   . Renal insufficiency    2009 or 2010    Family History: Family History  Problem Relation Age of Onset  . Breast cancer Paternal Aunt   . Heart attack  Mother     Social History   Socioeconomic History  . Marital status: Divorced    Spouse name: Not on file  . Number of children: Not on file  . Years of education: Not on file  . Highest education level: Not on file  Occupational History  . Not on file  Tobacco Use  . Smoking status: Current Every Day Smoker    Packs/day: 0.50    Years: 50.00    Pack years: 25.00    Types: Cigarettes  . Smokeless tobacco: Never Used  Substance and Sexual Activity  . Alcohol use: Yes    Comment: social  . Drug use: No  . Sexual activity: Never  Other Topics Concern  . Not on file  Social History Narrative  . Not on file   Social Determinants of Health   Financial Resource Strain:   . Difficulty of Paying Living Expenses:   Food Insecurity:   . Worried About Charity fundraiser in the Last Year:   . Arboriculturist in the Last Year:   Transportation Needs:   . Film/video editor (Medical):   Marland Kitchen Lack of Transportation (Non-Medical):   Physical Activity:   . Days of Exercise per Week:   . Minutes of Exercise per Session:   Stress:   . Feeling of Stress :   Social Connections:   . Frequency of Communication with Friends and Family:   . Frequency of Social Gatherings with Friends and Family:   . Attends Religious Services:   . Active Member of Clubs or Organizations:   . Attends Archivist Meetings:   Marland Kitchen Marital Status:   Intimate Partner Violence:   . Fear of Current or Ex-Partner:   . Emotionally Abused:   Marland Kitchen Physically Abused:   . Sexually Abused:       Review of Systems  Constitutional: Positive for fatigue. Negative for chills and unexpected weight change.  HENT: Negative for congestion, postnasal drip, rhinorrhea, sneezing and sore throat.   Respiratory: Negative for cough, chest tightness, shortness of breath and wheezing.   Cardiovascular: Negative for chest pain and palpitations.  Gastrointestinal: Negative for abdominal pain, constipation, diarrhea,  nausea and vomiting.  Musculoskeletal: Positive for arthralgias and myalgias. Negative for back pain, joint swelling and neck pain.       Left upper arm and left shoulder.   Skin: Positive for rash.       Few pustular lesions on the left arm which are painful.   Allergic/Immunologic: Negative for environmental allergies.  Neurological: Negative for dizziness, tremors, numbness and headaches.  Hematological: Negative for adenopathy. Does not bruise/bleed easily.  Psychiatric/Behavioral: Negative for behavioral problems (Depression), sleep disturbance and suicidal ideas. The patient is not nervous/anxious.    Today's Vitals   02/01/20 1436  Weight: 150 lb (68 kg)  Height: 5\' 1"  (1.549 m)   Body mass index is 28.34 kg/m.  Observation/Objective:   The patient is alert and oriented. She is pleasant and answers all questions appropriately. Breathing is non-labored. She is in no acute distress at this time. Few, widespread pustular lesions on left lower arm present. There is some inflammation around the pustular lesions. Skin is currently intact.    Assessment/Plan: 1. Shingles (herpes zoster) polyneuropathy Start valacyclovir 1000mg  twice daily for the next 5 days. Add prednisone taper. Take as directed for 6 days. May take hydrocodone/APAP 5/325mg  tablets up to four times daily as needed for severe pain. Short term prescription for #24 tablets sent to her pharmacy. Continue previously prescribed gabapentin.  - predniSONE (STERAPRED UNI-PAK 21 TAB) 10 MG (21) TBPK tablet; 6 day taper - take by mouth as directed for 6 days  Dispense: 21 tablet; Refill: 0 - HYDROcodone-acetaminophen (NORCO/VICODIN) 5-325 MG tablet; Take 1 tablet by mouth every 6 (six) hours as needed for moderate pain.  Dispense: 24 tablet; Refill: 0 - valACYclovir (VALTREX) 1000 MG tablet; Take 1 tablet (1,000 mg total) by mouth 2 (two) times daily.  Dispense: 10 tablet; Refill: 0  2. Left upper arm pain Add prednisone  taper. Take as directed for 6 days. May take hydrocodone/APAP 5/325mg  tablets up to four times daily as needed for severe pain. Short term prescription for #24 tablets sent to her pharmacy. Continue previously prescribed gabapentin.  - predniSONE (STERAPRED UNI-PAK 21 TAB) 10 MG (21) TBPK tablet; 6 day taper - take by mouth as directed for 6 days  Dispense: 21 tablet; Refill: 0 - HYDROcodone-acetaminophen (NORCO/VICODIN) 5-325 MG tablet; Take 1 tablet by mouth every 6 (six) hours as needed for moderate pain.  Dispense: 24 tablet; Refill: 0  General Counseling: Eloyce verbalizes understanding of the findings of today's phone visit and agrees with plan of treatment. I have discussed any further diagnostic evaluation that may be needed or ordered today. We also reviewed her medications today. she has been encouraged to call the office with any questions or concerns that should arise related to todays visit.  This patient was seen by [place] with Dr Lavera Guise as a part of collaborative care agreement  Meds ordered this encounter  Medications  . predniSONE (STERAPRED UNI-PAK 21 TAB) 10 MG (21) TBPK tablet    Sig: 6 day taper - take by mouth as directed for 6 days    Dispense:  21 tablet    Refill:  0    Order Specific Question:   Supervising Provider    Answer:   Lavera Guise [place]  . HYDROcodone-acetaminophen (NORCO/VICODIN) 5-325 MG tablet    Sig: Take 1 tablet by mouth every 6 (six) hours as needed for moderate pain.    Dispense:  24 tablet    Refill:  0    Order Specific Question:   Supervising Provider    Answer:   Lavera Guise [id number]  . valACYclovir (VALTREX) 1000 MG tablet    Sig: Take 1 tablet (1,000 mg total) by mouth 2 (two) times daily.    Dispense:  10 tablet    Refill:  0    Order Specific Question:   Supervising Provider    Answer:   Lavera Guise [id number]    Time spent: 56 Minutes    Dr Lavera Guise Internal medicine

## 2020-02-13 DIAGNOSIS — M79622 Pain in left upper arm: Secondary | ICD-10-CM | POA: Insufficient documentation

## 2020-02-13 DIAGNOSIS — B0223 Postherpetic polyneuropathy: Secondary | ICD-10-CM | POA: Insufficient documentation

## 2020-03-01 ENCOUNTER — Other Ambulatory Visit: Payer: Self-pay

## 2020-03-01 MED ORDER — BUDESONIDE-FORMOTEROL FUMARATE 160-4.5 MCG/ACT IN AERO: 2.0000 | INHALATION_SPRAY | Freq: Two times a day (BID) | RESPIRATORY_TRACT | 1 refills | Status: DC | PRN

## 2020-03-09 ENCOUNTER — Other Ambulatory Visit: Payer: Self-pay

## 2020-03-09 ENCOUNTER — Encounter: Payer: Self-pay | Admitting: Nurse Practitioner

## 2020-03-09 ENCOUNTER — Ambulatory Visit: Payer: BC Managed Care – PPO | Admitting: Nurse Practitioner

## 2020-03-09 VITALS — BP 126/71 | HR 103 | Temp 97.5°F | Resp 16 | Ht 61.0 in | Wt 151.8 lb

## 2020-03-09 DIAGNOSIS — M79622 Pain in left upper arm: Secondary | ICD-10-CM

## 2020-03-09 DIAGNOSIS — B0223 Postherpetic polyneuropathy: Secondary | ICD-10-CM

## 2020-03-09 MED ORDER — GABAPENTIN 300 MG PO CAPS: 300.0000 mg | ORAL_CAPSULE | Freq: Every day | ORAL | 2 refills | Status: DC

## 2020-03-09 NOTE — Progress Notes (Signed)
[facility] [place], [place] [address]  Internal MEDICINE  Office Visit Note  Patient Name: Stephanie Hess  [id number]  [id number]  Date of Service: 03/22/2020   Pt is here for a sick visit.  Chief Complaint  Patient presents with  . Arm Problem    tingling on arm elbow down to pinky   . Ear Problem    left ear, hissing sound, getting louder      The patient is here for sick visit. She has noted some ringing in her right ear. She states that she hears a "hissing" type noise in the ear. Has gradually become worse and louder. Does not hurt or feel infected.  She does have some tigling in her left arm. This is intermittent. Has been present since she had shingles along the left inner/radial aspect of the arm. She had been on gabapentin for pain/neuropathy associated with the shingles, but this had run out some time ago. She has full ROM and strength in the left arm.        Current Medication:  Outpatient Encounter Medications as of 03/09/2020  Medication Sig  . aspirin EC 81 MG tablet Take 1 tablet (81 mg total) by mouth daily.  . budesonide-formoterol (SYMBICORT) 160-4.5 MCG/ACT inhaler Inhale 2 puffs into the lungs 2 (two) times daily as needed (shortness of breath).  . ergocalciferol (VITAMIN D2) 1.25 MG (50000 UT) capsule ergocalciferol (vitamin D2) 1,250 mcg (50,000 unit) capsule  . gabapentin (NEURONTIN) 300 MG capsule Take 1 capsule (300 mg total) by mouth at bedtime.  Marland Kitchen HYDROcodone-acetaminophen (NORCO/VICODIN) 5-325 MG tablet Take 1 tablet by mouth every 6 (six) hours as needed for moderate pain.  Marland Kitchen ibuprofen (ADVIL,MOTRIN) 800 MG tablet Take 1 tablet (800 mg total) by mouth every 8 (eight) hours as needed.  . nitroGLYCERIN (NITROSTAT) 0.4 MG SL tablet Place 1 tablet (0.4 mg total) under the tongue every 5 (five) minutes as needed.  Marland Kitchen omeprazole (PRILOSEC) 20 MG capsule Take 20 mg by mouth daily.  . OXYGEN Inhale into the lungs. 2 litre at  night  . [DISCONTINUED] gabapentin (NEURONTIN) 300 MG capsule Take by mouth.  . [DISCONTINUED] predniSONE (STERAPRED UNI-PAK 21 TAB) 10 MG (21) TBPK tablet 6 day taper - take by mouth as directed for 6 days (Patient not taking: Reported on 03/09/2020)  . [DISCONTINUED] valACYclovir (VALTREX) 1000 MG tablet Take 1 tablet (1,000 mg total) by mouth 2 (two) times daily. (Patient not taking: Reported on 03/09/2020)   No facility-administered encounter medications on file as of 03/09/2020.      Medical History: Past Medical History:  Diagnosis Date  . Cancer ([place])   . COPD (chronic obstructive pulmonary disease) ([place])   . Diverticula of colon   . GERD (gastroesophageal reflux disease)   . Hyperlipidemia   . Renal insufficiency    2009 or 2010     Today's Vitals   03/09/20 1129  BP: 126/71  Pulse: (!) 103  Resp: 16  Temp: (!) 97.5 F (36.4 C)  SpO2: 98%  Weight: 151 lb 12.8 oz (68.9 kg)  Height: 5\' 1"  (1.549 m)   Body mass index is 28.68 kg/m.  Review of Systems  Constitutional: Negative for activity change, chills, fatigue and unexpected weight change.  HENT: Negative for congestion, postnasal drip, rhinorrhea, sneezing and sore throat.   Respiratory: Negative for cough, chest tightness and shortness of breath.   Cardiovascular: Negative for chest pain and palpitations.  Gastrointestinal: Negative for abdominal pain,  constipation, diarrhea, nausea and vomiting.  Endocrine: Negative for cold intolerance, heat intolerance, polydipsia and polyuria.  Musculoskeletal: Positive for myalgias. Negative for arthralgias, back pain, joint swelling and neck pain.       Left arm pain and weakness   Skin: Negative for rash.  Allergic/Immunologic: Negative for environmental allergies.  Neurological: Positive for numbness. Negative for tremors.       Tingling and burning sensation along the inner aspect of the left arm from the armpit to the inner elbow and sometimes, down to the left wrist.    Hematological: Negative for adenopathy. Does not bruise/bleed easily.  Psychiatric/Behavioral: Negative for behavioral problems (Depression), sleep disturbance and suicidal ideas. The patient is not nervous/anxious.     Physical Exam Vitals and nursing note reviewed.  Constitutional:      General: She is not in acute distress.    Appearance: Normal appearance. She is well-developed. She is not diaphoretic.  HENT:     Head: Normocephalic and atraumatic.     Nose: Nose normal.     Mouth/Throat:     Pharynx: No oropharyngeal exudate.  Eyes:     Pupils: Pupils are equal, round, and reactive to light.  Neck:     Thyroid: No thyromegaly.     Vascular: No JVD.     Trachea: No tracheal deviation.  Cardiovascular:     Rate and Rhythm: Normal rate and regular rhythm.     Heart sounds: Normal heart sounds. No murmur. No friction rub. No gallop.   Pulmonary:     Effort: Pulmonary effort is normal. No respiratory distress.     Breath sounds: Normal breath sounds. No wheezing or rales.  Chest:     Chest wall: No tenderness.  Abdominal:     Palpations: Abdomen is soft.  Musculoskeletal:        General: Normal range of motion.     Cervical back: Normal range of motion and neck supple.     Comments: There is full ROM and strength present in left arm.   Lymphadenopathy:     Cervical: No cervical adenopathy.  Skin:    General: Skin is warm and dry.     Findings: Rash present.     Comments: No lesions or rash present on the left inner arm.   Neurological:     General: No focal deficit present.     Mental Status: She is alert and oriented to person, place, and time.     Cranial Nerves: No cranial nerve deficit.  Psychiatric:        Mood and Affect: Mood normal.        Behavior: Behavior normal.        Thought Content: Thought content normal.        Judgment: Judgment normal.   Assessment/Plan:  1. Shingles (herpes zoster) polyneuropathy Add gabapentin 300mg  capsules, as previously  prescribed. Should be taken at bedtime as needed.  - gabapentin (NEURONTIN) 300 MG capsule; Take 1 capsule (300 mg total) by mouth at bedtime.  Dispense: 30 capsule; Refill: 2  2. Left upper arm pain Gabapentin should be taken as prescribed   General Counseling: Solymar verbalizes understanding of the findings of todays visit and agrees with plan of treatment. I have discussed any further diagnostic evaluation that may be needed or ordered today. We also reviewed her medications today. she has been encouraged to call the office with any questions or concerns that should arise related to todays visit.    Counseling:  This patient was seen by [place] with Dr Lavera Guise as a part of collaborative care agreement  Meds ordered this encounter  Medications  . gabapentin (NEURONTIN) 300 MG capsule    Sig: Take 1 capsule (300 mg total) by mouth at bedtime.    Dispense:  30 capsule    Refill:  2    Order Specific Question:   Supervising Provider    Answer:   Lavera Guise [id number]    Time spent: 20 Minutes

## 2020-03-28 ENCOUNTER — Encounter: Payer: Self-pay | Admitting: Internal Medicine

## 2020-03-28 ENCOUNTER — Ambulatory Visit: Payer: BC Managed Care – PPO | Admitting: Internal Medicine

## 2020-03-28 ENCOUNTER — Other Ambulatory Visit: Payer: Self-pay

## 2020-03-28 VITALS — BP 133/81 | HR 101 | Temp 97.5°F | Resp 16 | Ht 61.0 in | Wt 149.2 lb

## 2020-03-28 DIAGNOSIS — J4520 Mild intermittent asthma, uncomplicated: Secondary | ICD-10-CM

## 2020-03-28 DIAGNOSIS — R0602 Shortness of breath: Secondary | ICD-10-CM

## 2020-03-28 DIAGNOSIS — B0223 Postherpetic polyneuropathy: Secondary | ICD-10-CM | POA: Diagnosis not present

## 2020-03-28 DIAGNOSIS — F17200 Nicotine dependence, unspecified, uncomplicated: Secondary | ICD-10-CM

## 2020-03-28 DIAGNOSIS — I700 Atherosclerosis of aorta: Secondary | ICD-10-CM

## 2020-03-28 NOTE — Progress Notes (Signed)
[facility] [place], [place] [address]  Pulmonary Sleep Medicine   Office Visit Note  Patient Name: Stephanie Hess DOB: 1951-01-05 MRN [id number]  Date of Service: 03/28/2020  Complaints/HPI: Pt is here for pulmonary follow up.  She has a history of Chronic obstructive asthma.  She continues to smoke 1/2 PPD of cigarettes. She reports ongoing cough that is mildly productive. She reports some intermittent congestion.  Right now she is dealing with a shingles outbreak.      ROS  General: (-) fever, (-) chills, (-) night sweats, (-) weakness Skin: (-) rashes, (-) itching,. Eyes: (-) visual changes, (-) redness, (-) itching. Nose and Sinuses: (-) nasal stuffiness or itchiness, (-) postnasal drip, (-) nosebleeds, (-) sinus trouble. Mouth and Throat: (-) sore throat, (-) hoarseness. Neck: (-) swollen glands, (-) enlarged thyroid, (-) neck pain. Respiratory: - cough, (-) bloody sputum, - shortness of breath, - wheezing. Cardiovascular: - ankle swelling, (-) chest pain. Lymphatic: (-) lymph node enlargement. Neurologic: (-) numbness, (-) tingling. Psychiatric: (-) anxiety, (-) depression   Current Medication: Outpatient Encounter Medications as of 03/28/2020  Medication Sig  . aspirin EC 81 MG tablet Take 1 tablet (81 mg total) by mouth daily.  . budesonide-formoterol (SYMBICORT) 160-4.5 MCG/ACT inhaler Inhale 2 puffs into the lungs 2 (two) times daily as needed (shortness of breath).  . ergocalciferol (VITAMIN D2) 1.25 MG (50000 UT) capsule ergocalciferol (vitamin D2) 1,250 mcg (50,000 unit) capsule  . gabapentin (NEURONTIN) 300 MG capsule Take 1 capsule (300 mg total) by mouth at bedtime.  Marland Kitchen HYDROcodone-acetaminophen (NORCO/VICODIN) 5-325 MG tablet Take 1 tablet by mouth every 6 (six) hours as needed for moderate pain.  Marland Kitchen ibuprofen (ADVIL,MOTRIN) 800 MG tablet Take 1 tablet (800 mg total) by mouth every 8 (eight) hours as needed.  . nitroGLYCERIN  (NITROSTAT) 0.4 MG SL tablet Place 1 tablet (0.4 mg total) under the tongue every 5 (five) minutes as needed.  Marland Kitchen omeprazole (PRILOSEC) 20 MG capsule Take 20 mg by mouth daily.  . OXYGEN Inhale into the lungs. 2 litre at night   No facility-administered encounter medications on file as of 03/28/2020.    Surgical History: Past Surgical History:  Procedure Laterality Date  . APPENDECTOMY    . CESAREAN SECTION    . ECTOPIC PREGNANCY SURGERY      Medical History: Past Medical History:  Diagnosis Date  . Cancer ([place])   . COPD (chronic obstructive pulmonary disease) ([place])   . Diverticula of colon   . GERD (gastroesophageal reflux disease)   . Hyperlipidemia   . Renal insufficiency    2009 or 2010    Family History: Family History  Problem Relation Age of Onset  . Breast cancer Paternal Aunt   . Heart attack Mother     Social History: Social History   Socioeconomic History  . Marital status: Divorced    Spouse name: Not on file  . Number of children: Not on file  . Years of education: Not on file  . Highest education level: Not on file  Occupational History  . Not on file  Tobacco Use  . Smoking status: Current Every Day Smoker    Packs/day: 0.50    Years: 50.00    Pack years: 25.00    Types: Cigarettes  . Smokeless tobacco: Never Used  Substance and Sexual Activity  . Alcohol use: Yes    Comment: social  . Drug use: No  . Sexual activity: Never  Other Topics Concern  .  Not on file  Social History Narrative  . Not on file   Social Determinants of Health   Financial Resource Strain:   . Difficulty of Paying Living Expenses:   Food Insecurity:   . Worried About Charity fundraiser in the Last Year:   . Arboriculturist in the Last Year:   Transportation Needs:   . Film/video editor (Medical):   Marland Kitchen Lack of Transportation (Non-Medical):   Physical Activity:   . Days of Exercise per Week:   . Minutes of Exercise per Session:   Stress:   . Feeling of  Stress :   Social Connections:   . Frequency of Communication with Friends and Family:   . Frequency of Social Gatherings with Friends and Family:   . Attends Religious Services:   . Active Member of Clubs or Organizations:   . Attends Archivist Meetings:   Marland Kitchen Marital Status:   Intimate Partner Violence:   . Fear of Current or Ex-Partner:   . Emotionally Abused:   Marland Kitchen Physically Abused:   . Sexually Abused:     Vital Signs: Blood pressure 133/81, pulse (!) 101, temperature (!) 97.5 F (36.4 C), resp. rate 16, height 5\' 1"  (1.549 m), weight 149 lb 3.2 oz (67.7 kg), SpO2 94 %.  Examination: General Appearance: The patient is well-developed, well-nourished, and in no distress. Skin: Gross inspection of skin unremarkable. Head: normocephalic, no gross deformities. Eyes: no gross deformities noted. ENT: ears appear grossly normal no exudates. Neck: Supple. No thyromegaly. No LAD. Respiratory: clear bilaterally. Cardiovascular: Normal S1 and S2 without murmur or rub. Extremities: No cyanosis. pulses are equal. Neurologic: Alert and oriented. No involuntary movements.  LABS: Recent Results (from the past 2160 hour(s))  Urinalysis, Complete     Status: Abnormal   Collection Time: 01/28/20 10:31 AM  Result Value Ref Range   Specific Gravity, UA <1.005 (L) 1.005 - 1.030   pH, UA 5.5 5.0 - 7.5   Color, UA Yellow Yellow   Appearance Ur Clear Clear   Leukocytes,UA Negative Negative   Protein,UA Negative Negative/Trace   Glucose, UA Negative Negative   Ketones, UA Negative Negative   RBC, UA Negative Negative   Bilirubin, UA Negative Negative   Urobilinogen, Ur 0.2 0.2 - 1.0 mg/dL   Nitrite, UA Negative Negative   Microscopic Examination See below:   Microscopic Examination     Status: None   Collection Time: 01/28/20 10:31 AM   URINE  Result Value Ref Range   WBC, UA None seen 0 - 5 /hpf   RBC None seen 0 - 2 /hpf   Epithelial Cells (non renal) 0-10 0 - 10 /hpf    Bacteria, UA None seen None seen/Few  Bladder Scan (Post Void Residual) in office     Status: None   Collection Time: 01/28/20 10:46 AM  Result Value Ref Range   Scan Result 60     Radiology: MM 3D SCREEN BREAST BILATERAL  Result Date: 10/26/2019 CLINICAL DATA:  Screening. EXAM: DIGITAL SCREENING BILATERAL MAMMOGRAM WITH TOMO AND CAD COMPARISON:  Previous exam(s). ACR Breast Density Category b: There are scattered areas of fibroglandular density. FINDINGS: There are no findings suspicious for malignancy. Images were processed with CAD. IMPRESSION: No mammographic evidence of malignancy. A result letter of this screening mammogram will be mailed directly to the patient. RECOMMENDATION: Screening mammogram in one year. (Code:SM-B-01Y) BI-RADS CATEGORY  1: Negative. Electronically Signed   By: Ammie Ferrier M.D.  On: 10/26/2019 16:45    No results found.  No results found.    Assessment and Plan: Patient Active Problem List   Diagnosis Date Noted  . 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 Left upper arm pain 02/13/2020  . Acute upper respiratory infection 12/20/2018  . Sore throat 12/20/2018  . Fever and chills 12/20/2018  . Hemoptysis 12/20/2018  . Urinary tract infection without hematuria 11/25/2018  . Dysuria 11/25/2018  . Respiratory distress 07/16/2018  . Sternoclavicular joint pain 04/01/2018  . Bronchiectasis with acute lower respiratory infection ([place]) 03/03/2018  . Nicotine abuse 03/03/2018  . Mild intermittent asthma without complication [id number]  . Pulmonary nodule 03/03/2018  . Gastroesophageal reflux disease without esophagitis 03/03/2018  . Hyperlipidemia 05/15/2017  . Vitamin D deficiency 05/15/2017    1. Mild intermittent asthma without complication Discussed ongoing cough, and issues.  - DG Chest 2 View; Future  2. Nicotine dependence with current use Smoking cessation counseling: 1. Pt acknowledges the risks of long term smoking, she  will try to quite smoking. 2. Options for different medications including nicotine products, chewing gum, patch etc, Wellbutrin and Chantix is discussed 3. Goal and date of compete cessation is discussed 4. Total time spent in smoking cessation is 15 min.  - DG Chest 2 View; Future  3. Shingles (herpes zoster) polyneuropathy Recovering at this time. Completed anti-virals, and remains on gabapentin, for post herpetic neuralgia.   4. SOB (shortness of breath) Unable to do spiro due to cough.   5. Aortic atherosclerosis ([place]) On CTA 07/16/2018  General Counseling: I have discussed the findings of the evaluation and examination with Karlissa.  I have also discussed any further diagnostic evaluation thatmay be needed or ordered today. Faten verbalizes understanding of the findings of todays visit. We also reviewed her medications today and discussed drug interactions and side effects including but not limited excessive drowsiness and altered mental states. We also discussed that there is always a risk not just to her but also people around her. she has been encouraged to call the office with any questions or concerns that should arise related to todays visit.  No orders of the defined types were placed in this encounter.    Time spent: 25 This patient was seen by Orson Gear AGNP-C in Collaboration with Dr. Devona Konig as a part of collaborative care agreement.   I have personally obtained a history, examined the patient, evaluated laboratory and imaging results, formulated the assessment and plan and placed orders.    Allyne Gee, MD [facility] Pulmonary and Critical Care Sleep medicine

## 2020-06-01 ENCOUNTER — Encounter: Payer: Self-pay | Admitting: Nurse Practitioner

## 2020-06-01 ENCOUNTER — Ambulatory Visit (INDEPENDENT_AMBULATORY_CARE_PROVIDER_SITE_OTHER): Payer: BC Managed Care – PPO | Admitting: Nurse Practitioner

## 2020-06-01 VITALS — Resp 16 | Ht 61.0 in

## 2020-06-01 DIAGNOSIS — B0223 Postherpetic polyneuropathy: Secondary | ICD-10-CM

## 2020-06-01 DIAGNOSIS — M79622 Pain in left upper arm: Secondary | ICD-10-CM | POA: Diagnosis not present

## 2020-06-01 MED ORDER — VALACYCLOVIR HCL 1 G PO TABS: 1000.0000 mg | ORAL_TABLET | Freq: Two times a day (BID) | ORAL | 0 refills | Status: DC

## 2020-06-01 MED ORDER — GABAPENTIN 300 MG PO CAPS: 300.0000 mg | ORAL_CAPSULE | Freq: Every day | ORAL | 2 refills | Status: DC

## 2020-06-01 MED ORDER — HYDROCODONE-ACETAMINOPHEN 5-325 MG PO TABS: 1.0000 | ORAL_TABLET | Freq: Four times a day (QID) | ORAL | 0 refills | Status: DC | PRN

## 2020-06-01 NOTE — Progress Notes (Signed)
[facility] [place], [place] [address]  Internal MEDICINE  Telephone Visit  Patient Name: Stephanie Hess  [id number]  [id number]  Date of Service: 06/28/2020  I connected with the patient at 5:00pm by telephone and verified the patients identity using two identifiers.   I discussed the limitations, risks, security and privacy concerns of performing an evaluation and management service by telephone and the availability of in person appointments. I also discussed with the patient that there may be a patient responsible charge related to the service.  The patient expressed understanding and agrees to proceed.    Chief Complaint  Patient presents with  . Telephone Assessment  . Telephone Screen  . Herpes Zoster    Blisters on arm, saw spots, feels like shingles are coming back  . Quality Metric Gaps    TDAP, DEXA    The patient has been contacted via telephone for follow up visit due to concerns for spread of novel coronavirus. The patient presents for acute visit. She states that she has developed pain and rash along the inner upper aspect of the left arm. Has recently had shingles infection in same area. These symptoms are consistent with symptoms she has had previously. The elbow is vs very tender. There are also a few blister-like lesions on the upper inner left arm. They are tender and a little itchy. She denies fever, but states that she does have some generalized body aches and fatugue.       Current Medication: Outpatient Encounter Medications as of 06/01/2020  Medication Sig  . aspirin EC 81 MG tablet Take 1 tablet (81 mg total) by mouth daily.  . ergocalciferol (VITAMIN D2) 1.25 MG (50000 UT) capsule ergocalciferol (vitamin D2) 1,250 mcg (50,000 unit) capsule  . gabapentin (NEURONTIN) 300 MG capsule Take 1 capsule (300 mg total) by mouth at bedtime.  Marland Kitchen HYDROcodone-acetaminophen (NORCO/VICODIN) 5-325 MG tablet Take 1 tablet by mouth every 6 (six) hours  as needed for moderate pain.  Marland Kitchen ibuprofen (ADVIL,MOTRIN) 800 MG tablet Take 1 tablet (800 mg total) by mouth every 8 (eight) hours as needed.  Marland Kitchen omeprazole (PRILOSEC) 20 MG capsule Take 20 mg by mouth daily.  . OXYGEN Inhale into the lungs. 2 litre at night  . [DISCONTINUED] budesonide-formoterol (SYMBICORT) 160-4.5 MCG/ACT inhaler Inhale 2 puffs into the lungs 2 (two) times daily as needed (shortness of breath).  . [DISCONTINUED] gabapentin (NEURONTIN) 300 MG capsule Take 1 capsule (300 mg total) by mouth at bedtime.  . [DISCONTINUED] HYDROcodone-acetaminophen (NORCO/VICODIN) 5-325 MG tablet Take 1 tablet by mouth every 6 (six) hours as needed for moderate pain.  . [DISCONTINUED] nitroGLYCERIN (NITROSTAT) 0.4 MG SL tablet Place 1 tablet (0.4 mg total) under the tongue every 5 (five) minutes as needed.  . valACYclovir (VALTREX) 1000 MG tablet Take 1 tablet (1,000 mg total) by mouth 2 (two) times daily.   No facility-administered encounter medications on file as of 06/01/2020.    Surgical History: Past Surgical History:  Procedure Laterality Date  . APPENDECTOMY    . CESAREAN SECTION    . ECTOPIC PREGNANCY SURGERY      Medical History: Past Medical History:  Diagnosis Date  . Cancer ([place])   . COPD (chronic obstructive pulmonary disease) ([place])   . Diverticula of colon   . GERD (gastroesophageal reflux disease)   . Hyperlipidemia   . Renal insufficiency    2009 or 2010    Family History: Family History  Problem Relation Age of Onset  .  Breast cancer Paternal Aunt   . Heart attack Mother     Social History   Socioeconomic History  . Marital status: Divorced    Spouse name: Not on file  . Number of children: Not on file  . Years of education: Not on file  . Highest education level: Not on file  Occupational History  . Not on file  Tobacco Use  . Smoking status: Current Every Day Smoker    Packs/day: 0.50    Years: 50.00    Pack years: 25.00    Types: Cigarettes  .  Smokeless tobacco: Never Used  Vaping Use  . Vaping Use: Never used  Substance and Sexual Activity  . Alcohol use: Yes    Comment: social  . Drug use: No  . Sexual activity: Never  Other Topics Concern  . Not on file  Social History Narrative  . Not on file   Social Determinants of Health   Financial Resource Strain:   . Difficulty of Paying Living Expenses:   Food Insecurity:   . Worried About Charity fundraiser in the Last Year:   . Arboriculturist in the Last Year:   Transportation Needs:   . Film/video editor (Medical):   Marland Kitchen Lack of Transportation (Non-Medical):   Physical Activity:   . Days of Exercise per Week:   . Minutes of Exercise per Session:   Stress:   . Feeling of Stress :   Social Connections:   . Frequency of Communication with Friends and Family:   . Frequency of Social Gatherings with Friends and Family:   . Attends Religious Services:   . Active Member of Clubs or Organizations:   . Attends Archivist Meetings:   Marland Kitchen Marital Status:   Intimate Partner Violence:   . Fear of Current or Ex-Partner:   . Emotionally Abused:   Marland Kitchen Physically Abused:   . Sexually Abused:       Review of Systems  Constitutional: Positive for fatigue. Negative for chills, fever and unexpected weight change.  HENT: Negative for congestion, postnasal drip, rhinorrhea, sneezing and sore throat.   Respiratory: Negative for cough, chest tightness, shortness of breath and wheezing.   Cardiovascular: Negative for chest pain and palpitations.  Gastrointestinal: Negative for abdominal pain, constipation, diarrhea, nausea and vomiting.  Musculoskeletal: Positive for arthralgias and myalgias. Negative for back pain, joint swelling and neck pain.       Left upper arm and left shoulder.   Skin: Positive for rash.       Few pustular lesions on the left arm which are painful.   Allergic/Immunologic: Negative for environmental allergies.  Neurological: Negative for  dizziness, tremors, numbness and headaches.  Hematological: Negative for adenopathy. Does not bruise/bleed easily.  Psychiatric/Behavioral: Negative for behavioral problems (Depression), sleep disturbance and suicidal ideas. The patient is not nervous/anxious.     Today's Vitals   06/01/20 1617  Resp: 16  Height: 5\' 1"  (1.549 m)   Body mass index is 28.19 kg/m.   Observation/Objective:   The patient is alert and oriented. She is pleasant and answers all questions appropriately. Breathing is non-labored. She is in no acute distress at this time.    Assessment/Plan: 1. Shingles (herpes zoster) polyneuropathy Start valacyclovir 1000mg  twice daily for next 5 days. Add back gabapentin 300mg  at bedtime. May take hydrocodone/APAP 5/325mg  tablets four times daily as needed for severe pain. A short term prescription for #16 tablets was sent to her pharmacy.  -  gabapentin (NEURONTIN) 300 MG capsule; Take 1 capsule (300 mg total) by mouth at bedtime.  Dispense: 30 capsule; Refill: 2 - HYDROcodone-acetaminophen (NORCO/VICODIN) 5-325 MG tablet; Take 1 tablet by mouth every 6 (six) hours as needed for moderate pain.  Dispense: 16 tablet; Refill: 0 - valACYclovir (VALTREX) 1000 MG tablet; Take 1 tablet (1,000 mg total) by mouth 2 (two) times daily.  Dispense: 10 tablet; Refill: 0  2. Left upper arm pain May take hydrocodone/APAP 5/325mg  tablets four times daily as needed for severe pain. A short term prescription for #16 tablets was sent to her pharmacy.  - HYDROcodone-acetaminophen (NORCO/VICODIN) 5-325 MG tablet; Take 1 tablet by mouth every 6 (six) hours as needed for moderate pain.  Dispense: 16 tablet; Refill: 0  General Counseling: Etana verbalizes understanding of the findings of today's phone visit and agrees with plan of treatment. I have discussed any further diagnostic evaluation that may be needed or ordered today. We also reviewed her medications today. she has been encouraged to call  the office with any questions or concerns that should arise related to todays visit.  Reviewed risks and possible side effects associated with taking opiates, benzodiazepines and other CNS depressants. Combination of these could cause dizziness and drowsiness. Advised patient not to drive or operate machinery when taking these medications, as patient's and other's life can be at risk and will have consequences. Patient verbalized understanding in this matter. Dependence and abuse for these drugs will be monitored closely. A Controlled substance policy and procedure is on file which allows [facility] to order a urine drug screen test at any visit. Patient understands and agrees with the plan  This patient was seen by Leretha Pol FNP Collaboration with Dr Lavera Guise as a part of collaborative care agreement  Meds ordered this encounter  Medications  . gabapentin (NEURONTIN) 300 MG capsule    Sig: Take 1 capsule (300 mg total) by mouth at bedtime.    Dispense:  30 capsule    Refill:  2    Order Specific Question:   Supervising Provider    Answer:   Lavera Guise [[id number]]  . HYDROcodone-acetaminophen (NORCO/VICODIN) 5-325 MG tablet    Sig: Take 1 tablet by mouth every 6 (six) hours as needed for moderate pain.    Dispense:  16 tablet    Refill:  0    Order Specific Question:   Supervising Provider    Answer:   Lavera Guise [[id number]]  . valACYclovir (VALTREX) 1000 MG tablet    Sig: Take 1 tablet (1,000 mg total) by mouth 2 (two) times daily.    Dispense:  10 tablet    Refill:  0    Order Specific Question:   Supervising Provider    Answer:   Lavera Guise [[id number]]    Time spent: 76 Minutes    Dr Lavera Guise Internal medicine

## 2020-08-14 ENCOUNTER — Encounter: Payer: Self-pay | Admitting: Certified Nurse Midwife

## 2020-08-14 ENCOUNTER — Other Ambulatory Visit: Payer: Self-pay

## 2020-08-14 ENCOUNTER — Ambulatory Visit (INDEPENDENT_AMBULATORY_CARE_PROVIDER_SITE_OTHER): Payer: BC Managed Care – PPO | Admitting: Certified Nurse Midwife

## 2020-08-14 VITALS — BP 120/73 | HR 84 | Ht 61.0 in | Wt 156.4 lb

## 2020-08-14 DIAGNOSIS — Z1231 Encounter for screening mammogram for malignant neoplasm of breast: Secondary | ICD-10-CM

## 2020-08-14 DIAGNOSIS — Z9189 Other specified personal risk factors, not elsewhere classified: Secondary | ICD-10-CM

## 2020-08-14 DIAGNOSIS — Z1159 Encounter for screening for other viral diseases: Secondary | ICD-10-CM

## 2020-08-14 DIAGNOSIS — Z01419 Encounter for gynecological examination (general) (routine) without abnormal findings: Secondary | ICD-10-CM | POA: Diagnosis not present

## 2020-08-14 NOTE — Progress Notes (Signed)
GYNECOLOGY ANNUAL PREVENTATIVE CARE ENCOUNTER NOTE  History:     Stephanie Hess is a 68 y.o. G2P2 female here for a routine annual gynecologic exam.  Current complaints: difficluty sleeping is current taking gabapentin.   Denies abnormal vaginal bleeding, discharge, pelvic pain, problems with intercourse or other gynecologic concerns.     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 Patient is postmenopausal. Contraception: post menopausal status Last Pap: 05/02/2017. Results were: normal with negative HPV Last mammogram: 10/26/19. Results were: normal  Obstetric History OB History  Gravida Para Term Preterm AB Living  2 2          SAB TAB Ectopic Multiple Live Births               # Outcome Date GA Lbr Len/2nd Weight Sex Delivery Anes PTL Lv  2 Para 1989    F CS-Unspec     1 Para 1985    F CS-Unspec       Past Medical History:  Diagnosis Date  . Cancer ([place])   . COPD (chronic obstructive pulmonary disease) ([place])   . Diverticula of colon   . GERD (gastroesophageal reflux disease)   . Hyperlipidemia   . Renal insufficiency    2009 or 2010    Past Surgical History:  Procedure Laterality Date  . APPENDECTOMY    . CESAREAN SECTION    . ECTOPIC PREGNANCY SURGERY      Current Outpatient Medications on File Prior to Visit  Medication Sig Dispense Refill  . aspirin EC 81 MG tablet Take 1 tablet (81 mg total) by mouth daily. 90 tablet 3  . ergocalciferol (VITAMIN D2) 1.25 MG (50000 UT) capsule ergocalciferol (vitamin D2) 1,250 mcg (50,000 unit) capsule    . gabapentin (NEURONTIN) 300 MG capsule Take 1 capsule (300 mg total) by mouth at bedtime. 30 capsule 2  . HYDROcodone-acetaminophen (NORCO/VICODIN) 5-325 MG tablet Take 1 tablet by mouth every 6 (six) hours as needed for moderate pain. 16 tablet 0  . ibuprofen  (ADVIL,MOTRIN) 800 MG tablet Take 1 tablet (800 mg total) by mouth every 8 (eight) hours as needed. 30 tablet 0  . omeprazole (PRILOSEC) 20 MG capsule Take 20 mg by mouth daily.    . OXYGEN Inhale into the lungs. 2 litre at night    . valACYclovir (VALTREX) 1000 MG tablet Take 1 tablet (1,000 mg total) by mouth 2 (two) times daily. 10 tablet 0   No current facility-administered medications on file prior to visit.    Allergies  Allergen Reactions  . Penicillins Nausea And Vomiting and Other (See Comments)    Has patient had a PCN reaction causing immediate rash, facial/tongue/throat swelling, SOB or lightheadedness with hypotension: No Has patient had a PCN reaction causing severe rash involving mucus membranes or skin necrosis: No Has patient had a PCN reaction that required hospitalization: No Has patient had a PCN reaction occurring within the last 10 years: Yes If all of the above answers are "NO", then may proceed with Cephalosporin use.   . Shellfish Allergy Nausea And Vomiting  . Sulfa Antibiotics Rash and Other (See Comments)    Other reaction(s): Unknown  . Biaxin [Clarithromycin] Rash  . Amoxicillin-Pot Clavulanate Nausea And Vomiting  . Iodinated Diagnostic Agents Other (See Comments)    acute renal insufficiency acute renal insufficiency  . Atorvastatin Rash  . Macrobid [Nitrofurantoin]  Rash    Social History:  reports that she has been smoking cigarettes. She has a 25.00 pack-year smoking history. She has never used smokeless tobacco. She reports current alcohol use. She reports that she does not use drugs.  Family History  Problem Relation Age of Onset  . Breast cancer Paternal Aunt   . Heart attack Mother     The following portions of the patient's history were reviewed and updated as appropriate: allergies, current medications, past family history, past medical history, past social history, past surgical history and problem list.  Review of Systems Pertinent  items noted in HPI and remainder of comprehensive ROS otherwise negative.  Physical Exam:  There were no vitals taken for this visit. CONSTITUTIONAL: Well-developed, well-nourished female in no acute distress.  HENT:  Normocephalic, atraumatic, External right and left ear normal. Oropharynx is clear and moist EYES: Conjunctivae and EOM are normal. Pupils are equal, round, and reactive to light. No scleral icterus.  NECK: Normal range of motion, supple, no masses.  Normal thyroid.  SKIN: Skin is warm and dry. No rash noted. Not diaphoretic. No erythema. No pallor. MUSCULOSKELETAL: Normal range of motion. No tenderness.  No cyanosis, clubbing, or edema.  2+ distal pulses. NEUROLOGIC: Alert and oriented to person, place, and time. Normal reflexes, muscle tone coordination.  PSYCHIATRIC: Normal mood and affect. Normal behavior. Normal judgment and thought content. CARDIOVASCULAR: Normal heart rate noted, regular rhythm RESPIRATORY: Clear to auscultation bilaterally. Effort and breath sounds normal, no problems with respiration noted. BREASTS: Symmetric in size. No masses, tenderness, skin changes, nipple drainage, or lymphadenopathy bilaterally.  ABDOMEN: Soft, no distention noted.  No tenderness, rebound or guarding.  PELVIC: Normal appearing external genitalia and urethral meatus; normal appearing vaginal mucosa and cervix.  No abnormal discharge noted.  Pap smear not indicated.  Normal uterine size, no other palpable masses, no uterine or adnexal tenderness.  .   Assessment and Plan:    1. Women's annual routine gynecological examination . Pap: not indicated Mammogram :orders placed  Labs:Hep C, Dexa scan Colonoscopy 2020 WNL Refills:nont Referral:none Recommend pt reach out to provider that is prescribing the gabapentin for sleep to discuss increasing dose or change medication. Discussed use of trazodone.  Routine preventative health maintenance measures emphasized. Please refer to After  Visit Summary for other counseling recommendations.      Philip Aspen, CNM [facility]

## 2020-08-14 NOTE — Patient Instructions (Signed)
Exercising to Stay Healthy To become healthy and stay healthy, it is recommended that you do moderate-intensity and vigorous-intensity exercise. You can tell that you are exercising at a moderate intensity if your heart starts beating faster and you start breathing faster but can still hold a conversation. You can tell that you are exercising at a vigorous intensity if you are breathing much harder and faster and cannot hold a conversation while exercising. Exercising regularly is important. It has many health benefits, such as:  Improving overall fitness, flexibility, and endurance.  Increasing bone density.  Helping with weight control.  Decreasing body fat.  Increasing muscle strength.  Reducing stress and tension.  Improving overall health. How often should I exercise? Choose an activity that you enjoy, and set realistic goals. Your health care provider can help you make an activity plan that works for you. Exercise regularly as told by your health care provider. This may include:  Doing strength training two times a week, such as: ? Lifting weights. ? Using resistance bands. ? Push-ups. ? Sit-ups. ? Yoga.  Doing a certain intensity of exercise for a given amount of time. Choose from these options: ? A total of 150 minutes of moderate-intensity exercise every week. ? A total of 75 minutes of vigorous-intensity exercise every week. ? A mix of moderate-intensity and vigorous-intensity exercise every week. Children, pregnant women, people who have not exercised regularly, people who are overweight, and older adults may need to talk with a health care provider about what activities are safe to do. If you have a medical condition, be sure to talk with your health care provider before you start a new exercise program. What are some exercise ideas? Moderate-intensity exercise ideas include:  Walking 1 mile (1.6 km) in about 15  minutes.  Biking.  Hiking.  Golfing.  Dancing.  Water aerobics. Vigorous-intensity exercise ideas include:  Walking 4.5 miles (7.2 km) or more in about 1 hour.  Jogging or running 5 miles (8 km) in about 1 hour.  Biking 10 miles (16.1 km) or more in about 1 hour.  Lap swimming.  Roller-skating or in-line skating.  Cross-country skiing.  Vigorous competitive sports, such as football, basketball, and soccer.  Jumping rope.  Aerobic dancing. What are some everyday activities that can help me to get exercise?  Yard work, such as: ? Pushing a lawn mower. ? Raking and bagging leaves.  Washing your car.  Pushing a stroller.  Shoveling snow.  Gardening.  Washing windows or floors. How can I be more active in my day-to-day activities?  Use stairs instead of an elevator.  Take a walk during your lunch break.  If you drive, park your car farther away from your work or school.  If you take public transportation, get off one stop early and walk the rest of the way.  Stand up or walk around during all of your indoor phone calls.  Get up, stretch, and walk around every 30 minutes throughout the day.  Enjoy exercise with a friend. Support to continue exercising will help you keep a regular routine of activity. What guidelines can I follow while exercising?  Before you start a new exercise program, talk with your health care provider.  Do not exercise so much that you hurt yourself, feel dizzy, or get very short of breath.  Wear comfortable clothes and wear shoes with good support.  Drink plenty of water while you exercise to prevent dehydration or heat stroke.  Work out until your breathing   and your heartbeat get faster. Where to find more information  U.S. Department of Health and Human Services: www.hhs.gov  Centers for Disease Control and Prevention (CDC): www.cdc.gov Summary  Exercising regularly is important. It will improve your overall fitness,  flexibility, and endurance.  Regular exercise also will improve your overall health. It can help you control your weight, reduce stress, and improve your bone density.  Do not exercise so much that you hurt yourself, feel dizzy, or get very short of breath.  Before you start a new exercise program, talk with your health care provider. This information is not intended to replace advice given to you by your health care provider. Make sure you discuss any questions you have with your health care provider. Document Revised: 10/10/2017 Document Reviewed: 09/18/2017 Elsevier Patient Education  2020 Elsevier Inc.  

## 2020-08-15 LAB — HEPATITIS C ANTIBODY: Hep C Virus Ab: <0.1 s/co ratio (ref 0.0–0.9)

## 2020-08-21 ENCOUNTER — Telehealth: Payer: Self-pay

## 2020-08-21 NOTE — Telephone Encounter (Signed)
Confirmed and screened for 08-23-20 ov.

## 2020-08-22 ENCOUNTER — Ambulatory Visit
Admission: RE | Admit: 2020-08-22 | Discharge: 2020-08-22 | Disposition: A | Discharge status: Home / Self Care | Payer: BC Managed Care – PPO | Source: Ambulatory Visit | Attending: Adult Health | Admitting: Adult Health

## 2020-08-22 ENCOUNTER — Other Ambulatory Visit: Payer: Self-pay

## 2020-08-22 ENCOUNTER — Ambulatory Visit
Admission: RE | Admit: 2020-08-22 | Discharge: 2020-08-22 | Disposition: A | Discharge status: Home / Self Care | Payer: BC Managed Care – PPO | Attending: Adult Health | Admitting: Adult Health

## 2020-08-22 DIAGNOSIS — J4520 Mild intermittent asthma, uncomplicated: Secondary | ICD-10-CM | POA: Insufficient documentation

## 2020-08-22 DIAGNOSIS — F17200 Nicotine dependence, unspecified, uncomplicated: Secondary | ICD-10-CM

## 2020-08-23 ENCOUNTER — Ambulatory Visit: Payer: BC Managed Care – PPO | Admitting: Internal Medicine

## 2020-08-23 DIAGNOSIS — J4520 Mild intermittent asthma, uncomplicated: Secondary | ICD-10-CM

## 2020-08-23 LAB — PULMONARY FUNCTION TEST

## 2020-08-25 NOTE — Procedures (Signed)
[place], [id number]  DATE OF SERVICE: August 23, 2020  Complete Pulmonary Function Testing Interpretation:  FINDINGS:  Forced vital capacity is normal.  FEV1 is 1.62 L which is 77% of predicted and is mildly decreased.  Postbronchodilator there was no significant improvement in the FEV1 clinical improvement may still occur in the absence of spirometric improvement.  Total capacity is increased residual volume is increased residual abdominal capacity ratio is increased.  FRC is increased.  DLCO is mildly decreased.  IMPRESSION:  This pulmonary function study is consistent with mild obstructive lung disease.  DLCO was also mildly decreased.  Clinical correlation is recommended  Allyne Gee, MD [facility] Pulmonary Critical Care Medicine Sleep Medicine

## 2020-09-12 ENCOUNTER — Other Ambulatory Visit: Payer: Self-pay

## 2020-09-12 ENCOUNTER — Ambulatory Visit: Payer: BC Managed Care – PPO | Admitting: Internal Medicine

## 2020-09-12 ENCOUNTER — Encounter: Payer: Self-pay | Admitting: Internal Medicine

## 2020-09-12 DIAGNOSIS — J449 Chronic obstructive pulmonary disease, unspecified: Secondary | ICD-10-CM | POA: Diagnosis not present

## 2020-09-12 DIAGNOSIS — G4700 Insomnia, unspecified: Secondary | ICD-10-CM | POA: Diagnosis not present

## 2020-09-12 DIAGNOSIS — F17200 Nicotine dependence, unspecified, uncomplicated: Secondary | ICD-10-CM | POA: Diagnosis not present

## 2020-09-12 IMAGING — MG DIGITAL SCREENING BILAT W/ TOMO W/ CAD
8 series · 8 of 24 positions shown · non-contrast
Comparison: Previous exam(s).

CLINICAL DATA: Screening.

EXAM:
DIGITAL SCREENING BILATERAL MAMMOGRAM WITH TOMO AND CAD

[L CC synth-2D]
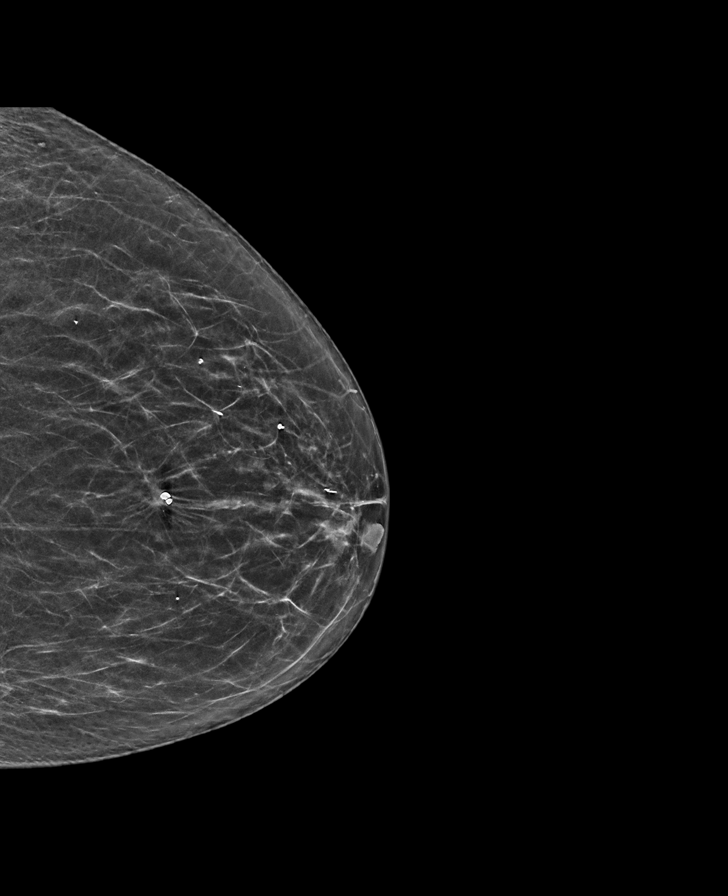

[R CC synth-2D]
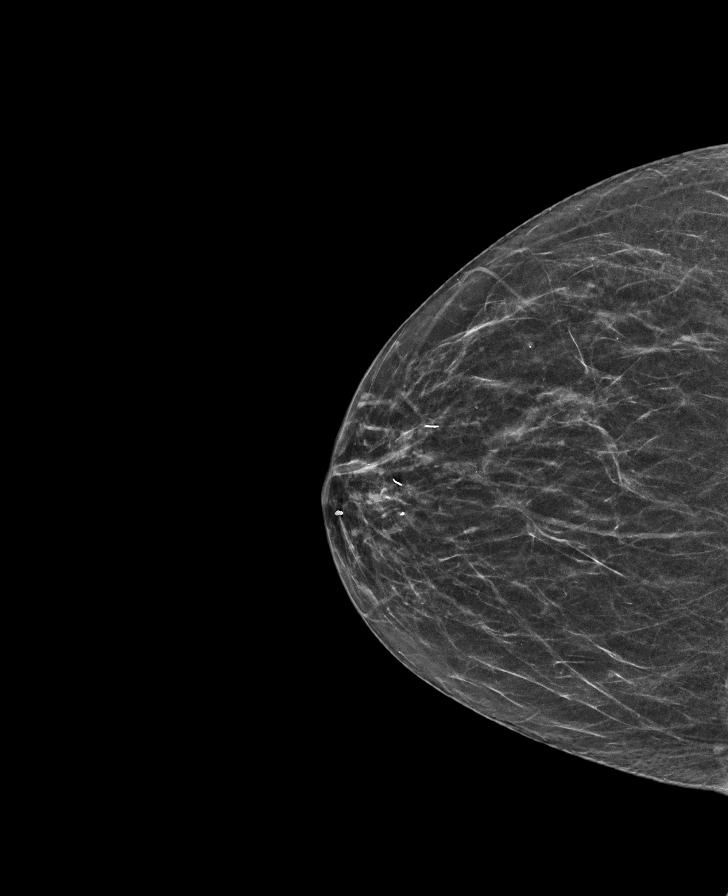

[R MLO synth-2D]
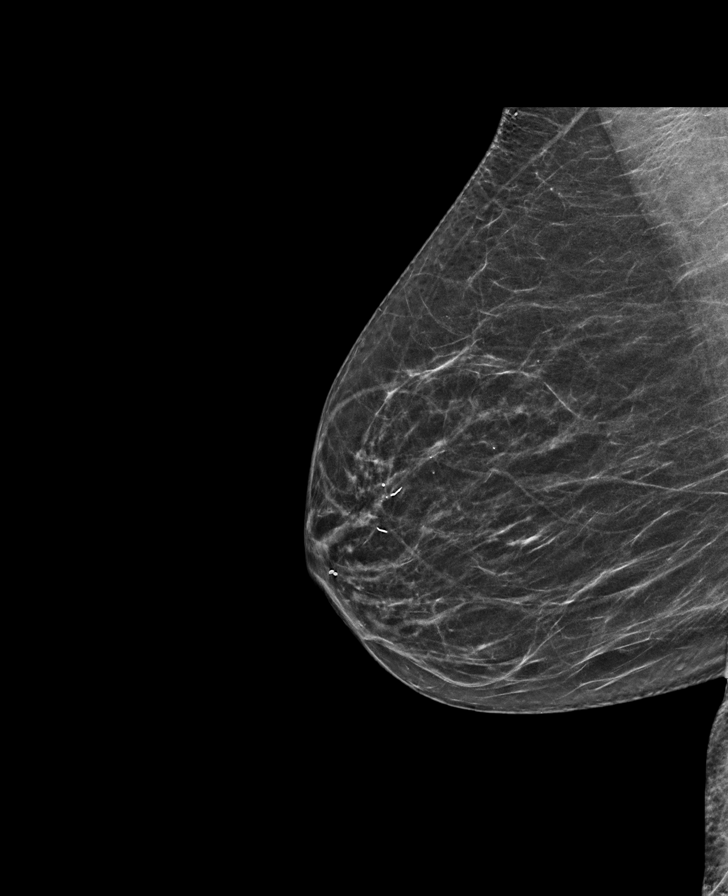

[L MLO synth-2D]
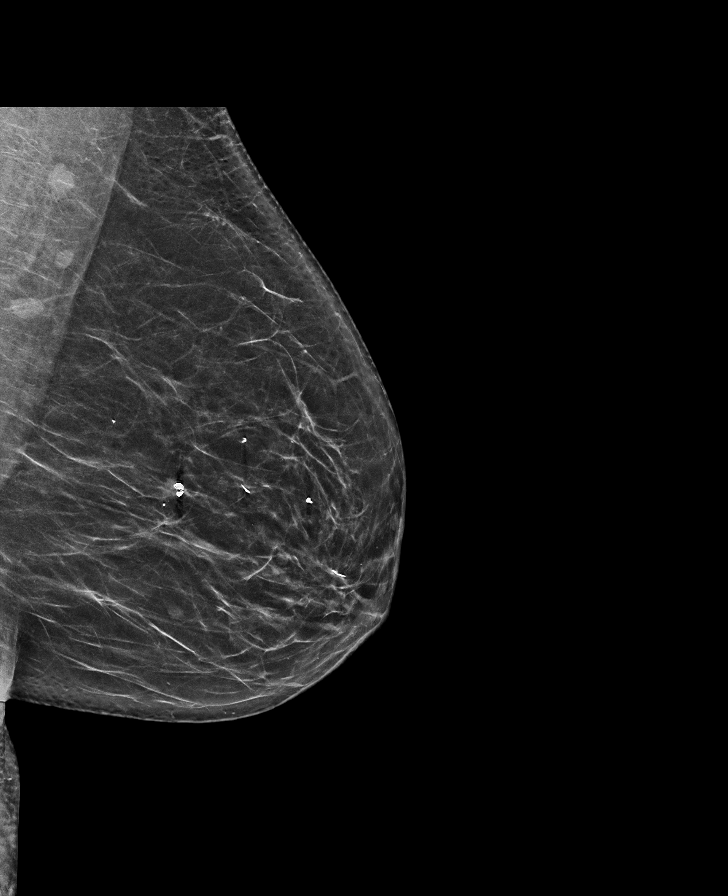

[R CC tomo · tomo slice 27/52.0]
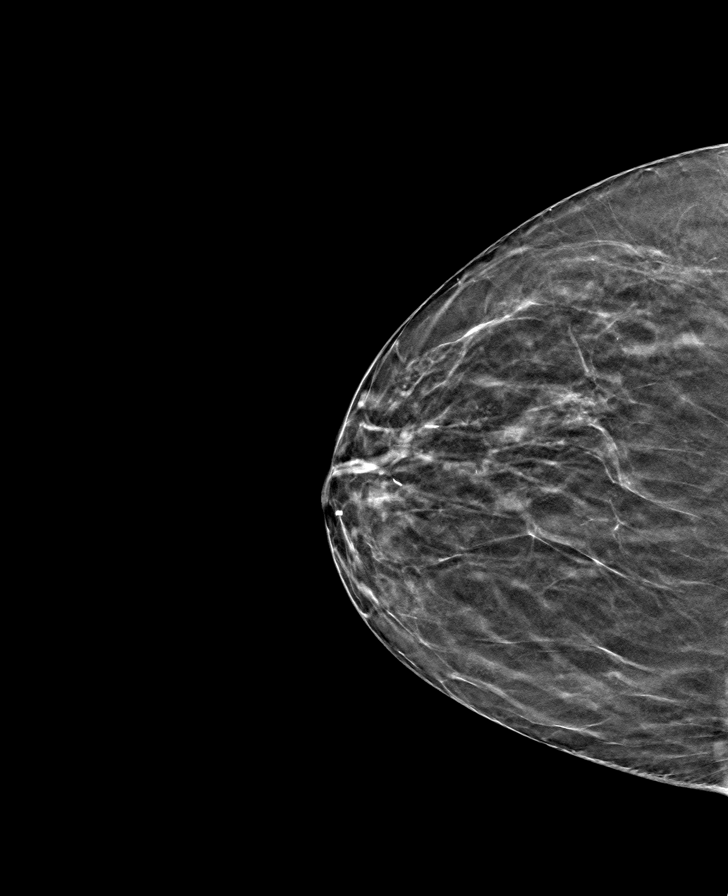

[L MLO tomo · tomo slice 35/70.0]
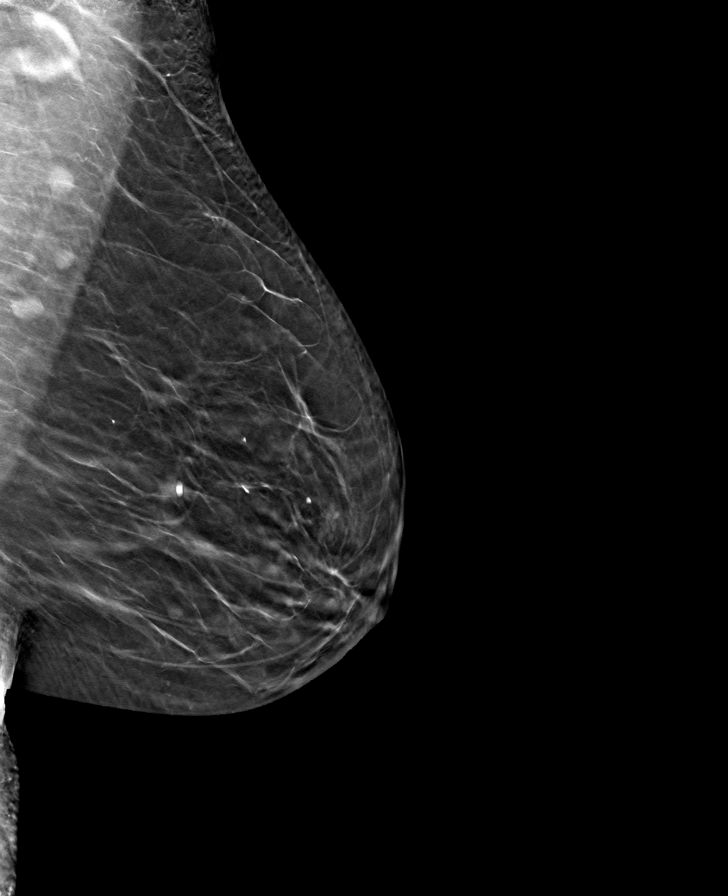

[L CC tomo · tomo slice 31/62.0]
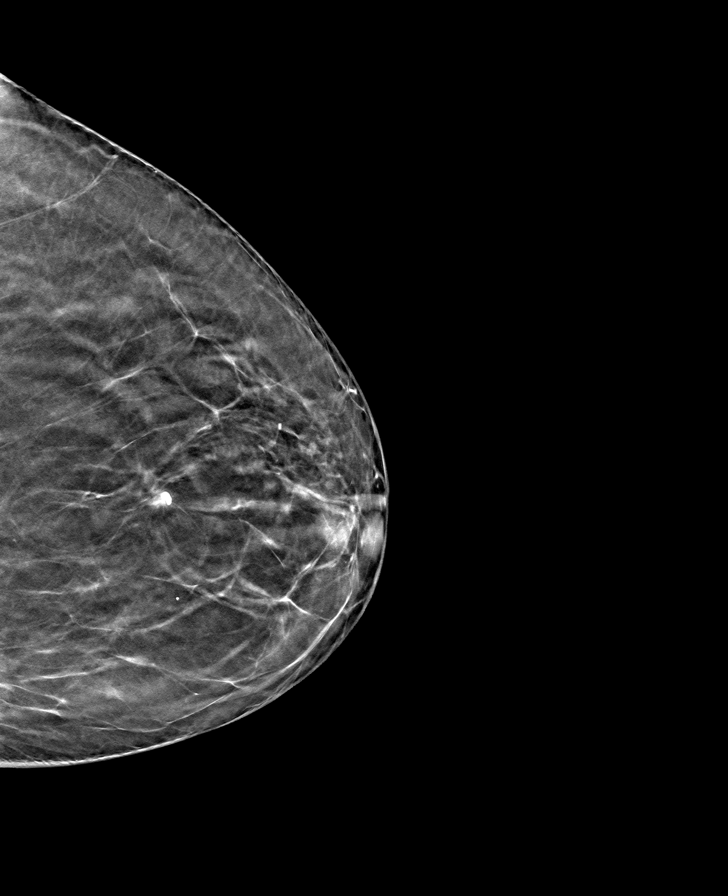

[R MLO tomo · tomo slice 28/55.0]
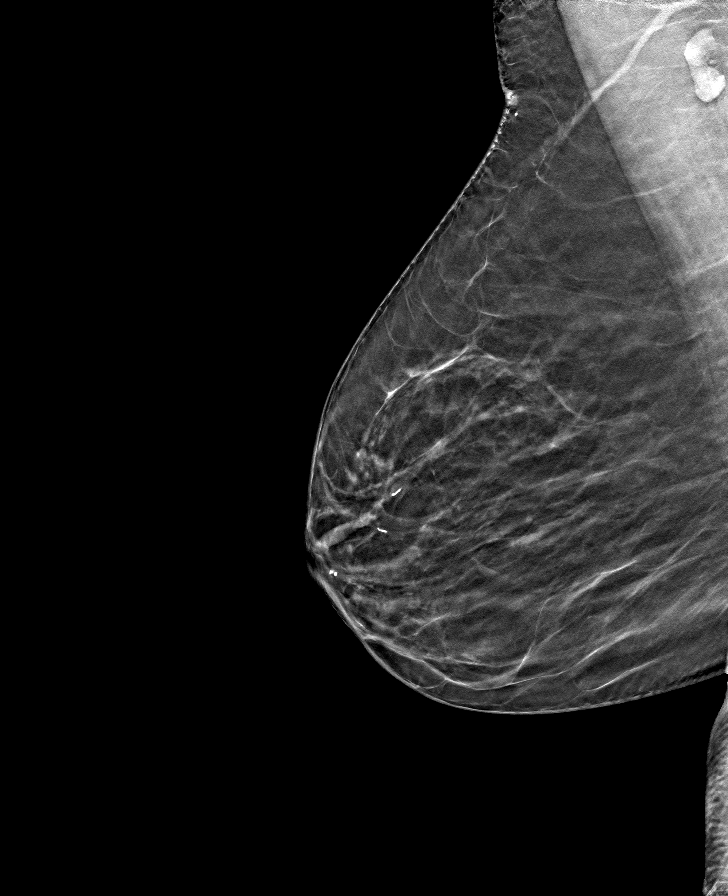

[8 of 24 positions shown; findings below may reference images not displayed]

ACR Breast Density Category b: There are scattered areas of
fibroglandular density.
FINDINGS: There are no findings suspicious for malignancy. Images were
processed with CAD.
IMPRESSION: No mammographic evidence of malignancy. A result letter of this
screening mammogram will be mailed directly to the patient.

RECOMMENDATION:
Screening mammogram in one year. (Code:[id number])

BI-RADS CATEGORY  1: Negative.

## 2020-09-12 MED ORDER — MIRTAZAPINE 15 MG PO TABS: 15.0000 mg | ORAL_TABLET | Freq: Every day | ORAL | 0 refills | Status: DC

## 2020-09-12 NOTE — Progress Notes (Signed)
[facility] [place], [place] [address]  Pulmonary Sleep Medicine   Office Visit Note  Patient Name: Stephanie Hess DOB: Mar 30, 1951 MRN [id number]  Date of Service: 09/14/2020  Complaints/HPI: Patient is here for routine pulmonary visit Followed for chronic obstructive disease, she does continue to smoke about 1/2 PPD, is trying to cut back on her smoking Today she feels that her breathing is well controlled Was given samples of Breztri at her last visit--feels this is well controlled her symptoms at this time  Her complaint today is that she has difficulty sleeping--reports she does have restless sensation in her legs in the evenings and feels this contributes to her insomnia Has taken Nyquil for quite some time in the past and was helping with her sleep but recently has found that it is not helping much anymore  ROS  General: (-) fever, (-) chills, (-) night sweats, (-) weakness Skin: (-) rashes, (-) itching,. Eyes: (-) visual changes, (-) redness, (-) itching. Nose and Sinuses: (-) nasal stuffiness or itchiness, (-) postnasal drip, (-) nosebleeds, (-) sinus trouble. Mouth and Throat: (-) sore throat, (-) hoarseness. Neck: (-) swollen glands, (-) enlarged thyroid, (-) neck pain. Respiratory: - cough, (-) bloody sputum, - shortness of breath, - wheezing. Cardiovascular: - ankle swelling, (-) chest pain. Lymphatic: (-) lymph node enlargement. Neurologic: (-) numbness, (-) tingling. Psychiatric: (-) anxiety, (-) depression   Current Medication: Outpatient Encounter Medications as of 09/12/2020  Medication Sig  . aspirin EC 81 MG tablet Take 1 tablet (81 mg total) by mouth daily.  . Budeson-Glycopyrrol-Formoterol (BREZTRI AEROSPHERE) 160-9-4.8 MCG/ACT AERO Inhale into the lungs.  . calcium-vitamin D (OSCAL WITH D) 500-200 MG-UNIT tablet Take 1 tablet by mouth.  . ergocalciferol (VITAMIN D2) 1.25 MG (50000 UT) capsule ergocalciferol (vitamin D2) 1,250  mcg (50,000 unit) capsule  . gabapentin (NEURONTIN) 300 MG capsule Take 1 capsule (300 mg total) by mouth at bedtime.  Marland Kitchen HYDROcodone-acetaminophen (NORCO/VICODIN) 5-325 MG tablet Take 1 tablet by mouth every 6 (six) hours as needed for moderate pain.  Marland Kitchen ibuprofen (ADVIL,MOTRIN) 800 MG tablet Take 1 tablet (800 mg total) by mouth every 8 (eight) hours as needed.  Marland Kitchen omeprazole (PRILOSEC) 20 MG capsule Take 20 mg by mouth daily.  . OXYGEN Inhale into the lungs. 2 litre at night  . valACYclovir (VALTREX) 1000 MG tablet Take 1 tablet (1,000 mg total) by mouth 2 (two) times daily.  . mirtazapine (REMERON) 15 MG tablet Take 1 tablet (15 mg total) by mouth at bedtime.   No facility-administered encounter medications on file as of 09/12/2020.    Surgical History: Past Surgical History:  Procedure Laterality Date  . APPENDECTOMY    . CESAREAN SECTION    . ECTOPIC PREGNANCY SURGERY      Medical History: Past Medical History:  Diagnosis Date  . Cancer ([place])   . COPD (chronic obstructive pulmonary disease) ([place])   . Diverticula of colon   . GERD (gastroesophageal reflux disease)   . Hyperlipidemia   . Renal insufficiency    2009 or 2010    Family History: Family History  Problem Relation Age of Onset  . Breast cancer Paternal Aunt   . Heart attack Mother     Social History: Social History   Socioeconomic History  . Marital status: Divorced    Spouse name: Not on file  . Number of children: Not on file  . Years of education: Not on file  . Highest education level: Not on  file  Occupational History  . Not on file  Tobacco Use  . Smoking status: Current Every Day Smoker    Packs/day: 0.50    Years: 50.00    Pack years: 25.00    Types: Cigarettes  . Smokeless tobacco: Never Used  Vaping Use  . Vaping Use: Never used  Substance and Sexual Activity  . Alcohol use: Yes    Comment: social  . Drug use: No  . Sexual activity: Not Currently  Other Topics Concern  . Not on file   Social History Narrative  . Not on file   Social Determinants of Health   Financial Resource Strain:   . Difficulty of Paying Living Expenses: Not on file  Food Insecurity:   . Worried About Charity fundraiser in the Last Year: Not on file  . Ran Out of Food in the Last Year: Not on file  Transportation Needs:   . Lack of Transportation (Medical): Not on file  . Lack of Transportation (Non-Medical): Not on file  Physical Activity:   . Days of Exercise per Week: Not on file  . Minutes of Exercise per Session: Not on file  Stress:   . Feeling of Stress : Not on file  Social Connections:   . Frequency of Communication with Friends and Family: Not on file  . Frequency of Social Gatherings with Friends and Family: Not on file  . Attends Religious Services: Not on file  . Active Member of Clubs or Organizations: Not on file  . Attends Archivist Meetings: Not on file  . Marital Status: Not on file  Intimate Partner Violence:   . Fear of Current or Ex-Partner: Not on file  . Emotionally Abused: Not on file  . Physically Abused: Not on file  . Sexually Abused: Not on file    Vital Signs: Blood pressure 122/82, pulse 91, temperature 98 F (36.7 C), resp. rate 16, height 5\' 2"  (1.575 m), weight 153 lb (69.4 kg), SpO2 96 %.  Examination: General Appearance: The patient is well-developed, well-nourished, and in no distress. Skin: Gross inspection of skin unremarkable. Head: normocephalic, no gross deformities. Eyes: no gross deformities noted. ENT: ears appear grossly normal no exudates. Neck: Supple. No thyromegaly. No LAD. Respiratory: Clear throughout, no wheeze, rhonchi or rales noted. Cardiovascular: Normal S1 and S2 without murmur or rub. Extremities: No cyanosis. pulses are equal. Neurologic: Alert and oriented. No involuntary movements.  LABS: Recent Results (from the past 2160 hour(s))  Hepatitis C Antibody     Status: None   Collection Time: 08/14/20   3:08 PM  Result Value Ref Range   Hep C Virus Ab <0.1 0.0 - 0.9 s/co ratio    Comment:                                   Negative:     < 0.8                              Indeterminate: 0.8 - 0.9                                   Positive:     > 0.9  The CDC recommends that a positive HCV antibody result  be followed up with a HCV Nucleic  Acid Amplification  test ([id number]).   Pulmonary Function Test     Status: None   Collection Time: 08/23/20 10:30 AM  Result Value Ref Range   FEV1     FVC     FEV1/FVC     TLC     DLCO      Radiology: DG Chest 2 View  Result Date: 08/23/2020 CLINICAL DATA:  Cough, smoker. Nicotine dependence with current use. EXAM: CHEST - 2 VIEW COMPARISON:  Radiograph 12/14/2018, chest CT 07/16/2018 FINDINGS: Borderline hyperinflation. There is bronchial thickening that is similar to prior exam. Streaky opacities in the right infrahilar lung. The heart is normal in size. Normal mediastinal contours. No evidence of pulmonary mass. No pneumothorax, pulmonary edema, or pleural effusion. No acute osseous abnormalities are seen. IMPRESSION: Right infrahilar streaky opacities, favor atelectasis. Mucous plugging with seen in this region on prior CT. Background bronchial thickening, unchanged from prior exam. Electronically Signed   By: Keith Rake M.D.   On: 08/23/2020 16:01    No results found.  DG Chest 2 View  Result Date: 08/23/2020 CLINICAL DATA:  Cough, smoker. Nicotine dependence with current use. EXAM: CHEST - 2 VIEW COMPARISON:  Radiograph 12/14/2018, chest CT 07/16/2018 FINDINGS: Borderline hyperinflation. There is bronchial thickening that is similar to prior exam. Streaky opacities in the right infrahilar lung. The heart is normal in size. Normal mediastinal contours. No evidence of pulmonary mass. No pneumothorax, pulmonary edema, or pleural effusion. No acute osseous abnormalities are seen. IMPRESSION: Right infrahilar streaky opacities, favor  atelectasis. Mucous plugging with seen in this region on prior CT. Background bronchial thickening, unchanged from prior exam. Electronically Signed   By: Keith Rake M.D.   On: 08/23/2020 16:01      Assessment and Plan: Patient Active Problem List   Diagnosis Date Noted  . 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 Left upper arm pain 02/13/2020  . Acute upper respiratory infection 12/20/2018  . Sore throat 12/20/2018  . Fever and chills 12/20/2018  . Hemoptysis 12/20/2018  . Urinary tract infection without hematuria 11/25/2018  . Dysuria 11/25/2018  . Respiratory distress 07/16/2018  . Sternoclavicular joint pain 04/01/2018  . Bronchiectasis with acute lower respiratory infection ([place]) 03/03/2018  . Nicotine abuse 03/03/2018  . Mild intermittent asthma without complication 27/05/8674  . Pulmonary nodule 03/03/2018  . Gastroesophageal reflux disease without esophagitis 03/03/2018  . Hyperlipidemia 05/15/2017  . Vitamin D deficiency 05/15/2017    1. Chronic obstructive pulmonary disease, unspecified COPD type ([place]) Symptoms well controlled on Breztri at this time Recent PFT FEV1 1.62 L, 77% predicted value--slight decrease in function since last PFT, will continue to montior  2. Nicotine dependence with current use Smoking cessation counseling: 1. Pt acknowledges the risks of long term smoking, she will try to quite smoking. 2. Options for different medications including nicotine products, chewing gum, patch etc, Wellbutrin and Chantix is discussed 3. Goal and date of compete cessation is discussed 4. Total time spent in smoking cessation is 15 min.   3. Insomnia, unspecified type Will try Remeron to help with sleep, advised to start by taking 1/2 tablet and assess sleep response Will need to consider sleep study testing if insomnia persists and to fully assess potential RLS - mirtazapine (REMERON) 15 MG tablet; Take 1 tablet (15 mg total) by mouth at  bedtime.  Dispense: 30 tablet; Refill: 0  General Counseling: I have discussed the findings of the evaluation and examination with Ashara.  I have also discussed any  further diagnostic evaluation thatmay be needed or ordered today. Prapti verbalizes understanding of the findings of todays visit. We also reviewed her medications today and discussed drug interactions and side effects including but not limited excessive drowsiness and altered mental states. We also discussed that there is always a risk not just to her but also people around her. she has been encouraged to call the office with any questions or concerns that should arise related to todays visit.     Time spent: 30  I have personally obtained a history, examined the patient, evaluated laboratory and imaging results, formulated the assessment and plan and placed orders. This patient was seen by Casey Burkitt AGNP-C in Collaboration with Dr. Devona Konig as a part of collaborative care agreement.    Allyne Gee, MD [facility] Pulmonary and Critical Care Sleep medicine

## 2020-09-14 ENCOUNTER — Encounter: Payer: Self-pay | Admitting: Internal Medicine

## 2020-09-14 NOTE — Patient Instructions (Signed)
Tobacco Use Disorder Tobacco use disorder (TUD) occurs when a person craves, seeks, and uses tobacco, regardless of the consequences. This disorder can cause problems with mental and physical health. It can affect your ability to have healthy relationships, and it can keep you from meeting your responsibilities at work, home, or school. Tobacco may be:  Smoked as a cigarette or cigar.  Inhaled using e-cigarettes.  Smoked in a pipe or hookah.  Chewed as smokeless tobacco.  Inhaled into the nostrils as snuff. Tobacco products contain a dangerous chemical called nicotine, which is very addictive. Nicotine triggers hormones that make the body feel stimulated and works on areas of the brain that make you feel good. These effects can make it hard for people to quit nicotine. Tobacco contains many other unsafe chemicals that can damage almost every organ in the body. Smoking tobacco also puts others in danger due to fire risk and possible health problems caused by breathing in secondhand smoke. What are the signs or symptoms? Symptoms of TUD may include:  Being unable to slow down or stop your tobacco use.  Spending an abnormal amount of time getting or using tobacco.  Craving tobacco. Cravings may last for up to 6 months after quitting.  Tobacco use that: ? Interferes with your work, school, or home life. ? Interferes with your personal and social relationships. ? Makes you give up activities that you once enjoyed or found important.  Using tobacco even though you know that it is: ? Dangerous or bad for your health or someone else's health. ? Causing problems in your life.  Needing more and more of the substance to get the same effect (developing tolerance).  Experiencing unpleasant symptoms if you do not use the substance (withdrawal). Withdrawal symptoms may include: ? Depressed, anxious, or irritable mood. ? Difficulty concentrating. ? Increased appetite. ? Restlessness or trouble  sleeping.  Using the substance to avoid withdrawal. How is this diagnosed? This condition may be diagnosed based on:  Your current and past tobacco use. Your health care provider may ask questions about how your tobacco use affects your life.  A physical exam. You may be diagnosed with TUD if you have at least two symptoms within a 12-month period. How is this treated? This condition is treated by stopping tobacco use. Many people are unable to quit on their own and need help. Treatment may include:  Nicotine replacement therapy (NRT). NRT provides nicotine without the other harmful chemicals in tobacco. NRT gradually lowers the dosage of nicotine in the body and reduces withdrawal symptoms. NRT is available as: ? Over-the-counter gums, lozenges, and skin patches. ? Prescription mouth inhalers and nasal sprays.  Medicine that acts on the brain to reduce cravings and withdrawal symptoms.  A type of talk therapy that examines your triggers for tobacco use, how to avoid them, and how to cope with cravings (behavioral therapy).  Hypnosis. This may help with withdrawal symptoms.  Joining a support group for others coping with TUD. The best treatment for TUD is usually a combination of medicine, talk therapy, and support groups. Recovery can be a long process. Many people start using tobacco again after stopping (relapse). If you relapse, it does not mean that treatment will not work. Follow these instructions at home:  Lifestyle  Do not use any products that contain nicotine or tobacco, such as cigarettes and e-cigarettes.  Avoid things that trigger tobacco use as much as you can. Triggers include people and situations that usually cause you   to use tobacco.  Avoid drinks that contain caffeine, including coffee. These may worsen some withdrawal symptoms.  Find ways to manage stress. Wanting to smoke may cause stress, and stress can make you want to smoke. Relaxation techniques such as  deep breathing, meditation, and yoga may help.  Attend support groups as needed. These groups are an important part of long-term recovery for many people. General instructions  Take over-the-counter and prescription medicines only as told by your health care provider.  Check with your health care provider before taking any new prescription or over-the-counter medicines.  Decide on a friend, family member, or smoking quit-line (such as 1-800-QUIT-NOW in the [place] that you can call or text when you feel the urge to smoke or when you need help coping with cravings.  Keep all follow-up visits as told by your health care provider and therapist. This is important. Contact a health care provider if:  You are not able to take your medicines as prescribed.  Your symptoms get worse, even with treatment. Summary  Tobacco use disorder (TUD) occurs when a person craves, seeks, and uses tobacco regardless of the consequences.  This condition may be diagnosed based on your current and past tobacco use and a physical exam.  Many people are unable to quit on their own and need help. Recovery can be a long process.  The most effective treatment for TUD is usually a combination of medicine, talk therapy, and support groups. This information is not intended to replace advice given to you by your health care provider. Make sure you discuss any questions you have with your health care provider. Document Revised: 10/15/2017 Document Reviewed: 10/15/2017 Elsevier Patient Education  2020 Elsevier Inc.  

## 2020-09-22 ENCOUNTER — Telehealth: Payer: Self-pay

## 2020-09-25 ENCOUNTER — Other Ambulatory Visit: Payer: Self-pay | Admitting: Hospice and Palliative Medicine

## 2020-09-25 MED ORDER — TRAZODONE HCL 50 MG PO TABS: 25.0000 mg | ORAL_TABLET | Freq: Every evening | ORAL | 3 refills | Status: DC | PRN

## 2020-09-25 NOTE — Telephone Encounter (Signed)
Patient willing to try trazadone

## 2020-10-04 ENCOUNTER — Other Ambulatory Visit: Payer: Self-pay | Admitting: Hospice and Palliative Medicine

## 2020-10-04 DIAGNOSIS — G4700 Insomnia, unspecified: Secondary | ICD-10-CM

## 2020-10-17 ENCOUNTER — Ambulatory Visit: Payer: BC Managed Care – PPO | Admitting: Internal Medicine

## 2020-10-19 ENCOUNTER — Ambulatory Visit: Payer: BC Managed Care – PPO | Admitting: Internal Medicine

## 2020-10-20 ENCOUNTER — Ambulatory Visit: Payer: BC Managed Care – PPO | Admitting: Nurse Practitioner

## 2020-10-25 ENCOUNTER — Other Ambulatory Visit: Payer: Self-pay | Admitting: Nurse Practitioner

## 2020-10-25 DIAGNOSIS — U071 COVID-19: Secondary | ICD-10-CM

## 2020-10-25 MED ORDER — PREDNISONE 10 MG (21) PO TBPK: ORAL_TABLET | ORAL | 0 refills | Status: DC

## 2020-10-25 MED ORDER — AZITHROMYCIN 250 MG PO TABS: ORAL_TABLET | ORAL | 0 refills | Status: DC

## 2020-12-14 ENCOUNTER — Other Ambulatory Visit: Payer: Self-pay | Admitting: Hospice and Palliative Medicine

## 2020-12-14 NOTE — Progress Notes (Signed)
Abnormal CT, needs pulmonary

## 2020-12-21 ENCOUNTER — Ambulatory Visit (INDEPENDENT_AMBULATORY_CARE_PROVIDER_SITE_OTHER): Payer: BC Managed Care – PPO | Admitting: Internal Medicine

## 2020-12-21 ENCOUNTER — Encounter: Payer: Self-pay | Admitting: Internal Medicine

## 2020-12-21 ENCOUNTER — Other Ambulatory Visit: Payer: Self-pay

## 2020-12-21 VITALS — BP 128/88 | HR 100 | Temp 97.7°F | Resp 16 | Ht 62.0 in | Wt 158.2 lb

## 2020-12-21 DIAGNOSIS — J9811 Atelectasis: Secondary | ICD-10-CM

## 2020-12-21 DIAGNOSIS — J449 Chronic obstructive pulmonary disease, unspecified: Secondary | ICD-10-CM

## 2020-12-21 DIAGNOSIS — F17200 Nicotine dependence, unspecified, uncomplicated: Secondary | ICD-10-CM | POA: Diagnosis not present

## 2020-12-21 DIAGNOSIS — T17500A Unspecified foreign body in bronchus causing asphyxiation, initial encounter: Secondary | ICD-10-CM

## 2020-12-21 NOTE — Progress Notes (Signed)
[facility], [place] [address]  Pulmonary Sleep Medicine   Office Visit Note  Patient Name: Stephanie Hess DOB: 10-28-1951 MRN [id number]  Date of Service: 12/21/2020  Complaints/HPI: MILD COPD still smoking 1/2 PPD she unfortunately continues to smoke cigarettes.  I spoke to her daughter at length as well as to the patient at length discussed the importance of smoking cessation.  Pulmonary functions show mild COPD.  In addition she does have some concern for interstitial lung disease and so CT scan with high-resolution cuts would be recommended as a follow-up.  I have also given her the option of going to [facility] for evaluation.  I think because she also has a daughter that may have interstitial lung disease it may be beneficial to get an opinion from the [place]  ROS  General: (-) fever, (-) chills, (-) night sweats, (-) weakness Skin: (-) rashes, (-) itching,. Eyes: (-) visual changes, (-) redness, (-) itching. Nose and Sinuses: (-) nasal stuffiness or itchiness, (-) postnasal drip, (-) nosebleeds, (-) sinus trouble. Mouth and Throat: (-) sore throat, (-) hoarseness. Neck: (-) swollen glands, (-) enlarged thyroid, (-) neck pain. Respiratory: + cough, (-) bloody sputum, + shortness of breath, - wheezing. Cardiovascular: - ankle swelling, (-) chest pain. Lymphatic: (-) lymph node enlargement. Neurologic: (-) numbness, (-) tingling. Psychiatric: (-) anxiety, (-) depression   Current Medication: Outpatient Encounter Medications as of 12/21/2020  Medication Sig  . aspirin EC 81 MG tablet Take 1 tablet (81 mg total) by mouth daily.  . Budeson-Glycopyrrol-Formoterol (BREZTRI AEROSPHERE) 160-9-4.8 MCG/ACT AERO Inhale into the lungs.  . calcium-vitamin D (OSCAL WITH D) 500-200 MG-UNIT tablet Take 1 tablet by mouth.  . ergocalciferol (VITAMIN D2) 1.25 MG (50000 UT) capsule ergocalciferol (vitamin D2) 1,250 mcg (50,000 unit) capsule   . gabapentin (NEURONTIN) 300 MG capsule Take 1 capsule (300 mg total) by mouth at bedtime.  Marland Kitchen HYDROcodone-acetaminophen (NORCO/VICODIN) 5-325 MG tablet Take 1 tablet by mouth every 6 (six) hours as needed for moderate pain.  Marland Kitchen ibuprofen (ADVIL,MOTRIN) 800 MG tablet Take 1 tablet (800 mg total) by mouth every 8 (eight) hours as needed.  Marland Kitchen omeprazole (PRILOSEC) 20 MG capsule Take 20 mg by mouth daily.  . OXYGEN Inhale into the lungs. 2 litre at night  . traZODone (DESYREL) 50 MG tablet TAKE 0.5-1 TABLETS BY MOUTH AT BEDTIME AS NEEDED FOR SLEEP.  Marland Kitchen valACYclovir (VALTREX) 1000 MG tablet Take 1 tablet (1,000 mg total) by mouth 2 (two) times daily.  Marland Kitchen azithromycin (ZITHROMAX) 250 MG tablet z-pack - take as directed for 5 days (Patient not taking: Reported on 12/21/2020)  . predniSONE (STERAPRED UNI-PAK 21 TAB) 10 MG (21) TBPK tablet 6 day taper - take by mouth as directed for 6 days (Patient not taking: Reported on 12/21/2020)   No facility-administered encounter medications on file as of 12/21/2020.    Surgical History: Past Surgical History:  Procedure Laterality Date  . APPENDECTOMY    . CESAREAN SECTION    . ECTOPIC PREGNANCY SURGERY      Medical History: Past Medical History:  Diagnosis Date  . Cancer ([place])   . COPD (chronic obstructive pulmonary disease) ([place])   . Diverticula of colon   . GERD (gastroesophageal reflux disease)   . Hyperlipidemia   . Renal insufficiency    2009 or 2010    Family History: Family History  Problem Relation Age of Onset  . Breast cancer Paternal Aunt   . Heart attack  Mother     Social History: Social History   Socioeconomic History  . Marital status: Divorced    Spouse name: Not on file  . Number of children: Not on file  . Years of education: Not on file  . Highest education level: Not on file  Occupational History  . Not on file  Tobacco Use  . Smoking status: Current Every Day Smoker    Packs/day: 0.50    Years: 50.00    Pack  years: 25.00    Types: Cigarettes  . Smokeless tobacco: Never Used  Vaping Use  . Vaping Use: Never used  Substance and Sexual Activity  . Alcohol use: Yes    Comment: social  . Drug use: No  . Sexual activity: Not Currently  Other Topics Concern  . Not on file  Social History Narrative  . Not on file   Social Determinants of Health   Financial Resource Strain: Not on file  Food Insecurity: Not on file  Transportation Needs: Not on file  Physical Activity: Not on file  Stress: Not on file  Social Connections: Not on file  Intimate Partner Violence: Not on file    Vital Signs: Blood pressure 128/88, pulse 100, temperature 97.7 F (36.5 C), resp. rate 16, height 5\' 2"  (1.575 m), weight 158 lb 3.2 oz (71.8 kg), SpO2 98 %.  Examination: General Appearance: The patient is well-developed, well-nourished, and in no distress. Skin: Gross inspection of skin unremarkable. Head: normocephalic, no gross deformities. Eyes: no gross deformities noted. ENT: ears appear grossly normal no exudates. Neck: Supple. No thyromegaly. No LAD. Respiratory: Scattered rhonchi are noted bilaterally. Cardiovascular: Normal S1 and S2 without murmur or rub. Extremities: No cyanosis. pulses are equal. Neurologic: Alert and oriented. No involuntary movements.  LABS: No results found for this or any previous visit (from the past 2160 hour(s)).  Radiology: DG Chest 2 View  Result Date: 08/23/2020 CLINICAL DATA:  Cough, smoker. Nicotine dependence with current use. EXAM: CHEST - 2 VIEW COMPARISON:  Radiograph 12/14/2018, chest CT 07/16/2018 FINDINGS: Borderline hyperinflation. There is bronchial thickening that is similar to prior exam. Streaky opacities in the right infrahilar lung. The heart is normal in size. Normal mediastinal contours. No evidence of pulmonary mass. No pneumothorax, pulmonary edema, or pleural effusion. No acute osseous abnormalities are seen. IMPRESSION: Right infrahilar streaky  opacities, favor atelectasis. Mucous plugging with seen in this region on prior CT. Background bronchial thickening, unchanged from prior exam. Electronically Signed   By: Keith Rake M.D.   On: 08/23/2020 16:01    No results found.  No results found.    Assessment and Plan: Patient Active Problem List   Diagnosis Date Noted  . 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 Left upper arm pain 02/13/2020  . Acute upper respiratory infection 12/20/2018  . Sore throat 12/20/2018  . Fever and chills 12/20/2018  . Hemoptysis 12/20/2018  . Urinary tract infection without hematuria 11/25/2018  . Dysuria 11/25/2018  . Respiratory distress 07/16/2018  . Sternoclavicular joint pain 04/01/2018  . Bronchiectasis with acute lower respiratory infection ([place]) 03/03/2018  . Nicotine abuse 03/03/2018  . Mild intermittent asthma without complication 25/42/7062  . Pulmonary nodule 03/03/2018  . Gastroesophageal reflux disease without esophagitis 03/03/2018  . Hyperlipidemia 05/15/2017  . Vitamin D deficiency 05/15/2017     1. Chronic obstructive pulmonary disease, unspecified COPD type ([place]) Currently is on bretri inhaler. Some improvement. Unfortunately she is still smoking and needs to stop immediately. COPD with MILD  changes noted on the PFT last done  2. Nicotine dependence with current use Smoking cessation counseling provided again  3. Atelectasis Noted on last CT due to plugs. Concern for further scarring and worsening noted will get - CT Chest High Resolution; Future  4. Mucus plugging of bronchi Ordered a CT scan but again I think related to underlying chronic bronchitis with ongoing smoking as a contributing factor. - CT Chest High Resolution; Future    General Counseling: I have discussed the findings of the evaluation and examination with Tamra.  I have also discussed any further diagnostic evaluation thatmay be needed or ordered today. Devynne verbalizes  understanding of the findings of todays visit. We also reviewed her medications today and discussed drug interactions and side effects including but not limited excessive drowsiness and altered mental states. We also discussed that there is always a risk not just to her but also people around her. she has been encouraged to call the office with any questions or concerns that should arise related to todays visit.  No orders of the defined types were placed in this encounter.    Time spent: 35 minutes  I have personally obtained a history, examined the patient, evaluated laboratory and imaging results, formulated the assessment and plan and placed orders.    Allyne Gee, MD [facility] Pulmonary and Critical Care Sleep medicine

## 2020-12-21 NOTE — Patient Instructions (Signed)
Chronic Obstructive Pulmonary Disease  Chronic obstructive pulmonary disease (COPD) is a long-term (chronic) lung problem. When you have COPD, it is hard for air to get in and out of your lungs. Usually the condition gets worse over time, and your lungs will never return to normal. There are things you can do to keep yourself as healthy as possible. What are the causes?  Smoking. This is the most common cause.  Certain genes passed from parent to child (inherited). What increases the risk?  Being exposed to secondhand smoke from cigarettes, pipes, or cigars.  Being exposed to chemicals and other irritants, such as fumes and dust in the work environment.  Having chronic lung conditions or infections. What are the signs or symptoms?  Shortness of breath, especially during physical activity.  A long-term cough with a large amount of thick mucus. Sometimes, the cough may not have any mucus (dry cough).  Wheezing.  Breathing quickly.  Skin that looks gray or blue, especially in the fingers, toes, or lips.  Feeling tired (fatigue).  Weight loss.  Chest tightness.  Having infections often.  Episodes when breathing symptoms become much worse (exacerbations). At the later stages of this disease, you may have swelling in the ankles, feet, or legs. How is this treated?  Taking medicines.  Quitting smoking, if you smoke.  Rehabilitation. This includes steps to make your body work better. It may involve a team of specialists.  Doing exercises.  Making changes to your diet.  Using oxygen.  Lung surgery.  Lung transplant.  Comfort measures (palliative care). Follow these instructions at home: Medicines  Take over-the-counter and prescription medicines only as told by your doctor.  Talk to your doctor before taking any cough or allergy medicines. You may need to avoid medicines that cause your lungs to be dry. Lifestyle  If you smoke, stop smoking. Smoking makes the  problem worse.  Do not smoke or use any products that contain nicotine or tobacco. If you need help quitting, ask your doctor.  Avoid being around things that make your breathing worse. This may include smoke, chemicals, and fumes.  Stay active, but remember to rest as well.  Learn and use tips on how to manage stress and control your breathing.  Make sure you get enough sleep. Most adults need at least 7 hours of sleep every night.  Eat healthy foods. Eat smaller meals more often. Rest before meals. Controlled breathing Learn and use tips on how to control your breathing as told by your doctor. Try:  Breathing in (inhaling) through your nose for 1 second. Then, pucker your lips and breath out (exhale) through your lips for 2 seconds.  Putting one hand on your belly (abdomen). Breathe in slowly through your nose for 1 second. Your hand on your belly should move out. Pucker your lips and breathe out slowly through your lips. Your hand on your belly should move in as you breathe out.   Controlled coughing Learn and use controlled coughing to clear mucus from your lungs. Follow these steps: 1. Lean your head a little forward. 2. Breathe in deeply. 3. Try to hold your breath for 3 seconds. 4. Keep your mouth slightly open while coughing 2 times. 5. Spit any mucus out into a tissue. 6. Rest and do the steps again 1 or 2 times as needed. General instructions  Make sure you get all the shots (vaccines) that your doctor recommends. Ask your doctor about a flu shot and a pneumonia shot.    Use oxygen therapy and pulmonary rehabilitation if told by your doctor. If you need home oxygen therapy, ask your doctor if you should buy a tool to measure your oxygen level (oximeter).  Make a COPD action plan with your doctor. This helps you to know what to do if you feel worse than usual.  Manage any other conditions you have as told by your doctor.  Avoid going outside when it is very hot, cold, or  humid.  Avoid people who have a sickness you can catch (contagious).  Keep all follow-up visits. Contact a doctor if:  You cough up more mucus than usual.  There is a change in the color or thickness of the mucus.  It is harder to breathe than usual.  Your breathing is faster than usual.  You have trouble sleeping.  You need to use your medicines more often than usual.  You have trouble doing your normal activities such as getting dressed or walking around the house. Get help right away if:  You have shortness of breath while resting.  You have shortness of breath that stops you from: ? Being able to talk. ? Doing normal activities.  Your chest hurts for longer than 5 minutes.  Your skin color is more blue than usual.  Your pulse oximeter shows that you have low oxygen for longer than 5 minutes.  You have a fever.  You feel too tired to breathe normally. These symptoms may represent a serious problem that is an emergency. Do not wait to see if the symptoms will go away. Get medical help right away. Call your local emergency services (911 in the [place] Do not drive yourself to the hospital. Summary  Chronic obstructive pulmonary disease (COPD) is a long-term lung problem.  The way your lungs work will never return to normal. Usually the condition gets worse over time. There are things you can do to keep yourself as healthy as possible.  Take over-the-counter and prescription medicines only as told by your doctor.  If you smoke, stop. Smoking makes the problem worse. This information is not intended to replace advice given to you by your health care provider. Make sure you discuss any questions you have with your health care provider. Document Revised: 09/05/2020 Document Reviewed: 09/05/2020 Elsevier Patient Education  2021 [employer].   

## 2021-01-09 ENCOUNTER — Other Ambulatory Visit: Payer: Self-pay

## 2021-01-09 ENCOUNTER — Ambulatory Visit
Admission: RE | Admit: 2021-01-09 | Discharge: 2021-01-09 | Disposition: A | Discharge status: Home / Self Care | Payer: BC Managed Care – PPO | Source: Ambulatory Visit | Attending: Internal Medicine | Admitting: Internal Medicine

## 2021-01-09 DIAGNOSIS — T17500A Unspecified foreign body in bronchus causing asphyxiation, initial encounter: Secondary | ICD-10-CM | POA: Diagnosis present

## 2021-01-09 DIAGNOSIS — J9811 Atelectasis: Secondary | ICD-10-CM | POA: Diagnosis not present

## 2021-01-18 ENCOUNTER — Encounter: Payer: Self-pay | Admitting: Hospice and Palliative Medicine

## 2021-01-18 ENCOUNTER — Telehealth: Payer: Self-pay

## 2021-01-18 ENCOUNTER — Ambulatory Visit (INDEPENDENT_AMBULATORY_CARE_PROVIDER_SITE_OTHER): Payer: BC Managed Care – PPO | Admitting: Hospice and Palliative Medicine

## 2021-01-18 VITALS — BP 137/75 | HR 96 | Temp 97.6°F | Resp 16 | Ht 62.0 in | Wt 157.8 lb

## 2021-01-18 DIAGNOSIS — R233 Spontaneous ecchymoses: Secondary | ICD-10-CM | POA: Diagnosis not present

## 2021-01-18 DIAGNOSIS — R5383 Other fatigue: Secondary | ICD-10-CM

## 2021-01-18 DIAGNOSIS — J449 Chronic obstructive pulmonary disease, unspecified: Secondary | ICD-10-CM | POA: Diagnosis not present

## 2021-01-18 DIAGNOSIS — G4734 Idiopathic sleep related nonobstructive alveolar hypoventilation: Secondary | ICD-10-CM | POA: Diagnosis not present

## 2021-01-18 DIAGNOSIS — B0223 Postherpetic polyneuropathy: Secondary | ICD-10-CM

## 2021-01-18 DIAGNOSIS — G2581 Restless legs syndrome: Secondary | ICD-10-CM

## 2021-01-18 MED ORDER — ALBUTEROL SULFATE HFA 108 (90 BASE) MCG/ACT IN AERS: 2.0000 | INHALATION_SPRAY | Freq: Four times a day (QID) | RESPIRATORY_TRACT | 0 refills | Status: DC | PRN

## 2021-01-18 MED ORDER — TRELEGY ELLIPTA 100-62.5-25 MCG/INH IN AEPB: INHALATION_SPRAY | RESPIRATORY_TRACT | 2 refills | Status: DC

## 2021-01-18 MED ORDER — ROPINIROLE HCL 0.5 MG PO TABS
0.5000 mg | ORAL_TABLET | Freq: Every day | ORAL | 0 refills | Status: DC
Start: 2021-01-18 — End: 2021-02-12

## 2021-01-18 NOTE — Progress Notes (Signed)
[facility] [place], [place] [address]  Internal MEDICINE  Office Visit Note  Patient Name: Stephanie Hess  [id number]  [id number]  Date of Service: 01/20/2021  Chief Complaint  Patient presents with  . Bleeding/Bruising    Bruising easily; no injury - mainly on arms     HPI Pt is here for a sick visit. Few non specific complaints today 1. Insomnia, was unable to tolerate Remeron from last visit--has started back taking NyQuil to help with sleep Has trouble with both falling and staying asleep at night 2. Bruising with unknown injury, has noticed two bruises on her arms, first noticed a few weeks ago and cannot recall any recent injury that could have caused the bruising  Overall does not feel well and is tired of not feeling well, lacks energy  History of COPD and nocturnal hypoxia, using 2LPM supplemental at night and Breztri as needed--followed by pulmonary  Current Medication:  Outpatient Encounter Medications as of 01/18/2021  Medication Sig  . albuterol (VENTOLIN HFA) 108 (90 Base) MCG/ACT inhaler Inhale 2 puffs into the lungs every 6 (six) hours as needed for wheezing or shortness of breath.  Marland Kitchen aspirin EC 81 MG tablet Take 1 tablet (81 mg total) by mouth daily.  . calcium-vitamin D (OSCAL WITH D) 500-200 MG-UNIT tablet Take 1 tablet by mouth.  . ergocalciferol (VITAMIN D2) 1.25 MG (50000 UT) capsule ergocalciferol (vitamin D2) 1,250 mcg (50,000 unit) capsule  . Fluticasone-Umeclidin-Vilant (TRELEGY ELLIPTA) 100-62.5-25 MCG/INH AEPB Inhale one puff once a day.  Marland Kitchen HYDROcodone-acetaminophen (NORCO/VICODIN) 5-325 MG tablet Take 1 tablet by mouth every 6 (six) hours as needed for moderate pain.  Marland Kitchen ibuprofen (ADVIL,MOTRIN) 800 MG tablet Take 1 tablet (800 mg total) by mouth every 8 (eight) hours as needed.  Marland Kitchen omeprazole (PRILOSEC) 20 MG capsule Take 20 mg by mouth daily.  . OXYGEN Inhale into the lungs. 2 litre at night  . rOPINIRole (REQUIP) 0.5  MG tablet Take 1 tablet (0.5 mg total) by mouth at bedtime.  . [DISCONTINUED] Budeson-Glycopyrrol-Formoterol (BREZTRI AEROSPHERE) 160-9-4.8 MCG/ACT AERO Inhale into the lungs.  . [DISCONTINUED] traZODone (DESYREL) 50 MG tablet TAKE 0.5-1 TABLETS BY MOUTH AT BEDTIME AS NEEDED FOR SLEEP.  Marland Kitchen predniSONE (STERAPRED UNI-PAK 21 TAB) 10 MG (21) TBPK tablet 6 day taper - take by mouth as directed for 6 days (Patient not taking: Reported on 01/18/2021)  . [DISCONTINUED] azithromycin (ZITHROMAX) 250 MG tablet z-pack - take as directed for 5 days (Patient not taking: Reported on 01/18/2021)  . [DISCONTINUED] gabapentin (NEURONTIN) 300 MG capsule Take 1 capsule (300 mg total) by mouth at bedtime. (Patient not taking: Reported on 01/18/2021)  . [DISCONTINUED] valACYclovir (VALTREX) 1000 MG tablet Take 1 tablet (1,000 mg total) by mouth 2 (two) times daily. (Patient not taking: Reported on 01/18/2021)   No facility-administered encounter medications on file as of 01/18/2021.      Medical History: Past Medical History:  Diagnosis Date  . Cancer ([place])   . COPD (chronic obstructive pulmonary disease) ([place])   . Diverticula of colon   . GERD (gastroesophageal reflux disease)   . Hyperlipidemia   . Renal insufficiency    2009 or 2010     Vital Signs: BP 137/75   Pulse 96   Temp 97.6 F (36.4 C)   Resp 16   Ht 5\' 2"  (1.575 m)   Wt 157 lb 12.8 oz (71.6 kg)   SpO2 95%   BMI 28.86 kg/m    Review  of Systems  Constitutional: Positive for fatigue. Negative for chills and diaphoresis.  HENT: Negative for ear pain, postnasal drip and sinus pressure.   Eyes: Negative for photophobia, discharge, redness, itching and visual disturbance.  Respiratory: Negative for cough, shortness of breath and wheezing.   Cardiovascular: Negative for chest pain, palpitations and leg swelling.  Gastrointestinal: Negative for abdominal pain, constipation, diarrhea, nausea and vomiting.  Genitourinary: Negative for dysuria and  flank pain.  Musculoskeletal: Negative for arthralgias, back pain, gait problem and neck pain.  Skin: Negative for color change.       Bruising  Allergic/Immunologic: Negative for environmental allergies and food allergies.  Neurological: Negative for dizziness and headaches.  Hematological: Bruises/bleeds easily.  Psychiatric/Behavioral: Positive for behavioral problems (depression) and sleep disturbance. Negative for agitation and hallucinations. The patient is nervous/anxious.     Physical Exam Vitals reviewed.  Constitutional:      Appearance: Normal appearance. She is normal weight.  Cardiovascular:     Rate and Rhythm: Normal rate and regular rhythm.     Pulses: Normal pulses.     Heart sounds: Normal heart sounds.  Pulmonary:     Effort: Pulmonary effort is normal.     Breath sounds: Normal breath sounds.  Abdominal:     General: Abdomen is flat.     Palpations: Abdomen is soft.  Musculoskeletal:        General: Normal range of motion.     Cervical back: Normal range of motion.  Skin:    General: Skin is warm.  Neurological:     General: No focal deficit present.     Mental Status: She is alert and oriented to person, place, and time. Mental status is at baseline.  Psychiatric:        Mood and Affect: Mood normal.        Behavior: Behavior normal.        Thought Content: Thought content normal.        Judgment: Judgment normal.    Assessment/Plan: 1. Bruising, spontaneous Will review labs and adjust care accordingly - CBC w/Diff/Platelet - Vitamin B12 - Folate - Iron and TIBC - Ferritin  2. Nocturnal hypoxia Update ONO--wear currently prescribed 2LPM Consider PSG - Pulse oximetry, overnight; Future  3. Chronic obstructive pulmonary disease, unspecified COPD type ([place]) Stop Breztri and start Trelegy--advised to use daily Albuterol inhaler for as needed, rescue inhaler use - Fluticasone-Umeclidin-Vilant (TRELEGY ELLIPTA) 100-62.5-25 MCG/INH AEPB; Inhale  one puff once a day.  Dispense: 1 each; Refill: 2 - albuterol (VENTOLIN HFA) 108 (90 Base) MCG/ACT inhaler; Inhale 2 puffs into the lungs every 6 (six) hours as needed for wheezing or shortness of breath.  Dispense: 8 g; Refill: 0  4. RLS (restless legs syndrome) Start ropinirole for RLS possibly contributing to insomnia Will review iron studies - rOPINIRole (REQUIP) 0.5 MG tablet; Take 1 tablet (0.5 mg total) by mouth at bedtime.  Dispense: 30 tablet; Refill: 0 - Iron and TIBC - Ferritin  5. Other fatigue - CBC w/Diff/Platelet - Comprehensive Metabolic Panel (CMET) - Lipid Panel With LDL/HDL Ratio - TSH + free T4 - Vitamin B12 - Folate - Iron and TIBC - Ferritin  General Counseling: Donye verbalizes understanding of the findings of todays visit and agrees with plan of treatment. I have discussed any further diagnostic evaluation that may be needed or ordered today. We also reviewed her medications today. she has been encouraged to call the office with any questions or concerns that should arise related to  todays visit.   Orders Placed This Encounter  Procedures  . CBC w/Diff/Platelet  . Comprehensive Metabolic Panel (CMET)  . Lipid Panel With LDL/HDL Ratio  . TSH + free T4  . Vitamin B12  . Folate  . Iron and TIBC  . Ferritin  . Pulse oximetry, overnight    Meds ordered this encounter  Medications  . rOPINIRole (REQUIP) 0.5 MG tablet    Sig: Take 1 tablet (0.5 mg total) by mouth at bedtime.    Dispense:  30 tablet    Refill:  0  . Fluticasone-Umeclidin-Vilant (TRELEGY ELLIPTA) 100-62.5-25 MCG/INH AEPB    Sig: Inhale one puff once a day.    Dispense:  1 each    Refill:  2  . albuterol (VENTOLIN HFA) 108 (90 Base) MCG/ACT inhaler    Sig: Inhale 2 puffs into the lungs every 6 (six) hours as needed for wheezing or shortness of breath.    Dispense:  8 g    Refill:  0    Time spent: 30 Minutes Time spent includes review of chart, medications, test results and  follow-up plan with the patient.  This patient was seen by Theodoro Grist AGNP-C in Collaboration with Dr Lavera Guise as a part of collaborative care agreement.  Tanna Furry [facility] Internal Medicine

## 2021-01-18 NOTE — Telephone Encounter (Signed)
Sent pt for pulse oximetry, overnight  At [facility] [facility] Patient JS

## 2021-01-20 ENCOUNTER — Encounter: Payer: Self-pay | Admitting: Hospice and Palliative Medicine

## 2021-01-20 LAB — COMPREHENSIVE METABOLIC PANEL
ALT: 36 IU/L — ABNORMAL HIGH (ref 0–32)
AST: 28 IU/L (ref 0–40)
Albumin/Globulin Ratio: 1.5 (ref 1.2–2.2)
Albumin: 4.6 g/dL (ref 3.8–4.8)
Alkaline Phosphatase: 73 IU/L (ref 44–121)
BUN/Creatinine Ratio: 14 (ref 12–28)
BUN: 10 mg/dL (ref 8–27)
Bilirubin Total: 0.3 mg/dL (ref 0.0–1.2)
CO2: 19 mmol/L — ABNORMAL LOW (ref 20–29)
Calcium: 10.1 mg/dL (ref 8.7–10.3)
Chloride: 106 mmol/L (ref 96–106)
Creatinine, Ser: 0.74 mg/dL (ref 0.57–1.00)
Globulin, Total: 3.1 g/dL (ref 1.5–4.5)
Glucose: 93 mg/dL (ref 65–99)
Potassium: 4.5 mmol/L (ref 3.5–5.2)
Sodium: 142 mmol/L (ref 134–144)
Total Protein: 7.7 g/dL (ref 6.0–8.5)
eGFR: 88 mL/min/{1.73_m2} (ref >59)

## 2021-01-20 LAB — IRON AND TIBC
Iron Saturation: 29 % (ref 15–55)
Iron: 107 ug/dL (ref 27–139)
Total Iron Binding Capacity: 367 ug/dL (ref 250–450)
UIBC: 260 ug/dL (ref 118–369)

## 2021-01-20 LAB — TSH+FREE T4
Free T4: 0.87 ng/dL (ref 0.82–1.77)
TSH: 1.51 u[IU]/mL (ref 0.450–4.500)

## 2021-01-20 LAB — CBC WITH DIFFERENTIAL/PLATELET
Basophils Absolute: 0.1 10*3/uL (ref 0.0–0.2)
Basos: 1 %
EOS (ABSOLUTE): 0.4 10*3/uL (ref 0.0–0.4)
Eos: 5 %
Hematocrit: 41 % (ref 34.0–46.6)
Hemoglobin: 13.7 g/dL (ref 11.1–15.9)
Immature Grans (Abs): 0 10*3/uL (ref 0.0–0.1)
Immature Granulocytes: 0 %
Lymphocytes Absolute: 4.5 10*3/uL — ABNORMAL HIGH (ref 0.7–3.1)
Lymphs: 48 %
MCH: 30 pg (ref 26.6–33.0)
MCHC: 33.4 g/dL (ref 31.5–35.7)
MCV: 90 fL (ref 79–97)
Monocytes Absolute: 0.7 10*3/uL (ref 0.1–0.9)
Monocytes: 8 %
Neutrophils Absolute: 3.5 10*3/uL (ref 1.4–7.0)
Neutrophils: 38 %
Platelets: 307 10*3/uL (ref 150–450)
RBC: 4.56 x10E6/uL (ref 3.77–5.28)
RDW: 13.1 % (ref 11.7–15.4)
WBC: 9.2 10*3/uL (ref 3.4–10.8)

## 2021-01-20 LAB — LIPID PANEL WITH LDL/HDL RATIO
Cholesterol, Total: 278 mg/dL — ABNORMAL HIGH (ref 100–199)
HDL: 41 mg/dL (ref >39)
LDL Chol Calc (NIH): 180 mg/dL — ABNORMAL HIGH (ref 0–99)
LDL/HDL Ratio: 4.4 ratio — ABNORMAL HIGH (ref 0.0–3.2)
Triglycerides: 294 mg/dL — ABNORMAL HIGH (ref 0–149)
VLDL Cholesterol Cal: 57 mg/dL — ABNORMAL HIGH (ref 5–40)

## 2021-01-20 LAB — FOLATE: Folate: 12.2 ng/mL (ref >3.0)

## 2021-01-20 LAB — VITAMIN B12: Vitamin B-12: 218 pg/mL — ABNORMAL LOW (ref 232–1245)

## 2021-01-20 LAB — FERRITIN: Ferritin: 59 ng/mL (ref 15–150)

## 2021-01-22 ENCOUNTER — Telehealth: Payer: Self-pay

## 2021-01-22 NOTE — Progress Notes (Signed)
Please call and let her know that her vitamin B12 levels are low, we can get her started on injections. Thanks. I will discuss other labs at her follow-up visit.

## 2021-01-22 NOTE — Telephone Encounter (Signed)
-----   Message from Luiz Ochoa, NP sent at 01/22/2021  8:38 AM EDT ----- Please call and let her know that her vitamin B12 levels are low, we can get her started on injections. Thanks. I will discuss other labs at her follow-up visit.

## 2021-01-22 NOTE — Telephone Encounter (Signed)
Pt.notified

## 2021-01-23 ENCOUNTER — Other Ambulatory Visit: Payer: Self-pay

## 2021-01-23 ENCOUNTER — Ambulatory Visit (INDEPENDENT_AMBULATORY_CARE_PROVIDER_SITE_OTHER): Payer: BC Managed Care – PPO

## 2021-01-23 DIAGNOSIS — E538 Deficiency of other specified B group vitamins: Secondary | ICD-10-CM

## 2021-01-24 DIAGNOSIS — E538 Deficiency of other specified B group vitamins: Secondary | ICD-10-CM | POA: Diagnosis not present

## 2021-01-24 MED ORDER — CYANOCOBALAMIN 1000 MCG/ML IJ SOLN
1000.0000 ug | Freq: Once | INTRAMUSCULAR | Status: AC
Start: 2021-01-24 — End: 2021-01-24
  Administered 2021-01-24: 1000 ug via INTRAMUSCULAR

## 2021-01-26 ENCOUNTER — Ambulatory Visit: Payer: Self-pay | Admitting: Urology

## 2021-01-30 ENCOUNTER — Encounter: Payer: Self-pay | Admitting: Internal Medicine

## 2021-01-30 ENCOUNTER — Other Ambulatory Visit: Payer: Self-pay

## 2021-01-30 ENCOUNTER — Ambulatory Visit (INDEPENDENT_AMBULATORY_CARE_PROVIDER_SITE_OTHER): Payer: BC Managed Care – PPO

## 2021-01-30 ENCOUNTER — Ambulatory Visit: Payer: BC Managed Care – PPO | Admitting: Internal Medicine

## 2021-01-30 VITALS — BP 120/80 | HR 96 | Temp 97.7°F | Resp 16 | Ht 62.0 in | Wt 156.0 lb

## 2021-01-30 DIAGNOSIS — J449 Chronic obstructive pulmonary disease, unspecified: Secondary | ICD-10-CM | POA: Diagnosis not present

## 2021-01-30 DIAGNOSIS — I2510 Atherosclerotic heart disease of native coronary artery without angina pectoris: Secondary | ICD-10-CM

## 2021-01-30 DIAGNOSIS — E538 Deficiency of other specified B group vitamins: Secondary | ICD-10-CM | POA: Diagnosis not present

## 2021-01-30 DIAGNOSIS — J849 Interstitial pulmonary disease, unspecified: Secondary | ICD-10-CM

## 2021-01-30 DIAGNOSIS — F17200 Nicotine dependence, unspecified, uncomplicated: Secondary | ICD-10-CM | POA: Diagnosis not present

## 2021-01-30 DIAGNOSIS — R0602 Shortness of breath: Secondary | ICD-10-CM

## 2021-01-30 DIAGNOSIS — I2584 Coronary atherosclerosis due to calcified coronary lesion: Secondary | ICD-10-CM

## 2021-01-30 MED ORDER — CYANOCOBALAMIN 1000 MCG/ML IJ SOLN
1000.0000 ug | Freq: Once | INTRAMUSCULAR | Status: AC
  Administered 2021-01-30: 1000 ug via INTRAMUSCULAR

## 2021-01-30 NOTE — Patient Instructions (Signed)
Pulmonary Fibrosis  Pulmonary fibrosis is a type of lung disease that causes scarring. Over time, the scar tissue builds up in the air sacs of your lungs (alveoli). This makes it hard for you to breathe. Less oxygen can get into your blood. Scarring from pulmonary fibrosis gets worse over time. This damage is permanent and may lead to other serious health problems. What are the causes? There are many different causes of pulmonary fibrosis. Sometimes the cause is not known. This is called idiopathic pulmonary fibrosis. Other causes include:  Exposure to chemicals and substances found in agricultural, farm, Architect, or factory work. These include mold, asbestos, silica, metal dusts, and toxic fumes.  Sarcoidosis. In this disease, areas of inflammatory cells (granulomas) form and most often affect the lungs.  Autoimmune diseases. These include diseases such as rheumatoid arthritis, systemic sclerosis, or connective tissue disease.  Taking certain medicines. These include drugs used in radiation therapy or used to treat seizures, heart problems, and some infections. What increases the risk? You are more likely to develop this condition if:  You have a family history of the disease.  You are older. The condition is more common in older adults.  You have a history of smoking.  You have a job that exposes you to certain chemicals.  You have gastroesophageal reflux disease (GERD). What are the signs or symptoms? Symptoms of this condition include:  Difficulty breathing that gets worse with activity.  Shortness of breath (dyspnea).  Dry, hacking cough.  Rapid, shallow breathing during exercise or while at rest.  Bluish skin and lips.  Loss of appetite.  Weakness.  Weight loss and fatigue.  Rounded and enlarged fingertips (clubbing). How is this diagnosed? This condition may be diagnosed based on:  Your symptoms and medical history.  A physical exam. You may also have  tests, including:  A test that involves looking inside your lungs with an instrument (bronchoscopy).  Imaging studies of your lungs and heart.  Tests to measure how well you are breathing (pulmonary function tests).  Blood tests.  Tests to see how well your lungs work while you are walking (pulmonary stress test).  A procedure to remove a lung tissue sample to look at it under a microscope (biopsy). How is this treated? There is no cure for pulmonary fibrosis. Treatment focuses on managing symptoms and preventing scarring from getting worse. This may include:  Medicines, such as: ? Steroids to prevent permanent lung changes. ? Medicines to suppress your body's defense system (immune system). ? Medicines to help with lung function by reducing inflammation or scarring.  Ongoing monitoring with X-rays and lab work.  Oxygen therapy.  Pulmonary rehabilitation.  Surgery. In some cases, a lung transplant is possible. Follow these instructions at home: Medicines  Take over-the-counter and prescription medicines only as told by your health care provider.  Keep your vaccinations up to date as recommended by your health care provider. General instructions  Do not use any products that contain nicotine or tobacco, such as cigarettes and e-cigarettes. If you need help quitting, ask your health care provider.  Get regular exercise, but do not overexert yourself. Ask your health care provider to suggest some activities that are safe for you to do. ? If you have physical limitations, you may get exercise by walking, using a stationary bike, or doing chair exercises. ? Ask your health care provider about using oxygen while exercising.  If you are exposed to chemicals and substances at work, make sure that you  wear a mask or respirator at all times.  Join a pulmonary rehabilitation program or a support group for people with pulmonary fibrosis.  Eat small meals often so you do not get too  full. Overeating can make breathing trouble worse.  Maintain a healthy weight. Lose weight if you need to.  Do breathing exercises as directed by your health care provider.  Keep all follow-up visits as told by your health care provider. This is important.      Contact a health care provider if you:  Have symptoms that do not get better with medicines.  Are not able to be as active as usual.  Have trouble taking a deep breath.  Have a fever or chills.  Have blue lips or skin.  Have clubbing of your fingers. Get help right away if you:  Have a sudden worsening of your symptoms.  Have chest pain.  Cough up mucus that is dark in color.  Have a lot of headaches.  Get very confused or sleepy. Summary  Pulmonary fibrosis is a type of lung disease that causes scar tissue to build up in the air sacs of your lungs (alveoli) over time. Less oxygen can get into your blood. This makes it hard for you to breathe.  Scarring from pulmonary fibrosis gets worse over time. This damage is permanent and may lead to other serious health problems.  You are more likely to develop this condition if you have a family history of the condition or a job that exposes you to certain chemicals.  There is no cure for pulmonary fibrosis. Treatment focuses on managing symptoms and preventing scarring from getting worse. This information is not intended to replace advice given to you by your health care provider. Make sure you discuss any questions you have with your health care provider. Document Revised: 12/03/2017 Document Reviewed: 12/03/2017 Elsevier Patient Education  2021 [employer].

## 2021-01-30 NOTE — Progress Notes (Signed)
[facility] [place], [place] [address]  Pulmonary Sleep Medicine   Office Visit Note  Patient Name: Stephanie Hess DOB: [birth year] MRN [id number]  Date of Service: 01/30/2021  Complaints/HPI: CT chest CAD Smoker she is here for follow-up of CT scan of the chest.  The CT scan identifies some areas of fibrotic lung disease.  There is some concern that this fibrotic lung disease runs in the family her daughter also is being seen at [facility] for pulmonary fibrosis.  She to wants to be evaluated in [facility] and I think that that is the best option at this time.  I spoke to her at as far as her treatment options are concerned and right now she is not interested in any type of medical intervention.  ROS  General: (-) fever, (-) chills, (-) night sweats, (-) weakness Skin: (-) rashes, (-) itching,. Eyes: (-) visual changes, (-) redness, (-) itching. Nose and Sinuses: (-) nasal stuffiness or itchiness, (-) postnasal drip, (-) nosebleeds, (-) sinus trouble. Mouth and Throat: (-) sore throat, (-) hoarseness. Neck: (-) swollen glands, (-) enlarged thyroid, (-) neck pain. Respiratory: + cough, (-) bloody sputum, + shortness of breath, - wheezing. Cardiovascular: - ankle swelling, (-) chest pain. Lymphatic: (-) lymph node enlargement. Neurologic: (-) numbness, (-) tingling. Psychiatric: (-) anxiety, (-) depression   Current Medication: Outpatient Encounter Medications as of 01/30/2021  Medication Sig  . albuterol (VENTOLIN HFA) 108 (90 Base) MCG/ACT inhaler Inhale 2 puffs into the lungs every 6 (six) hours as needed for wheezing or shortness of breath.  Marland Kitchen aspirin EC 81 MG tablet Take 1 tablet (81 mg total) by mouth daily.  . calcium-vitamin D (OSCAL WITH D) 500-200 MG-UNIT tablet Take 1 tablet by mouth.  . ergocalciferol (VITAMIN D2) 1.25 MG (50000 UT) capsule ergocalciferol (vitamin D2) 1,250 mcg (50,000 unit) capsule  . Fluticasone-Umeclidin-Vilant (TRELEGY  ELLIPTA) 100-62.5-25 MCG/INH AEPB Inhale one puff once a day.  Marland Kitchen HYDROcodone-acetaminophen (NORCO/VICODIN) 5-325 MG tablet Take 1 tablet by mouth every 6 (six) hours as needed for moderate pain.  Marland Kitchen ibuprofen (ADVIL,MOTRIN) 800 MG tablet Take 1 tablet (800 mg total) by mouth every 8 (eight) hours as needed.  Marland Kitchen omeprazole (PRILOSEC) 20 MG capsule Take 20 mg by mouth daily.  . OXYGEN Inhale into the lungs. 2 litre at night  . predniSONE (STERAPRED UNI-PAK 21 TAB) 10 MG (21) TBPK tablet 6 day taper - take by mouth as directed for 6 days  . rOPINIRole (REQUIP) 0.5 MG tablet Take 1 tablet (0.5 mg total) by mouth at bedtime.   No facility-administered encounter medications on file as of 01/30/2021.    Surgical History: Past Surgical History:  Procedure Laterality Date  . APPENDECTOMY    . CESAREAN SECTION    . ECTOPIC PREGNANCY SURGERY      Medical History: Past Medical History:  Diagnosis Date  . Cancer ([place])   . COPD (chronic obstructive pulmonary disease) ([place])   . Diverticula of colon   . GERD (gastroesophageal reflux disease)   . Hyperlipidemia   . Renal insufficiency    2009 or 2010    Family History: Family History  Problem Relation Age of Onset  . Breast cancer Paternal Aunt   . Heart attack Mother     Social History: Social History   Socioeconomic History  . Marital status: Divorced    Spouse name: Not on file  . Number of children: Not on file  . Years of education: Not on  file  . Highest education level: Not on file  Occupational History  . Not on file  Tobacco Use  . Smoking status: Current Every Day Smoker    Packs/day: 0.50    Years: 50.00    Pack years: 25.00    Types: Cigarettes  . Smokeless tobacco: Never Used  Vaping Use  . Vaping Use: Never used  Substance and Sexual Activity  . Alcohol use: Yes    Comment: social  . Drug use: No  . Sexual activity: Not Currently  Other Topics Concern  . Not on file  Social History Narrative  . Not on file    Social Determinants of Health   Financial Resource Strain: Not on file  Food Insecurity: Not on file  Transportation Needs: Not on file  Physical Activity: Not on file  Stress: Not on file  Social Connections: Not on file  Intimate Partner Violence: Not on file    Vital Signs: Blood pressure 120/80, pulse 96, temperature 97.7 F (36.5 C), resp. rate 16, height _0  (1.575 m), weight 156 lb (70.8 kg), SpO2 96 %.  Examination: General Appearance: The patient is well-developed, well-nourished, and in no distress. Skin: Gross inspection of skin unremarkable. Head: normocephalic, no gross deformities. Eyes: no gross deformities noted. ENT: ears appear grossly normal no exudates. Neck: Supple. No thyromegaly. No LAD. Respiratory: no rhonchi noted at this time. Cardiovascular: Normal S1 and S2 without murmur or rub. Extremities: No cyanosis. pulses are equal. Neurologic: Alert and oriented. No involuntary movements.  LABS: Recent Results (from the past 2160 hour(s))  CBC w/Diff/Platelet     Status: Abnormal   Collection Time: 01/19/21 10:18 AM  Result Value Ref Range   WBC 9.2 3.4 - 10.8 x10E3/uL   RBC 4.56 3.77 - 5.28 x10E6/uL   Hemoglobin 13.7 11.1 - 15.9 g/dL   Hematocrit 41.0 34.0 - 46.6 %   MCV 90 79 - 97 fL   MCH 30.0 26.6 - 33.0 pg   MCHC 33.4 31.5 - 35.7 g/dL   RDW 13.1 11.7 - 15.4 %   Platelets 307 150 - 450 x10E3/uL   Neutrophils 38 Not Estab. %   Lymphs 48 Not Estab. %   Monocytes 8 Not Estab. %   Eos 5 Not Estab. %   Basos 1 Not Estab. %   Neutrophils Absolute 3.5 1.4 - 7.0 x10E3/uL   Lymphocytes Absolute 4.5 (H) 0.7 - 3.1 x10E3/uL   Monocytes Absolute 0.7 0.1 - 0.9 x10E3/uL   EOS (ABSOLUTE) 0.4 0.0 - 0.4 x10E3/uL   Basophils Absolute 0.1 0.0 - 0.2 x10E3/uL   Immature Granulocytes 0 Not Estab. %   Immature Grans (Abs) 0.0 0.0 - 0.1 x10E3/uL  Comprehensive Metabolic Panel (CMET)     Status: Abnormal   Collection Time: 01/19/21 10:18 AM  Result Value Ref  Range   Glucose 93 65 - 99 mg/dL   BUN 10 8 - 27 mg/dL   Creatinine, Ser 0.74 0.57 - 1.00 mg/dL   eGFR 88 >59 mL/min/1.73   BUN/Creatinine Ratio 14 12 - 28   Sodium 142 134 - 144 mmol/L   Potassium 4.5 3.5 - 5.2 mmol/L   Chloride 106 96 - 106 mmol/L   CO2 19 (L) 20 - 29 mmol/L   Calcium 10.1 8.7 - 10.3 mg/dL   Total Protein 7.7 6.0 - 8.5 g/dL   Albumin 4.6 3.8 - 4.8 g/dL   Globulin, Total 3.1 1.5 - 4.5 g/dL   Albumin/Globulin Ratio 1.5 1.2 - 2.2  Bilirubin Total 0.3 0.0 - 1.2 mg/dL   Alkaline Phosphatase 73 44 - 121 IU/L   AST 28 0 - 40 IU/L   ALT 36 (H) 0 - 32 IU/L  Lipid Panel With LDL/HDL Ratio     Status: Abnormal   Collection Time: 01/19/21 10:18 AM  Result Value Ref Range   Cholesterol, Total 278 (H) 100 - 199 mg/dL   Triglycerides 294 (H) 0 - 149 mg/dL   HDL 41 >39 mg/dL   VLDL Cholesterol Cal 57 (H) 5 - 40 mg/dL   LDL Chol Calc (NIH) 180 (H) 0 - 99 mg/dL   LDL/HDL Ratio 4.4 (H) 0.0 - 3.2 ratio    Comment:                                     LDL/HDL Ratio                                             Men  Women                               1/2 Avg.Risk  1.0    1.5                                   Avg.Risk  3.6    3.2                                2X Avg.Risk  6.2    5.0                                3X Avg.Risk  8.0    6.1   TSH + free T4     Status: None   Collection Time: 01/19/21 10:18 AM  Result Value Ref Range   TSH 1.510 0.450 - 4.500 uIU/mL   Free T4 0.87 0.82 - 1.77 ng/dL  Vitamin B12     Status: Abnormal   Collection Time: 01/19/21 10:18 AM  Result Value Ref Range   Vitamin B-12 218 (L) 232 - 1,245 pg/mL  Folate     Status: None   Collection Time: 01/19/21 10:18 AM  Result Value Ref Range   Folate 12.2 >3.0 ng/mL    Comment: A serum folate concentration of less than 3.1 ng/mL is considered to represent clinical deficiency.   Iron and TIBC     Status: None   Collection Time: 01/19/21 10:18 AM  Result Value Ref Range   Total Iron Binding Capacity  367 250 - 450 ug/dL   UIBC 260 118 - 369 ug/dL   Iron 107 27 - 139 ug/dL   Iron Saturation 29 15 - 55 %  Ferritin     Status: None   Collection Time: 01/19/21 10:18 AM  Result Value Ref Range   Ferritin 59 15 - 150 ng/mL    Radiology: CT Chest High Resolution  Result Date: 01/11/2021 CLINICAL DATA:  70 year old female with history of COPD. Shortness of breath. EXAM: CT CHEST WITHOUT CONTRAST TECHNIQUE: Multidetector CT imaging of  the chest was performed following the standard protocol without intravenous contrast. High resolution imaging of the lungs, as well as inspiratory and expiratory imaging, was performed. COMPARISON:  Chest CT 07/17/2018. FINDINGS: Cardiovascular: Heart size is normal. There is no significant pericardial fluid, thickening or pericardial calcification. There is aortic atherosclerosis, as well as atherosclerosis of the great vessels of the mediastinum and the coronary arteries, including calcified atherosclerotic plaque in the left main, left anterior descending and right coronary arteries. Mild calcifications of the mitral annulus. Mediastinum/Nodes: No pathologically enlarged mediastinal or hilar lymph nodes. Please note that accurate exclusion of hilar adenopathy is limited on noncontrast CT scans. Esophagus is unremarkable in appearance. No axillary lymphadenopathy. Lungs/Pleura: High-resolution images demonstrate a few patchy areas of mild ground-glass attenuation, septal thickening and cylindrical bronchiectasis, most evident in the inferior segment of the lingula, anterior aspect of the left lower lobe and medial aspect of the right lower lobe (adjacent to several prominent marginal osteophytes). No other generalized regions of fibrosis are noted. Specifically, no honeycombing. Inspiratory and expiratory imaging is unremarkable. No acute consolidative airspace disease. No pleural effusions. No definite suspicious appearing pulmonary nodules or masses are noted. Mild  paraseptal emphysema noted predominantly in the lung apices. Upper Abdomen: Aortic atherosclerosis. Musculoskeletal: There are no aggressive appearing lytic or blastic lesions noted in the visualized portions of the skeleton. IMPRESSION: 1. Very subtle fibrotic changes in the lungs, with a highly nonspecific pattern. The mild fibrosis in the medial aspect of the right lower lobe is adjacent to prominent marginal osteophytes, favored to be mechanical in nature (reference: Focal pulmonary interstitial opacities adjacent to thoracic spine osteophytes. AJR Am J Roentgenol. 2002 Oct;179(4):893-6). The findings in the left lung base could be post infectious or inflammatory in etiology. At this time, findings are considered highly nonspecific. If there is persistent clinical concern for interstitial lung disease, repeat high-resolution chest CT would be recommended in 12 months to assess for temporal changes in the appearance of the lung parenchyma. 2. Mild paraseptal emphysema. 3. Aortic atherosclerosis, in addition to left main and 2 vessel coronary artery disease. Please note that although the presence of coronary artery calcium documents the presence of coronary artery disease, the severity of this disease and any potential stenosis cannot be assessed on this non-gated CT examination. Assessment for potential risk factor modification, dietary therapy or pharmacologic therapy may be warranted, if clinically indicated. Aortic Atherosclerosis (ICD10-I70.0). Electronically Signed   By: Vinnie Langton M.D.   On: 01/11/2021 08:16    No results found.  CT Chest High Resolution  Result Date: 01/11/2021 CLINICAL DATA:  70 year old female with history of COPD. Shortness of breath. EXAM: CT CHEST WITHOUT CONTRAST TECHNIQUE: Multidetector CT imaging of the chest was performed following the standard protocol without intravenous contrast. High resolution imaging of the lungs, as well as inspiratory and expiratory imaging,  was performed. COMPARISON:  Chest CT 07/17/2018. FINDINGS: Cardiovascular: Heart size is normal. There is no significant pericardial fluid, thickening or pericardial calcification. There is aortic atherosclerosis, as well as atherosclerosis of the great vessels of the mediastinum and the coronary arteries, including calcified atherosclerotic plaque in the left main, left anterior descending and right coronary arteries. Mild calcifications of the mitral annulus. Mediastinum/Nodes: No pathologically enlarged mediastinal or hilar lymph nodes. Please note that accurate exclusion of hilar adenopathy is limited on noncontrast CT scans. Esophagus is unremarkable in appearance. No axillary lymphadenopathy. Lungs/Pleura: High-resolution images demonstrate a few patchy areas of mild ground-glass attenuation, septal thickening and cylindrical bronchiectasis,  most evident in the inferior segment of the lingula, anterior aspect of the left lower lobe and medial aspect of the right lower lobe (adjacent to several prominent marginal osteophytes). No other generalized regions of fibrosis are noted. Specifically, no honeycombing. Inspiratory and expiratory imaging is unremarkable. No acute consolidative airspace disease. No pleural effusions. No definite suspicious appearing pulmonary nodules or masses are noted. Mild paraseptal emphysema noted predominantly in the lung apices. Upper Abdomen: Aortic atherosclerosis. Musculoskeletal: There are no aggressive appearing lytic or blastic lesions noted in the visualized portions of the skeleton. IMPRESSION: 1. Very subtle fibrotic changes in the lungs, with a highly nonspecific pattern. The mild fibrosis in the medial aspect of the right lower lobe is adjacent to prominent marginal osteophytes, favored to be mechanical in nature (reference: Focal pulmonary interstitial opacities adjacent to thoracic spine osteophytes. AJR Am J Roentgenol. 2002 Oct;179(4):893-6). The findings in the left  lung base could be post infectious or inflammatory in etiology. At this time, findings are considered highly nonspecific. If there is persistent clinical concern for interstitial lung disease, repeat high-resolution chest CT would be recommended in 12 months to assess for temporal changes in the appearance of the lung parenchyma. 2. Mild paraseptal emphysema. 3. Aortic atherosclerosis, in addition to left main and 2 vessel coronary artery disease. Please note that although the presence of coronary artery calcium documents the presence of coronary artery disease, the severity of this disease and any potential stenosis cannot be assessed on this non-gated CT examination. Assessment for potential risk factor modification, dietary therapy or pharmacologic therapy may be warranted, if clinically indicated. Aortic Atherosclerosis (ICD10-I70.0). Electronically Signed   By: Vinnie Langton M.D.   On: 01/11/2021 08:16      Assessment and Plan: Patient Active Problem List   Diagnosis Date Noted  . 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 Left upper arm pain 02/13/2020  . Acute upper respiratory infection 12/20/2018  . Sore throat 12/20/2018  . Fever and chills 12/20/2018  . Hemoptysis 12/20/2018  . Urinary tract infection without hematuria 11/25/2018  . Dysuria 11/25/2018  . Respiratory distress 07/16/2018  . Sternoclavicular joint pain 04/01/2018  . Bronchiectasis with acute lower respiratory infection ([place]) 03/03/2018  . Nicotine abuse 03/03/2018  . Mild intermittent asthma without complication 51/12/5850  . Pulmonary nodule 03/03/2018  . Gastroesophageal reflux disease without esophagitis 03/03/2018  . Hyperlipidemia 05/15/2017  . Vitamin D deficiency 05/15/2017     1. Shortness of breath Likely underlying complication with history of cigarette smoking which she continues to do.  I did provide counseling for her to stop smoking as I think that this may significantly improve her  shortness of breath symptoms. - Spirometry with Graph  2. Chronic obstructive pulmonary disease, unspecified COPD type ([place]) She does have underlying COPD as a result of the cigarette smoking.  Once again encouraged to be compliant with recommended therapy.  3. Nicotine dependence with current use Stop smoking once again  4. Coronary artery disease due to calcified coronary lesion The patient will be referred to cardiology there was some calcified lesions noted and in order to make certain that coronary artery disease is not playing a role in her shortness of breath I recommended referral to cardiology follow-up. - Ambulatory referral to Cardiology  5. ILD (interstitial lung disease) ([place]) She will follow-up with pulmonology at the [facility] - Ambulatory referral to Pulmonology     General Counseling: I have discussed the findings of the evaluation and examination with Ilynn.  I have also discussed any further diagnostic evaluation thatmay be needed or ordered today. Chrisha verbalizes understanding of the findings of todays visit. We also reviewed her medications today and discussed drug interactions and side effects including but not limited excessive drowsiness and altered mental states. We also discussed that there is always a risk not just to her but also people around her. she has been encouraged to call the office with any questions or concerns that should arise related to todays visit.  Orders Placed This Encounter  Procedures  . Spirometry with Graph    Order Specific Question:   Where should this test be performed?    Answer:   Other     Time spent: 77  I have personally obtained a history, examined the patient, evaluated laboratory and imaging results, formulated the assessment and plan and placed orders.    Allyne Gee, MD [facility] Pulmonary and Critical Care Sleep medicine

## 2021-02-05 ENCOUNTER — Other Ambulatory Visit: Payer: Self-pay | Admitting: Hospice and Palliative Medicine

## 2021-02-05 DIAGNOSIS — J449 Chronic obstructive pulmonary disease, unspecified: Secondary | ICD-10-CM

## 2021-02-06 ENCOUNTER — Ambulatory Visit (INDEPENDENT_AMBULATORY_CARE_PROVIDER_SITE_OTHER): Payer: BC Managed Care – PPO

## 2021-02-06 DIAGNOSIS — E538 Deficiency of other specified B group vitamins: Secondary | ICD-10-CM

## 2021-02-10 ENCOUNTER — Other Ambulatory Visit: Payer: Self-pay | Admitting: Hospice and Palliative Medicine

## 2021-02-10 DIAGNOSIS — G2581 Restless legs syndrome: Secondary | ICD-10-CM

## 2021-03-02 ENCOUNTER — Ambulatory Visit: Payer: BC Managed Care – PPO | Admitting: Hospice and Palliative Medicine

## 2021-03-02 MED ORDER — CYANOCOBALAMIN 1000 MCG/ML IJ SOLN
1000.0000 ug | Freq: Once | INTRAMUSCULAR | Status: DC
  Administered 2021-02-06: 1000 ug via INTRAMUSCULAR

## 2021-03-02 MED ORDER — CYANOCOBALAMIN 1000 MCG/ML IJ SOLN
1000.0000 ug | Freq: Once | INTRAMUSCULAR | Status: AC
  Administered 2021-02-06: 1000 ug via INTRAMUSCULAR

## 2021-03-02 MED ORDER — CYANOCOBALAMIN 1000 MCG/ML IJ SOLN: 1000.0000 ug | Freq: Once | INTRAMUSCULAR | Status: DC

## 2021-03-08 ENCOUNTER — Other Ambulatory Visit: Payer: Self-pay

## 2021-03-08 ENCOUNTER — Ambulatory Visit (INDEPENDENT_AMBULATORY_CARE_PROVIDER_SITE_OTHER): Payer: BC Managed Care – PPO

## 2021-03-08 DIAGNOSIS — E538 Deficiency of other specified B group vitamins: Secondary | ICD-10-CM

## 2021-03-09 ENCOUNTER — Other Ambulatory Visit: Payer: Self-pay | Admitting: Hospice and Palliative Medicine

## 2021-03-09 DIAGNOSIS — E538 Deficiency of other specified B group vitamins: Secondary | ICD-10-CM | POA: Diagnosis not present

## 2021-03-09 DIAGNOSIS — G2581 Restless legs syndrome: Secondary | ICD-10-CM

## 2021-03-09 MED ORDER — CYANOCOBALAMIN 1000 MCG/ML IJ SOLN
1000.0000 ug | Freq: Once | INTRAMUSCULAR | Status: AC
  Administered 2021-03-09: 1000 ug via INTRAMUSCULAR

## 2021-03-13 ENCOUNTER — Ambulatory Visit: Payer: BC Managed Care – PPO | Admitting: Internal Medicine

## 2021-03-24 ENCOUNTER — Telehealth: Payer: Self-pay

## 2021-03-24 NOTE — Telephone Encounter (Signed)
Patient was advised of getting pcp and pulmonary care at the same place as she was referred out to [facility]. Stephanie Hess

## 2021-04-05 ENCOUNTER — Other Ambulatory Visit: Payer: Self-pay

## 2021-04-05 ENCOUNTER — Encounter: Payer: Self-pay | Admitting: Emergency Medicine

## 2021-04-05 ENCOUNTER — Ambulatory Visit (INDEPENDENT_AMBULATORY_CARE_PROVIDER_SITE_OTHER): Payer: BC Managed Care – PPO

## 2021-04-05 ENCOUNTER — Ambulatory Visit
Admission: EM | Admit: 2021-04-05 | Discharge: 2021-04-05 | Disposition: A | Discharge status: Home / Self Care | Payer: BC Managed Care – PPO | Attending: Family Medicine | Admitting: Family Medicine

## 2021-04-05 DIAGNOSIS — R6883 Chills (without fever): Secondary | ICD-10-CM | POA: Insufficient documentation

## 2021-04-05 DIAGNOSIS — Z880 Allergy status to penicillin: Secondary | ICD-10-CM | POA: Diagnosis not present

## 2021-04-05 DIAGNOSIS — Z20822 Contact with and (suspected) exposure to covid-19: Secondary | ICD-10-CM | POA: Diagnosis not present

## 2021-04-05 DIAGNOSIS — R059 Cough, unspecified: Secondary | ICD-10-CM

## 2021-04-05 DIAGNOSIS — F17210 Nicotine dependence, cigarettes, uncomplicated: Secondary | ICD-10-CM | POA: Diagnosis not present

## 2021-04-05 DIAGNOSIS — J069 Acute upper respiratory infection, unspecified: Secondary | ICD-10-CM | POA: Diagnosis not present

## 2021-04-05 DIAGNOSIS — Z881 Allergy status to other antibiotic agents status: Secondary | ICD-10-CM | POA: Insufficient documentation

## 2021-04-05 DIAGNOSIS — Z882 Allergy status to sulfonamides status: Secondary | ICD-10-CM | POA: Diagnosis not present

## 2021-04-05 LAB — SARS CORONAVIRUS 2 (TAT 6-24 HRS): SARS Coronavirus 2: NEGATIVE

## 2021-04-05 MED ORDER — BENZONATATE 100 MG PO CAPS: 200.0000 mg | ORAL_CAPSULE | Freq: Three times a day (TID) | ORAL | 0 refills | Status: DC

## 2021-04-05 MED ORDER — PROMETHAZINE-DM 6.25-15 MG/5ML PO SYRP: 5.0000 mL | ORAL_SOLUTION | Freq: Four times a day (QID) | ORAL | 0 refills | Status: DC | PRN

## 2021-04-05 NOTE — ED Triage Notes (Signed)
Pt c/o cough, nasal congestion, headache, chills. Started about 3 days ago. She state she had a covid exposure. Denies fever.

## 2021-04-05 NOTE — Discharge Instructions (Addendum)
Isolate at home pending the results of your COVID test.  If you test positive then you will have to quarantine for 5 days from the start of your symptoms.  After 5 days you can break quarantine if your symptoms have improved and you have not had a fever for 24 hours without taking Tylenol or ibuprofen.  Use over-the-counter Tylenol and ibuprofen as needed for body aches and fever.  Use the Tessalon Perles during the day as needed for cough and the Promethazine DM cough syrup at nighttime as will make you drowsy.  Use your albuterol inhaler, 2 puffs every 4-6 hours, as needed for shortness of breath and wheezing.  If you develop any increased shortness of breath-especially at rest, you are unable to speak in full sentences, or is a late sign your lips are turning blue you need to go the ER for evaluation.

## 2021-04-05 NOTE — ED Provider Notes (Signed)
[facility]    CSN: [id number] Arrival date & time: 04/05/21  1157      History   Chief Complaint Chief Complaint  Patient presents with  . Cough    HPI Stephanie Hess is a 70 y.o. female.   HPI   70 year old female here for evaluation of respiratory complaints.  Patient reports that she has had a COVID exposure and for the last 3 days she has been experiencing headache, chills, nasal congestion with a runny nose for clear nasal discharge, ear pain, productive cough for a white sputum, shortness of breath and wheezing.  She denies fever, sore throat, nausea vomiting, or body aches.  Patient has had her COVID-vaccine but not her booster shot.  Past Medical History:  Diagnosis Date  . Cancer ([place])   . COPD (chronic obstructive pulmonary disease) ([place])   . Diverticula of colon   . GERD (gastroesophageal reflux disease)   . Hyperlipidemia   . Renal insufficiency    2009 or 2010    Patient Active Problem List   Diagnosis Date Noted  . 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 Left upper arm pain 02/13/2020  . Acute upper respiratory infection 12/20/2018  . Sore throat 12/20/2018  . Fever and chills 12/20/2018  . Hemoptysis 12/20/2018  . Urinary tract infection without hematuria 11/25/2018  . Dysuria 11/25/2018  . Respiratory distress 07/16/2018  . Sternoclavicular joint pain 04/01/2018  . Bronchiectasis with acute lower respiratory infection ([place]) 03/03/2018  . Nicotine abuse 03/03/2018  . Mild intermittent asthma without complication 17/00/1749  . Pulmonary nodule 03/03/2018  . Gastroesophageal reflux disease without esophagitis 03/03/2018  . Hyperlipidemia 05/15/2017  . Vitamin D deficiency 05/15/2017    Past Surgical History:  Procedure Laterality Date  . APPENDECTOMY    . CESAREAN SECTION    . ECTOPIC PREGNANCY SURGERY      OB History    Gravida  2   Para  2   Term      Preterm      AB      Living        SAB       IAB      Ectopic      Multiple      Live Births               Home Medications    Prior to Admission medications   Medication Sig Start Date End Date Taking? Authorizing Provider  albuterol (VENTOLIN HFA) 108 (90 Base) MCG/ACT inhaler TAKE 2 PUFFS BY MOUTH EVERY 6 HOURS AS NEEDED FOR WHEEZE OR SHORTNESS OF BREATH 02/05/21  Yes Luiz Ochoa, NP  aspirin EC 81 MG tablet Take 1 tablet (81 mg total) by mouth daily. 06/04/16  Yes Wende Bushy, MD  benzonatate (TESSALON) 100 MG capsule Take 2 capsules (200 mg total) by mouth every 8 (eight) hours. 04/05/21  Yes Margarette Canada, NP  calcium-vitamin D (OSCAL WITH D) 500-200 MG-UNIT tablet Take 1 tablet by mouth.   Yes [provider]  ergocalciferol (VITAMIN D2) 1.25 MG (50000 UT) capsule ergocalciferol (vitamin D2) 1,250 mcg (50,000 unit) capsule   Yes [provider]  omeprazole (PRILOSEC) 20 MG capsule Take 20 mg by mouth daily.   Yes [provider]  OXYGEN Inhale into the lungs. 2 litre at night   Yes [provider]  promethazine-dextromethorphan (PROMETHAZINE-DM) 6.25-15 MG/5ML syrup Take 5 mLs by mouth 4 (four) times daily as needed. 04/05/21  Yes Margarette Canada,  NP  rOPINIRole (REQUIP) 0.5 MG tablet TAKE 1 TABLET BY MOUTH EVERYDAY AT BEDTIME 03/09/21  Yes Lavera Guise, MD  traZODone (DESYREL) 50 MG tablet  03/18/21  Yes [provider]  ibuprofen (ADVIL,MOTRIN) 800 MG tablet Take 1 tablet (800 mg total) by mouth every 8 (eight) hours as needed. 10/15/18   Kendell Bane, NP    Family History Family History  Problem Relation Age of Onset  . Breast cancer Paternal Aunt   . Heart attack Mother     Social History Social History   Tobacco Use  . Smoking status: Current Every Day Smoker    Packs/day: 0.50    Years: 50.00    Pack years: 25.00    Types: Cigarettes  . Smokeless tobacco: Never Used  Vaping Use  . Vaping Use: Never used  Substance Use Topics  . Alcohol use: Yes     Comment: social  . Drug use: No     Allergies   Penicillins, Shellfish allergy, Sulfa antibiotics, Biaxin [clarithromycin], Amoxicillin-pot clavulanate, Iodinated diagnostic agents, Atorvastatin, and Macrobid [nitrofurantoin]   Review of Systems Review of Systems  Constitutional: Positive for chills. Negative for activity change, appetite change and fever.  HENT: Positive for congestion, ear pain and rhinorrhea. Negative for sore throat.   Respiratory: Positive for cough, shortness of breath and wheezing.   Gastrointestinal: Positive for nausea and vomiting.  Musculoskeletal: Negative for arthralgias and myalgias.  Skin: Negative for rash.  Neurological: Positive for headaches.  Hematological: Negative.   Psychiatric/Behavioral: Negative.      Physical Exam Triage Vital Signs ED Triage Vitals  Enc Vitals Group     BP 04/05/21 1213 127/70     Pulse Rate 04/05/21 1213 89     Resp 04/05/21 1213 18     Temp 04/05/21 1213 98.7 F (37.1 C)     Temp Source 04/05/21 1213 Oral     SpO2 04/05/21 1213 97 %     Weight 04/05/21 1214 156 lb 1.4 oz (70.8 kg)     Height 04/05/21 1214 5\' 2"  (1.575 m)     Head Circumference --      Peak Flow --      Pain Score 04/05/21 1214 4     Pain Loc --      Pain Edu? --      Excl. in [place]? --    No data found.  Updated Vital Signs BP 127/70 (BP Location: Right Arm)   Pulse 89   Temp 98.7 F (37.1 C) (Oral)   Resp 18   Ht 5\' 2"  (1.575 m)   Wt 156 lb 1.4 oz (70.8 kg)   SpO2 97%   BMI 28.55 kg/m   Visual Acuity Right Eye Distance:   Left Eye Distance:   Bilateral Distance:    Right Eye Near:   Left Eye Near:    Bilateral Near:     Physical Exam Vitals and nursing note reviewed.  Constitutional:      General: She is not in acute distress.    Appearance: Normal appearance. She is normal weight. She is not ill-appearing.  HENT:     Head: Normocephalic and atraumatic.     Right Ear: Tympanic membrane, ear canal and external ear  normal. There is no impacted cerumen.     Left Ear: Tympanic membrane, ear canal and external ear normal. There is no impacted cerumen.     Nose: Congestion and rhinorrhea present.     Mouth/Throat:  Mouth: Mucous membranes are moist.     Pharynx: Oropharynx is clear. Posterior oropharyngeal erythema present.  Cardiovascular:     Rate and Rhythm: Normal rate and regular rhythm.     Pulses: Normal pulses.     Heart sounds: Normal heart sounds. No murmur heard. No gallop.   Pulmonary:     Effort: Pulmonary effort is normal.     Breath sounds: Rhonchi present. No wheezing or rales.  Musculoskeletal:     Cervical back: Normal range of motion and neck supple.  Lymphadenopathy:     Cervical: No cervical adenopathy.  Skin:    General: Skin is warm.     Capillary Refill: Capillary refill takes less than 2 seconds.     Findings: No erythema.  Neurological:     General: No focal deficit present.     Mental Status: She is alert and oriented to person, place, and time.  Psychiatric:        Mood and Affect: Mood normal.        Behavior: Behavior normal.        Thought Content: Thought content normal.        Judgment: Judgment normal.      UC Treatments / Results  Labs (all labs ordered are listed, but only abnormal results are displayed) Labs Reviewed  SARS CORONAVIRUS 2 (TAT 6-24 HRS)    EKG   Radiology DG Chest 2 View  Result Date: 04/05/2021 CLINICAL DATA:  Productive cough a and chills for 3 days. COVID 19 exposure. EXAM: CHEST - 2 VIEW COMPARISON:  08/22/2020 FINDINGS: The heart size and mediastinal contours are within normal limits. Both lungs are clear. The visualized skeletal structures are unremarkable. IMPRESSION: No active cardiopulmonary disease. Electronically Signed   By: Marlaine Hind M.D.   On: 04/05/2021 13:01    Procedures Procedures (including critical care time)  Medications Ordered in UC Medications - No data to display  Initial Impression /  Assessment and Plan / UC Course  I have reviewed the triage vital signs and the nursing notes.  Pertinent labs & imaging results that were available during my care of the patient were reviewed by me and considered in my medical decision making (see chart for details).   Patient is a very pleasant 70 year old female here for evaluation of respiratory complaints after being exposed to [place].  For the last 3 days she has had a headache, chills, nasal congestion with clear nasal discharge, ear pain, shortness with wheezing, and a productive cough for white sputum.  Patient denies fever, sore throat, nausea vomiting, or body aches.  She reports that she has had some diarrhea but she attributed that to some fresh strawberries that she ate.  Physical exam reveals pearly gray tympanic membranes bilaterally with normal light reflex and clear external auditory canals.  Nasal mucosa is erythematous and edematous with clear nasal discharge.  Oropharyngeal exam reveals posterior oropharyngeal erythema with clear postnasal drip.  No cervical lymphadenopathy appreciated on exam.  Cardiopulmonary exam reveals coarse rhonchi in all lung fields.  Patient is a smoker with a history of pulmonary nodule and bronchiectasis.  Patient also has a history of interstitial lung disease.  Will obtain chest x-ray.  COVID swab collected and sent at triage.  Radiology interpretation of chest x-ray is negative for acute intrathoracic process.  We will treat patient for respiratory illness while her COVID is pending.  We will give patient Tessalon Perles and Promethazine DM to help with her cough and congestion  and have her use her previously ordered albuterol inhaler as needed for shortness of breath and wheezing.  We will have patient isolate pending the results of her [place] she is still within the window for antiviral therapy a GFR from January 19, 2021 of 88 so patient can take Paxil of it providing it does not interfere  with the rest of the medications.   Final Clinical Impressions(s) / UC Diagnoses   Final diagnoses:  Viral URI with cough     Discharge Instructions     Isolate at home pending the results of your COVID test.  If you test positive then you will have to quarantine for 5 days from the start of your symptoms.  After 5 days you can break quarantine if your symptoms have improved and you have not had a fever for 24 hours without taking Tylenol or ibuprofen.  Use over-the-counter Tylenol and ibuprofen as needed for body aches and fever.  Use the Tessalon Perles during the day as needed for cough and the Promethazine DM cough syrup at nighttime as will make you drowsy.  Use your albuterol inhaler, 2 puffs every 4-6 hours, as needed for shortness of breath and wheezing.  If you develop any increased shortness of breath-especially at rest, you are unable to speak in full sentences, or is a late sign your lips are turning blue you need to go the ER for evaluation.     ED Prescriptions    Medication Sig Dispense Auth. Provider   benzonatate (TESSALON) 100 MG capsule Take 2 capsules (200 mg total) by mouth every 8 (eight) hours. 21 capsule Margarette Canada, NP   promethazine-dextromethorphan (PROMETHAZINE-DM) 6.25-15 MG/5ML syrup Take 5 mLs by mouth 4 (four) times daily as needed. 118 mL Margarette Canada, NP     PDMP not reviewed this encounter.   Margarette Canada, NP 04/05/21 1325

## 2021-04-17 ENCOUNTER — Encounter: Payer: Self-pay | Admitting: Nurse Practitioner

## 2021-04-17 ENCOUNTER — Other Ambulatory Visit: Payer: Self-pay

## 2021-04-17 ENCOUNTER — Ambulatory Visit (INDEPENDENT_AMBULATORY_CARE_PROVIDER_SITE_OTHER): Payer: BC Managed Care – PPO | Admitting: Nurse Practitioner

## 2021-04-17 VITALS — BP 109/68 | HR 94 | Temp 98.1°F | Ht 62.0 in | Wt 153.0 lb

## 2021-04-17 DIAGNOSIS — Z1231 Encounter for screening mammogram for malignant neoplasm of breast: Secondary | ICD-10-CM

## 2021-04-17 DIAGNOSIS — R059 Cough, unspecified: Secondary | ICD-10-CM

## 2021-04-17 DIAGNOSIS — Z7689 Persons encountering health services in other specified circumstances: Secondary | ICD-10-CM | POA: Diagnosis not present

## 2021-04-17 DIAGNOSIS — Z13820 Encounter for screening for osteoporosis: Secondary | ICD-10-CM

## 2021-04-17 DIAGNOSIS — J441 Chronic obstructive pulmonary disease with (acute) exacerbation: Secondary | ICD-10-CM | POA: Diagnosis not present

## 2021-04-17 MED ORDER — BENZONATATE 100 MG PO CAPS: 200.0000 mg | ORAL_CAPSULE | Freq: Three times a day (TID) | ORAL | 1 refills | Status: DC

## 2021-04-17 MED ORDER — METHYLPREDNISOLONE 4 MG PO TBPK: ORAL_TABLET | ORAL | 0 refills | Status: DC

## 2021-04-17 MED ORDER — AZITHROMYCIN 250 MG PO TABS: ORAL_TABLET | ORAL | 0 refills | Status: DC

## 2021-04-17 NOTE — Patient Instructions (Signed)
Chronic Obstructive Pulmonary Disease Exacerbation  Chronic obstructive pulmonary disease (COPD) is a long-term (chronic) condition that affects the lungs. COPD is a general term that can be used to describe many different lung problems that cause lung swelling (inflammation) and limit airflow, including chronic bronchitis and emphysema. COPD exacerbations are episodes when breathing symptoms become much worse and require extra treatment. COPD exacerbations are usually caused by infections. Without treatment, COPD exacerbations can be severe and even life threatening. Frequent COPD exacerbations can cause further damage to the lungs. What are the causes? This condition may be caused by:  Respiratory infections, including viral and bacterial infections.  Exposure to smoke.  Exposure to air pollution, chemical fumes, or dust.  Things that give you an allergic reaction (allergens).  Not taking your usual COPD medicines as directed.  Underlying medical problems, such as congestive heart failure or infections not involving the lungs. In many cases, the cause (trigger) of this condition is not known. What increases the risk? The following factors may make you more likely to develop this condition:  Smoking cigarettes.  Old age.  Frequent prior COPD exacerbations. What are the signs or symptoms? Symptoms of this condition include:  Increased coughing.  Increased production of mucus from your lungs (sputum).  Increased wheezing.  Increased shortness of breath.  Rapid or labored breathing.  Chest tightness.  Less energy than usual.  Sleep disruption from symptoms.  Confusion or increased sleepiness. Often these symptoms happen or get worse even with the use of medicines. How is this diagnosed? This condition is diagnosed based on:  Your medical history.  A physical exam. You may also have tests, including:  A chest X-ray.  Blood tests.  Lung (pulmonary) function  tests. How is this treated? Treatment for this condition depends on the severity and cause of the symptoms. You may need to be admitted to a hospital for treatment. Some of the treatments commonly used to treat COPD exacerbations are:  Antibiotic medicines. These may be used for severe exacerbations caused by a lung infection, such as pneumonia.  Bronchodilators. These are inhaled medicines that expand the air passages and allow increased airflow.  Steroid medicines. These act to reduce inflammation in the airways. They may be given with an inhaler, taken by mouth, or given through an IV tube inserted into one of your veins.  Supplemental oxygen therapy.  Airway clearing techniques, such as noninvasive ventilation (NIV) and positive expiratory pressure (PEP). These provide respiratory support through a mask or other noninvasive device. An example of this would be using a continuous positive airway pressure (CPAP) machine to improve delivery of oxygen into your lungs. Follow these instructions at home: Medicines  Take over-the-counter and prescription medicines only as told by your health care provider. It is important to use correct technique with inhaled medicines.  If you were prescribed an antibiotic medicine or oral steroid, take it as told by your health care provider. Do not stop taking the medicine even if you start to feel better. Lifestyle  Eat a healthy diet.  Exercise regularly.  Get plenty of sleep.  Avoid exposure to all substances that irritate the airway, especially to tobacco smoke.  Wash your hands often with soap and water to reduce the risk of infection. If soap and water are not available, use hand sanitizer.  During flu season, avoid enclosed spaces that are crowded with people. General instructions  Drink enough fluid to keep your urine clear or pale yellow (unless you have a medical   condition that requires fluid restriction).  Use a cool mist vaporizer. This  humidifies the air and makes it easier for you to clear your chest when you cough.  If you have a home nebulizer and oxygen, continue to use them as told by your health care provider.  Keep all follow-up visits as told by your health care provider. This is important. How is this prevented?  Stay up-to-date on pneumococcal and influenza (flu) vaccines. A flu shot is recommended every year to help prevent exacerbations.  Do not use any products that contain nicotine or tobacco, such as cigarettes and e-cigarettes. Quitting smoking is very important in preventing COPD from getting worse and in preventing exacerbations from happening as often. If you need help quitting, ask your health care provider.  Follow all instructions for pulmonary rehabilitation after a recent exacerbation. This can help prevent future exacerbations.  Work with your health care provider to develop and follow an action plan. This tells you what steps to take when you experience certain symptoms. Contact a health care provider if:  You have a worsening of your regular COPD symptoms. Get help right away if:  You have worsening shortness of breath, even when resting.  You have trouble talking.  You have severe chest pain.  You cough up blood.  You have a fever.  You have weakness, vomit repeatedly, or faint.  You feel confused.  You are not able to sleep because of your symptoms.  You have trouble doing daily activities. Summary  COPD exacerbations are episodes when breathing symptoms become much worse and require extra treatment above your normal treatment.  Exacerbations can be severe and even life threatening. Frequent COPD exacerbations can cause further damage to your lungs.  COPD exacerbations are usually triggered by infections such as the flu, colds, and even pneumonia.  Treatment for this condition depends on the severity and cause of the symptoms. You may need to be admitted to a hospital for  treatment.  Quitting smoking is very important to prevent COPD from getting worse and to prevent exacerbations from happening as often. This information is not intended to replace advice given to you by your health care provider. Make sure you discuss any questions you have with your health care provider. Document Revised: 10/10/2017 Document Reviewed: 12/02/2016 Elsevier Patient Education  2021 [employer]  

## 2021-04-17 NOTE — Progress Notes (Signed)
New Patient Office Visit  Subjective:  Patient ID: Stephanie Hess, female    DOB: 01-17-1951  Age: 70 y.o. MRN: [id number]  CC:  Chief Complaint  Patient presents with   New Patient (Initial Visit)    HPI Stephanie Hess presents to establish new primary care provider. She is seeking new primary care office after her PCP left office where she was having care. She has concerns of fatigue. She had been receiving B12 injections per her prior PCP, but has not had one in some time. Her last b12 injection was given in 02/2021. She is now taking oral B12 supplement. She has not had repeat labs done since that final injection. She has also seen new pulmonologist at [facility]. She does have cough and persistent congestion since having COVID. Recent chest  x-ray did show scarring from recent pneumonia and some emphysema in upper lobs of both lungs. The patient is a long-term smoker. The patient did have a visit to ER 04/05/2021 due to respiratory symptoms she was experiencing after having a known exposure to COVID 19. She was given two prescriptions for cough suppressant. She states that she continues to cough. This is sometimes productive. She has not noted any improvement in symptoms. She does have wheezig present.  She states that she has some difficulty with sleep. She takes trazodone some times which will help her to fall asleep. She will generally wake up two hours later. She states that taking NyQuil along with the trazodone helps moe than anything.  She is due to have mammogram and bone density test.   Past Medical History:  Diagnosis Date   Cancer ([facility])    COPD (chronic obstructive pulmonary disease) ([place])    Diverticula of colon    GERD (gastroesophageal reflux disease)    Hyperlipidemia    Renal insufficiency    2009 or 2010    Past Surgical History:  Procedure Laterality Date   APPENDECTOMY     CESAREAN SECTION     ECTOPIC PREGNANCY SURGERY      Family History  Problem Relation Age  of Onset   Breast cancer Paternal Aunt    Heart attack Mother     Social History   Socioeconomic History   Marital status: Divorced    Spouse name: Not on file   Number of children: Not on file   Years of education: Not on file   Highest education level: Not on file  Occupational History   Not on file  Tobacco Use   Smoking status: Every Day    Packs/day: 0.50    Years: 50.00    Pack years: 25.00    Types: Cigarettes   Smokeless tobacco: Never  Vaping Use   Vaping Use: Never used  Substance and Sexual Activity   Alcohol use: Yes    Comment: social   Drug use: No   Sexual activity: Not Currently  Other Topics Concern   Not on file  Social History Narrative   Not on file   Social Determinants of Health   Financial Resource Strain: Not on file  Food Insecurity: Not on file  Transportation Needs: Not on file  Physical Activity: Not on file  Stress: Not on file  Social Connections: Not on file  Intimate Partner Violence: Not on file    ROS Review of Systems  Constitutional:  Positive for fatigue. Negative for chills and fever.  HENT:  Positive for congestion, postnasal drip, rhinorrhea, sinus pressure and sinus pain.  Eyes: Negative.   Respiratory:  Positive for cough, shortness of breath and wheezing.   Cardiovascular:  Negative for chest pain and palpitations.  Gastrointestinal:  Negative for constipation, diarrhea, nausea and vomiting.  Endocrine: Negative for cold intolerance, heat intolerance, polydipsia and polyuria.  Musculoskeletal:  Negative for back pain and myalgias.  Skin:  Negative for rash.  Allergic/Immunologic: Positive for environmental allergies.  Neurological:  Positive for headaches. Negative for dizziness and weakness.  Hematological:  Negative for adenopathy.  Psychiatric/Behavioral:  Positive for sleep disturbance. The patient is not nervous/anxious.    Objective:   Today's Vitals   04/17/21 1352  BP: 109/68  Pulse: 94  Temp: 98.1  F (36.7 C)  SpO2: 94%  Weight: 153 lb (69.4 kg)  Height: 5\' 2"  (1.575 m)   Body mass index is 27.98 kg/m.    Physical Exam Vitals and nursing note reviewed.  Constitutional:      Appearance: Normal appearance. She is well-developed. She is ill-appearing.  HENT:     Head: Normocephalic.     Right Ear: Tympanic membrane is bulging.     Left Ear: Tympanic membrane is bulging.     Nose: Congestion present.     Right Sinus: Maxillary sinus tenderness and frontal sinus tenderness present.     Left Sinus: Maxillary sinus tenderness and frontal sinus tenderness present.     Mouth/Throat:     Pharynx: Posterior oropharyngeal erythema present.     Comments: Post nasal drip evident.  Eyes:     Extraocular Movements: Extraocular movements intact.     Conjunctiva/sclera: Conjunctivae normal.     Pupils: Pupils are equal, round, and reactive to light.  Cardiovascular:     Rate and Rhythm: Normal rate and regular rhythm.     Pulses: Normal pulses.     Heart sounds: Normal heart sounds.  Pulmonary:     Effort: Pulmonary effort is normal.     Breath sounds: Wheezing and rhonchi present.     Comments: Congested cough is present Abdominal:     Palpations: Abdomen is soft.  Musculoskeletal:        General: Normal range of motion.     Cervical back: Normal range of motion and neck supple.  Lymphadenopathy:     Cervical: Cervical adenopathy present.  Skin:    General: Skin is warm and dry.     Capillary Refill: Capillary refill takes less than 2 seconds.  Neurological:     General: No focal deficit present.     Mental Status: She is alert and oriented to person, place, and time.  Psychiatric:        Mood and Affect: Mood normal.        Behavior: Behavior normal.        Thought Content: Thought content normal.        Judgment: Judgment normal.    Assessment & Plan:  1. Encounter to establish care Appointment today to establish with new primary care office   2. COPD with acute  exacerbation ([place]) Start z-pack. Take as directed for 5 days. Add medrol taper. Take as directed for 6 days. Rest and increase fluids. Continue inhalers and respiratory medications as prescribed. Follow up with pulmonology as scheduled.  - methylPREDNISolone (MEDROL) 4 MG TBPK tablet; Take by mouth as directed for 6 days  Dispense: 21 tablet; Refill: 0 - azithromycin (ZITHROMAX) 250 MG tablet; z-pack - take as directed for 5 days  Dispense: 6 tablet; Refill: 0  3. Cough May take  tessalon perls up to three times daily as needed for cough.  - benzonatate (TESSALON) 100 MG capsule; Take 2 capsules (200 mg total) by mouth every 8 (eight) hours.  Dispense: 30 capsule; Refill: 1  4. Encounter for screening mammogram for malignant neoplasm of breast Order placed for screening mammogram.  - MM DIGITAL SCREENING BILATERAL; Future  5. Screening for osteoporosis Order placed for bond density test. - DG Bone Density; Future   Problem List Items Addressed This Visit       Respiratory   COPD with acute exacerbation (HCC)   Relevant Medications   methylPREDNISolone (MEDROL) 4 MG TBPK tablet   azithromycin (ZITHROMAX) 250 MG tablet   benzonatate (TESSALON) 100 MG capsule     Other   Encounter to establish care - Primary   Cough   Relevant Medications   benzonatate (TESSALON) 100 MG capsule   Encounter for screening mammogram for malignant neoplasm of breast   Relevant Orders   MM DIGITAL SCREENING BILATERAL   Screening for osteoporosis   Relevant Orders   DG Bone Density    Outpatient Encounter Medications as of 04/17/2021  Medication Sig   albuterol (VENTOLIN HFA) 108 (90 Base) MCG/ACT inhaler TAKE 2 PUFFS BY MOUTH EVERY 6 HOURS AS NEEDED FOR WHEEZE OR SHORTNESS OF BREATH   aspirin EC 81 MG tablet Take 1 tablet (81 mg total) by mouth daily.   azithromycin (ZITHROMAX) 250 MG tablet z-pack - take as directed for 5 days   calcium-vitamin D (OSCAL WITH D) 500-200 MG-UNIT tablet Take 1 tablet  by mouth.   ibuprofen (ADVIL,MOTRIN) 800 MG tablet Take 1 tablet (800 mg total) by mouth every 8 (eight) hours as needed.   methylPREDNISolone (MEDROL) 4 MG TBPK tablet Take by mouth as directed for 6 days   omeprazole (PRILOSEC) 20 MG capsule Take 20 mg by mouth daily.   OXYGEN Inhale into the lungs. 2 litre at night   traZODone (DESYREL) 50 MG tablet    benzonatate (TESSALON) 100 MG capsule Take 2 capsules (200 mg total) by mouth every 8 (eight) hours.   [DISCONTINUED] benzonatate (TESSALON) 100 MG capsule Take 2 capsules (200 mg total) by mouth every 8 (eight) hours.   [DISCONTINUED] ergocalciferol (VITAMIN D2) 1.25 MG (50000 UT) capsule ergocalciferol (vitamin D2) 1,250 mcg (50,000 unit) capsule   [DISCONTINUED] promethazine-dextromethorphan (PROMETHAZINE-DM) 6.25-15 MG/5ML syrup Take 5 mLs by mouth 4 (four) times daily as needed.   [DISCONTINUED] rOPINIRole (REQUIP) 0.5 MG tablet TAKE 1 TABLET BY MOUTH EVERYDAY AT BEDTIME   No facility-administered encounter medications on file as of 04/17/2021.    Follow-up: Return in about 4 months (around 08/17/2021) for medicare wellness .   Ronnell Freshwater, NP

## 2021-04-19 ENCOUNTER — Ambulatory Visit: Payer: BC Managed Care – PPO | Admitting: Physician Assistant

## 2021-04-29 DIAGNOSIS — Z7689 Persons encountering health services in other specified circumstances: Secondary | ICD-10-CM | POA: Insufficient documentation

## 2021-04-29 DIAGNOSIS — R059 Cough, unspecified: Secondary | ICD-10-CM | POA: Insufficient documentation

## 2021-04-29 DIAGNOSIS — J441 Chronic obstructive pulmonary disease with (acute) exacerbation: Secondary | ICD-10-CM | POA: Insufficient documentation

## 2021-04-29 DIAGNOSIS — Z13820 Encounter for screening for osteoporosis: Secondary | ICD-10-CM | POA: Insufficient documentation

## 2021-04-29 DIAGNOSIS — Z1231 Encounter for screening mammogram for malignant neoplasm of breast: Secondary | ICD-10-CM | POA: Insufficient documentation

## 2021-06-17 ENCOUNTER — Other Ambulatory Visit: Payer: Self-pay | Admitting: Nurse Practitioner

## 2021-06-17 DIAGNOSIS — R059 Cough, unspecified: Secondary | ICD-10-CM

## 2021-06-17 DIAGNOSIS — J449 Chronic obstructive pulmonary disease, unspecified: Secondary | ICD-10-CM

## 2021-06-17 MED ORDER — ALBUTEROL SULFATE HFA 108 (90 BASE) MCG/ACT IN AERS: 2.0000 | INHALATION_SPRAY | RESPIRATORY_TRACT | 3 refills | Status: DC | PRN

## 2021-06-17 MED ORDER — BENZONATATE 100 MG PO CAPS: 200.0000 mg | ORAL_CAPSULE | Freq: Three times a day (TID) | ORAL | 1 refills | Status: DC

## 2021-06-17 NOTE — Progress Notes (Signed)
Sent prescriptions for albuterol rescue inhaler and benzonatate cough capsules to use as needed and as prescribed to [facility] per patient request.

## 2021-07-12 ENCOUNTER — Inpatient Hospital Stay
Admission: EM | Admit: 2021-07-12 | Discharge: 2021-07-14 | DRG: 190 | Disposition: A | Discharge status: Home / Self Care | Payer: Medicare Other | Attending: Internal Medicine | Admitting: Internal Medicine

## 2021-07-12 ENCOUNTER — Emergency Department: Payer: Medicare Other

## 2021-07-12 ENCOUNTER — Other Ambulatory Visit: Payer: Self-pay

## 2021-07-12 DIAGNOSIS — Z91041 Radiographic dye allergy status: Secondary | ICD-10-CM

## 2021-07-12 DIAGNOSIS — Z888 Allergy status to other drugs, medicaments and biological substances status: Secondary | ICD-10-CM

## 2021-07-12 DIAGNOSIS — Z882 Allergy status to sulfonamides status: Secondary | ICD-10-CM

## 2021-07-12 DIAGNOSIS — Z91013 Allergy to seafood: Secondary | ICD-10-CM

## 2021-07-12 DIAGNOSIS — J9621 Acute and chronic respiratory failure with hypoxia: Secondary | ICD-10-CM | POA: Diagnosis present

## 2021-07-12 DIAGNOSIS — Z87891 Personal history of nicotine dependence: Secondary | ICD-10-CM

## 2021-07-12 DIAGNOSIS — E785 Hyperlipidemia, unspecified: Secondary | ICD-10-CM | POA: Diagnosis present

## 2021-07-12 DIAGNOSIS — Z7982 Long term (current) use of aspirin: Secondary | ICD-10-CM

## 2021-07-12 DIAGNOSIS — K219 Gastro-esophageal reflux disease without esophagitis: Secondary | ICD-10-CM | POA: Diagnosis present

## 2021-07-12 DIAGNOSIS — Z79899 Other long term (current) drug therapy: Secondary | ICD-10-CM

## 2021-07-12 DIAGNOSIS — Z881 Allergy status to other antibiotic agents status: Secondary | ICD-10-CM

## 2021-07-12 DIAGNOSIS — Z880 Allergy status to penicillin: Secondary | ICD-10-CM

## 2021-07-12 DIAGNOSIS — Z9981 Dependence on supplemental oxygen: Secondary | ICD-10-CM

## 2021-07-12 DIAGNOSIS — J441 Chronic obstructive pulmonary disease with (acute) exacerbation: Principal | ICD-10-CM | POA: Diagnosis present

## 2021-07-12 DIAGNOSIS — J449 Chronic obstructive pulmonary disease, unspecified: Secondary | ICD-10-CM | POA: Diagnosis present

## 2021-07-12 DIAGNOSIS — Z20822 Contact with and (suspected) exposure to covid-19: Secondary | ICD-10-CM | POA: Diagnosis present

## 2021-07-12 DIAGNOSIS — Z79891 Long term (current) use of opiate analgesic: Secondary | ICD-10-CM

## 2021-07-12 DIAGNOSIS — G4700 Insomnia, unspecified: Secondary | ICD-10-CM | POA: Diagnosis present

## 2021-07-12 DIAGNOSIS — F411 Generalized anxiety disorder: Secondary | ICD-10-CM | POA: Diagnosis present

## 2021-07-12 LAB — COMPREHENSIVE METABOLIC PANEL
ALT: 27 U/L (ref 0–44)
AST: 31 U/L (ref 15–41)
Albumin: 4.4 g/dL (ref 3.5–5.0)
Alkaline Phosphatase: 71 U/L (ref 38–126)
Anion gap: 10 (ref 5–15)
BUN: 6 mg/dL — ABNORMAL LOW (ref 8–23)
CO2: 24 mmol/L (ref 22–32)
Calcium: 9.7 mg/dL (ref 8.9–10.3)
Chloride: 105 mmol/L (ref 98–111)
Creatinine, Ser: 0.6 mg/dL (ref 0.44–1.00)
GFR, Estimated: >60 mL/min (ref >60)
Glucose, Bld: 101 mg/dL — ABNORMAL HIGH (ref 70–99)
Potassium: 3.1 mmol/L — ABNORMAL LOW (ref 3.5–5.1)
Sodium: 139 mmol/L (ref 135–145)
Total Bilirubin: 0.6 mg/dL (ref 0.3–1.2)
Total Protein: 8.1 g/dL (ref 6.5–8.1)

## 2021-07-12 LAB — CBC
HCT: 39.7 % (ref 36.0–46.0)
Hemoglobin: 13.7 g/dL (ref 12.0–15.0)
MCH: 31.7 pg (ref 26.0–34.0)
MCHC: 34.5 g/dL (ref 30.0–36.0)
MCV: 91.9 fL (ref 80.0–100.0)
Platelets: 289 10*3/uL (ref 150–400)
RBC: 4.32 MIL/uL (ref 3.87–5.11)
RDW: 12.9 % (ref 11.5–15.5)
WBC: 10.8 10*3/uL — ABNORMAL HIGH (ref 4.0–10.5)
nRBC: 0 % (ref 0.0–0.2)

## 2021-07-12 LAB — D-DIMER, QUANTITATIVE: D-Dimer, Quant: 0.41 ug/mL-FEU (ref 0.00–0.50)

## 2021-07-12 LAB — RESP PANEL BY RT-PCR (FLU A&B, COVID) ARPGX2
Influenza A by PCR: NEGATIVE
Influenza B by PCR: NEGATIVE
SARS Coronavirus 2 by RT PCR: NEGATIVE

## 2021-07-12 LAB — PROCALCITONIN: Procalcitonin: <0.1 ng/mL

## 2021-07-12 MED ORDER — ASPIRIN EC 81 MG PO TBEC
81.0000 mg | DELAYED_RELEASE_TABLET | Freq: Every day | ORAL | Status: DC
  Administered 2021-07-13: 81 mg via ORAL
  Filled 2021-07-12 (×2): qty 1

## 2021-07-12 MED ORDER — IPRATROPIUM-ALBUTEROL 0.5-2.5 (3) MG/3ML IN SOLN
3.0000 mL | Freq: Once | RESPIRATORY_TRACT | Status: AC
  Administered 2021-07-12: 3 mL via RESPIRATORY_TRACT
  Filled 2021-07-12: qty 3

## 2021-07-12 MED ORDER — ONDANSETRON HCL 4 MG PO TABS: 4.0000 mg | ORAL_TABLET | Freq: Four times a day (QID) | ORAL | Status: DC | PRN

## 2021-07-12 MED ORDER — ACETAMINOPHEN 325 MG PO TABS: 650.0000 mg | ORAL_TABLET | Freq: Four times a day (QID) | ORAL | Status: DC | PRN

## 2021-07-12 MED ORDER — PANTOPRAZOLE SODIUM 40 MG PO TBEC
40.0000 mg | DELAYED_RELEASE_TABLET | Freq: Once | ORAL | Status: DC
  Filled 2021-07-12: qty 1

## 2021-07-12 MED ORDER — PREDNISONE 20 MG PO TABS: 40.0000 mg | ORAL_TABLET | Freq: Every day | ORAL | Status: DC

## 2021-07-12 MED ORDER — ONDANSETRON HCL 4 MG/2ML IJ SOLN
4.0000 mg | Freq: Four times a day (QID) | INTRAMUSCULAR | Status: DC | PRN
  Administered 2021-07-13: 4 mg via INTRAVENOUS
  Filled 2021-07-12: qty 2

## 2021-07-12 MED ORDER — POTASSIUM CHLORIDE CRYS ER 20 MEQ PO TBCR
40.0000 meq | EXTENDED_RELEASE_TABLET | Freq: Once | ORAL | Status: AC
  Administered 2021-07-12: 40 meq via ORAL
  Filled 2021-07-12: qty 2

## 2021-07-12 MED ORDER — PANTOPRAZOLE SODIUM 40 MG PO TBEC
40.0000 mg | DELAYED_RELEASE_TABLET | Freq: Every day | ORAL | Status: DC
  Administered 2021-07-12 – 2021-07-14 (×3): 40 mg via ORAL
  Filled 2021-07-12 (×2): qty 1

## 2021-07-12 MED ORDER — CALCIUM CARBONATE ANTACID 500 MG PO CHEW: 1.0000 | CHEWABLE_TABLET | Freq: Two times a day (BID) | ORAL | Status: DC

## 2021-07-12 MED ORDER — ACETAMINOPHEN 650 MG RE SUPP: 650.0000 mg | Freq: Four times a day (QID) | RECTAL | Status: DC | PRN

## 2021-07-12 MED ORDER — MAGNESIUM SULFATE 2 GM/50ML IV SOLN
2.0000 g | Freq: Once | INTRAVENOUS | Status: AC
  Administered 2021-07-12: 2 g via INTRAVENOUS
  Filled 2021-07-12: qty 50

## 2021-07-12 MED ORDER — MORPHINE SULFATE (PF) 2 MG/ML IV SOLN: 2.0000 mg | INTRAVENOUS | Status: DC | PRN

## 2021-07-12 MED ORDER — BENZONATATE 100 MG PO CAPS
200.0000 mg | ORAL_CAPSULE | Freq: Three times a day (TID) | ORAL | Status: DC
  Administered 2021-07-12 – 2021-07-14 (×5): 200 mg via ORAL
  Filled 2021-07-12 (×6): qty 2

## 2021-07-12 MED ORDER — ENOXAPARIN SODIUM 40 MG/0.4ML IJ SOSY
40.0000 mg | PREFILLED_SYRINGE | INTRAMUSCULAR | Status: DC
  Administered 2021-07-12 – 2021-07-13 (×2): 40 mg via SUBCUTANEOUS
  Filled 2021-07-12 (×2): qty 0.4

## 2021-07-12 MED ORDER — DOXYCYCLINE HYCLATE 100 MG PO TABS
100.0000 mg | ORAL_TABLET | Freq: Two times a day (BID) | ORAL | Status: DC
  Administered 2021-07-12 – 2021-07-13 (×2): 100 mg via ORAL
  Filled 2021-07-12 (×2): qty 1

## 2021-07-12 MED ORDER — UMECLIDINIUM-VILANTEROL 62.5-25 MCG/INH IN AEPB
1.0000 | INHALATION_SPRAY | Freq: Every day | RESPIRATORY_TRACT | Status: DC
  Administered 2021-07-12 – 2021-07-13 (×2): 1 via RESPIRATORY_TRACT
  Filled 2021-07-12: qty 14

## 2021-07-12 MED ORDER — IPRATROPIUM-ALBUTEROL 0.5-2.5 (3) MG/3ML IN SOLN
3.0000 mL | Freq: Four times a day (QID) | RESPIRATORY_TRACT | Status: DC
  Administered 2021-07-12 – 2021-07-14 (×8): 3 mL via RESPIRATORY_TRACT
  Filled 2021-07-12 (×7): qty 3

## 2021-07-12 MED ORDER — TRAZODONE HCL 50 MG PO TABS
50.0000 mg | ORAL_TABLET | Freq: Every day | ORAL | Status: DC
  Administered 2021-07-12: 50 mg via ORAL
  Filled 2021-07-12 (×2): qty 1

## 2021-07-12 MED ORDER — HYDROCODONE-ACETAMINOPHEN 5-325 MG PO TABS: 1.0000 | ORAL_TABLET | ORAL | Status: DC | PRN

## 2021-07-12 MED ORDER — POLYETHYLENE GLYCOL 3350 17 G PO PACK: 17.0000 g | PACK | Freq: Every day | ORAL | Status: DC | PRN

## 2021-07-12 MED ORDER — METHYLPREDNISOLONE SODIUM SUCC 125 MG IJ SOLR
80.0000 mg | INTRAMUSCULAR | Status: AC
Start: 2021-07-12 — End: 2021-07-13
  Administered 2021-07-12 – 2021-07-13 (×2): 80 mg via INTRAVENOUS
  Filled 2021-07-12 (×2): qty 2

## 2021-07-12 MED ORDER — GUAIFENESIN ER 600 MG PO TB12
600.0000 mg | ORAL_TABLET | Freq: Two times a day (BID) | ORAL | Status: DC
  Administered 2021-07-12 – 2021-07-13 (×3): 600 mg via ORAL
  Filled 2021-07-12 (×4): qty 1

## 2021-07-12 NOTE — ED Notes (Signed)
Patient transported to CT 

## 2021-07-12 NOTE — ED Provider Notes (Signed)
[facility] Emergency Department Provider Note   ____________________________________________    I have reviewed the triage vital signs and the nursing notes.   HISTORY  Chief Complaint Shortness of Breath     HPI Stephanie Hess is a 70 y.o. female with history of COPD presents with complaints of shortness of breath.  Patient reports it has been worsening over the last several weeks but her breathing worsened overnight significantly.  She reports feeling better after being treated with duo nebs and Solu-Medrol via EMS.  She does use oxygen at night and occasionally as needed.  She sees a pulmonologist at [facility].  Denies fevers chills or cough.  No calf pain or swelling.  No pleurisy.  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   Left upper arm pain 02/13/2020   Acute upper respiratory infection 12/20/2018   Sore throat 12/20/2018   Fever and chills 12/20/2018   Hemoptysis 12/20/2018   Urinary tract infection without hematuria 11/25/2018   Dysuria 11/25/2018   Respiratory distress 07/16/2018   Sternoclavicular joint pain 04/01/2018   Bronchiectasis with acute lower respiratory infection ([place]) 03/03/2018   Nicotine abuse 03/03/2018   Mild intermittent asthma without complication [id number]   Pulmonary nodule 03/03/2018   Gastroesophageal reflux disease without esophagitis 03/03/2018   Hyperlipidemia 05/15/2017    Vitamin D deficiency 05/15/2017    Past Surgical History:  Procedure Laterality Date   APPENDECTOMY     CESAREAN SECTION     ECTOPIC PREGNANCY SURGERY      Prior to Admission medications   Medication Sig Start Date End Date Taking? Authorizing Provider  albuterol (VENTOLIN HFA) 108 (90 Base) MCG/ACT inhaler Inhale 2 puffs into the lungs every 4 (four) hours as needed for wheezing or shortness of breath. 06/17/21   Ronnell Freshwater, NP  aspirin EC 81 MG tablet Take 1 tablet (81 mg total) by mouth daily. 06/04/16   Wende Bushy, MD  azithromycin (ZITHROMAX) 250 MG tablet z-pack - take as directed for 5 days 04/17/21   Ronnell Freshwater, NP  benzonatate (TESSALON) 100 MG capsule Take 2 capsules (200 mg total) by mouth every 8 (eight) hours. 06/17/21   Ronnell Freshwater, NP  calcium-vitamin D (OSCAL WITH D) 500-200 MG-UNIT tablet Take 1 tablet by mouth.    [provider]  ibuprofen (ADVIL,MOTRIN) 800 MG tablet Take 1 tablet (800 mg total) by mouth every 8 (eight) hours as needed. 10/15/18   Kendell Bane, NP  methylPREDNISolone (MEDROL) 4 MG TBPK tablet Take by mouth as directed for 6 days 04/17/21   Ronnell Freshwater, NP  omeprazole (PRILOSEC) 20 MG capsule Take 20 mg by mouth daily.    [provider]  OXYGEN Inhale into the lungs. 2 litre at night    [provider]  traZODone (DESYREL) 50 MG tablet  03/18/21   [provider]     Allergies  Penicillins, Shellfish allergy, Sulfa antibiotics, Biaxin [clarithromycin], Amoxicillin-pot clavulanate, Iodinated diagnostic agents, Atorvastatin, and Macrobid [nitrofurantoin]  Family History  Problem Relation Age of Onset   Breast cancer Paternal Aunt    Heart attack Mother     Social History Social History   Tobacco Use   Smoking status: Former    Packs/day: 0.50    Years: 50.00    Pack years: 25.00    Types: Cigarettes   Smokeless tobacco: Never  Vaping Use   Vaping Use: Never used  Substance Use  Topics   Alcohol use: Yes    Comment: social   Drug use: No    Review of Systems  Constitutional: No fever/chills Eyes: No visual changes.  ENT: No sore throat. Cardiovascular: Denies chest pain. Respiratory: As above Gastrointestinal: No abdominal pain.  No nausea, no vomiting.   Genitourinary: Negative for dysuria. Musculoskeletal: Negative for back pain. Skin: Negative for rash. Neurological: Negative for headaches or weakness   ____________________________________________   PHYSICAL EXAM:  VITAL SIGNS: ED Triage Vitals  Enc Vitals Group     BP 07/12/21 1200 (!) 154/98     Pulse Rate 07/12/21 1200 86     Resp 07/12/21 1207 (!) 21     Temp 07/12/21 1207 98 F (36.7 C)     Temp Source 07/12/21 1207 Oral     SpO2 07/12/21 1200 98 %     Weight 07/12/21 1201 69.9 kg (154 lb)     Height 07/12/21 1201 1.575 m ('5\' 2"'$ )     Head Circumference --      Peak Flow --      Pain Score 07/12/21 1201 0     Pain Loc --      Pain Edu? --      Excl. in [place]? --     Constitutional: Alert and oriented.  Eyes: Conjunctivae are normal.  Head: Atraumatic. Nose: No congestion/rhinnorhea. Mouth/Throat: Mucous membranes are moist.   Neck:  Painless ROM Cardiovascular: Normal rate, regular rhythm. Grossly normal heart sounds.  Good peripheral circulation. Respiratory: Mild tachypnea no retractions. scattered wheezes Gastrointestinal: Soft and nontender. No distention.  No CVA tenderness.  Musculoskeletal: No lower extremity tenderness nor edema.  Warm and well perfused Neurologic:  Normal speech and language. No gross focal neurologic deficits are appreciated.  Skin:  Skin is warm, dry and intact. No rash noted. Psychiatric: Mood and affect are normal. Speech and behavior are normal.  ____________________________________________   LABS (all labs ordered are listed, but only abnormal results are displayed)  Labs Reviewed  CBC - Abnormal; Notable for the following components:       Result Value   WBC 10.8 (*)    All other components within normal limits  COMPREHENSIVE METABOLIC PANEL - Abnormal; Notable for the following components:   Potassium 3.1 (*)    Glucose, Bld 101 (*)    BUN 6 (*)    All other components within normal limits  D-DIMER, QUANTITATIVE (NOT AT [facility])   ____________________________________________  EKG    ____________________________________________  RADIOLOGY  Chest x-ray reviewed by me, no evidence of pneumonia ____________________________________________   PROCEDURES  Procedure(s) performed: No  Procedures   Critical Care performed: No ____________________________________________   INITIAL IMPRESSION / ASSESSMENT AND PLAN / ED COURSE  Pertinent labs & imaging results that were available during my care of the patient were reviewed by me and considered in my medical decision making (see chart for details).   Patient presents with presentation most consistent with  COPD exacerbation.  She is afebrile, no significant cough.  No pleurisy or chest pain to suggest PE.  Improved after DuoNeb's and Solu-Medrol, will give additional DuoNeb here, IV magnesium, check labs chest x-ray and reevaluate  ----------------------------------------- 2:55 PM on 07/12/2021 ----------------------------------------- Patient with continued increased work of breathing, also complaining of a feeling that something is in her throat and some difficulty swallowing.  Pharyngeal exam is normal.  Will obtain CT soft tissue neck patient will need reeval after imaging.    ____________________________________________   FINAL CLINICAL IMPRESSION(S) / ED DIAGNOSES  Final diagnoses:  COPD exacerbation ([place])        Note:  This document was prepared using Dragon voice recognition software and may include unintentional dictation errors.    Lavonia Drafts, MD 07/12/21 1455

## 2021-07-12 NOTE — H&P (Signed)
History and Physical    JERSIE TOSTO [id number] DOB: [birth year] DOA: 07/12/2021  PCP: Ronnell Freshwater, NP  Chief Complaint: Shortness of breath  HPI: Stephanie Hess is a 70 y.o. female with a past medical history of COPD with chronic hypoxic respiratory failure on 2 L oxygen at night and as needed during the day, GERD, insomnia, former tobacco use.  This patient presents to the emergency department due to shortness of breath started approximately 6:30 AM at home.  EMS gave her Solu-Medrol 125 mg, DuoNeb treatment and an albuterol treatment.  She denies any fevers or chills.  In the emergency department they attempted to ambulate her and she got tachycardic and tachypneic.  She denies any purulent cough.  Occasional dry cough.  Denies any sore throat.  Denies any fevers or chills.  No abdominal pain, nausea, vomiting, diarrhea.  ED Course: She has been given 2 DuoNeb breathing treatments, magnesium sulfate 2 g IV x1 and Klor-Con 40 mEq p.o. Chest x-ray did not show any infiltrate or consolidation.  CT soft tissue neck was done due to a fullness feeling in her throat which was negative.  COVID-19 PCR was negative.  Potassium was 3.1 which is already been repleted. Leukocytosis of 10.  D-dimer was negative and procalcitonin was unremarkable.  Review of Systems: 14 point review of systems is negative except for what is mentioned above in the HPI.   Past Medical History:  Diagnosis Date   Cancer ([place])    COPD (chronic obstructive pulmonary disease) ([place])    Diverticula of colon    GERD (gastroesophageal reflux disease)    Hyperlipidemia    Renal insufficiency    2009 or 2010    Past Surgical History:  Procedure Laterality Date   APPENDECTOMY     CESAREAN SECTION     ECTOPIC PREGNANCY SURGERY      Social History   Socioeconomic History   Marital status: Divorced    Spouse name: Not on file   Number of children: Not on file   Years of education: Not on file   Highest  education level: Not on file  Occupational History   Not on file  Tobacco Use   Smoking status: Former    Packs/day: 0.50    Years: 50.00    Pack years: 25.00    Types: Cigarettes   Smokeless tobacco: Never  Vaping Use   Vaping Use: Never used  Substance and Sexual Activity   Alcohol use: Yes    Comment: social   Drug use: No   Sexual activity: Not Currently  Other Topics Concern   Not on file  Social History Narrative   Not on file   Social Determinants of Health   Financial Resource Strain: Not on file  Food Insecurity: Not on file  Transportation Needs: Not on file  Physical Activity: Not on file  Stress: Not on file  Social Connections: Not on file  Intimate Partner Violence: Not on file    Allergies  Allergen Reactions   Penicillins Nausea And Vomiting and Other (See Comments)    Has patient had a PCN reaction causing immediate rash, facial/tongue/throat swelling, SOB or lightheadedness with hypotension: No Has patient had a PCN reaction causing severe rash involving mucus membranes or skin necrosis: No Has patient had a PCN reaction that required hospitalization: No Has patient had a PCN reaction occurring within the last 10 years: Yes If all of the above answers are "NO", then may proceed with Cephalosporin  use.    Shellfish Allergy Nausea And Vomiting   Sulfa Antibiotics Rash and Other (See Comments)    Other reaction(s): Unknown   Clarithromycin Rash and Nausea Only    rash   Amoxicillin-Pot Clavulanate Nausea And Vomiting   Iodinated Diagnostic Agents Other (See Comments)    acute renal insufficiency acute renal insufficiency   Atorvastatin Rash   Macrobid [Nitrofurantoin] Rash    Family History  Problem Relation Age of Onset   Breast cancer Paternal Aunt    Heart attack Mother     Prior to Admission medications   Medication Sig Start Date End Date Taking? Authorizing Provider  albuterol (VENTOLIN HFA) 108 (90 Base) MCG/ACT inhaler Inhale 2  puffs into the lungs every 4 (four) hours as needed for wheezing or shortness of breath. 06/17/21  Yes Ronnell Freshwater, NP  aspirin EC 81 MG tablet Take 1 tablet (81 mg total) by mouth daily. 06/04/16  Yes Wende Bushy, MD  benzonatate (TESSALON) 100 MG capsule Take 2 capsules (200 mg total) by mouth every 8 (eight) hours. 06/17/21  Yes Boscia, Greer Ee, NP  calcium-vitamin D (OSCAL WITH D) 500-200 MG-UNIT tablet Take 1 tablet by mouth.   Yes [provider]  omeprazole (PRILOSEC) 20 MG capsule Take 20 mg by mouth daily.   Yes [provider]  traZODone (DESYREL) 50 MG tablet Take 50 mg by mouth at bedtime. 03/18/21  Yes [provider]  OXYGEN Inhale into the lungs. 2 litre at night    [provider]    Physical Exam: Vitals:   07/12/21 1800 07/12/21 2000 07/12/21 2030 07/12/21 2055  BP: 123/67 138/70 129/81   Pulse: (!) 111 99 (!) 104 (!) 101  Resp: 20 (!) '21 17 20  '$ Temp:      TempSrc:      SpO2: 97% 95% 95% 95%  Weight:      Height:         General:  Appears calm and comfortable and is in NAD Cardiovascular:  RRR, no m/r/g.  Respiratory: Rhonchorous breath sounds on lung exam.  Normal respiratory effort. Abdomen:  soft, NT, ND, NABS Skin:  no rash or induration seen on limited exam Musculoskeletal:  grossly normal tone BUE/BLE, good ROM, no bony abnormality Lower extremity:  No LE edema.  Limited foot exam with no ulcerations.  2+ distal pulses. Psychiatric:  grossly normal mood and affect, speech fluent and appropriate, AOx3 Neurologic:  CN 2-12 grossly intact, moves all extremities in coordinated fashion, sensation intact    Radiological Exams on Admission: Independently reviewed - see discussion in A/P where applicable  CT SOFT TISSUE NECK WO CONTRAST  Result Date: 07/12/2021 CLINICAL DATA:  Neck abscess, deep tissue. Shortness of breath. Difficulty swallowing and globus sensation. EXAM: CT NECK WITHOUT CONTRAST TECHNIQUE: Multidetector  CT imaging of the neck was performed following the standard protocol without intravenous contrast. COMPARISON:  None. FINDINGS: Pharynx and larynx: Mild motion artifact. No evidence of mass or swelling. No fluid collection or significant inflammatory changes in the parapharyngeal or retropharyngeal spaces. Salivary glands: No inflammation, mass, or stone. Thyroid: Unremarkable. Lymph nodes: No enlarged or suspicious lymph nodes in the neck. Vascular: Mild calcific atherosclerosis at the carotid bifurcations. Limited intracranial: Unremarkable. Visualized orbits: Unremarkable. Mastoids and visualized paranasal sinuses: Scattered mild mucosal thickening in the ethmoid air cells bilaterally. Clear mastoid air cells. Skeleton: Mild disc and moderate facet degeneration in the cervical and upper thoracic spine. Upper chest: Emphysema. Other: None. IMPRESSION: 1.  No acute abnormality identified in the neck. 2. Emphysema (ICD10-J43.9). Electronically Signed   By: Logan Bores M.D.   On: 07/12/2021 17:17   DG Chest Port 1 View  Result Date: 07/12/2021 CLINICAL DATA:  Shortness of breath, COPD EXAM: PORTABLE CHEST 1 VIEW COMPARISON:  04/05/2021 FINDINGS: The heart size and mediastinal contours are within normal limits. Band-like opacities in the left lung base, likely atelectasis. Chronically coarsened interstitial markings bilaterally. No pleural effusion or pneumothorax. Degenerative changes of the shoulders, right worse than left. IMPRESSION: Band-like opacities in the left lung base, likely atelectasis. Electronically Signed   By: Davina Poke D.O.   On: 07/12/2021 13:32     Labs on Admission: I have personally reviewed the available labs and imaging studies at the time of the admission.  Pertinent labs: WBC 10, potassium 3.1, blood glucose 101     Assessment/Plan: Acute hypoxic respiratory failure secondary to COPD exacerbation: Start IV steroids, scheduled DuoNeb breathing treatments.  Start  doxycycline.  Chest x-ray shows no evidence of pneumonia.  Wean oxygen as tolerated.  Currently on 2 L and saturating well.  Supportive care with Mucinex.  Start scheduled Anoro Ellipta inhaler.  Does not appear to be on any COPD maintenance inhaler at home.  Recommend discharging with a controller inhaler for her COPD.  GERD: Continue home Prilosec  Insomnia: Continue home trazodone  Level of Care: MedSurg DVT prophylaxis: Lovenox subcu Code Status: Full code Consults: None Admission status: Observation   Leslee Home DO [facility]   How to contact the [facility] Attending or Consulting provider [place] or covering provider during after hours [place], for this patient?  Check the care team in [facility] and look for a) attending/consulting TR[facility] provider listed and b) the [facility] team listed Log into [email or web address] and use 's universal password to access. If you do not have the password, please contact the hospital operator. Locate the [facility] provider you are looking for under [facility] and page to a number that you can be directly reached. If you still have difficulty reaching the provider, please page the [facility] (Director on Call) for the Hospitalists listed on amion for assistance.   07/12/2021, 9:09 PM

## 2021-07-12 NOTE — ED Triage Notes (Signed)
Pt BIBA from home for [facility] since 0630 this morning. Pt hx COPD. EMS gave 125 solumedrol, duoneb, and albuterol. Pt states SHOB has improved some.   HR 94 RR 21 BP 196/91 CBG 146

## 2021-07-13 ENCOUNTER — Other Ambulatory Visit: Payer: Self-pay | Admitting: Family Medicine

## 2021-07-13 DIAGNOSIS — Z79899 Other long term (current) drug therapy: Secondary | ICD-10-CM | POA: Diagnosis not present

## 2021-07-13 DIAGNOSIS — R0600 Dyspnea, unspecified: Secondary | ICD-10-CM

## 2021-07-13 DIAGNOSIS — Z9981 Dependence on supplemental oxygen: Secondary | ICD-10-CM | POA: Diagnosis not present

## 2021-07-13 DIAGNOSIS — Z7982 Long term (current) use of aspirin: Secondary | ICD-10-CM | POA: Diagnosis not present

## 2021-07-13 DIAGNOSIS — Z91041 Radiographic dye allergy status: Secondary | ICD-10-CM | POA: Diagnosis not present

## 2021-07-13 DIAGNOSIS — Z87891 Personal history of nicotine dependence: Secondary | ICD-10-CM | POA: Diagnosis not present

## 2021-07-13 DIAGNOSIS — Z91013 Allergy to seafood: Secondary | ICD-10-CM | POA: Diagnosis not present

## 2021-07-13 DIAGNOSIS — J438 Other emphysema: Secondary | ICD-10-CM | POA: Diagnosis not present

## 2021-07-13 DIAGNOSIS — K219 Gastro-esophageal reflux disease without esophagitis: Secondary | ICD-10-CM | POA: Diagnosis present

## 2021-07-13 DIAGNOSIS — J449 Chronic obstructive pulmonary disease, unspecified: Secondary | ICD-10-CM | POA: Diagnosis present

## 2021-07-13 DIAGNOSIS — Z888 Allergy status to other drugs, medicaments and biological substances status: Secondary | ICD-10-CM | POA: Diagnosis not present

## 2021-07-13 DIAGNOSIS — Z20822 Contact with and (suspected) exposure to covid-19: Secondary | ICD-10-CM | POA: Diagnosis present

## 2021-07-13 DIAGNOSIS — J9621 Acute and chronic respiratory failure with hypoxia: Secondary | ICD-10-CM | POA: Diagnosis present

## 2021-07-13 DIAGNOSIS — Z880 Allergy status to penicillin: Secondary | ICD-10-CM | POA: Diagnosis not present

## 2021-07-13 DIAGNOSIS — Z882 Allergy status to sulfonamides status: Secondary | ICD-10-CM | POA: Diagnosis not present

## 2021-07-13 DIAGNOSIS — E785 Hyperlipidemia, unspecified: Secondary | ICD-10-CM | POA: Diagnosis present

## 2021-07-13 DIAGNOSIS — F411 Generalized anxiety disorder: Secondary | ICD-10-CM | POA: Diagnosis present

## 2021-07-13 DIAGNOSIS — G4700 Insomnia, unspecified: Secondary | ICD-10-CM | POA: Diagnosis present

## 2021-07-13 DIAGNOSIS — Z881 Allergy status to other antibiotic agents status: Secondary | ICD-10-CM | POA: Diagnosis not present

## 2021-07-13 DIAGNOSIS — J441 Chronic obstructive pulmonary disease with (acute) exacerbation: Secondary | ICD-10-CM | POA: Diagnosis present

## 2021-07-13 DIAGNOSIS — Z79891 Long term (current) use of opiate analgesic: Secondary | ICD-10-CM | POA: Diagnosis not present

## 2021-07-13 LAB — RESPIRATORY PANEL BY PCR

## 2021-07-13 LAB — CBC
HCT: 38.7 % (ref 36.0–46.0)
Hemoglobin: 13.4 g/dL (ref 12.0–15.0)
MCH: 31 pg (ref 26.0–34.0)
MCHC: 34.6 g/dL (ref 30.0–36.0)
MCV: 89.6 fL (ref 80.0–100.0)
Platelets: 311 10*3/uL (ref 150–400)
RBC: 4.32 MIL/uL (ref 3.87–5.11)
RDW: 12.9 % (ref 11.5–15.5)
WBC: 13.1 10*3/uL — ABNORMAL HIGH (ref 4.0–10.5)
nRBC: 0 % (ref 0.0–0.2)

## 2021-07-13 LAB — BASIC METABOLIC PANEL
Anion gap: 8 (ref 5–15)
BUN: 10 mg/dL (ref 8–23)
CO2: 22 mmol/L (ref 22–32)
Calcium: 9.8 mg/dL (ref 8.9–10.3)
Chloride: 108 mmol/L (ref 98–111)
Creatinine, Ser: 0.55 mg/dL (ref 0.44–1.00)
GFR, Estimated: >60 mL/min (ref >60)
Glucose, Bld: 124 mg/dL — ABNORMAL HIGH (ref 70–99)
Potassium: 4.2 mmol/L (ref 3.5–5.1)
Sodium: 138 mmol/L (ref 135–145)

## 2021-07-13 LAB — HIV ANTIBODY (ROUTINE TESTING W REFLEX): HIV Screen 4th Generation wRfx: NONREACTIVE

## 2021-07-13 LAB — STREP PNEUMONIAE URINARY ANTIGEN: Strep Pneumo Urinary Antigen: NEGATIVE

## 2021-07-13 LAB — CBG MONITORING, ED: Glucose-Capillary: 103 mg/dL — ABNORMAL HIGH (ref 70–99)

## 2021-07-13 LAB — TROPONIN I (HIGH SENSITIVITY): Troponin I (High Sensitivity): 9 ng/L (ref <18)

## 2021-07-13 MED ORDER — SODIUM CHLORIDE 0.9 % IV SOLN: INTRAVENOUS | Status: AC

## 2021-07-13 MED ORDER — LORAZEPAM 2 MG/ML IJ SOLN
1.0000 mg | Freq: Once | INTRAMUSCULAR | Status: AC
  Administered 2021-07-13: 1 mg via INTRAVENOUS
  Filled 2021-07-13: qty 1

## 2021-07-13 MED ORDER — ENSURE ENLIVE PO LIQD
237.0000 mL | Freq: Two times a day (BID) | ORAL | Status: DC
  Administered 2021-07-14: 237 mL via ORAL

## 2021-07-13 MED ORDER — ADULT MULTIVITAMIN W/MINERALS CH
1.0000 | ORAL_TABLET | Freq: Every day | ORAL | Status: DC
  Administered 2021-07-14: 1 via ORAL
  Filled 2021-07-13: qty 1

## 2021-07-13 MED ORDER — MORPHINE SULFATE (PF) 4 MG/ML IV SOLN
4.0000 mg | Freq: Once | INTRAVENOUS | Status: AC
Start: 2021-07-13 — End: 2021-07-13
  Administered 2021-07-13: 4 mg via INTRAVENOUS
  Filled 2021-07-13: qty 1

## 2021-07-13 MED ORDER — AZITHROMYCIN 250 MG PO TABS
500.0000 mg | ORAL_TABLET | Freq: Every day | ORAL | Status: DC
  Administered 2021-07-13 – 2021-07-14 (×2): 500 mg via ORAL
  Filled 2021-07-13 (×2): qty 2

## 2021-07-13 NOTE — Progress Notes (Signed)
PROGRESS NOTE    Stephanie Hess  [id number] DOB: [birth year] DOA: 07/12/2021 PCP: Ronnell Freshwater, NP    Chief Complaint  Patient presents with   Shortness of Breath    Brief Narrative:  Stephanie Hess is a 70 y.o. female with a past medical history of COPD with chronic hypoxic respiratory failure on 2 L oxygen at night and as needed during the day, GERD, insomnia, former tobacco use (quit on 06/29/2021)   This patient presents to the emergency department due to shortness of breath started approximately 6:30 AM at home.  EMS gave her Solu-Medrol 125 mg, DuoNeb treatment and an albuterol treatment  ED Course: She has been given 2 DuoNeb breathing treatments, magnesium sulfate 2 g IV x1 and Klor-Con 40 mEq p.o. Chest x-ray did not show any infiltrate or consolidation.  CT soft tissue neck was done due to a fullness feeling in her throat which was negative.  COVID-19 PCR was negative.  Potassium was 3.1 which is already been repleted. Leukocytosis of 10.  D-dimer was negative and procalcitonin was unremarkable.    Subjective:  Continue to have  congested cough, reports cough is productive , sputum was white in the past, today sputum is yellow  Continue to wheeze, no fever  No chest pain, no edema  Assessment & Plan:   Active Problems:   COPD with acute exacerbation (HCC)  Copd exacerbation Acute on chronic hypoxic respiratory failure Was on home o2 night only, but recently needed 24/7 since last week Currently on 4 L at rest Continue steroid, change doxycycline to Zithromax, continue Mucinex and nebulizer treatment Wean oxygen as tolerated  Chronic cough/dyspnea Wonder if acid reflux could play a role Avoid doxycycline, have to use steroids for wheezing  GERD Continue Protonix   Body mass index is 28.17 kg/m.Marland Kitchen     Unresulted Labs (From admission, onward)     Start     Ordered   07/13/21 1307  Respiratory (~20 pathogens) panel by PCR  (Respiratory panel by PCR  (~20 pathogens, ~24 hr TAT)  w precautions)  Once,   R        07/13/21 1306   07/13/21 1306  Expectorated Sputum Assessment w Gram Stain, Rflx to Resp Cult  Once,   R        07/13/21 1305   07/13/21 1306  Strep pneumoniae urinary antigen  Once,   R        07/13/21 1305              DVT prophylaxis: enoxaparin (LOVENOX) injection 40 mg Start: 07/12/21 2200   Code Status: Full Family Communication: Patient Disposition:    Dispo: The patient is from: Home              Anticipated d/c is to: Home              Anticipated d/c date is: Possibly 24 to 48 hours                Consultants:  None  Procedures:  None  Antimicrobials:   Anti-infectives (From admission, onward)    Start     Dose/Rate Route Frequency Ordered Stop   07/13/21 1400  azithromycin (ZITHROMAX) tablet 500 mg        500 mg Oral Daily 07/13/21 1317     07/12/21 2200  doxycycline (VIBRA-TABS) tablet 100 mg  Status:  Discontinued        100 mg Oral Every 12 hours 07/12/21  2108 07/13/21 1312           Objective: Vitals:   07/13/21 0600 07/13/21 0700 07/13/21 0733 07/13/21 1026  BP:   124/67 107/66  Pulse: (!) 112 99 81 99  Resp: (!) '22 16 19 18  '$ Temp:   97.7 F (36.5 C) (!) 97.3 F (36.3 C)  TempSrc:   Oral   SpO2: 92% 96% 93% 94%  Weight:      Height:        Intake/Output Summary (Last 24 hours) at 07/13/2021 1307 Last data filed at 07/13/2021 0303 Gross per 24 hour  Intake 420 ml  Output 350 ml  Net 70 ml   Filed Weights   07/12/21 1201  Weight: 69.9 kg    Examination:  General exam: appear dehydrated, weak, frequent congested cough, need to stop to breath during conversation , no accessory muscle use Respiratory system: bilateral wheezing, coarse, rhonchi. Slight tachypnea Cardiovascular system: S1 & S2 heard, RRR. No JVD, no murmur, No pedal edema. Gastrointestinal system: Abdomen is nondistended, soft and nontender. No organomegaly or masses felt. Normal bowel sounds  heard. Central nervous system: Alert and oriented. No focal neurological deficits. Extremities: Symmetric 5 x 5 power. Skin: No rashes, lesions or ulcers Psychiatry: Judgement and insight appear normal. Mood & affect appropriate.     Data Reviewed: I have personally reviewed following labs and imaging studies  CBC: Recent Labs  Lab 07/12/21 1205 07/13/21 0200  WBC 10.8* 13.1*  HGB 13.7 13.4  HCT 39.7 38.7  MCV 91.9 89.6  PLT 289 [id number]    Basic Metabolic Panel: Recent Labs  Lab 07/12/21 1205 07/13/21 0200  NA 139 138  K 3.1* 4.2  CL 105 108  CO2 24 22  GLUCOSE 101* 124*  BUN 6* 10  CREATININE 0.60 0.55  CALCIUM 9.7 9.8    GFR: Estimated Creatinine Clearance: 61.6 mL/min (by C-G formula based on SCr of 0.55 mg/dL).  Liver Function Tests: Recent Labs  Lab 07/12/21 1205  AST 31  ALT 27  ALKPHOS 71  BILITOT 0.6  PROT 8.1  ALBUMIN 4.4    CBG: Recent Labs  Lab 07/13/21 0730  GLUCAP 103*     Recent Results (from the past 240 hour(s))  Resp Panel by RT-PCR (Flu A&B, Covid) Nasopharyngeal Swab     Status: None   Collection Time: 07/12/21  3:40 PM   Specimen: Nasopharyngeal Swab; Nasopharyngeal(NP) swabs in vial transport medium  Result Value Ref Range Status   SARS Coronavirus 2 by RT PCR NEGATIVE NEGATIVE Final    Comment: (NOTE) SARS-CoV-2 target nucleic acids are NOT DETECTED.  The SARS-CoV-2 RNA is generally detectable in upper respiratory specimens during the acute phase of infection. The lowest concentration of SARS-CoV-2 viral copies this assay can detect is 138 copies/mL. A negative result does not preclude SARS-Cov-2 infection and should not be used as the sole basis for treatment or other patient management decisions. A negative result may occur with  improper specimen collection/handling, submission of specimen other than nasopharyngeal swab, presence of viral mutation(s) within the areas targeted by this assay, and inadequate number of  viral copies(<138 copies/mL). A negative result must be combined with clinical observations, patient history, and epidemiological information. The expected result is Negative.  Fact Sheet for Patients:  EntrepreneurPulse.com.au  Fact Sheet for Healthcare Providers:  IncredibleEmployment.be  This test is no t yet approved or cleared by the [place] FDA and  has been authorized for detection and/or diagnosis of SARS-CoV-2  by FDA under an Emergency Use Authorization (EUA). This EUA will remain  in effect (meaning this test can be used) for the duration of the COVID-19 declaration under Section 564(b)(1) of the Act, 21 U.S.C.section 360bbb-3(b)(1), unless the authorization is terminated  or revoked sooner.       Influenza A by PCR NEGATIVE NEGATIVE Final   Influenza B by PCR NEGATIVE NEGATIVE Final    Comment: (NOTE) The Xpert Xpress SARS-CoV-2/FLU/RSV plus assay is intended as an aid in the diagnosis of influenza from Nasopharyngeal swab specimens and should not be used as a sole basis for treatment. Nasal washings and aspirates are unacceptable for Xpert Xpress SARS-CoV-2/FLU/RSV testing.  Fact Sheet for Patients: EntrepreneurPulse.com.au  Fact Sheet for Healthcare Providers: IncredibleEmployment.be  This test is not yet approved or cleared by the [place] FDA and has been authorized for detection and/or diagnosis of SARS-CoV-2 by FDA under an Emergency Use Authorization (EUA). This EUA will remain in effect (meaning this test can be used) for the duration of the COVID-19 declaration under Section 564(b)(1) of the Act, 21 U.S.C. section 360bbb-3(b)(1), unless the authorization is terminated or revoked.  Performed at [facility], [address] [place], [place] [address]          Radiology Studies: CT SOFT TISSUE NECK WO CONTRAST  Result Date: 07/12/2021 CLINICAL DATA:  Neck  abscess, deep tissue. Shortness of breath. Difficulty swallowing and globus sensation. EXAM: CT NECK WITHOUT CONTRAST TECHNIQUE: Multidetector CT imaging of the neck was performed following the standard protocol without intravenous contrast. COMPARISON:  None. FINDINGS: Pharynx and larynx: Mild motion artifact. No evidence of mass or swelling. No fluid collection or significant inflammatory changes in the parapharyngeal or retropharyngeal spaces. Salivary glands: No inflammation, mass, or stone. Thyroid: Unremarkable. Lymph nodes: No enlarged or suspicious lymph nodes in the neck. Vascular: Mild calcific atherosclerosis at the carotid bifurcations. Limited intracranial: Unremarkable. Visualized orbits: Unremarkable. Mastoids and visualized paranasal sinuses: Scattered mild mucosal thickening in the ethmoid air cells bilaterally. Clear mastoid air cells. Skeleton: Mild disc and moderate facet degeneration in the cervical and upper thoracic spine. Upper chest: Emphysema. Other: None. IMPRESSION: 1. No acute abnormality identified in the neck. 2. Emphysema (ICD10-J43.9). Electronically Signed   By: Logan Bores M.D.   On: 07/12/2021 17:17   DG Chest Port 1 View  Result Date: 07/12/2021 CLINICAL DATA:  Shortness of breath, COPD EXAM: PORTABLE CHEST 1 VIEW COMPARISON:  04/05/2021 FINDINGS: The heart size and mediastinal contours are within normal limits. Band-like opacities in the left lung base, likely atelectasis. Chronically coarsened interstitial markings bilaterally. No pleural effusion or pneumothorax. Degenerative changes of the shoulders, right worse than left. IMPRESSION: Band-like opacities in the left lung base, likely atelectasis. Electronically Signed   By: Davina Poke D.O.   On: 07/12/2021 13:32        Scheduled Meds:  aspirin EC  81 mg Oral Daily   benzonatate  200 mg Oral Q8H   doxycycline  100 mg Oral Q12H   enoxaparin (LOVENOX) injection  40 mg Subcutaneous Q24H   guaiFENesin  600 mg  Oral BID   ipratropium-albuterol  3 mL Nebulization Q6H   methylPREDNISolone (SOLU-MEDROL) injection  80 mg Intravenous Q24H   Followed by   Derrill Memo ON 07/14/2021] predniSONE  40 mg Oral Q breakfast   pantoprazole  40 mg Oral Daily   pantoprazole  40 mg Oral Once   traZODone  50 mg Oral QHS   umeclidinium-vilanterol  1 puff Inhalation  Daily   Continuous Infusions:   LOS: 0 days   Time spent: 67mns Greater than 50% of this time was spent in counseling, explanation of diagnosis, planning of further management, and coordination of care.   Voice Recognition /Viviann Sparedictation system was used to create this note, attempts have been made to correct errors. Please contact the author with questions and/or clarifications.   FFlorencia Reasons MD PhD FACP [facility]  Available via Epic secure chat 7am-7pm for nonurgent issues Please page for urgent issues To page the attending provider between 7A-7P or the covering provider during after hours 7P-7A, please log into the web site [email or web address] and access using universal [place] password for that web site. If you do not have the password, please call the hospital operator.    07/13/2021, 1:07 PM

## 2021-07-13 NOTE — Progress Notes (Signed)
Initial Nutrition Assessment  DOCUMENTATION CODES:   Not applicable  INTERVENTION:   Ensure Enlive po BID, each supplement provides 350 kcal and 20 grams of protein  MVI po daily   NUTRITION DIAGNOSIS:   Increased nutrient needs related to catabolic illness (COPD) as evidenced by estimated needs.  GOAL:   Patient will meet greater than or equal to 90% of their needs  MONITOR:   PO intake, Supplement acceptance, Labs, Weight trends, Skin, I & O's  REASON FOR ASSESSMENT:   Malnutrition Screening Tool    ASSESSMENT:   70 y/o female with h/o COPD, HLD, GERD, B12 deficiency and GERD who is admitted with COPD exacerbation.  Met with pt in room today. Pt reports good appetite and oral intake at baseline but reports that her oral intake has been decreased for several weeks. Pt has not yet started drinking any supplements at home. Pt reports eating about 50% of her lunch today. Pt is having some shortness of breath during RD visit. Per chart, pt appears weight stable at baseline. RD will add supplements and MVI to help pt meet her estimated needs. RD discussed with pt the importance of adequate nutrition needed to preserve lean muscle.   Medications reviewed and include: aspirin, azithromycin, lovenox, prednisone, protonix, NaCl @75ml /hr  Labs reviewed: wbc- 13.1(H)   NUTRITION - FOCUSED PHYSICAL EXAM:  Flowsheet Row Most Recent Value  Orbital Region No depletion  Upper Arm Region No depletion  Thoracic and Lumbar Region No depletion  Buccal Region No depletion  Temple Region No depletion  Clavicle Bone Region No depletion  Clavicle and Acromion Bone Region No depletion  Scapular Bone Region No depletion  Dorsal Hand No depletion  Patellar Region No depletion  Anterior Thigh Region No depletion  Posterior Calf Region No depletion  Edema (RD Assessment) None  Hair Reviewed  Eyes Reviewed  Mouth Reviewed  Skin Reviewed  Nails Reviewed   Diet Order:   Diet Order              Diet regular Room service appropriate? Yes; Fluid consistency: Thin  Diet effective now                  EDUCATION NEEDS:   Education needs have been addressed  Skin:  Skin Assessment: Reviewed RN Assessment  Last BM:  9/1  Height:   Ht Readings from Last 1 Encounters:  07/12/21 5' 2"  (1.575 m)    Weight:   Wt Readings from Last 1 Encounters:  07/12/21 69.9 kg    Ideal Body Weight:  50 kg  BMI:  Body mass index is 28.17 kg/m.  Estimated Nutritional Needs:   Kcal:  1700-1900kcal/day  Protein:  85-95g/day  Fluid:  1.3-1.5L/day  Koleen Distance MS, RD, LDN Please refer to [facility] for RD and/or RD on-call/weekend/after hours pager

## 2021-07-13 NOTE — Progress Notes (Signed)
RN reports that patient is experiencing dyspnea, shakiness, and feeling that something is wrong. Neb improved dyspnea, but patient still feels like something is wrong. Vitals stable. Morphine ordered for anxiolytic properties - as ativan is being avoided 2/2 COPD. Trop and EKG ordered for completeness with the feeling of something wrong. I am covering remotely tonight - so not evaluated at bedside. Patient vitals stable.

## 2021-07-14 DIAGNOSIS — J438 Other emphysema: Secondary | ICD-10-CM

## 2021-07-14 LAB — BASIC METABOLIC PANEL
Anion gap: 10 (ref 5–15)
BUN: 14 mg/dL (ref 8–23)
CO2: 22 mmol/L (ref 22–32)
Calcium: 9.7 mg/dL (ref 8.9–10.3)
Chloride: 108 mmol/L (ref 98–111)
Creatinine, Ser: 0.73 mg/dL (ref 0.44–1.00)
GFR, Estimated: >60 mL/min (ref >60)
Glucose, Bld: 146 mg/dL — ABNORMAL HIGH (ref 70–99)
Potassium: 4.7 mmol/L (ref 3.5–5.1)
Sodium: 140 mmol/L (ref 135–145)

## 2021-07-14 LAB — CBC WITH DIFFERENTIAL/PLATELET
Abs Immature Granulocytes: 0.1 10*3/uL — ABNORMAL HIGH (ref 0.00–0.07)
Basophils Absolute: 0.1 10*3/uL (ref 0.0–0.1)
Basophils Relative: 0 %
Eosinophils Absolute: 0 10*3/uL (ref 0.0–0.5)
Eosinophils Relative: 0 %
HCT: 41.8 % (ref 36.0–46.0)
Hemoglobin: 14.1 g/dL (ref 12.0–15.0)
Immature Granulocytes: 1 %
Lymphocytes Relative: 13 %
Lymphs Abs: 2.1 10*3/uL (ref 0.7–4.0)
MCH: 30.7 pg (ref 26.0–34.0)
MCHC: 33.7 g/dL (ref 30.0–36.0)
MCV: 91.1 fL (ref 80.0–100.0)
Monocytes Absolute: 0.1 10*3/uL (ref 0.1–1.0)
Monocytes Relative: 1 %
Neutro Abs: 14.2 10*3/uL — ABNORMAL HIGH (ref 1.7–7.7)
Neutrophils Relative %: 85 %
Platelets: 337 10*3/uL (ref 150–400)
RBC: 4.59 MIL/uL (ref 3.87–5.11)
RDW: 13.2 % (ref 11.5–15.5)
WBC: 16.7 10*3/uL — ABNORMAL HIGH (ref 4.0–10.5)
nRBC: 0 % (ref 0.0–0.2)

## 2021-07-14 LAB — GLUCOSE, CAPILLARY: Glucose-Capillary: 116 mg/dL — ABNORMAL HIGH (ref 70–99)

## 2021-07-14 LAB — MAGNESIUM: Magnesium: 2.5 mg/dL — ABNORMAL HIGH (ref 1.7–2.4)

## 2021-07-14 MED ORDER — AZITHROMYCIN 250 MG PO TABS: ORAL_TABLET | ORAL | 0 refills | Status: AC

## 2021-07-14 MED ORDER — PREDNISONE 10 MG PO TABS
10.0000 mg | ORAL_TABLET | Freq: Every day | ORAL | 0 refills | Status: DC
Start: 2021-07-14 — End: 2021-08-08

## 2021-07-14 MED ORDER — ALBUTEROL SULFATE HFA 108 (90 BASE) MCG/ACT IN AERS: 2.0000 | INHALATION_SPRAY | RESPIRATORY_TRACT | 3 refills | Status: DC | PRN

## 2021-07-14 MED ORDER — HYDROXYZINE HCL 10 MG PO TABS: 10.0000 mg | ORAL_TABLET | Freq: Three times a day (TID) | ORAL | 0 refills | Status: DC | PRN

## 2021-07-14 NOTE — Discharge Summary (Signed)
Discharge Summary  Stephanie Hess [id number] DOB: 03-28-1951  PCP: Ronnell Freshwater, NP  Admit date: 07/12/2021 Discharge date: 07/14/2021  Time spent: 13mns  Recommendations for Outpatient Follow-up:  F/u with PCP within a week  for hospital discharge follow up, repeat cbc/bmp at follow up F/u with pulmonology    Discharge Diagnoses:  Active Hospital Problems   Diagnosis Date Noted   COPD (chronic obstructive pulmonary disease) ([facility] 07/13/[place]   [place]ation [place] [place]    Resolved Hospital Problems  No resolved problems to display.    Discharge Condition: stable  Diet recommendation: heart healthy/carb modified  Filed Weights   07/12/21 1201  Weight: 69.9 kg    History of present illness: ( per admitting provider Dr ARowe Pavy Chief Complaint: Shortness of breath   HPI: RSAYAKA TERRESis a 70y.o. female with a past medical history of COPD with chronic hypoxic respiratory failure on 2 L oxygen at night and as needed during the day, GERD, insomnia, former tobacco use.   This patient presents to the emergency department due to shortness of breath started approximately 6:30 AM at home.  EMS gave her Solu-Medrol 125 mg, DuoNeb treatment and an albuterol treatment.  She denies any fevers or chills.  In the emergency department they attempted to ambulate her and she got tachycardic and tachypneic.  She denies any purulent cough.  Occasional dry cough.  Denies any sore throat.  Denies any fevers or chills.  No abdominal pain, nausea, vomiting, diarrhea.   ED Course: She has been given 2 DuoNeb breathing treatments, magnesium sulfate 2 g IV x1 and Klor-Con 40 mEq p.o. Chest x-ray did not show any infiltrate or consolidation.  CT soft tissue neck was done due to a fullness feeling in her throat which was negative.  COVID-19 PCR was negative.  Potassium was 3.1 which is already been repleted. Leukocytosis of 10.  D-dimer was negative and procalcitonin was  unremarkable.    Hospital Course:  Active Problems:   COPD with acute exacerbation (HCC)   COPD (chronic obstructive pulmonary disease) (HCC)   Copd exacerbation, Acute on chronic hypoxic respiratory failure Was on home o2 night only, but recently needed 24/7 since last week Has significant wheezing initially, wheezing resolved at discharge, she ambulated in room on 3liter oxygen, 02 sats > 94% She is discharged on prednisone taper, zithromax to finish course She reports started having issues after her covid booster vaccine but also reports her daughter had covid at around that time as well  Per chart review her a1c was 6.1 in 2019, she is advised to monitor sugar intake while on prednisone, she is advised to f/u with pcp for possible prediabetes    Chronic cough/dyspnea Wonder if acid reflux could play a role Avoid doxycycline, have to use steroids for wheezing   GERD Continue Protonix    Body mass index is 28.17 kg/m..Marland Kitchen Anxiety Appear had an anxiety attack x1 at night, prn hydroxyzine   Procedures: none  Consultations: none  Discharge Exam: BP 140/89 (BP Location: Left Arm)   Pulse 100   Temp 98.1 F (36.7 C)   Resp 18   Ht '5\' 2"'$  (1.575 m)   Wt 69.9 kg   SpO2 97%   BMI 28.17 kg/m   General: frail, chronically ill appearing, pleasant  Cardiovascular: rrr Respiratory: wheezing has resolved, adequate aeration   Discharge Instructions You were cared for by a hospitalist during your hospital stay. If you have any questions  about your discharge medications or the care you received while you were in the hospital after you are discharged, you can call the unit and asked to speak with the hospitalist on call if the hospitalist that took care of you is not available. Once you are discharged, your primary care physician will handle any further medical issues. Please note that NO REFILLS for any discharge medications will be authorized once you are discharged, as it is  imperative that you return to your primary care physician (or establish a relationship with a primary care physician if you do not have one) for your aftercare needs so that they can reassess your need for medications and monitor your lab values.  Discharge Instructions     Diet - low sodium heart healthy   Complete by: As directed    Low carb diet   Increase activity slowly   Complete by: As directed       Allergies as of 07/14/2021       Reactions   Penicillins Nausea And Vomiting, Other (See Comments)   Has patient had a PCN reaction causing immediate rash, facial/tongue/throat swelling, SOB or lightheadedness with hypotension: No Has patient had a PCN reaction causing severe rash involving mucus membranes or skin necrosis: No Has patient had a PCN reaction that required hospitalization: No Has patient had a PCN reaction occurring within the last 10 years: Yes If all of the above answers are "NO", then may proceed with Cephalosporin use.   Shellfish Allergy Nausea And Vomiting   Sulfa Antibiotics Rash, Other (See Comments)   Other reaction(s): Unknown   Clarithromycin Rash, Nausea Only   rash   Amoxicillin-pot Clavulanate Nausea And Vomiting   Iodinated Diagnostic Agents Other (See Comments)   acute renal insufficiency acute renal insufficiency   Atorvastatin Rash   Macrobid [nitrofurantoin] Rash        Medication List     STOP taking these medications    traZODone 50 MG tablet Commonly known as: DESYREL       TAKE these medications    albuterol 108 (90 Base) MCG/ACT inhaler Commonly known as: VENTOLIN HFA Inhale 2 puffs into the lungs every 4 (four) hours as needed for wheezing or shortness of breath.   aspirin EC 81 MG tablet Take 1 tablet (81 mg total) by mouth daily.   azithromycin 250 MG tablet Commonly known as: Zithromax Z-Pak Take 2 tablets (500 mg) on  Day 1,  followed by 1 tablet (250 mg) once daily on Days 2 through 5.   benzonatate 100 MG  capsule Commonly known as: TESSALON Take 2 capsules (200 mg total) by mouth every 8 (eight) hours.   calcium-vitamin D 500-200 MG-UNIT tablet Commonly known as: OSCAL WITH D Take 1 tablet by mouth.   hydrOXYzine 10 MG tablet Commonly known as: ATARAX/VISTARIL Take 1 tablet (10 mg total) by mouth 3 (three) times daily as needed for anxiety.   omeprazole 20 MG capsule Commonly known as: PRILOSEC Take 20 mg by mouth daily.   OXYGEN Inhale into the lungs. 2 litre at night   predniSONE 10 MG tablet Commonly known as: DELTASONE Take 1 tablet (10 mg total) by mouth daily. Label  & dispense according to the schedule below. 6 Pills PO on day one then, 5 Pills PO on day two, 4 Pills PO on day three, 3Pills PO on day four, 2 Pills PO on day five, 1 Pills PO on day six,  then STOP.  Total of 21 tabs  Allergies  Allergen Reactions   Penicillins Nausea And Vomiting and Other (See Comments)    Has patient had a PCN reaction causing immediate rash, facial/tongue/throat swelling, SOB or lightheadedness with hypotension: No Has patient had a PCN reaction causing severe rash involving mucus membranes or skin necrosis: No Has patient had a PCN reaction that required hospitalization: No Has patient had a PCN reaction occurring within the last 10 years: Yes If all of the above answers are "NO", then may proceed with Cephalosporin use.    Shellfish Allergy Nausea And Vomiting   Sulfa Antibiotics Rash and Other (See Comments)    Other reaction(s): Unknown   Clarithromycin Rash and Nausea Only    rash   Amoxicillin-Pot Clavulanate Nausea And Vomiting   Iodinated Diagnostic Agents Other (See Comments)    acute renal insufficiency acute renal insufficiency   Atorvastatin Rash   Macrobid [Nitrofurantoin] Rash    Follow-up Information     Ronnell Freshwater, NP Follow up in 1 week(s).   Specialty: Family Medicine Why: hospital discharge follow up, repeat basic lab work cbc/bmp at follow  up pcp to monitor your blood sugar Contact information: [place] [address] [phone]         Patrina Levering, MD Follow up in 2 week(s).   Specialty: Internal Medicine Why: for copd management Contact information: [address] [place] [address] [phone]                  The results of significant diagnostics from this hospitalization (including imaging, microbiology, ancillary and laboratory) are listed below for reference.    Significant Diagnostic Studies: CT SOFT TISSUE NECK WO CONTRAST  Result Date: 07/12/2021 CLINICAL DATA:  Neck abscess, deep tissue. Shortness of breath. Difficulty swallowing and globus sensation. EXAM: CT NECK WITHOUT CONTRAST TECHNIQUE: Multidetector CT imaging of the neck was performed following the standard protocol without intravenous contrast. COMPARISON:  None. FINDINGS: Pharynx and larynx: Mild motion artifact. No evidence of mass or swelling. No fluid collection or significant inflammatory changes in the parapharyngeal or retropharyngeal spaces. Salivary glands: No inflammation, mass, or stone. Thyroid: Unremarkable. Lymph nodes: No enlarged or suspicious lymph nodes in the neck. Vascular: Mild calcific atherosclerosis at the carotid bifurcations. Limited intracranial: Unremarkable. Visualized orbits: Unremarkable. Mastoids and visualized paranasal sinuses: Scattered mild mucosal thickening in the ethmoid air cells bilaterally. Clear mastoid air cells. Skeleton: Mild disc and moderate facet degeneration in the cervical and upper thoracic spine. Upper chest: Emphysema. Other: None. IMPRESSION: 1. No acute abnormality identified in the neck. 2. Emphysema (ICD10-J43.9). Electronically Signed   By: Logan Bores M.D.   On: 07/12/2021 17:17   DG Chest Port 1 View  Result Date: 07/12/2021 CLINICAL DATA:  Shortness of breath, COPD EXAM: PORTABLE CHEST 1 VIEW COMPARISON:  04/05/2021 FINDINGS:  The heart size and mediastinal contours are within normal limits. Band-like opacities in the left lung base, likely atelectasis. Chronically coarsened interstitial markings bilaterally. No pleural effusion or pneumothorax. Degenerative changes of the shoulders, right worse than left. IMPRESSION: Band-like opacities in the left lung base, likely atelectasis. Electronically Signed   By: Davina Poke D.O.   On: 07/12/2021 13:32    Microbiology: Recent Results (from the past 240 hour(s))  Resp Panel by RT-PCR (Flu A&B, Covid) Nasopharyngeal Swab     Status: None   Collection Time: 07/12/21  3:40 PM   Specimen: Nasopharyngeal Swab; Nasopharyngeal(NP) swabs in vial transport medium  Result Value Ref Range Status   SARS Coronavirus 2 by RT PCR NEGATIVE NEGATIVE Final    Comment: (NOTE) SARS-CoV-2 target nucleic acids are NOT DETECTED.  The SARS-CoV-2 RNA is generally detectable in upper respiratory specimens during the acute phase of infection. The lowest concentration of SARS-CoV-2 viral copies this assay can detect is 138 copies/mL. A negative result does not preclude SARS-Cov-2 infection and should not be used as the sole basis for treatment or other patient management decisions. A negative result may occur with  improper specimen collection/handling, submission of specimen other than nasopharyngeal swab, presence of viral mutation(s) within the areas targeted by this assay, and inadequate number of viral copies(<138 copies/mL). A negative result must be combined with clinical observations, patient history, and epidemiological information. The expected result is Negative.  Fact Sheet for Patients:  EntrepreneurPulse.com.au  Fact Sheet for Healthcare Providers:  IncredibleEmployment.be  This test is no t yet approved or cleared by the [place] FDA and  has been authorized for detection and/or diagnosis of SARS-CoV-2 by FDA under an Emergency Use  Authorization (EUA). This EUA will remain  in effect (meaning this test can be used) for the duration of the COVID-19 declaration under Section 564(b)(1) of the Act, 21 U.S.C.section 360bbb-3(b)(1), unless the authorization is terminated  or revoked sooner.       Influenza A by PCR NEGATIVE NEGATIVE Final   Influenza B by PCR NEGATIVE NEGATIVE Final    Comment: (NOTE) The Xpert Xpress SARS-CoV-2/FLU/RSV plus assay is intended as an aid in the diagnosis of influenza from Nasopharyngeal swab specimens and should not be used as a sole basis for treatment. Nasal washings and aspirates are unacceptable for Xpert Xpress SARS-CoV-2/FLU/RSV testing.  Fact Sheet for Patients: EntrepreneurPulse.com.au  Fact Sheet for Healthcare Providers: IncredibleEmployment.be  This test is not yet approved or cleared by the [place] FDA and has been authorized for detection and/or diagnosis of SARS-CoV-2 by FDA under an Emergency Use Authorization (EUA). This EUA will remain in effect (meaning this test can be used) for the duration of the COVID-19 declaration under Section 564(b)(1) of the Act, 21 U.S.C. section 360bbb-3(b)(1), unless the authorization is terminated or revoked.  Performed at [facility], [place], [place] [address]   Respiratory (~20 pathogens) panel by PCR     Status: None   Collection Time: 07/13/21  4:00 PM   Specimen: Nasopharyngeal Swab; Respiratory  Result Value Ref Range Status   Adenovirus NOT DETECTED NOT DETECTED Final   Coronavirus 229E NOT DETECTED NOT DETECTED Final    Comment: (NOTE) The Coronavirus on the Respiratory Panel, DOES NOT test for the novel  Coronavirus (2019 nCoV)    Coronavirus HKU1 NOT DETECTED NOT DETECTED Final   Coronavirus NL63 NOT DETECTED NOT DETECTED Final   Coronavirus OC43 NOT DETECTED NOT DETECTED Final   Metapneumovirus NOT DETECTED NOT DETECTED Final   Rhinovirus /  Enterovirus NOT DETECTED NOT DETECTED Final   Influenza A NOT DETECTED NOT DETECTED Final   Influenza B NOT DETECTED NOT DETECTED Final   Parainfluenza Virus 1 NOT DETECTED NOT DETECTED Final   Parainfluenza Virus 2 NOT DETECTED NOT DETECTED Final   Parainfluenza Virus 3 NOT DETECTED NOT DETECTED Final   Parainfluenza Virus 4 NOT DETECTED NOT DETECTED Final   Respiratory Syncytial Virus NOT DETECTED NOT DETECTED Final   Bordetella pertussis NOT DETECTED NOT DETECTED Final   Bordetella Parapertussis NOT DETECTED NOT DETECTED Final   Chlamydophila pneumoniae NOT DETECTED NOT  DETECTED Final   Mycoplasma pneumoniae NOT DETECTED NOT DETECTED Final    Comment: Performed at [facility], [place] [address] [place], [place] [address]     Labs: Basic Metabolic Panel: Recent Labs  Lab 07/12/21 1205 07/13/21 0200 07/14/21 0515  NA 139 138 140  K 3.1* 4.2 4.7  CL 105 108 108  CO2 '24 22 22  '$ GLUCOSE 101* 124* 146*  BUN 6* 10 14  CREATININE 0.60 0.55 0.73  CALCIUM 9.7 9.8 9.7  MG  --   --  2.5*   Liver Function Tests: Recent Labs  Lab 07/12/21 1205  AST 31  ALT 27  ALKPHOS 71  BILITOT 0.6  PROT 8.1  ALBUMIN 4.4   No results for input(s): LIPASE, AMYLASE in the last 168 hours. No results for input(s): AMMONIA in the last 168 hours. CBC: Recent Labs  Lab 07/12/21 1205 07/13/21 0200 07/14/21 0515  WBC 10.8* 13.1* 16.7*  NEUTROABS  --   --  14.2*  HGB 13.7 13.4 14.1  HCT 39.7 38.7 41.8  MCV 91.9 89.6 91.1  PLT 289 311 337   Cardiac Enzymes: No results for input(s): CKTOTAL, CKMB, CKMBINDEX, TROPONINI in the last 168 hours. BNP: BNP (last 3 results) No results for input(s): BNP in the last 8760 hours.  ProBNP (last 3 results) No results for input(s): PROBNP in the last 8760 hours.  CBG: Recent Labs  Lab 07/13/21 0730 07/14/21 0901  GLUCAP 103* 116*       Signed:  Florencia Reasons MD, PhD, FACP  [facility] 07/14/2021, 1:59 PM

## 2021-07-14 NOTE — Progress Notes (Signed)
Mobility Specialist - Progress Note   07/14/21 1230  Mobility  Activity Ambulated in room  Level of Assistance Modified independent, requires aide device or extra time  Assistive Device None;Other (Comment) (IV Pole)  Distance Ambulated (ft) 75 ft  Mobility Ambulated independently in room  Mobility Response Tolerated well  Mobility performed by Mobility specialist  $Mobility charge 1 Mobility    Pre-mobility: 105 HR, 97% SpO2 During mobility: 118 HR, 94% SpO2 Post-mobility: 97 HR, 94% SpO2   Pt sitting in recliner on arrival, utilizing 3L. Ambulated in room pushing IV pole. Second bout of ambulation trial on RA, O2 maintained > 93% throughout session on both RA and 3L. Pt ambulated to sink to brush teeth and engage in LB dressing change with supervision. Pt reports feeling at/near baseline for activity. Pt left in bed with needs in reach. Bed malfunction, staff notified.    Kathee Delton Mobility Specialist 07/14/21, 12:37 PM

## 2021-08-08 ENCOUNTER — Ambulatory Visit (INDEPENDENT_AMBULATORY_CARE_PROVIDER_SITE_OTHER): Payer: Medicare Other | Admitting: Nurse Practitioner

## 2021-08-08 ENCOUNTER — Encounter: Payer: Self-pay | Admitting: Nurse Practitioner

## 2021-08-08 ENCOUNTER — Other Ambulatory Visit: Payer: Self-pay

## 2021-08-08 VITALS — BP 103/64 | HR 99 | Temp 98.0°F | Ht 62.0 in | Wt 153.8 lb

## 2021-08-08 DIAGNOSIS — R079 Chest pain, unspecified: Secondary | ICD-10-CM | POA: Diagnosis not present

## 2021-08-08 DIAGNOSIS — R051 Acute cough: Secondary | ICD-10-CM | POA: Diagnosis not present

## 2021-08-08 DIAGNOSIS — J441 Chronic obstructive pulmonary disease with (acute) exacerbation: Secondary | ICD-10-CM

## 2021-08-08 DIAGNOSIS — R062 Wheezing: Secondary | ICD-10-CM | POA: Insufficient documentation

## 2021-08-08 DIAGNOSIS — R059 Cough, unspecified: Secondary | ICD-10-CM

## 2021-08-08 DIAGNOSIS — F411 Generalized anxiety disorder: Secondary | ICD-10-CM

## 2021-08-08 MED ORDER — LEVOFLOXACIN 500 MG PO TABS: 500.0000 mg | ORAL_TABLET | Freq: Every day | ORAL | 0 refills | Status: DC

## 2021-08-08 MED ORDER — BUSPIRONE HCL 10 MG PO TABS: ORAL_TABLET | ORAL | 2 refills | Status: DC

## 2021-08-08 MED ORDER — LEVALBUTEROL TARTRATE 45 MCG/ACT IN AERO: 1.0000 | INHALATION_SPRAY | Freq: Four times a day (QID) | RESPIRATORY_TRACT | 3 refills | Status: DC | PRN

## 2021-08-08 MED ORDER — IPRATROPIUM BROMIDE 0.02 % IN SOLN: 0.5000 mg | Freq: Four times a day (QID) | RESPIRATORY_TRACT | 3 refills | Status: AC

## 2021-08-08 MED ORDER — SPIRIVA RESPIMAT 2.5 MCG/ACT IN AERS: 2.0000 | INHALATION_SPRAY | Freq: Every day | RESPIRATORY_TRACT | 3 refills | Status: DC

## 2021-08-08 NOTE — Progress Notes (Signed)
Established Patient Office Visit  Subjective:  Patient ID: Stephanie Hess, female    DOB: 1951/08/29  Age: 70 y.o. MRN: [id number]  CC:  Chief Complaint  Patient presents with   Hospitalization Follow-up    HPI Stephanie Hess presents for ER and hospital follow up. She has been having increased shortness of breath.  She is also feeling tightness in the chest without chest pain.  Taking albuterol nebulizer treatments was causing her to feel anxious and her heart to race. She was started on nebulized xopenex which seems to work better without the negative side effects. Consider HFA inhaler depending on expense. She last went to ER on 07/15/2021. An ECG was done at the time of visit. She was found to have sinus tachycardia. It was otherwise a normal ECG. Compared to prior ECGs, there were no significant changes noted. A chest x-ray was done 07/12/2021. There were band-like opacities in the left lung base. Interpretation was likely atelectasis. She was sent home with two days of prednisone, albuterol nebulizer, and spiriva respimat.  Today, she is having left arm pain. She describes the pain as sharp pain running from the shoulder down the arm. Denies chest pain. She states that heart feels like it's racing. She feels dizzy and light heeded. She states that symptoms have been intermittent, and this is worse today. She states that she did use albuterol nebulizer earlier today. Also used inhaler to try to catch her breath. She feels like this may have made the symptoms worse.   Past Medical History:  Diagnosis Date   Cancer ([place])    COPD (chronic obstructive pulmonary disease) ([place])    Diverticula of colon    GERD (gastroesophageal reflux disease)    Hyperlipidemia    Renal insufficiency    2009 or 2010    Past Surgical History:  Procedure Laterality Date   APPENDECTOMY     CESAREAN SECTION     ECTOPIC PREGNANCY SURGERY      Family History  Problem Relation Age of Onset   Breast cancer  Paternal Aunt    Heart attack Mother     Social History   Socioeconomic History   Marital status: Divorced    Spouse name: Not on file   Number of children: Not on file   Years of education: Not on file   Highest education level: Not on file  Occupational History   Not on file  Tobacco Use   Smoking status: Former    Packs/day: 0.50    Years: 50.00    Pack years: 25.00    Types: Cigarettes   Smokeless tobacco: Never  Vaping Use   Vaping Use: Never used  Substance and Sexual Activity   Alcohol use: Yes    Comment: social   Drug use: No   Sexual activity: Not Currently  Other Topics Concern   Not on file  Social History Narrative   Not on file   Social Determinants of Health   Financial Resource Strain: Not on file  Food Insecurity: Not on file  Transportation Needs: Not on file  Physical Activity: Not on file  Stress: Not on file  Social Connections: Not on file  Intimate Partner Violence: Not on file    Outpatient Medications Prior to Visit  Medication Sig Dispense Refill   aspirin EC 81 MG tablet Take 1 tablet (81 mg total) by mouth daily. 90 tablet 3   calcium-vitamin D (OSCAL WITH D) 500-200 MG-UNIT tablet Take 1 tablet  by mouth.     hydrOXYzine (ATARAX/VISTARIL) 10 MG tablet Take 1 tablet (10 mg total) by mouth 3 (three) times daily as needed for anxiety. 30 tablet 0   omeprazole (PRILOSEC) 20 MG capsule Take 20 mg by mouth daily.     OXYGEN Inhale into the lungs. 2 litre at night     albuterol (VENTOLIN HFA) 108 (90 Base) MCG/ACT inhaler Inhale 2 puffs into the lungs every 4 (four) hours as needed for wheezing or shortness of breath. 18 each 3   benzonatate (TESSALON) 100 MG capsule Take 2 capsules (200 mg total) by mouth every 8 (eight) hours. 30 capsule 1   predniSONE (DELTASONE) 10 MG tablet Take 1 tablet (10 mg total) by mouth daily. Label  & dispense according to the schedule below. 6 Pills PO on day one then, 5 Pills PO on day two, 4 Pills PO on day  three, 3Pills PO on day four, 2 Pills PO on day five, 1 Pills PO on day six,  then STOP.  Total of 21 tabs 30 tablet 0   No facility-administered medications prior to visit.    Allergies  Allergen Reactions   Penicillins Nausea And Vomiting and Other (See Comments)    Has patient had a PCN reaction causing immediate rash, facial/tongue/throat swelling, SOB or lightheadedness with hypotension: No Has patient had a PCN reaction causing severe rash involving mucus membranes or skin necrosis: No Has patient had a PCN reaction that required hospitalization: No Has patient had a PCN reaction occurring within the last 10 years: Yes If all of the above answers are "NO", then may proceed with Cephalosporin use.    Shellfish Allergy Nausea And Vomiting   Sulfa Antibiotics Rash and Other (See Comments)    Other reaction(s): Unknown   Clarithromycin Rash and Nausea Only    rash   Amoxicillin-Pot Clavulanate Nausea And Vomiting   Iodinated Diagnostic Agents Other (See Comments)    acute renal insufficiency acute renal insufficiency   Atorvastatin Rash   Macrobid [Nitrofurantoin] Rash    ROS Review of Systems  Constitutional:  Positive for fatigue. Negative for activity change, appetite change, chills and fever.  HENT:  Negative for congestion, postnasal drip, rhinorrhea, sinus pressure, sinus pain, sneezing and sore throat.   Eyes: Negative.   Respiratory:  Positive for cough, chest tightness and shortness of breath. Negative for wheezing.   Cardiovascular:  Positive for chest pain. Negative for palpitations.  Gastrointestinal:  Negative for abdominal pain, constipation, diarrhea, nausea and vomiting.  Endocrine: Negative for cold intolerance, heat intolerance, polydipsia and polyuria.  Genitourinary:  Negative for dyspareunia, dysuria, flank pain, frequency and urgency.  Musculoskeletal:  Positive for arthralgias and myalgias. Negative for back pain.  Skin:  Negative for rash.   Allergic/Immunologic: Positive for environmental allergies.  Neurological:  Negative for dizziness, weakness and headaches.  Hematological:  Negative for adenopathy.  Psychiatric/Behavioral:  Positive for dysphoric mood and sleep disturbance. The patient is nervous/anxious.      Objective:    Physical Exam Vitals and nursing note reviewed.  Constitutional:      General: She is in acute distress.     Appearance: Normal appearance. She is well-developed.  HENT:     Head: Normocephalic and atraumatic.     Nose: Nose normal.     Mouth/Throat:     Mouth: Mucous membranes are moist.     Pharynx: Oropharynx is clear.  Eyes:     Extraocular Movements: Extraocular movements intact.  Conjunctiva/sclera: Conjunctivae normal.     Pupils: Pupils are equal, round, and reactive to light.  Neck:     Vascular: No carotid bruit.  Cardiovascular:     Rate and Rhythm: Normal rate and regular rhythm.     Pulses: Normal pulses.     Heart sounds: Normal heart sounds.     Comments: EKG showing prolonged QT but otherwise normal sinus rhythm.  Consider borderline EKG.  She is not currently taking medication known to prolong QT. Pulmonary:     Effort: Pulmonary effort is normal.     Breath sounds: Wheezing and rhonchi present.  Chest:     Chest wall: Tenderness present.  Abdominal:     Palpations: Abdomen is soft.  Musculoskeletal:        General: Normal range of motion.     Cervical back: Normal range of motion and neck supple.  Lymphadenopathy:     Cervical: No cervical adenopathy.  Skin:    General: Skin is warm and dry.     Capillary Refill: Capillary refill takes less than 2 seconds.  Neurological:     General: No focal deficit present.     Mental Status: She is alert and oriented to person, place, and time.  Psychiatric:        Mood and Affect: Affect normal. Mood is anxious.        Speech: Speech normal.        Behavior: Behavior normal. Behavior is cooperative.        Thought  Content: Thought content normal.        Cognition and Memory: Cognition and memory normal.        Judgment: Judgment normal.   Today's Vitals   08/08/21 1314  BP: 103/64  Pulse: 99  Temp: 98 F (36.7 C)  SpO2: 95%  Weight: 153 lb 12.8 oz (69.8 kg)  Height: $Remove'5\' 2"'DvxhPIX$  (1.575 m)   Body mass index is 28.13 kg/m.   Wt Readings from Last 3 Encounters:  08/08/21 153 lb 12.8 oz (69.8 kg)  07/12/21 154 lb (69.9 kg)  04/17/21 153 lb (69.4 kg)     Health Maintenance Due  Topic Date Due   Zoster Vaccines- Shingrix (1 of 2) Never done   DEXA SCAN  Never done   INFLUENZA VACCINE  06/11/2021   COVID-19 Vaccine (3 - Booster for Moderna series) 08/19/2021    There are no preventive care reminders to display for this patient.  Lab Results  Component Value Date   TSH 1.510 01/19/2021   Lab Results  Component Value Date   WBC 16.7 (H) 07/14/2021   HGB 14.1 07/14/2021   HCT 41.8 07/14/2021   MCV 91.1 07/14/2021   PLT 337 07/14/2021   Lab Results  Component Value Date   NA 140 07/14/2021   K 4.7 07/14/2021   CO2 22 07/14/2021   GLUCOSE 146 (H) 07/14/2021   BUN 14 07/14/2021   CREATININE 0.73 07/14/2021   BILITOT 0.6 07/12/2021   ALKPHOS 71 07/12/2021   AST 31 07/12/2021   ALT 27 07/12/2021   PROT 8.1 07/12/2021   ALBUMIN 4.4 07/12/2021   CALCIUM 9.7 07/14/2021   ANIONGAP 10 07/14/2021   EGFR 88 01/19/2021   Lab Results  Component Value Date   CHOL 278 (H) 01/19/2021   Lab Results  Component Value Date   HDL 41 01/19/2021   Lab Results  Component Value Date   LDLCALC 180 (H) 01/19/2021   Lab Results  Component Value  Date   TRIG 294 (H) 01/19/2021   Lab Results  Component Value Date   CHOLHDL 8.1 (H) 08/12/2018   Lab Results  Component Value Date   HGBA1C 6.1 (H) 08/12/2018      Assessment & Plan:  1. Chest pain, unspecified type EKG was performed during today's visit.  Showing prolonged QT but otherwise normal sinus rhythm.  Not currently on  medications known to prolong QT.  Likely pulmonary in nature.  Will monitor. - EKG 12-Lead  2. Chronic obstructive pulmonary disease with (acute) exacerbation (HCC) Start Levaquin 500 mg tablets daily for next 7 days.  Add Spiriva Respimat 2.5 mcg per actuation.  She should inhaled 2 puffs into the lungs daily.  Add Xopenex HFA which may be used up to 4 times daily as needed for acute wheezing and cough.  Also add ipratropium nebulizer solution.  This may be used up to 4 times daily for severe shortness of breath, wheezing, or cough.  She is scheduled to follow-up with pulmonologist tomorrow. - Tiotropium Bromide Monohydrate (SPIRIVA RESPIMAT) 2.5 MCG/ACT AERS; Inhale 2 puffs into the lungs daily.  Dispense: 1 each; Refill: 3 - levalbuterol (XOPENEX HFA) 45 MCG/ACT inhaler; Inhale 1 puff into the lungs every 6 (six) hours as needed for wheezing.  Dispense: 1 each; Refill: 3 - ipratropium (ATROVENT) 0.02 % nebulizer solution; Take 2.5 mLs (0.5 mg total) by nebulization 4 (four) times daily.  Dispense: 75 mL; Refill: 3 - levofloxacin (LEVAQUIN) 500 MG tablet; Take 1 tablet (500 mg total) by mouth daily.  Dispense: 7 tablet; Refill: 0  3. Acute cough This is likely due to acute exacerbation of COPD. Start Levaquin 500 mg tablets daily for next 7 days.  Add Spiriva Respimat 2.5 mcg per actuation.  She should inhaled 2 puffs into the lungs daily.  Add Xopenex HFA which may be used up to 4 times daily as needed for acute wheezing and cough.  Also add ipratropium nebulizer solution.  This may be used up to 4 times daily for severe shortness of breath, wheezing, or cough.  She is scheduled to follow-up with pulmonologist tomorrow.  4. Wheezing This is likely due to acute exacerbation of COPD. Start Levaquin 500 mg tablets daily for next 7 days.  Add Spiriva Respimat 2.5 mcg per actuation.  She should inhaled 2 puffs into the lungs daily.  Add Xopenex HFA which may be used up to 4 times daily as needed for  acute wheezing and cough.  Also add ipratropium nebulizer solution.  This may be used up to 4 times daily for severe shortness of breath, wheezing, or cough.  She is scheduled to follow-up with pulmonologist tomorrow. - ipratropium (ATROVENT) 0.02 % nebulizer solution; Take 2.5 mLs (0.5 mg total) by nebulization 4 (four) times daily.  Dispense: 75 mL; Refill: 3 - levofloxacin (LEVAQUIN) 500 MG tablet; Take 1 tablet (500 mg total) by mouth daily.  Dispense: 7 tablet; Refill: 0  5. Generalized anxiety disorder Add buspirone 10 mg tablets.  She may take 1/2 to 1 tablet up to 3 times daily as needed for acute anxiety. - busPIRone (BUSPAR) 10 MG tablet; Take 1/2 to 1 tablet up to three times daily as needed for anxiety  Dispense: 90 tablet; Refill: 2   Problem List Items Addressed This Visit       Respiratory   COPD (chronic obstructive pulmonary disease) ([place])   Relevant Medications   Tiotropium Bromide Monohydrate (SPIRIVA RESPIMAT) 2.5 MCG/ACT AERS   levalbuterol Penne Lash  HFA) 45 MCG/ACT inhaler   ipratropium (ATROVENT) 0.02 % nebulizer solution   levofloxacin (LEVAQUIN) 500 MG tablet     Other   Cough   Wheezing   Relevant Medications   ipratropium (ATROVENT) 0.02 % nebulizer solution   levofloxacin (LEVAQUIN) 500 MG tablet   Chest pain - Primary   Relevant Orders   EKG 12-Lead (Completed)   Generalized anxiety disorder   Relevant Medications   busPIRone (BUSPAR) 10 MG tablet    Meds ordered this encounter  Medications   Tiotropium Bromide Monohydrate (SPIRIVA RESPIMAT) 2.5 MCG/ACT AERS    Sig: Inhale 2 puffs into the lungs daily.    Dispense:  1 each    Refill:  3    Order Specific Question:   Supervising Provider    Answer:   Beatrice Lecher D [[id number]]   levalbuterol (XOPENEX HFA) 45 MCG/ACT inhaler    Sig: Inhale 1 puff into the lungs every 6 (six) hours as needed for wheezing.    Dispense:  1 each    Refill:  3    Order Specific Question:   Supervising Provider     Answer:   Beatrice Lecher D [[id number]]   ipratropium (ATROVENT) 0.02 % nebulizer solution    Sig: Take 2.5 mLs (0.5 mg total) by nebulization 4 (four) times daily.    Dispense:  75 mL    Refill:  3    Order Specific Question:   Supervising Provider    Answer:   Beatrice Lecher D [[id number]]   levofloxacin (LEVAQUIN) 500 MG tablet    Sig: Take 1 tablet (500 mg total) by mouth daily.    Dispense:  7 tablet    Refill:  0    Order Specific Question:   Supervising Provider    Answer:   Beatrice Lecher D [[id number]]   busPIRone (BUSPAR) 10 MG tablet    Sig: Take 1/2 to 1 tablet up to three times daily as needed for anxiety    Dispense:  90 tablet    Refill:  2    Order Specific Question:   Supervising Provider    Answer:   Beatrice Lecher D [[id number]]   This note was dictated using Dragon Voice Recognition Software. Rapid proofreading was performed to expedite the delivery of the information. Despite proofreading, phonetic errors will occur which are common with this voice recognition software. Please take this into consideration. If there are any concerns, please contact our office.    Follow-up: Return in about 3 months (around 11/07/2021) for medicare wellness.    Ronnell Freshwater, NP

## 2021-08-14 DIAGNOSIS — Z87891 Personal history of nicotine dependence: Secondary | ICD-10-CM | POA: Diagnosis not present

## 2021-08-14 DIAGNOSIS — R062 Wheezing: Secondary | ICD-10-CM | POA: Diagnosis not present

## 2021-08-14 DIAGNOSIS — Z20822 Contact with and (suspected) exposure to covid-19: Secondary | ICD-10-CM | POA: Diagnosis not present

## 2021-08-14 DIAGNOSIS — E041 Nontoxic single thyroid nodule: Secondary | ICD-10-CM | POA: Diagnosis not present

## 2021-08-14 DIAGNOSIS — J4520 Mild intermittent asthma, uncomplicated: Secondary | ICD-10-CM | POA: Diagnosis not present

## 2021-08-14 DIAGNOSIS — J439 Emphysema, unspecified: Secondary | ICD-10-CM | POA: Diagnosis not present

## 2021-08-14 DIAGNOSIS — J9601 Acute respiratory failure with hypoxia: Secondary | ICD-10-CM | POA: Diagnosis not present

## 2021-08-14 DIAGNOSIS — J441 Chronic obstructive pulmonary disease with (acute) exacerbation: Secondary | ICD-10-CM | POA: Diagnosis not present

## 2021-08-14 DIAGNOSIS — E042 Nontoxic multinodular goiter: Secondary | ICD-10-CM | POA: Diagnosis not present

## 2021-08-14 DIAGNOSIS — R0602 Shortness of breath: Secondary | ICD-10-CM | POA: Diagnosis not present

## 2021-08-14 DIAGNOSIS — J3800 Paralysis of vocal cords and larynx, unspecified: Secondary | ICD-10-CM | POA: Diagnosis not present

## 2021-08-14 DIAGNOSIS — J398 Other specified diseases of upper respiratory tract: Secondary | ICD-10-CM | POA: Diagnosis not present

## 2021-08-14 DIAGNOSIS — R000 Tachycardia, unspecified: Secondary | ICD-10-CM | POA: Diagnosis not present

## 2021-08-14 DIAGNOSIS — Z79899 Other long term (current) drug therapy: Secondary | ICD-10-CM | POA: Diagnosis not present

## 2021-08-14 DIAGNOSIS — K219 Gastro-esophageal reflux disease without esophagitis: Secondary | ICD-10-CM | POA: Diagnosis not present

## 2021-08-14 DIAGNOSIS — R0902 Hypoxemia: Secondary | ICD-10-CM | POA: Diagnosis not present

## 2021-08-14 DIAGNOSIS — J45909 Unspecified asthma, uncomplicated: Secondary | ICD-10-CM | POA: Diagnosis not present

## 2021-08-14 DIAGNOSIS — G4700 Insomnia, unspecified: Secondary | ICD-10-CM | POA: Diagnosis not present

## 2021-08-14 DIAGNOSIS — C320 Malignant neoplasm of glottis: Secondary | ICD-10-CM | POA: Diagnosis not present

## 2021-08-14 DIAGNOSIS — R918 Other nonspecific abnormal finding of lung field: Secondary | ICD-10-CM | POA: Diagnosis not present

## 2021-08-14 DIAGNOSIS — J449 Chronic obstructive pulmonary disease, unspecified: Secondary | ICD-10-CM | POA: Diagnosis not present

## 2021-08-14 DIAGNOSIS — J984 Other disorders of lung: Secondary | ICD-10-CM | POA: Diagnosis not present

## 2021-08-14 DIAGNOSIS — Z7982 Long term (current) use of aspirin: Secondary | ICD-10-CM | POA: Diagnosis not present

## 2021-08-14 DIAGNOSIS — R7402 Elevation of levels of lactic acid dehydrogenase (LDH): Secondary | ICD-10-CM | POA: Diagnosis not present

## 2021-08-14 DIAGNOSIS — R059 Cough, unspecified: Secondary | ICD-10-CM | POA: Diagnosis not present

## 2021-08-14 DIAGNOSIS — Z9049 Acquired absence of other specified parts of digestive tract: Secondary | ICD-10-CM | POA: Diagnosis not present

## 2021-08-14 DIAGNOSIS — I2510 Atherosclerotic heart disease of native coronary artery without angina pectoris: Secondary | ICD-10-CM | POA: Diagnosis not present

## 2021-08-14 DIAGNOSIS — J9809 Other diseases of bronchus, not elsewhere classified: Secondary | ICD-10-CM | POA: Diagnosis not present

## 2021-08-14 DIAGNOSIS — Z9981 Dependence on supplemental oxygen: Secondary | ICD-10-CM | POA: Diagnosis not present

## 2021-08-14 DIAGNOSIS — K449 Diaphragmatic hernia without obstruction or gangrene: Secondary | ICD-10-CM | POA: Diagnosis not present

## 2021-08-14 DIAGNOSIS — E872 Acidosis, unspecified: Secondary | ICD-10-CM | POA: Diagnosis not present

## 2021-08-15 DIAGNOSIS — R7402 Elevation of levels of lactic acid dehydrogenase (LDH): Secondary | ICD-10-CM | POA: Diagnosis not present

## 2021-08-15 DIAGNOSIS — R000 Tachycardia, unspecified: Secondary | ICD-10-CM | POA: Diagnosis not present

## 2021-08-15 DIAGNOSIS — R918 Other nonspecific abnormal finding of lung field: Secondary | ICD-10-CM | POA: Diagnosis not present

## 2021-08-15 DIAGNOSIS — R0902 Hypoxemia: Secondary | ICD-10-CM | POA: Diagnosis not present

## 2021-08-15 DIAGNOSIS — Z87891 Personal history of nicotine dependence: Secondary | ICD-10-CM | POA: Diagnosis not present

## 2021-08-15 DIAGNOSIS — J441 Chronic obstructive pulmonary disease with (acute) exacerbation: Secondary | ICD-10-CM | POA: Diagnosis not present

## 2021-08-15 DIAGNOSIS — J984 Other disorders of lung: Secondary | ICD-10-CM | POA: Diagnosis not present

## 2021-08-16 DIAGNOSIS — R000 Tachycardia, unspecified: Secondary | ICD-10-CM | POA: Diagnosis not present

## 2021-08-16 DIAGNOSIS — G4700 Insomnia, unspecified: Secondary | ICD-10-CM | POA: Diagnosis not present

## 2021-08-16 DIAGNOSIS — J3800 Paralysis of vocal cords and larynx, unspecified: Secondary | ICD-10-CM | POA: Diagnosis not present

## 2021-08-16 DIAGNOSIS — E041 Nontoxic single thyroid nodule: Secondary | ICD-10-CM | POA: Diagnosis not present

## 2021-08-16 DIAGNOSIS — C320 Malignant neoplasm of glottis: Secondary | ICD-10-CM | POA: Diagnosis not present

## 2021-08-16 DIAGNOSIS — J45909 Unspecified asthma, uncomplicated: Secondary | ICD-10-CM | POA: Diagnosis not present

## 2021-08-16 DIAGNOSIS — Z87891 Personal history of nicotine dependence: Secondary | ICD-10-CM | POA: Diagnosis not present

## 2021-08-16 DIAGNOSIS — R918 Other nonspecific abnormal finding of lung field: Secondary | ICD-10-CM | POA: Diagnosis not present

## 2021-08-16 DIAGNOSIS — J441 Chronic obstructive pulmonary disease with (acute) exacerbation: Secondary | ICD-10-CM | POA: Diagnosis not present

## 2021-08-17 DIAGNOSIS — Z87891 Personal history of nicotine dependence: Secondary | ICD-10-CM | POA: Diagnosis not present

## 2021-08-17 DIAGNOSIS — R000 Tachycardia, unspecified: Secondary | ICD-10-CM | POA: Diagnosis not present

## 2021-08-17 DIAGNOSIS — E872 Acidosis, unspecified: Secondary | ICD-10-CM | POA: Diagnosis not present

## 2021-08-17 DIAGNOSIS — E042 Nontoxic multinodular goiter: Secondary | ICD-10-CM | POA: Diagnosis not present

## 2021-08-17 DIAGNOSIS — G4700 Insomnia, unspecified: Secondary | ICD-10-CM | POA: Diagnosis not present

## 2021-08-17 DIAGNOSIS — J441 Chronic obstructive pulmonary disease with (acute) exacerbation: Secondary | ICD-10-CM | POA: Diagnosis not present

## 2021-08-18 DIAGNOSIS — E872 Acidosis, unspecified: Secondary | ICD-10-CM | POA: Diagnosis not present

## 2021-08-18 DIAGNOSIS — R000 Tachycardia, unspecified: Secondary | ICD-10-CM | POA: Diagnosis not present

## 2021-08-18 DIAGNOSIS — R062 Wheezing: Secondary | ICD-10-CM | POA: Diagnosis not present

## 2021-08-18 DIAGNOSIS — J441 Chronic obstructive pulmonary disease with (acute) exacerbation: Secondary | ICD-10-CM | POA: Diagnosis not present

## 2021-08-18 DIAGNOSIS — G4700 Insomnia, unspecified: Secondary | ICD-10-CM | POA: Diagnosis not present

## 2021-08-19 DIAGNOSIS — E042 Nontoxic multinodular goiter: Secondary | ICD-10-CM | POA: Diagnosis not present

## 2021-08-19 DIAGNOSIS — R0602 Shortness of breath: Secondary | ICD-10-CM | POA: Diagnosis not present

## 2021-08-19 DIAGNOSIS — J441 Chronic obstructive pulmonary disease with (acute) exacerbation: Secondary | ICD-10-CM | POA: Diagnosis not present

## 2021-08-19 DIAGNOSIS — J449 Chronic obstructive pulmonary disease, unspecified: Secondary | ICD-10-CM | POA: Diagnosis not present

## 2021-08-19 DIAGNOSIS — F411 Generalized anxiety disorder: Secondary | ICD-10-CM | POA: Insufficient documentation

## 2021-08-19 DIAGNOSIS — J9809 Other diseases of bronchus, not elsewhere classified: Secondary | ICD-10-CM | POA: Diagnosis not present

## 2021-08-19 DIAGNOSIS — J398 Other specified diseases of upper respiratory tract: Secondary | ICD-10-CM | POA: Diagnosis not present

## 2021-08-20 DIAGNOSIS — R0602 Shortness of breath: Secondary | ICD-10-CM | POA: Diagnosis not present

## 2021-08-20 DIAGNOSIS — J449 Chronic obstructive pulmonary disease, unspecified: Secondary | ICD-10-CM | POA: Diagnosis not present

## 2021-08-20 DIAGNOSIS — J9809 Other diseases of bronchus, not elsewhere classified: Secondary | ICD-10-CM | POA: Diagnosis not present

## 2021-08-20 DIAGNOSIS — E042 Nontoxic multinodular goiter: Secondary | ICD-10-CM | POA: Diagnosis not present

## 2021-08-20 DIAGNOSIS — K449 Diaphragmatic hernia without obstruction or gangrene: Secondary | ICD-10-CM | POA: Diagnosis not present

## 2021-08-20 DIAGNOSIS — J984 Other disorders of lung: Secondary | ICD-10-CM | POA: Diagnosis not present

## 2021-08-20 DIAGNOSIS — J398 Other specified diseases of upper respiratory tract: Secondary | ICD-10-CM | POA: Diagnosis not present

## 2021-08-20 DIAGNOSIS — K219 Gastro-esophageal reflux disease without esophagitis: Secondary | ICD-10-CM | POA: Diagnosis not present

## 2021-08-20 DIAGNOSIS — J441 Chronic obstructive pulmonary disease with (acute) exacerbation: Secondary | ICD-10-CM | POA: Diagnosis not present

## 2021-08-20 DIAGNOSIS — R918 Other nonspecific abnormal finding of lung field: Secondary | ICD-10-CM | POA: Diagnosis not present

## 2021-08-21 DIAGNOSIS — J449 Chronic obstructive pulmonary disease, unspecified: Secondary | ICD-10-CM | POA: Diagnosis not present

## 2021-08-21 DIAGNOSIS — K449 Diaphragmatic hernia without obstruction or gangrene: Secondary | ICD-10-CM | POA: Diagnosis not present

## 2021-08-21 DIAGNOSIS — E042 Nontoxic multinodular goiter: Secondary | ICD-10-CM | POA: Diagnosis not present

## 2021-08-22 DIAGNOSIS — R0602 Shortness of breath: Secondary | ICD-10-CM | POA: Diagnosis not present

## 2021-08-22 DIAGNOSIS — E042 Nontoxic multinodular goiter: Secondary | ICD-10-CM | POA: Diagnosis not present

## 2021-08-22 DIAGNOSIS — K449 Diaphragmatic hernia without obstruction or gangrene: Secondary | ICD-10-CM | POA: Diagnosis not present

## 2021-08-22 DIAGNOSIS — J398 Other specified diseases of upper respiratory tract: Secondary | ICD-10-CM | POA: Diagnosis not present

## 2021-08-22 DIAGNOSIS — J441 Chronic obstructive pulmonary disease with (acute) exacerbation: Secondary | ICD-10-CM | POA: Diagnosis not present

## 2021-08-22 DIAGNOSIS — J449 Chronic obstructive pulmonary disease, unspecified: Secondary | ICD-10-CM | POA: Diagnosis not present

## 2021-08-22 DIAGNOSIS — J9809 Other diseases of bronchus, not elsewhere classified: Secondary | ICD-10-CM | POA: Diagnosis not present

## 2021-08-23 DIAGNOSIS — R000 Tachycardia, unspecified: Secondary | ICD-10-CM | POA: Diagnosis not present

## 2021-08-23 DIAGNOSIS — G4700 Insomnia, unspecified: Secondary | ICD-10-CM | POA: Diagnosis not present

## 2021-08-23 DIAGNOSIS — J441 Chronic obstructive pulmonary disease with (acute) exacerbation: Secondary | ICD-10-CM | POA: Diagnosis not present

## 2021-08-28 ENCOUNTER — Inpatient Hospital Stay: Payer: Medicare Other | Admitting: Nurse Practitioner

## 2021-08-31 ENCOUNTER — Other Ambulatory Visit: Payer: Self-pay

## 2021-08-31 ENCOUNTER — Encounter: Payer: Self-pay | Admitting: Nurse Practitioner

## 2021-08-31 ENCOUNTER — Ambulatory Visit (INDEPENDENT_AMBULATORY_CARE_PROVIDER_SITE_OTHER): Payer: Medicare Other | Admitting: Nurse Practitioner

## 2021-08-31 VITALS — BP 127/70 | HR 79 | Temp 97.7°F | Ht 62.0 in | Wt 155.4 lb

## 2021-08-31 DIAGNOSIS — J441 Chronic obstructive pulmonary disease with (acute) exacerbation: Secondary | ICD-10-CM

## 2021-08-31 DIAGNOSIS — Z09 Encounter for follow-up examination after completed treatment for conditions other than malignant neoplasm: Secondary | ICD-10-CM

## 2021-08-31 DIAGNOSIS — F411 Generalized anxiety disorder: Secondary | ICD-10-CM | POA: Diagnosis not present

## 2021-08-31 DIAGNOSIS — R0602 Shortness of breath: Secondary | ICD-10-CM | POA: Diagnosis not present

## 2021-08-31 DIAGNOSIS — K219 Gastro-esophageal reflux disease without esophagitis: Secondary | ICD-10-CM

## 2021-08-31 MED ORDER — LORAZEPAM 0.5 MG PO TABS: ORAL_TABLET | ORAL | 1 refills | Status: DC

## 2021-08-31 MED ORDER — PANTOPRAZOLE SODIUM 20 MG PO TBEC: 20.0000 mg | DELAYED_RELEASE_TABLET | Freq: Every day | ORAL | 1 refills | Status: DC

## 2021-08-31 NOTE — Progress Notes (Signed)
Established Patient Office Visit  Subjective:  Patient ID: Stephanie Hess, female    DOB: 1951-05-08  Age: 70 y.o. MRN: [id number]  CC:  Chief Complaint  Patient presents with   Hospitalization Follow-up    HPI Stephanie Hess presents for hospital follow up. She was hospitalized at [facility] for COPD exacerbation. Was discharged last Thursday, 08/23/2021. Reviewed PFT and six minute walk during her stay. Volumes were slightly low, but, per pulmonology, OK. 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 Has ENT follow up next Friday. She does not have a follow up scheduled with her pulmonologist, though he did see her while in the hospital.  Did start her on Trelegy daily.  This is a maintenance inhaler for COPD.  She does not feel like she is getting this into her lungs well enough.  She feels like she was doing better with Spiriva and the alternating between rescue inhaler and nebulizer treatments every 4-6 hours when needed.  She does admit to some improvement in shortness of breath.  Does have anxiety, especially when lying down.  This increases her shortness of breath and ability to move air.  Past Medical History:  Diagnosis Date   Cancer ([place])    COPD (chronic obstructive pulmonary disease) ([place])    Diverticula of colon    GERD (gastroesophageal reflux disease)    Hyperlipidemia    Renal insufficiency    2009 or 2010    Past Surgical History:  Procedure Laterality Date   APPENDECTOMY     CESAREAN SECTION     ECTOPIC PREGNANCY SURGERY      Family History  Problem Relation Age of Onset   Breast cancer Paternal Aunt    Heart attack Mother     Social History   Socioeconomic History   Marital status: Divorced    Spouse name: Not on file   Number of children: Not on file   Years of education: Not on file   Highest education level: Not on file  Occupational History   Not on file  Tobacco Use   Smoking status: Former    Packs/day: 0.50     Years: 50.00    Pack years: 25.00    Types: Cigarettes   Smokeless tobacco: Never  Vaping Use   Vaping Use: Never used  Substance and Sexual Activity   Alcohol use: Yes    Comment: social   Drug use: No   Sexual activity: Not Currently  Other Topics Concern   Not on file  Social History Narrative   Not on file   Social Determinants of Health   Financial Resource Strain: Not on file  Food Insecurity: Not on file  Transportation Needs: Not on file  Physical Activity: Not on file  Stress: Not on file  Social Connections: Not on file  Intimate Partner Violence: Not on file    Outpatient Medications Prior to Visit  Medication Sig Dispense Refill   aspirin EC 81 MG tablet Take 1 tablet (81 mg total) by mouth daily. 90 tablet 3   busPIRone (BUSPAR) 10 MG tablet Take 1/2 to 1 tablet up to three times daily as needed for anxiety 90 tablet 2   calcium-vitamin D (OSCAL WITH D) 500-200 MG-UNIT tablet Take 1 tablet by mouth.     hydrOXYzine (ATARAX/VISTARIL) 10 MG tablet Take 1 tablet (10 mg total) by mouth 3 (three) times daily as needed for anxiety. 30 tablet 0   ipratropium (ATROVENT) 0.02 % nebulizer  solution Take 2.5 mLs (0.5 mg total) by nebulization 4 (four) times daily. 75 mL 3   levalbuterol (XOPENEX HFA) 45 MCG/ACT inhaler Inhale 1 puff into the lungs every 6 (six) hours as needed for wheezing. 1 each 3   OXYGEN Inhale into the lungs. 2 litre at night     Tiotropium Bromide Monohydrate (SPIRIVA RESPIMAT) 2.5 MCG/ACT AERS Inhale 2 puffs into the lungs daily. 1 each 3   levofloxacin (LEVAQUIN) 500 MG tablet Take 1 tablet (500 mg total) by mouth daily. 7 tablet 0   omeprazole (PRILOSEC) 20 MG capsule Take 20 mg by mouth daily.     No facility-administered medications prior to visit.    Allergies  Allergen Reactions   Penicillins Nausea And Vomiting and Other (See Comments)    Has patient had a PCN reaction causing immediate rash, facial/tongue/throat swelling, SOB or  lightheadedness with hypotension: No Has patient had a PCN reaction causing severe rash involving mucus membranes or skin necrosis: No Has patient had a PCN reaction that required hospitalization: No Has patient had a PCN reaction occurring within the last 10 years: Yes If all of the above answers are "NO", then may proceed with Cephalosporin use.    Shellfish Allergy Nausea And Vomiting   Sulfa Antibiotics Rash and Other (See Comments)    Other reaction(s): Unknown   Clarithromycin Rash and Nausea Only    rash   Amoxicillin-Pot Clavulanate Nausea And Vomiting   Iodinated Diagnostic Agents Other (See Comments)    acute renal insufficiency acute renal insufficiency   Atorvastatin Rash   Macrobid [Nitrofurantoin] Rash    ROS Review of Systems  Constitutional:  Positive for activity change and fatigue. Negative for appetite change, chills and fever.  HENT:  Negative for congestion, postnasal drip, rhinorrhea, sinus pressure, sinus pain, sneezing and sore throat.   Eyes: Negative.   Respiratory:  Positive for chest tightness, shortness of breath and stridor. Negative for cough and wheezing.   Cardiovascular:  Negative for chest pain and palpitations.  Gastrointestinal:  Negative for abdominal pain, constipation, diarrhea, nausea and vomiting.  Endocrine: Negative for cold intolerance, heat intolerance, polydipsia and polyuria.       Recent discovery of thyroid nodules during hospitalization.   Genitourinary:  Negative for dyspareunia, dysuria, flank pain, frequency and urgency.  Musculoskeletal:  Negative for arthralgias, back pain and myalgias.  Skin:  Negative for rash.  Allergic/Immunologic: Negative for environmental allergies.  Neurological:  Negative for dizziness, weakness and headaches.  Hematological:  Negative for adenopathy.  Psychiatric/Behavioral:  Positive for sleep disturbance. The patient is nervous/anxious.      Objective:    Physical Exam Vitals and nursing note  reviewed.  Constitutional:      General: She is in acute distress.     Appearance: Normal appearance. She is well-developed.  HENT:     Head: Normocephalic and atraumatic.     Nose: Nose normal.     Mouth/Throat:     Mouth: Mucous membranes are moist.  Eyes:     Extraocular Movements: Extraocular movements intact.     Conjunctiva/sclera: Conjunctivae normal.     Pupils: Pupils are equal, round, and reactive to light.  Neck:     Vascular: No carotid bruit.  Cardiovascular:     Rate and Rhythm: Normal rate and regular rhythm.     Pulses: Normal pulses.     Heart sounds: Normal heart sounds.  Pulmonary:     Effort: Pulmonary effort is normal.  Breath sounds: Normal breath sounds.     Comments: Breath sounds are clear but diminished bilaterally.  Loose, nonproductive cough still present. Abdominal:     Palpations: Abdomen is soft.  Musculoskeletal:        General: Normal range of motion.     Cervical back: Normal range of motion and neck supple.  Skin:    General: Skin is warm and dry.     Capillary Refill: Capillary refill takes less than 2 seconds.  Neurological:     General: No focal deficit present.     Mental Status: She is alert and oriented to person, place, and time.  Psychiatric:        Attention and Perception: Attention and perception normal.        Mood and Affect: Affect normal. Mood is anxious.        Speech: Speech normal.        Behavior: Behavior normal. Behavior is cooperative.        Thought Content: Thought content normal.        Cognition and Memory: Cognition and memory normal.        Judgment: Judgment normal.    Today's Vitals   08/31/21 0959  BP: 127/70  Pulse: 79  Temp: 97.7 F (36.5 C)  SpO2: 95%  Weight: 155 lb 6.4 oz (70.5 kg)  Height: 5' 2"  (1.575 m)   Body mass index is 28.42 kg/m.   Wt Readings from Last 3 Encounters:  08/31/21 155 lb 6.4 oz (70.5 kg)  08/08/21 153 lb 12.8 oz (69.8 kg)  07/12/21 154 lb (69.9 kg)     Health  Maintenance Due  Topic Date Due   Pneumonia Vaccine 20+ Years old (1 - PCV) Never done   Zoster Vaccines- Shingrix (1 of 2) Never done   DEXA SCAN  Never done   COVID-19 Vaccine (3 - Booster for Moderna series) 05/14/2021   INFLUENZA VACCINE  06/11/2021    There are no preventive care reminders to display for this patient.  Lab Results  Component Value Date   TSH 1.510 01/19/2021   Lab Results  Component Value Date   WBC 16.7 (H) 07/14/2021   HGB 14.1 07/14/2021   HCT 41.8 07/14/2021   MCV 91.1 07/14/2021   PLT 337 07/14/2021   Lab Results  Component Value Date   NA 140 07/14/2021   K 4.7 07/14/2021   CO2 22 07/14/2021   GLUCOSE 146 (H) 07/14/2021   BUN 14 07/14/2021   CREATININE 0.73 07/14/2021   BILITOT 0.6 07/12/2021   ALKPHOS 71 07/12/2021   AST 31 07/12/2021   ALT 27 07/12/2021   PROT 8.1 07/12/2021   ALBUMIN 4.4 07/12/2021   CALCIUM 9.7 07/14/2021   ANIONGAP 10 07/14/2021   EGFR 88 01/19/2021   Lab Results  Component Value Date   CHOL 278 (H) 01/19/2021   Lab Results  Component Value Date   HDL 41 01/19/2021   Lab Results  Component Value Date   LDLCALC 180 (H) 01/19/2021   Lab Results  Component Value Date   TRIG 294 (H) 01/19/2021   Lab Results  Component Value Date   CHOLHDL 8.1 (H) 08/12/2018   Lab Results  Component Value Date   HGBA1C 6.1 (H) 08/12/2018      Assessment & Plan:  1. Hospital discharge follow-up Patient recently hospitalized from 08/14/2021 through 08/25/2019.  For COPD exacerbation.  Reviewed progress notes, labs, and imaging studies from hospitalization with patient and her daughter  who is present during today's visit.  2. Chronic obstructive pulmonary disease with (acute) exacerbation ([place]) Trelegy started and Spiriva stopped during hospitalization.  Patient reports increased shortness of breath since medication change.  For now, we will have her go back to Spiriva daily and using rescue inhaler/nebulizer treatments  every 4 hours as needed for shortness of breath and cough.  Strongly encouraged her to make follow-up appointment with pulmonology for further evaluation and treatment.  3. Shortness of breath Trelegy started and Spiriva stopped during hospitalization.  Patient reports increased shortness of breath since medication change.  For now, we will have her go back to Spiriva daily and using rescue inhaler/nebulizer treatments every 4 hours as needed for shortness of breath and cough.  Strongly encouraged her to make follow-up appointment with pulmonology for further evaluation and treatment.  4. Generalized anxiety disorder Short-term prescription for lorazepam 0.5 mg tablets given.  Patient may take 1/2 to 1 tablet up to twice daily as needed for acute anxiety.  We will reassess anxiety levels at next visit. - LORazepam (ATIVAN) 0.5 MG tablet; Take 1/2 to 1 tablet po BID prn anxiety  Dispense: 60 tablet; Refill: 1  5. Gastroesophageal reflux disease without esophagitis Change omeprazole to pantoprazole 20 mg daily.  New prescription sent to pharmacy. - pantoprazole (PROTONIX) 20 MG tablet; Take 1 tablet (20 mg total) by mouth daily.  Dispense: 90 tablet; Refill: 1   Problem List Items Addressed This Visit       Respiratory   Chronic obstructive pulmonary disease with (acute) exacerbation (HCC)     Digestive   Gastroesophageal reflux disease without esophagitis   Relevant Medications   pantoprazole (PROTONIX) 20 MG tablet     Other   Generalized anxiety disorder   Relevant Medications   LORazepam (ATIVAN) 0.5 MG tablet   Shortness of breath   Other Visit Diagnoses     Hospital discharge follow-up    -  Primary       Meds ordered this encounter  Medications   pantoprazole (PROTONIX) 20 MG tablet    Sig: Take 1 tablet (20 mg total) by mouth daily.    Dispense:  90 tablet    Refill:  1    Order Specific Question:   Supervising Provider    Answer:   Beatrice Lecher D [[id number]]    LORazepam (ATIVAN) 0.5 MG tablet    Sig: Take 1/2 to 1 tablet po BID prn anxiety    Dispense:  60 tablet    Refill:  1    Order Specific Question:   Supervising Provider    Answer:   Beatrice Lecher D [[id number]]    Follow-up: Return for medicare wellness, as scheduled.    Ronnell Freshwater, NP  This note was dictated using Systems analyst. Rapid proofreading was performed to expedite the delivery of the information. Despite proofreading, phonetic errors will occur which are common with this voice recognition software. Please take this into consideration. If there are any concerns, please contact our office.

## 2021-09-02 DIAGNOSIS — R0602 Shortness of breath: Secondary | ICD-10-CM | POA: Insufficient documentation

## 2021-09-04 DIAGNOSIS — R0602 Shortness of breath: Secondary | ICD-10-CM | POA: Diagnosis not present

## 2021-09-04 DIAGNOSIS — J849 Interstitial pulmonary disease, unspecified: Secondary | ICD-10-CM | POA: Diagnosis not present

## 2021-09-04 DIAGNOSIS — G4734 Idiopathic sleep related nonobstructive alveolar hypoventilation: Secondary | ICD-10-CM | POA: Diagnosis not present

## 2021-09-07 DIAGNOSIS — Z881 Allergy status to other antibiotic agents status: Secondary | ICD-10-CM | POA: Diagnosis not present

## 2021-09-07 DIAGNOSIS — E041 Nontoxic single thyroid nodule: Secondary | ICD-10-CM | POA: Diagnosis not present

## 2021-09-07 DIAGNOSIS — Z87891 Personal history of nicotine dependence: Secondary | ICD-10-CM | POA: Diagnosis not present

## 2021-09-07 DIAGNOSIS — Z7951 Long term (current) use of inhaled steroids: Secondary | ICD-10-CM | POA: Diagnosis not present

## 2021-09-07 DIAGNOSIS — Z79899 Other long term (current) drug therapy: Secondary | ICD-10-CM | POA: Diagnosis not present

## 2021-09-07 DIAGNOSIS — J441 Chronic obstructive pulmonary disease with (acute) exacerbation: Secondary | ICD-10-CM | POA: Diagnosis not present

## 2021-09-07 DIAGNOSIS — Z792 Long term (current) use of antibiotics: Secondary | ICD-10-CM | POA: Diagnosis not present

## 2021-09-07 DIAGNOSIS — Z7982 Long term (current) use of aspirin: Secondary | ICD-10-CM | POA: Diagnosis not present

## 2021-09-07 DIAGNOSIS — K449 Diaphragmatic hernia without obstruction or gangrene: Secondary | ICD-10-CM | POA: Diagnosis not present

## 2021-09-07 DIAGNOSIS — K219 Gastro-esophageal reflux disease without esophagitis: Secondary | ICD-10-CM | POA: Diagnosis not present

## 2021-09-15 DIAGNOSIS — J441 Chronic obstructive pulmonary disease with (acute) exacerbation: Secondary | ICD-10-CM | POA: Diagnosis not present

## 2021-09-17 DIAGNOSIS — E041 Nontoxic single thyroid nodule: Secondary | ICD-10-CM | POA: Diagnosis not present

## 2021-09-17 DIAGNOSIS — J4520 Mild intermittent asthma, uncomplicated: Secondary | ICD-10-CM | POA: Diagnosis not present

## 2021-09-18 DIAGNOSIS — M816 Localized osteoporosis [Lequesne]: Secondary | ICD-10-CM | POA: Diagnosis not present

## 2021-09-18 DIAGNOSIS — Z7951 Long term (current) use of inhaled steroids: Secondary | ICD-10-CM | POA: Diagnosis not present

## 2021-09-18 DIAGNOSIS — R0902 Hypoxemia: Secondary | ICD-10-CM | POA: Diagnosis not present

## 2021-09-18 DIAGNOSIS — I2510 Atherosclerotic heart disease of native coronary artery without angina pectoris: Secondary | ICD-10-CM | POA: Diagnosis not present

## 2021-09-18 DIAGNOSIS — J45901 Unspecified asthma with (acute) exacerbation: Secondary | ICD-10-CM | POA: Diagnosis not present

## 2021-09-18 DIAGNOSIS — R0602 Shortness of breath: Secondary | ICD-10-CM | POA: Diagnosis not present

## 2021-09-18 DIAGNOSIS — F17210 Nicotine dependence, cigarettes, uncomplicated: Secondary | ICD-10-CM | POA: Diagnosis not present

## 2021-09-18 DIAGNOSIS — J441 Chronic obstructive pulmonary disease with (acute) exacerbation: Secondary | ICD-10-CM | POA: Diagnosis not present

## 2021-09-18 DIAGNOSIS — J849 Interstitial pulmonary disease, unspecified: Secondary | ICD-10-CM | POA: Diagnosis not present

## 2021-09-18 DIAGNOSIS — G4734 Idiopathic sleep related nonobstructive alveolar hypoventilation: Secondary | ICD-10-CM | POA: Diagnosis not present

## 2021-09-19 ENCOUNTER — Other Ambulatory Visit: Payer: Self-pay | Admitting: Internal Medicine

## 2021-10-15 DIAGNOSIS — J441 Chronic obstructive pulmonary disease with (acute) exacerbation: Secondary | ICD-10-CM | POA: Diagnosis not present

## 2021-10-17 DIAGNOSIS — R0602 Shortness of breath: Secondary | ICD-10-CM | POA: Diagnosis not present

## 2021-10-17 DIAGNOSIS — J4520 Mild intermittent asthma, uncomplicated: Secondary | ICD-10-CM | POA: Diagnosis not present

## 2021-10-17 DIAGNOSIS — E782 Mixed hyperlipidemia: Secondary | ICD-10-CM | POA: Diagnosis not present

## 2021-10-17 DIAGNOSIS — I2510 Atherosclerotic heart disease of native coronary artery without angina pectoris: Secondary | ICD-10-CM | POA: Diagnosis not present

## 2021-11-07 ENCOUNTER — Encounter: Payer: Self-pay | Admitting: Nurse Practitioner

## 2021-11-07 ENCOUNTER — Ambulatory Visit (INDEPENDENT_AMBULATORY_CARE_PROVIDER_SITE_OTHER): Payer: Medicare Other | Admitting: Nurse Practitioner

## 2021-11-07 ENCOUNTER — Other Ambulatory Visit: Payer: Self-pay

## 2021-11-07 VITALS — BP 120/74 | HR 97 | Temp 97.9°F | Ht 62.0 in | Wt 175.0 lb

## 2021-11-07 DIAGNOSIS — K219 Gastro-esophageal reflux disease without esophagitis: Secondary | ICD-10-CM | POA: Diagnosis not present

## 2021-11-07 DIAGNOSIS — J849 Interstitial pulmonary disease, unspecified: Secondary | ICD-10-CM | POA: Diagnosis not present

## 2021-11-07 DIAGNOSIS — Z1231 Encounter for screening mammogram for malignant neoplasm of breast: Secondary | ICD-10-CM | POA: Diagnosis not present

## 2021-11-07 DIAGNOSIS — J441 Chronic obstructive pulmonary disease with (acute) exacerbation: Secondary | ICD-10-CM | POA: Diagnosis not present

## 2021-11-07 DIAGNOSIS — J449 Chronic obstructive pulmonary disease, unspecified: Secondary | ICD-10-CM

## 2021-11-07 DIAGNOSIS — M816 Localized osteoporosis [Lequesne]: Secondary | ICD-10-CM | POA: Diagnosis not present

## 2021-11-07 DIAGNOSIS — Z0000 Encounter for general adult medical examination without abnormal findings: Secondary | ICD-10-CM

## 2021-11-07 DIAGNOSIS — Z7952 Long term (current) use of systemic steroids: Secondary | ICD-10-CM | POA: Diagnosis not present

## 2021-11-07 DIAGNOSIS — Z780 Asymptomatic menopausal state: Secondary | ICD-10-CM | POA: Diagnosis not present

## 2021-11-07 DIAGNOSIS — F411 Generalized anxiety disorder: Secondary | ICD-10-CM

## 2021-11-07 DIAGNOSIS — R0602 Shortness of breath: Secondary | ICD-10-CM | POA: Diagnosis not present

## 2021-11-07 DIAGNOSIS — J45901 Unspecified asthma with (acute) exacerbation: Secondary | ICD-10-CM | POA: Diagnosis not present

## 2021-11-07 DIAGNOSIS — E041 Nontoxic single thyroid nodule: Secondary | ICD-10-CM | POA: Diagnosis not present

## 2021-11-07 DIAGNOSIS — G4734 Idiopathic sleep related nonobstructive alveolar hypoventilation: Secondary | ICD-10-CM | POA: Diagnosis not present

## 2021-11-07 MED ORDER — TRAZODONE HCL 50 MG PO TABS: 25.0000 mg | ORAL_TABLET | Freq: Every evening | ORAL | 3 refills | Status: DC | PRN

## 2021-11-07 MED ORDER — FAMOTIDINE 20 MG PO TABS: 20.0000 mg | ORAL_TABLET | Freq: Two times a day (BID) | ORAL | 3 refills | Status: DC

## 2021-11-07 NOTE — Progress Notes (Signed)
Subjective:   Stephanie Hess is a 70 y.o. female who presents for Medicare Annual (Subsequent) preventive examination. She continues to see pulmonology at [facility]. She is having to use nebulizer treatments every six hours. She is using trelegy once daily.  She is having trouble with indigestion. She states that if she eats late, she will cough up pieces of food during the night. She states that protonix did not help very much and went back to prilosec which works better for her. She is concerned about a 20 pound weight gain since her most recent visit with me in October 2022.  She has taken multiple rounds of prednisone due to COPD exacerbations.  Has been unable to exercise routinely due to shortness of breath and wheezing. She denies chest pain, chest pressure, or headaches. She is scheduled for a bone density test today. She is due to have screening mammogram.  Review of Systems    Review of Systems  Constitutional:  Negative for fever, malaise/fatigue and weight loss.       Twenty pound weight gain since her last visit in 08/2021.  HENT:  Negative for congestion, ear discharge, ear pain, hearing loss and sore throat.   Eyes: Negative.   Respiratory:  Positive for shortness of breath and wheezing. Negative for cough.        Is following up with pulmonology at [facility].   Cardiovascular:  Negative for chest pain and orthopnea.  Gastrointestinal:  Negative for abdominal pain, blood in stool, constipation, diarrhea, nausea and vomiting.  Genitourinary:  Negative for dysuria, flank pain, frequency and urgency.  Musculoskeletal:  Negative for back pain and myalgias.  Skin:  Negative for itching and rash.  Neurological:  Negative for dizziness, weakness and headaches.  Endo/Heme/Allergies:        Thyroid nodule which is being followed per endocrinology at [facility]  Psychiatric/Behavioral:  Negative for depression. The patient is nervous/anxious.          Objective:   Physical  Exam Vitals and nursing note reviewed.  Constitutional:      Appearance: Normal appearance. She is well-developed.  HENT:     Head: Normocephalic and atraumatic.     Right Ear: Tympanic membrane, ear canal and external ear normal.     Left Ear: Tympanic membrane, ear canal and external ear normal.     Nose: Nose normal.     Mouth/Throat:     Mouth: Mucous membranes are moist.     Pharynx: Oropharynx is clear.  Eyes:     Extraocular Movements: Extraocular movements intact.     Conjunctiva/sclera: Conjunctivae normal.     Pupils: Pupils are equal, round, and reactive to light.  Neck:     Vascular: No carotid bruit.  Cardiovascular:     Rate and Rhythm: Normal rate and regular rhythm.     Pulses: Normal pulses.     Heart sounds: Normal heart sounds.  Pulmonary:     Effort: Pulmonary effort is normal.     Breath sounds: Normal breath sounds.  Abdominal:     General: Bowel sounds are normal. There is no distension.     Palpations: Abdomen is soft. There is no mass.     Tenderness: There is no abdominal tenderness. There is no guarding or rebound.     Hernia: No hernia is present.  Musculoskeletal:        General: Normal range of motion.     Cervical back: Normal range of motion and neck  supple.  Lymphadenopathy:     Cervical: No cervical adenopathy.  Skin:    General: Skin is warm and dry.     Capillary Refill: Capillary refill takes less than 2 seconds.  Neurological:     General: No focal deficit present.     Mental Status: She is alert and oriented to person, place, and time.  Psychiatric:        Mood and Affect: Mood normal.        Behavior: Behavior normal.        Thought Content: Thought content normal.        Judgment: Judgment normal.    Today's Vitals   11/07/21 1031  BP: 120/74  Pulse: 97  Temp: 97.9 F (36.6 C)  SpO2: 97%  Weight: 175 lb 0.1 oz (79.4 kg)  Height: 5\' 2"  (1.575 m)   Body mass index is 32.01 kg/m.  Advanced Directives 07/12/2021 04/05/2021  07/17/2018 07/16/2018 07/16/2018 07/11/2018 06/04/2017  Does Patient Have a Medical Advance Directive? No No No No No No No  Would patient like information on creating a medical advance directive? No - Patient declined - No - Patient declined No - Patient declined - No - Patient declined -    Current Medications (verified) Outpatient Encounter Medications as of 11/07/2021  Medication Sig   aspirin 81 MG chewable tablet Chew by mouth.   aspirin EC 81 MG tablet Take 1 tablet (81 mg total) by mouth daily.   azithromycin (ZITHROMAX) 250 MG tablet Take by mouth.   Budeson-Glycopyrrol-Formoterol (BREZTRI AEROSPHERE) 160-9-4.8 MCG/ACT AERO Inhale into the lungs.   calcium-vitamin D (OSCAL WITH D) 500-200 MG-UNIT tablet Take 1 tablet by mouth.   famotidine (PEPCID) 20 MG tablet Take 1 tablet (20 mg total) by mouth 2 (two) times daily.   formoterol (PERFOROMIST) 20 MCG/2ML nebulizer solution INHALE 2 ML (20 MCG TOTAL) BY NEBULIZATION TWO (2) TIMES A DAY.   ipratropium (ATROVENT) 0.02 % nebulizer solution Take 2.5 mLs (0.5 mg total) by nebulization 4 (four) times daily.   levalbuterol (XOPENEX HFA) 45 MCG/ACT inhaler Inhale 1 puff into the lungs every 6 (six) hours as needed for wheezing.   LORazepam (ATIVAN) 0.5 MG tablet Take 1/2 to 1 tablet po BID prn anxiety   OXYGEN Inhale into the lungs. 2 litre at night   predniSONE (DELTASONE) 20 MG tablet Take 20 mg by mouth 2 (two) times daily.   rosuvastatin (CRESTOR) 5 MG tablet Take 5 mg by mouth daily.   Tiotropium Bromide Monohydrate (SPIRIVA RESPIMAT) 2.5 MCG/ACT AERS Inhale 2 puffs into the lungs daily.   traZODone (DESYREL) 50 MG tablet Take 0.5-1 tablets (25-50 mg total) by mouth at bedtime as needed for sleep.   TRELEGY ELLIPTA 100-62.5-25 MCG/ACT AEPB Inhale 1 puff into the lungs daily.   [DISCONTINUED] busPIRone (BUSPAR) 10 MG tablet Take 1/2 to 1 tablet up to three times daily as needed for anxiety   [DISCONTINUED] pantoprazole (PROTONIX) 20 MG tablet  Take 1 tablet (20 mg total) by mouth daily.   hydrOXYzine (ATARAX/VISTARIL) 10 MG tablet Take 1 tablet (10 mg total) by mouth 3 (three) times daily as needed for anxiety. (Patient not taking: Reported on 11/07/2021)   No facility-administered encounter medications on file as of 11/07/2021.    Allergies (verified) Penicillins, Shellfish allergy, Sulfa antibiotics, Clarithromycin, Amoxicillin-pot clavulanate, Iodinated contrast media, Atorvastatin, and Macrobid [nitrofurantoin]   History: Past Medical History:  Diagnosis Date   Cancer ([place])    COPD (chronic obstructive pulmonary disease) ([place])  Diverticula of colon    GERD (gastroesophageal reflux disease)    Hyperlipidemia    Renal insufficiency    2009 or 2010   Past Surgical History:  Procedure Laterality Date   APPENDECTOMY     CESAREAN SECTION     ECTOPIC PREGNANCY SURGERY     Family History  Problem Relation Age of Onset   Breast cancer Paternal Aunt    Heart attack Mother    Social History   Socioeconomic History   Marital status: Divorced    Spouse name: Not on file   Number of children: Not on file   Years of education: Not on file   Highest education level: Not on file  Occupational History   Not on file  Tobacco Use   Smoking status: Former    Packs/day: 0.50    Years: 50.00    Pack years: 25.00    Types: Cigarettes   Smokeless tobacco: Never  Vaping Use   Vaping Use: Never used  Substance and Sexual Activity   Alcohol use: Yes    Comment: social   Drug use: No   Sexual activity: Not Currently  Other Topics Concern   Not on file  Social History Narrative   Not on file   Social Determinants of Health   Financial Resource Strain: Not on file  Food Insecurity: Not on file  Transportation Needs: Not on file  Physical Activity: Not on file  Stress: Not on file  Social Connections: Not on file    Tobacco Counseling Patient has recently quit smoking    diabetic?no   Activities of Daily  Living In your present state of health, do you have any difficulty performing the following activities: 11/07/2021 08/08/2021  Hearing? N N  Vision? N N  Comment - -  Difficulty concentrating or making decisions? N N  Walking or climbing stairs? Y Y  Comment - -  Dressing or bathing? N N  Doing errands, shopping? N N  Some recent data might be hidden    Patient Care Team: Ronnell Freshwater, NP as PCP - General (Family Medicine)     Assessment:  1. Encounter for Medicare annual wellness exam Medicare wellness visit today   2. Chronic obstructive pulmonary disease, unspecified COPD type ([place]) Currently stable. Continue regular visits with [facility] pulmonology as scheduled.   3. Gastroesophageal reflux disease without esophagitis Continue pepcid as prescribed. Refills provided today.  - famotidine (PEPCID) 20 MG tablet; Take 1 tablet (20 mg total) by mouth 2 (two) times daily.  Dispense: 60 tablet; Refill: 3  4. Generalized anxiety disorder May take trazodone 50mg , taking 1/2 to 1 tablet at bedtime as needed for anxiety/insomnia. Refills provided today.  - traZODone (DESYREL) 50 MG tablet; Take 0.5-1 tablets (25-50 mg total) by mouth at bedtime as needed for sleep.  Dispense: 30 tablet; Refill: 3  5. Encounter for screening mammogram for malignant neoplasm of breast Screening mammogram ordered today.  - MM DIGITAL SCREENING BILATERAL; Future   Hearing/Vision screen No results found.   Depression Screen PHQ 2/9 Scores 11/07/2021 08/31/2021 08/08/2021 04/17/2021 09/12/2020 02/01/2020 11/20/2018  PHQ - 2 Score 0 0 2 0 0 0 0  PHQ- 9 Score 0 0 7 7 - - -    Fall Risk Fall Risk  11/07/2021 08/31/2021 08/08/2021 04/17/2021 09/12/2020  Falls in the past year? 0 0 0 0 0  Number falls in past yr: 0 0 0 0 -  Injury with Fall? 0 0 0 0 -  Follow up Falls evaluation completed Falls evaluation completed Falls evaluation completed Falls evaluation completed -    FALL RISK PREVENTION PERTAINING TO  THE HOME:  Any stairs in or around the home? Yes  If so, are there any without handrails? No  Home free of loose throw rugs in walkways, pet beds, electrical cords, etc? Yes  Adequate lighting in your home to reduce risk of falls? Yes   ASSISTIVE DEVICES UTILIZED TO PREVENT FALLS:  Life alert? No  Use of a cane, walker or w/c? No  Grab bars in the bathroom? No  Shower chair or bench in shower? No  Elevated toilet seat or a handicapped toilet? No   TIMED UP AND GO:  Was the test performed? Yes .  Length of time to ambulate 10 feet: 12 sec.   Gait steady and fast without use of assistive device  Cognitive Function:     6CIT Screen 11/07/2021  What Year? 0 points  What month? 0 points  What time? 0 points  Count back from 20 0 points  Months in reverse 0 points  Repeat phrase 2 points  Total Score 2    Immunizations Immunization History  Administered Date(s) Administered   Influenza Split 09/21/2014   Influenza, High Dose Seasonal PF 08/18/2018   Moderna Sars-Covid-2 Vaccination 10/18/2020, 03/19/2021   Tdap 09/21/2014    TDAP status: Up to date  Flu Vaccine status: Declined, Education has been provided regarding the importance of this vaccine but patient still declined. Advised may receive this vaccine at local pharmacy or Health Dept. Aware to provide a copy of the vaccination record if obtained from local pharmacy or Health Dept. Verbalized acceptance and understanding.  Pneumococcal vaccine status: Declined,  Education has been provided regarding the importance of this vaccine but patient still declined. Advised may receive this vaccine at local pharmacy or Health Dept. Aware to provide a copy of the vaccination record if obtained from local pharmacy or Health Dept. Verbalized acceptance and understanding.   Covid-19 vaccine status: Completed vaccines  Qualifies for Shingles Vaccine? Yes   Zostavax completed No   Shingrix Completed?: No.    Education has been  provided regarding the importance of this vaccine. Patient has been advised to call insurance company to determine out of pocket expense if they have not yet received this vaccine. Advised may also receive vaccine at local pharmacy or Health Dept. Verbalized acceptance and understanding.  Screening Tests Health Maintenance  Topic Date Due   Zoster Vaccines- Shingrix (1 of 2) Never done   COVID-19 Vaccine (3 - Booster for Moderna series) 05/14/2021   MAMMOGRAM  10/25/2021   INFLUENZA VACCINE  02/08/2022 (Originally 06/11/2021)   Pneumonia Vaccine 8+ Years old (1 - PCV) 11/07/2022 (Originally 09/25/1957)   DEXA SCAN  11/07/2022 (Originally 09/25/2017)   TETANUS/TDAP  09/21/2024   COLONOSCOPY (Pts 45-27yrs Insurance coverage will need to be confirmed)  09/20/2029   Hepatitis C Screening  Completed   HPV VACCINES  Aged Out    Health Maintenance  Health Maintenance Due  Topic Date Due   Zoster Vaccines- Shingrix (1 of 2) Never done   COVID-19 Vaccine (3 - Booster for Moderna series) 05/14/2021   MAMMOGRAM  10/25/2021    Colorectal cancer screening: Type of screening: Colonoscopy. Completed 2020. Repeat every 7 years  Mammogram status: Ordered 2022. Pt provided with contact info and advised to call to schedule appt.   Bone Density status: Ordered 2022. Pt provided with contact info and advised to  call to schedule appt.  Lung Cancer Screening: (Low Dose CT Chest recommended if Age 37-80 years, 30 pack-year currently smoking OR have quit w/in 15years.) does qualify.   Lung Cancer Screening Referral: no  Additional Screening:  Hepatitis C Screening: does qualify; Completed 2021  Vision Screening: Recommended annual ophthalmology exams for early detection of glaucoma and other disorders of the eye. Is the patient up to date with their annual eye exam?  Yes  Who is the provider or what is the name of the office in which the patient attends annual eye exams? Yes  If pt is not  established with a provider, would they like to be referred to a provider to establish care? No .   Dental Screening: Recommended annual dental exams for proper oral hygiene  Community Resource Referral / Chronic Care Management: CRR required this visit?  No     Plan:     I have personally reviewed and noted the following in the patients chart:   Medical and social history Use of alcohol, tobacco or illicit drugs  Current medications and supplements including opioid prescriptions.  Functional ability and status Nutritional status Physical activity Advanced directives List of other physicians Hospitalizations, surgeries, and ER visits in previous 12 months Vitals Screenings to include cognitive, depression, and falls Referrals and appointments  In addition, I have reviewed and discussed with patient certain preventive protocols, quality metrics, and best practice recommendations. A written personalized care plan for preventive services as well as general preventive health recommendations were provided to patient.     Leretha Pol, FNP-c 11/12/2021

## 2021-11-09 ENCOUNTER — Other Ambulatory Visit: Payer: Self-pay | Admitting: Nurse Practitioner

## 2021-11-09 DIAGNOSIS — F411 Generalized anxiety disorder: Secondary | ICD-10-CM

## 2021-11-15 DIAGNOSIS — J441 Chronic obstructive pulmonary disease with (acute) exacerbation: Secondary | ICD-10-CM | POA: Diagnosis not present

## 2021-11-17 DIAGNOSIS — J4520 Mild intermittent asthma, uncomplicated: Secondary | ICD-10-CM | POA: Diagnosis not present

## 2021-12-16 DIAGNOSIS — J441 Chronic obstructive pulmonary disease with (acute) exacerbation: Secondary | ICD-10-CM | POA: Diagnosis not present

## 2021-12-18 DIAGNOSIS — J4520 Mild intermittent asthma, uncomplicated: Secondary | ICD-10-CM | POA: Diagnosis not present

## 2022-01-13 DIAGNOSIS — J441 Chronic obstructive pulmonary disease with (acute) exacerbation: Secondary | ICD-10-CM | POA: Diagnosis not present

## 2022-01-14 ENCOUNTER — Other Ambulatory Visit: Payer: Self-pay | Admitting: Nurse Practitioner

## 2022-01-14 ENCOUNTER — Encounter: Payer: Self-pay | Admitting: Nurse Practitioner

## 2022-01-14 ENCOUNTER — Other Ambulatory Visit: Payer: Self-pay

## 2022-01-14 ENCOUNTER — Ambulatory Visit (INDEPENDENT_AMBULATORY_CARE_PROVIDER_SITE_OTHER): Payer: Medicare Other | Admitting: Nurse Practitioner

## 2022-01-14 VITALS — BP 105/69 | HR 109 | Temp 97.6°F | Ht 62.0 in | Wt 170.0 lb

## 2022-01-14 DIAGNOSIS — N950 Postmenopausal bleeding: Secondary | ICD-10-CM | POA: Diagnosis not present

## 2022-01-14 DIAGNOSIS — R1030 Lower abdominal pain, unspecified: Secondary | ICD-10-CM | POA: Diagnosis not present

## 2022-01-14 DIAGNOSIS — Z1231 Encounter for screening mammogram for malignant neoplasm of breast: Secondary | ICD-10-CM

## 2022-01-14 LAB — POCT URINALYSIS DIP (CLINITEK)
Bilirubin, UA: NEGATIVE
Blood, UA: NEGATIVE
Glucose, UA: NEGATIVE mg/dL
Ketones, POC UA: NEGATIVE mg/dL
Leukocytes, UA: NEGATIVE
Nitrite, UA: NEGATIVE
POC PROTEIN,UA: NEGATIVE
Spec Grav, UA: <=1.005 — AB (ref 1.010–1.025)
Urobilinogen, UA: 0.2 E.U./dL
pH, UA: 6 (ref 5.0–8.0)

## 2022-01-14 NOTE — Progress Notes (Signed)
Established patient visit ? ? ?Patient: Stephanie Hess   DOB: 1951-07-24   71 y.o. Female  MRN: [id number] ?Visit Date: 01/14/2022 ? ?Chief Complaint  ?Patient presents with  ? Vaginal Bleeding  ? ?Subjective  ?  ?HPI  ?The patient is here for acute visit. Has had a few episodes of having bleeding after few times having intercourse. Has recently started seeing someone new after being single for more than 15 years. States that each time they have tried to have sex, she has noted bleeding. Most recent episode occurred this weekend. Hurt during intercourse and bleeding is still happening now. She is having pelvic pain and cramps as though she were having a period. Has been menopausal since she was 31, at the latest. She denies vaginal discharge or itching. Is just having vaginal pain and bleeding. Tried to use a tampon earlier today. States that it hurt when she inserted it and when she removed it. She denies pain with urination.  ? ?Medications: ?Outpatient Medications Prior to Visit  ?Medication Sig  ? aspirin 81 MG chewable tablet Chew by mouth.  ? aspirin EC 81 MG tablet Take 1 tablet (81 mg total) by mouth daily.  ? azithromycin (ZITHROMAX) 250 MG tablet Take by mouth.  ? busPIRone (BUSPAR) 10 MG tablet TAKE 1/2 TO 1 TABLET UP TO THREE TIMES DAILY AS NEEDED FOR ANXIETY  ? calcium-vitamin D (OSCAL WITH D) 500-200 MG-UNIT tablet Take 1 tablet by mouth.  ? formoterol (PERFOROMIST) 20 MCG/2ML nebulizer solution INHALE 2 ML (20 MCG TOTAL) BY NEBULIZATION TWO (2) TIMES A DAY.  ? ipratropium (ATROVENT) 0.02 % nebulizer solution Take 2.5 mLs (0.5 mg total) by nebulization 4 (four) times daily.  ? levalbuterol (XOPENEX HFA) 45 MCG/ACT inhaler Inhale 1 puff into the lungs every 6 (six) hours as needed for wheezing.  ? LORazepam (ATIVAN) 0.5 MG tablet Take 1/2 to 1 tablet po BID prn anxiety  ? OXYGEN Inhale into the lungs. 2 litre at night  ? predniSONE (DELTASONE) 20 MG tablet Take 20 mg by mouth 2 (two) times daily.  ?  rosuvastatin (CRESTOR) 5 MG tablet Take 5 mg by mouth daily.  ? traZODone (DESYREL) 50 MG tablet Take 0.5-1 tablets (25-50 mg total) by mouth at bedtime as needed for sleep.  ? [DISCONTINUED] Budeson-Glycopyrrol-Formoterol (BREZTRI AEROSPHERE) 160-9-4.8 MCG/ACT AERO Inhale into the lungs.  ? [DISCONTINUED] famotidine (PEPCID) 20 MG tablet Take 1 tablet (20 mg total) by mouth 2 (two) times daily.  ? [DISCONTINUED] hydrOXYzine (ATARAX/VISTARIL) 10 MG tablet Take 1 tablet (10 mg total) by mouth 3 (three) times daily as needed for anxiety. (Patient not taking: Reported on 11/07/2021)  ? [DISCONTINUED] Tiotropium Bromide Monohydrate (SPIRIVA RESPIMAT) 2.5 MCG/ACT AERS Inhale 2 puffs into the lungs daily.  ? [DISCONTINUED] TRELEGY ELLIPTA 100-62.5-25 MCG/ACT AEPB Inhale 1 puff into the lungs daily.  ? ?No facility-administered medications prior to visit.  ? ? ?Review of Systems  ?Constitutional:  Negative for activity change, appetite change, chills, fatigue and fever.  ?HENT:  Negative for congestion, postnasal drip, rhinorrhea, sinus pressure, sinus pain, sneezing and sore throat.   ?Eyes: Negative.   ?Respiratory:  Negative for cough, chest tightness, shortness of breath and wheezing.   ?Cardiovascular:  Negative for chest pain and palpitations.  ?Gastrointestinal:  Negative for abdominal pain, constipation, diarrhea, nausea and vomiting.  ?Endocrine: Negative for cold intolerance, heat intolerance, polydipsia and polyuria.  ?Genitourinary:  Positive for dyspareunia, pelvic pain and vaginal bleeding. Negative for dysuria, flank pain, frequency  and urgency.  ?Musculoskeletal:  Negative for arthralgias, back pain and myalgias.  ?Skin:  Negative for rash.  ?Allergic/Immunologic: Negative for environmental allergies.  ?Neurological:  Negative for dizziness, weakness and headaches.  ?Hematological:  Negative for adenopathy.  ?Psychiatric/Behavioral:  The patient is not nervous/anxious.   ? ? Objective  ?  ? ?Today's Vitals   ? 01/14/22 1550 01/14/22 1604  ?BP: 105/69   ?Pulse: (!) 112 (!) 109  ?Temp: 97.6 ?F (36.4 ?C)   ?SpO2: 95%   ?Weight: 170 lb (77.1 kg)   ?Height: '5\' 2"'$  (1.575 m)   ? ?Body mass index is 31.09 kg/m?.  ? ?Physical Exam ?Vitals and nursing note reviewed.  ?Constitutional:   ?   Appearance: Normal appearance. She is well-developed.  ?HENT:  ?   Head: Normocephalic and atraumatic.  ?Eyes:  ?   Pupils: Pupils are equal, round, and reactive to light.  ?Cardiovascular:  ?   Rate and Rhythm: Normal rate and regular rhythm.  ?   Pulses: Normal pulses.  ?   Heart sounds: Normal heart sounds.  ?Pulmonary:  ?   Effort: Pulmonary effort is normal.  ?   Breath sounds: Normal breath sounds.  ?Abdominal:  ?   General: Bowel sounds are normal.  ?   Palpations: Abdomen is soft.  ?   Tenderness: There is abdominal tenderness.  ?   Comments: Mild lower abdominal pain with palpation  ?Genitourinary: ?   Comments: Urine sample negative for evidence of infection or other abnormalities.  ?Musculoskeletal:     ?   General: Normal range of motion.  ?   Cervical back: Normal range of motion and neck supple.  ?Lymphadenopathy:  ?   Cervical: No cervical adenopathy.  ?Skin: ?   General: Skin is warm and dry.  ?   Capillary Refill: Capillary refill takes less than 2 seconds.  ?Neurological:  ?   General: No focal deficit present.  ?   Mental Status: She is alert and oriented to person, place, and time.  ?Psychiatric:     ?   Mood and Affect: Mood normal.     ?   Behavior: Behavior normal.     ?   Thought Content: Thought content normal.     ?   Judgment: Judgment normal.  ?  ? ?Results for orders placed or performed in visit on 01/14/22  ?POCT URINALYSIS DIP (CLINITEK)  ?Result Value Ref Range  ? Color, UA yellow yellow  ? Clarity, UA clear clear  ? Glucose, UA negative negative mg/dL  ? Bilirubin, UA negative negative  ? Ketones, POC UA negative negative mg/dL  ? Spec Grav, UA <=1.005 (A) 1.010 - 1.025  ? Blood, UA negative negative  ? pH, UA  6.0 5.0 - 8.0  ? POC PROTEIN,UA negative negative, trace  ? Urobilinogen, UA 0.2 0.2 or 1.0 E.U./dL  ? Nitrite, UA Negative Negative  ? Leukocytes, UA Negative Negative  ? ? Assessment & Plan  ?  ? ?1. Post-menopausal bleeding ?Patient has been menopausal since she was approximately 71 years old. Will get STAT transvaginal pelvic ultrasound for further evaluation. Patient does have GYN appointment scheduled for 01/28/2022.  ?- US Pelvic Complete With Transvaginal ? ?2. Lower abdominal pain ?Urine sample negative for evidence of infection or other abnormalities. Will get pelvic ultrasound for further evaluation.  ?- POCT URINALYSIS DIP (CLINITEK)  ? ?Return for prn worsening or persistent symptoms, as scheduled.  ?   ? ? ? ?Greer Ee  Junius Creamer, NP  ?[facility] ?[phone] (phone) ?[phone] (fax) ? ?[facility] ?

## 2022-01-15 DIAGNOSIS — J4520 Mild intermittent asthma, uncomplicated: Secondary | ICD-10-CM | POA: Diagnosis not present

## 2022-01-16 ENCOUNTER — Ambulatory Visit: Payer: Medicare Other | Admitting: Nurse Practitioner

## 2022-01-21 DIAGNOSIS — J849 Interstitial pulmonary disease, unspecified: Secondary | ICD-10-CM | POA: Diagnosis not present

## 2022-01-21 DIAGNOSIS — I2510 Atherosclerotic heart disease of native coronary artery without angina pectoris: Secondary | ICD-10-CM | POA: Diagnosis not present

## 2022-01-21 DIAGNOSIS — R0902 Hypoxemia: Secondary | ICD-10-CM | POA: Diagnosis not present

## 2022-01-21 DIAGNOSIS — F17210 Nicotine dependence, cigarettes, uncomplicated: Secondary | ICD-10-CM | POA: Diagnosis not present

## 2022-01-21 DIAGNOSIS — K219 Gastro-esophageal reflux disease without esophagitis: Secondary | ICD-10-CM | POA: Diagnosis not present

## 2022-01-21 DIAGNOSIS — Z7951 Long term (current) use of inhaled steroids: Secondary | ICD-10-CM | POA: Diagnosis not present

## 2022-01-21 DIAGNOSIS — R0602 Shortness of breath: Secondary | ICD-10-CM | POA: Diagnosis not present

## 2022-01-21 DIAGNOSIS — J8489 Other specified interstitial pulmonary diseases: Secondary | ICD-10-CM | POA: Diagnosis not present

## 2022-01-21 DIAGNOSIS — J441 Chronic obstructive pulmonary disease with (acute) exacerbation: Secondary | ICD-10-CM | POA: Diagnosis not present

## 2022-01-21 DIAGNOSIS — J439 Emphysema, unspecified: Secondary | ICD-10-CM | POA: Diagnosis not present

## 2022-01-21 DIAGNOSIS — M816 Localized osteoporosis [Lequesne]: Secondary | ICD-10-CM | POA: Diagnosis not present

## 2022-01-21 DIAGNOSIS — E785 Hyperlipidemia, unspecified: Secondary | ICD-10-CM | POA: Diagnosis not present

## 2022-01-21 DIAGNOSIS — Z79899 Other long term (current) drug therapy: Secondary | ICD-10-CM | POA: Diagnosis not present

## 2022-01-21 DIAGNOSIS — J45901 Unspecified asthma with (acute) exacerbation: Secondary | ICD-10-CM | POA: Diagnosis not present

## 2022-01-21 DIAGNOSIS — G4734 Idiopathic sleep related nonobstructive alveolar hypoventilation: Secondary | ICD-10-CM | POA: Diagnosis not present

## 2022-01-21 DIAGNOSIS — Z28311 Partially vaccinated for covid-19: Secondary | ICD-10-CM | POA: Diagnosis not present

## 2022-01-22 ENCOUNTER — Other Ambulatory Visit: Payer: Self-pay

## 2022-01-22 ENCOUNTER — Ambulatory Visit
Admission: RE | Admit: 2022-01-22 | Discharge: 2022-01-22 | Disposition: A | Discharge status: Home / Self Care | Payer: Medicare Other | Source: Ambulatory Visit | Attending: Nurse Practitioner | Admitting: Nurse Practitioner

## 2022-01-22 DIAGNOSIS — N950 Postmenopausal bleeding: Secondary | ICD-10-CM | POA: Insufficient documentation

## 2022-01-23 ENCOUNTER — Encounter: Payer: Self-pay | Admitting: Nurse Practitioner

## 2022-01-28 ENCOUNTER — Ambulatory Visit: Payer: Medicare Other | Admitting: Certified Nurse Midwife

## 2022-01-28 ENCOUNTER — Encounter: Payer: Self-pay | Admitting: Certified Nurse Midwife

## 2022-01-28 ENCOUNTER — Other Ambulatory Visit: Payer: Self-pay

## 2022-01-28 ENCOUNTER — Other Ambulatory Visit (HOSPITAL_COMMUNITY)
Admission: RE | Admit: 2022-01-28 | Discharge: 2022-01-28 | Disposition: A | Discharge status: Home / Self Care | Payer: Medicare Other | Source: Ambulatory Visit | Attending: Certified Nurse Midwife | Admitting: Certified Nurse Midwife

## 2022-01-28 VITALS — BP 110/69 | HR 111 | Ht 62.0 in | Wt 169.9 lb

## 2022-01-28 DIAGNOSIS — N950 Postmenopausal bleeding: Secondary | ICD-10-CM | POA: Insufficient documentation

## 2022-01-28 NOTE — Progress Notes (Signed)
GYN ENCOUNTER NOTE ? ?Subjective:  ?    ? Stephanie Hess is a 71 y.o. G2P2 female is here for gynecologic evaluation of the following issues:  ?1. Pt state she first had bleeding after having intercourse with a new partner . State she had not been sexually active for many years. She sate she did not use any lubrication. She first noticed some spotting about 3 wks ago, then 2 wks ago put in a tampon to evaluate for bleeding and noted a small amount of blood on tampon. This past Saturday used a Monistat and noticed some spotting the next day She state she had U/s done at [facility] that was ordered by her PCP.  ?  ?Gynecologic History ?No LMP recorded. Patient is postmenopausal. ?Contraception: post menopausal status ?Last Pap: 05/02/2017. Results were: negative/negative HPV ?Last mammogram: 10/26/2019. Results were: normal ? ?Obstetric History ?OB History  ?Gravida Para Term Preterm AB Living  ?2 2          ?SAB IAB Ectopic Multiple Live Births  ?           ?  ?# Outcome Date GA Lbr Len/2nd Weight Sex Delivery Anes PTL Lv  ?2 Para 1989    F CS-Unspec     ?[place]     ? ? ?Past Medical History:  ?Diagnosis Date  ? Cancer [facility])   ? COPD (chronic obstructive pulmonary disease) ([place])   ? Diverticula of colon   ? GERD (gastroesophageal reflux disease)   ? Hyperlipidemia   ? Renal insufficiency   ? 2009 or 2010  ? ? ?Past Surgical History:  ?Procedure Laterality Date  ? APPENDECTOMY    ? CESAREAN SECTION    ? ECTOPIC PREGNANCY SURGERY    ? ? ?Current Outpatient Medications on File Prior to Visit  ?Medication Sig Dispense Refill  ? aspirin 81 MG chewable tablet Chew by mouth.    ? aspirin EC 81 MG tablet Take 1 tablet (81 mg total) by mouth daily. 90 tablet 3  ? azithromycin (ZITHROMAX) 250 MG tablet Take by mouth.    ? busPIRone (BUSPAR) 10 MG tablet TAKE 1/2 TO 1 TABLET UP TO THREE TIMES DAILY AS NEEDED FOR ANXIETY 90 tablet 2  ? calcium-vitamin D (OSCAL WITH D) 500-200 MG-UNIT tablet Take 1 tablet by mouth.     ? formoterol (PERFOROMIST) 20 MCG/2ML nebulizer solution INHALE 2 ML (20 MCG TOTAL) BY NEBULIZATION TWO (2) TIMES A DAY.    ? ipratropium (ATROVENT) 0.02 % nebulizer solution Take 2.5 mLs (0.5 mg total) by nebulization 4 (four) times daily. 75 mL 3  ? levalbuterol (XOPENEX HFA) 45 MCG/ACT inhaler Inhale 1 puff into the lungs every 6 (six) hours as needed for wheezing. 1 each 3  ? LORazepam (ATIVAN) 0.5 MG tablet Take 1/2 to 1 tablet po BID prn anxiety 60 tablet 1  ? OXYGEN Inhale into the lungs. 2 litre at night    ? predniSONE (DELTASONE) 20 MG tablet Take 20 mg by mouth 2 (two) times daily.    ? rosuvastatin (CRESTOR) 5 MG tablet Take 5 mg by mouth daily.    ? traZODone (DESYREL) 50 MG tablet Take 0.5-1 tablets (25-50 mg total) by mouth at bedtime as needed for sleep. 30 tablet 3  ? ?No current facility-administered medications on file prior to visit.  ? ? ?Allergies  ?Allergen Reactions  ? Penicillins Nausea And Vomiting and Other (See Comments)  ?  Has patient  had a PCN reaction causing immediate rash, facial/tongue/throat swelling, SOB or lightheadedness with hypotension: No ?Has patient had a PCN reaction causing severe rash involving mucus membranes or skin necrosis: No ?Has patient had a PCN reaction that required hospitalization: No ?Has patient had a PCN reaction occurring within the last 10 years: Yes ?If all of the above answers are "NO", then may proceed with Cephalosporin use. ?  ? Shellfish Allergy Nausea And Vomiting  ? Sulfa Antibiotics Rash and Other (See Comments)  ?  Other reaction(s): Unknown  ? Clarithromycin Rash and Nausea Only  ?  rash  ? Amoxicillin-Pot Clavulanate Nausea And Vomiting  ? Iodinated Contrast Media Other (See Comments)  ?  acute renal insufficiency ?acute renal insufficiency  ? Atorvastatin Rash  ? Macrobid [Nitrofurantoin] Rash  ? ? ?Social History  ? ?Socioeconomic History  ? Marital status: Divorced  ?  Spouse name: Not on file  ? Number of children: Not on file  ? Years  of education: Not on file  ? Highest education level: Not on file  ?Occupational History  ? Not on file  ?Tobacco Use  ? Smoking status: Former  ?  Packs/day: 0.50  ?  Years: 50.00  ?  Pack years: 25.00  ?  Types: Cigarettes  ? Smokeless tobacco: Never  ?Vaping Use  ? Vaping Use: Never used  ?Substance and Sexual Activity  ? Alcohol use: Yes  ?  Comment: social  ? Drug use: No  ? Sexual activity: Not Currently  ?Other Topics Concern  ? Not on file  ?Social History Narrative  ? Not on file  ? ?Social Determinants of Health  ? ?Financial Resource Strain: Not on file  ?Food Insecurity: Not on file  ?Transportation Needs: Not on file  ?Physical Activity: Not on file  ?Stress: Not on file  ?Social Connections: Not on file  ?Intimate Partner Violence: Not on file  ? ? ?Family History  ?Problem Relation Age of Onset  ? Breast cancer Paternal Aunt   ? Heart attack Mother   ? ? ?The following portions of the patient's history were reviewed and updated as appropriate: allergies, current medications, past family history, past medical history, past social history, past surgical history and problem list. ? ?Review of Systems ?Review of Systems - Negative except as mentioned in HPI ?Review of Systems - General ROS: negative for - chills, fatigue, fever, hot flashes, malaise or night sweats ?Hematological and Lymphatic ROS: negative for - bleeding problems or swollen lymph nodes ?Gastrointestinal ROS: negative for - abdominal pain, blood in stools, change in bowel habits and nausea/vomiting ?Musculoskeletal ROS: negative for - joint pain, muscle pain or muscular weakness ?Genito-Urinary ROS: negative for - change in menstrual cycle, dysmenorrhea, dyspareunia, dysuria, genital discharge, genital ulcers, hematuria, incontinence, irregular/heavy menses, nocturia or pelvic painjj ? ?Objective:  ? ?BP 110/69   Pulse (!) 111   Ht '5\' 2"'$  (1.575 m)   Wt 169 lb 14.4 oz (77.1 kg)   BMI 31.08 kg/m?  ?CONSTITUTIONAL: Well-developed,  well-nourished female in no acute distress.  ?HENT:  Normocephalic, atraumatic.  ?NECK: Normal range of motion, supple, no masses.  Normal thyroid.  ?SKIN: Skin is warm and dry. No rash noted. Not diaphoretic. No erythema. No pallor. ?[place]: Alert and oriented to person, place, and time. PSYCHIATRIC: Normal mood and affect. Normal behavior. Normal judgment and thought content. ?CARDIOVASCULAR:Not Examined ?RESPIRATORY: Not Examined ?BREASTS: Not Examined ?ABDOMEN: Soft, non distended; Non tender.  No Organomegaly. ?PELVIC: ? External Genitalia: Normal ?  BUS: Normal, no abrasions seen  ? Vagina: Normal, normal atrophy.  ? Cervix: Normal,  ? Uterus: Normal size, shape,consistency, mobile ? Adnexa: Normal ? RV: Normal  ? Bladder: Nontender ?MUSCULOSKELETAL: Normal range of motion. No tenderness.  No cyanosis, clubbing, or edema. ? ? ?CLINICAL DATA:  Postmenopausal bleeding ?  ?EXAM: ?TRANSABDOMINAL AND TRANSVAGINAL ULTRASOUND OF PELVIS ?  ?DOPPLER ULTRASOUND OF OVARIES ?  ?TECHNIQUE: ?Both transabdominal and transvaginal ultrasound examinations of the ?pelvis were performed. Transabdominal technique was performed for ?global imaging of the pelvis including uterus, ovaries, adnexal ?regions, and pelvic cul-de-sac. ?  ?It was necessary to proceed with endovaginal exam following the ?transabdominal exam to visualize the endometrium and ovaries. Color ?and duplex Doppler ultrasound was utilized to evaluate blood flow to ?the ovaries. ?  ?COMPARISON:  None. ?  ?FINDINGS: ?Uterus ?  ?Measurements: 5.4 x 2.1 x 4 cm = volume: 23.2 mL. There is ?inhomogeneous echogenicity. There are few small uterine fibroids ?largest measuring 1.3 x 0.8 x 1.1 cm in the posterior body. ?  ?Endometrium ?  ?Thickness: 3 mm. Small amount of fluid is seen in the endometrial ?cavity and cervical canal. ?  ?Right ovary ?  ?Measurements: 2.2 x 0.9 x 1.1 cm = volume: 1.1 mL. Normal ?appearance/no adnexal mass. ?  ?Left ovary ?  ?Measurements: 1.6 x  0.8 x 0.8 cm = volume: 0.5 mL. Normal ?appearance/no adnexal mass. ?  ?Pulsed Doppler evaluation of both ovaries demonstrates normal ?low-resistance arterial and venous waveforms. ?  ?Other findings ?  ?No ab

## 2022-01-28 NOTE — Patient Instructions (Signed)
Atrophic Vaginitis ?Atrophic vaginitis is when the lining of the vagina becomes dry and thin. ?This is most common in women who have stopped having their periods (are in menopause). It usually starts when a woman is 49 to 70 years old. ?What are the causes? ?This condition is caused by a drop in a female hormone (estrogen). ?What increases the risk? ?You are more likely to develop this condition if: ?You take certain medicines. ?You have had your ovaries taken out. ?You are being treated for cancer. ?You have given birth or are breastfeeding. ?You are more than 70 years old. ?You smoke. ?What are the signs or symptoms? ?Pain during sex. ?A feeling of pressure during sex. ?Bleeding during sex. ?Burning or itching in the vagina. ?Burning pain when you pee (urinate). ?Fluid coming from your vagina. ?Some people do not have symptoms. ?How is this treated? ?Using a lubricant before sex. ?Using a moisturizer in the vagina. ?Using estrogen in the vagina. ?In some cases, you may not need treatment. ?Follow these instructions at home: ?Medicines ?Take all medicines only as told by your doctor. This includes medicines for dryness. ?Do not use herbal medicines unless your doctor says it is okay. ?General instructions ?Talk with your doctor about treatment. ?Do not douche. ?Do not use scented: ?Sprays. ?Tampons. ?Soaps. ?If sex hurts, try using lubricants right before you have sex. ?Contact a doctor if: ?You have fluid coming from the vagina that is not like normal. ?You have a bad smell coming from your vagina. ?You have new symptoms. ?Your symptoms do not get better when treated. ?Your symptoms get worse. ?Summary ?This condition happens when the lining of the vagina becomes dry and thin. ?It is most common in women who no longer have periods. ?Treatment may include using medicines for dryness. ?Call a doctor if your symptoms do not get better. ?This information is not intended to replace advice given to you by your health  care provider. Make sure you discuss any questions you have with your health care provider. ?Document Revised: 04/27/2020 Document Reviewed: 04/27/2020 ?Elsevier Patient Education ? [place]. ? ?

## 2022-01-30 ENCOUNTER — Ambulatory Visit
Admission: RE | Admit: 2022-01-30 | Discharge: 2022-01-30 | Disposition: A | Discharge status: Home / Self Care | Payer: Medicare Other | Source: Ambulatory Visit | Attending: Nurse Practitioner | Admitting: Nurse Practitioner

## 2022-01-30 ENCOUNTER — Other Ambulatory Visit: Payer: Self-pay

## 2022-01-30 DIAGNOSIS — Z1231 Encounter for screening mammogram for malignant neoplasm of breast: Secondary | ICD-10-CM | POA: Diagnosis not present

## 2022-01-30 LAB — CERVICOVAGINAL ANCILLARY ONLY
Bacterial Vaginitis (gardnerella): NEGATIVE
Candida Glabrata: NEGATIVE
Candida Vaginitis: NEGATIVE
Comment: NEGATIVE
Comment: NEGATIVE
Comment: NEGATIVE

## 2022-01-31 NOTE — Progress Notes (Signed)
Negative mammogram

## 2022-02-04 DIAGNOSIS — H2513 Age-related nuclear cataract, bilateral: Secondary | ICD-10-CM | POA: Diagnosis not present

## 2022-02-13 DIAGNOSIS — J441 Chronic obstructive pulmonary disease with (acute) exacerbation: Secondary | ICD-10-CM | POA: Diagnosis not present

## 2022-02-15 DIAGNOSIS — J4520 Mild intermittent asthma, uncomplicated: Secondary | ICD-10-CM | POA: Diagnosis not present

## 2022-03-02 ENCOUNTER — Other Ambulatory Visit: Payer: Self-pay | Admitting: Nurse Practitioner

## 2022-03-02 DIAGNOSIS — F411 Generalized anxiety disorder: Secondary | ICD-10-CM

## 2022-03-07 ENCOUNTER — Encounter: Payer: Self-pay | Admitting: Nurse Practitioner

## 2022-03-07 ENCOUNTER — Ambulatory Visit (INDEPENDENT_AMBULATORY_CARE_PROVIDER_SITE_OTHER): Payer: Medicare Other | Admitting: Nurse Practitioner

## 2022-03-07 VITALS — BP 116/74 | HR 96 | Temp 97.9°F | Ht 61.81 in | Wt 172.1 lb

## 2022-03-07 DIAGNOSIS — J449 Chronic obstructive pulmonary disease, unspecified: Secondary | ICD-10-CM | POA: Diagnosis not present

## 2022-03-07 DIAGNOSIS — F411 Generalized anxiety disorder: Secondary | ICD-10-CM

## 2022-03-07 DIAGNOSIS — Z6831 Body mass index (BMI) 31.0-31.9, adult: Secondary | ICD-10-CM | POA: Diagnosis not present

## 2022-03-07 DIAGNOSIS — K219 Gastro-esophageal reflux disease without esophagitis: Secondary | ICD-10-CM

## 2022-03-07 DIAGNOSIS — Z6830 Body mass index (BMI) 30.0-30.9, adult: Secondary | ICD-10-CM | POA: Insufficient documentation

## 2022-03-07 NOTE — Progress Notes (Signed)
Established patient visit ? ? ?Patient: Stephanie Hess   DOB: August 30, 1951   71 y.o. Female  MRN: [id number] ?Visit Date: 03/07/2022 ? ? ?Chief Complaint  ?Patient presents with  ? Medication Refill  ? ?Subjective  ?  ?HPI  ?Routine follow up visit.  ?-history of COPD. Had been doing better at most recent visit. Does see pulmonology provider [facility] ?-GERD ?-anxiety with insomnia - taking trazodone to help with sleep. States that this has been helping.  ?-had episode of post menopausal bleeding. This was mostly superficial bleeding. Referred to GYN. Diagnosed with atrophic vaginitis. Has not had problems with this since cream prescribed  ?-states that she has started smoking again  ?-no new concerns or complaints today.  ?-,denies chest pain, chest pressure, or shortness of breath. She denies headaches or visual disturbances. Sh ?e denies abdominal pain, nausea, vomiting, or changes in bowel or bladder habits.   ? ? ?Medications: ?Outpatient Medications Prior to Visit  ?Medication Sig  ? aspirin 81 MG chewable tablet Chew by mouth.  ? aspirin EC 81 MG tablet Take 1 tablet (81 mg total) by mouth daily.  ? busPIRone (BUSPAR) 10 MG tablet TAKE 1/2 TO 1 TABLET UP TO THREE TIMES DAILY AS NEEDED FOR ANXIETY  ? calcium-vitamin D (OSCAL WITH D) 500-200 MG-UNIT tablet Take 1 tablet by mouth.  ? formoterol (PERFOROMIST) 20 MCG/2ML nebulizer solution INHALE 2 ML (20 MCG TOTAL) BY NEBULIZATION TWO (2) TIMES A DAY.  ? ipratropium (ATROVENT) 0.02 % nebulizer solution Take 2.5 mLs (0.5 mg total) by nebulization 4 (four) times daily.  ? LORazepam (ATIVAN) 0.5 MG tablet Take 1/2 to 1 tablet po BID prn anxiety  ? OXYGEN Inhale into the lungs. 2 litre at night  ? predniSONE (DELTASONE) 20 MG tablet Take 20 mg by mouth 2 (two) times daily.  ? rosuvastatin (CRESTOR) 5 MG tablet Take 5 mg by mouth daily.  ? traZODone (DESYREL) 50 MG tablet TAKE 0.5-1 TABLETS BY MOUTH AT BEDTIME AS NEEDED FOR SLEEP.  ? [DISCONTINUED] azithromycin (ZITHROMAX)  250 MG tablet Take by mouth.  ? [DISCONTINUED] levalbuterol (XOPENEX HFA) 45 MCG/ACT inhaler Inhale 1 puff into the lungs every 6 (six) hours as needed for wheezing.  ? ?No facility-administered medications prior to visit.  ? ? ?Review of Systems  ?Constitutional:  Negative for activity change, appetite change, chills, fatigue and fever.  ?HENT:  Negative for congestion, postnasal drip, rhinorrhea, sinus pressure, sinus pain, sneezing and sore throat.   ?Eyes: Negative.   ?Respiratory:  Positive for cough and wheezing. Negative for chest tightness and shortness of breath.   ?Cardiovascular:  Negative for chest pain and palpitations.  ?Gastrointestinal:  Negative for abdominal pain, constipation, diarrhea, nausea and vomiting.  ?     GERD with heartburn   ?Endocrine: Negative for cold intolerance, heat intolerance, polydipsia and polyuria.  ?Genitourinary:  Negative for dyspareunia, dysuria, flank pain, frequency and urgency.  ?Musculoskeletal:  Negative for arthralgias, back pain and myalgias.  ?Skin:  Negative for rash.  ?Allergic/Immunologic: Negative for environmental allergies.  ?Neurological:  Negative for dizziness, weakness and headaches.  ?Hematological:  Negative for adenopathy.  ?Psychiatric/Behavioral:  Positive for sleep disturbance. The patient is nervous/anxious.   ? ? ? Objective  ?  ? ?Today's Vitals  ? 03/07/22 1047  ?BP: 116/74  ?Pulse: 96  ?Temp: 97.9 ?F (36.6 ?C)  ?SpO2: 94%  ?Weight: 172 lb 1.9 oz (78.1 kg)  ?Height: 5' 1.81" (1.57 m)  ? ?Body mass index is  31.67 kg/m?.  ? ?BP Readings from Last 3 Encounters:  ?03/07/22 116/74  ?01/28/22 110/69  ?01/14/22 105/69  ?  ?Wt Readings from Last 3 Encounters:  ?03/07/22 172 lb 1.9 oz (78.1 kg)  ?01/28/22 169 lb 14.4 oz (77.1 kg)  ?01/14/22 170 lb (77.1 kg)  ?  ?Physical Exam ?Vitals and nursing note reviewed.  ?Constitutional:   ?   Appearance: Normal appearance. She is well-developed.  ?HENT:  ?   Head: Normocephalic and atraumatic.  ?   Nose: Nose  normal.  ?   Mouth/Throat:  ?   Mouth: Mucous membranes are moist.  ?   Pharynx: Oropharynx is clear.  ?Eyes:  ?   Extraocular Movements: Extraocular movements intact.  ?   Conjunctiva/sclera: Conjunctivae normal.  ?   Pupils: Pupils are equal, round, and reactive to light.  ?Cardiovascular:  ?   Rate and Rhythm: Normal rate and regular rhythm.  ?   Pulses: Normal pulses.  ?   Heart sounds: Normal heart sounds.  ?Pulmonary:  ?   Effort: Pulmonary effort is normal.  ?   Breath sounds: Normal breath sounds.  ?   Comments: Expiratory wheezes heard throughout the lung fields  ?Abdominal:  ?   Palpations: Abdomen is soft.  ?Musculoskeletal:     ?   General: Normal range of motion.  ?   Cervical back: Normal range of motion and neck supple.  ?Lymphadenopathy:  ?   Cervical: No cervical adenopathy.  ?Skin: ?   General: Skin is warm and dry.  ?   Capillary Refill: Capillary refill takes less than 2 seconds.  ?Neurological:  ?   General: No focal deficit present.  ?   Mental Status: She is alert and oriented to person, place, and time.  ?Psychiatric:     ?   Mood and Affect: Mood normal.     ?   Behavior: Behavior normal.     ?   Thought Content: Thought content normal.     ?   Judgment: Judgment normal.  ?  ? ? Assessment & Plan  ?  ?1. Chronic obstructive pulmonary disease, unspecified COPD type ([place]) ?Stable.  Continue regular visits with pulmonology as scheduled.  Use inhalers and respiratory medications as prescribed. ? ?2. Gastroesophageal reflux disease without esophagitis ?Stable.  Continue medications to treat acid reflux as previously prescribed. ? ?3. Generalized anxiety disorder ?Improved.  Patient continues to take trazodone at night when needed for anxiety which causes insomnia. ? ?4. Body mass index (BMI) of 31.0-31.9 in adult ?Discussed lowering calorie intake to 1500 calories per day and incorporating exercise into daily routine to help lose weight.  ? ?  ?Problem List Items Addressed This Visit   ? ?  ?  Respiratory  ? Chronic obstructive pulmonary disease (HCC) - Primary  ?  ? Digestive  ? Gastroesophageal reflux disease without esophagitis  ?  ? Other  ? Generalized anxiety disorder  ? Body mass index (BMI) of 31.0-31.9 in adult  ?  ? ?Return in about 4 months (around 07/07/2022) for mood, insomnia .  ?   ? ? ? ? ?Ronnell Freshwater, NP  ?[facility] ?[phone] (phone) ?[phone] (fax) ? ?[facility]  ?

## 2022-03-15 ENCOUNTER — Other Ambulatory Visit: Payer: Self-pay | Admitting: Nurse Practitioner

## 2022-03-15 DIAGNOSIS — J441 Chronic obstructive pulmonary disease with (acute) exacerbation: Secondary | ICD-10-CM | POA: Diagnosis not present

## 2022-03-17 DIAGNOSIS — J4520 Mild intermittent asthma, uncomplicated: Secondary | ICD-10-CM | POA: Diagnosis not present

## 2022-04-09 NOTE — Progress Notes (Signed)
This was discussed with patient during visit 02/2022. She doessee GYN provider

## 2022-04-15 DIAGNOSIS — J441 Chronic obstructive pulmonary disease with (acute) exacerbation: Secondary | ICD-10-CM | POA: Diagnosis not present

## 2022-04-17 DIAGNOSIS — J4520 Mild intermittent asthma, uncomplicated: Secondary | ICD-10-CM | POA: Diagnosis not present

## 2022-04-22 ENCOUNTER — Other Ambulatory Visit: Payer: Self-pay | Admitting: Nurse Practitioner

## 2022-04-22 DIAGNOSIS — J441 Chronic obstructive pulmonary disease with (acute) exacerbation: Secondary | ICD-10-CM

## 2022-05-02 ENCOUNTER — Other Ambulatory Visit: Payer: Self-pay | Admitting: Nurse Practitioner

## 2022-05-02 DIAGNOSIS — F411 Generalized anxiety disorder: Secondary | ICD-10-CM

## 2022-05-04 ENCOUNTER — Ambulatory Visit (INDEPENDENT_AMBULATORY_CARE_PROVIDER_SITE_OTHER): Payer: Medicare Other

## 2022-05-04 ENCOUNTER — Ambulatory Visit
Admission: EM | Admit: 2022-05-04 | Discharge: 2022-05-04 | Disposition: A | Discharge status: Home / Self Care | Payer: Medicare Other | Attending: Urgent Care | Admitting: Urgent Care

## 2022-05-04 DIAGNOSIS — R059 Cough, unspecified: Secondary | ICD-10-CM

## 2022-05-04 DIAGNOSIS — R042 Hemoptysis: Secondary | ICD-10-CM | POA: Diagnosis not present

## 2022-05-04 DIAGNOSIS — J189 Pneumonia, unspecified organism: Secondary | ICD-10-CM | POA: Diagnosis not present

## 2022-05-04 MED ORDER — LEVOFLOXACIN 750 MG PO TABS: 750.0000 mg | ORAL_TABLET | Freq: Every day | ORAL | 0 refills | Status: AC

## 2022-05-04 NOTE — ED Triage Notes (Signed)
Pt present coughing with SOB and headache. Symptoms started on Wednesday. Pt is coughing up blood.

## 2022-05-13 ENCOUNTER — Ambulatory Visit
Admission: EM | Admit: 2022-05-13 | Discharge: 2022-05-13 | Disposition: A | Discharge status: Home / Self Care | Payer: Medicare Other | Attending: Emergency Medicine | Admitting: Emergency Medicine

## 2022-05-13 ENCOUNTER — Ambulatory Visit (INDEPENDENT_AMBULATORY_CARE_PROVIDER_SITE_OTHER): Payer: Medicare Other

## 2022-05-13 DIAGNOSIS — J189 Pneumonia, unspecified organism: Secondary | ICD-10-CM

## 2022-05-13 DIAGNOSIS — N644 Mastodynia: Secondary | ICD-10-CM | POA: Diagnosis not present

## 2022-05-13 LAB — D-DIMER, QUANTITATIVE: D-Dimer, Quant: 1.26 ug/mL-FEU — ABNORMAL HIGH (ref 0.00–0.50)

## 2022-05-13 MED ORDER — IBUPROFEN 600 MG PO TABS: 600.0000 mg | ORAL_TABLET | Freq: Four times a day (QID) | ORAL | 0 refills | Status: AC | PRN

## 2022-05-13 MED ORDER — AEROCHAMBER MV MISC: 1 refills | Status: DC

## 2022-05-13 NOTE — ED Notes (Signed)
Patient refused to schedule a PCP here in the office and stated that she will call [facility]

## 2022-05-13 NOTE — ED Triage Notes (Signed)
Patient presents to UC -- Rash that is on her legs, breast and between her breast. Started last week -- very sore and itchy.   thinks she might have shingles.

## 2022-05-13 NOTE — Discharge Instructions (Addendum)
2 puffs from your levo albuterol inhaler using your spacer every 4-6 hours.  Take 1000 mg of Tylenol with 600 mg of ibuprofen up to 3-4 times a day as needed for pain.  I will contact you if and only if your D-dimer comes back positive.  If it comes back positive, then you will need to go to the emergency room to rule out a blood clot in your lung.  Please let your pulmonologist know about your symptoms and that you were only treated for pneumonia.  You will need a repeat x-ray in 3 to 4 weeks to make sure that your pneumonia has completely resolved.

## 2022-05-13 NOTE — ED Provider Notes (Signed)
HPI  SUBJECTIVE:  Stephanie Hess is a 71 y.o. female who presents with sharp, sore, pins-and-needles like, constant, pleuritic right breast pain that goes around to her back starting yesterday.  She reports sensitivity along the area.  She states that she has had an erythematous rash in this area starting 6 days ago.  Denies burning pain.  She states this is similar to previous shingles.  She has not tried anything for her symptoms.  No alleviating factors.  Symptoms worse with palpation, deep inspiration.  It is not associated with torso rotation, arm movement.  She was seen here on 6/24, was found to have an defined nodular density in the right mid lung with an infiltrate.  She was found to have a right middle lobe pneumonia and sent home on 7 days of Levaquin.  She finished this 2 days ago.  She states that the cough is resolving, the wheezing and shortness of breath is getting better.  She denies calf pain, swelling, recent immobilization, hemoptysis, surgery in the past 4 weeks.  She has a past medical history of shingles, COPD/pneumonia, renal insufficiency per chart review.  No history of diabetes, hypertension, PE, DVT.  PCP: In [facility] pulmonology: At [facility]  Past Medical History:  Diagnosis Date   Cancer [facility])    COPD (chronic obstructive pulmonary disease) ([place])    Diverticula of colon    GERD (gastroesophageal reflux disease)    Hyperlipidemia    Renal insufficiency    2009 or 2010    Past Surgical History:  Procedure Laterality Date   APPENDECTOMY     CESAREAN SECTION     ECTOPIC PREGNANCY SURGERY      Family History  Problem Relation Age of Onset   Breast cancer Paternal Aunt    Heart attack Mother     Social History   Tobacco Use   Smoking status: Former    Packs/day: 0.50    Years: 50.00    Total pack years: 25.00    Types: Cigarettes   Smokeless tobacco: Never  Vaping Use   Vaping Use: Never used  Substance Use Topics   Alcohol use: Yes    Comment:  social   Drug use: No    No current facility-administered medications for this encounter.  Current Outpatient Medications:    aspirin 81 MG chewable tablet, Chew by mouth., Disp: , Rfl:    aspirin EC 81 MG tablet, Take 1 tablet (81 mg total) by mouth daily., Disp: 90 tablet, Rfl: 3   busPIRone (BUSPAR) 10 MG tablet, TAKE 1/2 TO 1 TABLET UP TO THREE TIMES DAILY AS NEEDED FOR ANXIETY, Disp: 90 tablet, Rfl: 2   calcium-vitamin D (OSCAL WITH D) 500-200 MG-UNIT tablet, Take 1 tablet by mouth., Disp: , Rfl:    formoterol (PERFOROMIST) 20 MCG/2ML nebulizer solution, INHALE 2 ML (20 MCG TOTAL) BY NEBULIZATION TWO (2) TIMES A DAY., Disp: , Rfl:    ibuprofen (ADVIL) 600 MG tablet, Take 1 tablet (600 mg total) by mouth every 6 (six) hours as needed for up to 5 days., Disp: 20 tablet, Rfl: 0   ipratropium (ATROVENT) 0.02 % nebulizer solution, Take 2.5 mLs (0.5 mg total) by nebulization 4 (four) times daily., Disp: 75 mL, Rfl: 3   levalbuterol (XOPENEX HFA) 45 MCG/ACT inhaler, INHALE 1 PUFF INTO THE LUNGS EVERY 6 HOURS AS NEEDED FOR WHEEZING., Disp: 30 each, Rfl: 2   LORazepam (ATIVAN) 0.5 MG tablet, TAKE 1/2 TO 1 TABLET TWICE DAILY AS NEEDED FOR ANXIETY,  Disp: 60 tablet, Rfl: 0   OXYGEN, Inhale into the lungs. 2 litre at night, Disp: , Rfl:    rosuvastatin (CRESTOR) 5 MG tablet, Take 5 mg by mouth daily., Disp: , Rfl:    Spacer/Aero-Holding Chambers (AEROCHAMBER MV) inhaler, Use as instructed, Disp: 1 each, Rfl: 1   traZODone (DESYREL) 50 MG tablet, TAKE 0.5-1 TABLETS BY MOUTH AT BEDTIME AS NEEDED FOR SLEEP., Disp: 30 tablet, Rfl: 3  Allergies  Allergen Reactions   Penicillins Nausea And Vomiting and Other (See Comments)    Has patient had a PCN reaction causing immediate rash, facial/tongue/throat swelling, SOB or lightheadedness with hypotension: No Has patient had a PCN reaction causing severe rash involving mucus membranes or skin necrosis: No Has patient had a PCN reaction that required  hospitalization: No Has patient had a PCN reaction occurring within the last 10 years: Yes If all of the above answers are "NO", then may proceed with Cephalosporin use.    Shellfish Allergy Nausea And Vomiting   Sulfa Antibiotics Rash and Other (See Comments)    Other reaction(s): Unknown   Clarithromycin Rash and Nausea Only    rash   Amoxicillin-Pot Clavulanate Nausea And Vomiting   Iodinated Contrast Media Other (See Comments)    acute renal insufficiency acute renal insufficiency   Atorvastatin Rash   Macrobid [Nitrofurantoin] Rash     ROS  As noted in HPI.   Physical Exam  BP 110/75 (BP Location: Left Arm)   Pulse (!) 103   Temp 98.1 F (36.7 C) (Oral)   Resp 14   Ht '5\' 2"'$  (1.575 m)   Wt 77.1 kg   SpO2 96%   BMI 31.09 kg/m   Constitutional: Well developed, well nourished, no acute distress. . Eyes:  EOMI, conjunctiva normal bilaterally HENT: Normocephalic, atraumatic,mucus membranes moist Respiratory: Normal inspiratory effort.  Decreased breath sounds, crackles at the base of the right lung Cardiovascular: Regular tachycardia GI: nondistended skin: A few erythematous papules over right breast and at the sternum.  No grouped vesicular rash.  Tenderness along the right breast into the axilla in the T5 distribution.  No appreciable breast masses. Musculoskeletal: Calves symmetric, nontender, no edema Neurologic: Alert & oriented x 3, no focal neuro deficits Psychiatric: Speech and behavior appropriate   ED Course   Medications - No data to display  Orders Placed This Encounter  Procedures   DG Chest 2 View    Standing Status:   Standing    Number of Occurrences:   1    Order Specific Question:   Reason for Exam (SYMPTOM  OR DIAGNOSIS REQUIRED)    Answer:   Right-sided chest/breast pain.  Recently treated for pneumonia.  Rule out worsening pneumonia   D-dimer, quantitative    Standing Status:   Standing    Number of Occurrences:   1   Nursing  Communication Please set up with a local PCP prior to discharge    Please set up with a local PCP prior to discharge    Standing Status:   Standing    Number of Occurrences:   1   Nursing Communication    Standing Status:   Standing    Number of Occurrences:   1    No results found for this or any previous visit (from the past 24 hour(s)). DG Chest 2 View  Result Date: 05/13/2022 CLINICAL DATA:  Concern for worsening pneumonia EXAM: CHEST - 2 VIEW COMPARISON:  Chest x-ray dated May 04, 2022 FINDINGS: The  heart size and mediastinal contours are within normal limits. Interval decreased size of right middle lobe consolidation. Previously described nodular density of the right middle lobe is less conspicuous on today's exam. Mild left basilar opacity, likely due to atelectasis. The visualized skeletal structures are unremarkable. IMPRESSION: Interval decreased size of right middle lobe consolidation, likely due to resolving infection. Recommend additional follow-up PA and lateral chest x-ray in 3-4 weeks to ensure complete resolution. Electronically Signed   By: Yetta Glassman M.D.   On: 05/13/2022 14:16    Results for orders placed or performed during the hospital encounter of 05/13/22  D-dimer, quantitative  Result Value Ref Range   D-Dimer, Quant 1.26 (H) 0.00 - 0.50 ug/mL-FEU    ED Clinical Impression  1. Breast pain, right   2. Community acquired pneumonia of right middle lobe of lung      ED Assessment/Plan  I am unable to appreciate anything that looks like shingles, although she does have tenderness in a dermatomal distribution.  Checking chest x-ray as she states that the pain gets worse with deep inspiration to rule out pneumonia.  Pleurisy, PE in the differential.  She is tachycardic here to today, but was tachycardic on her last visit.  Offered DuoNeb/plain albuterol.  Patient states albuterol does not work for her.  There are no spacers available here today.  Reviewed  imaging independently.  Interval decrease size of right middle lobe consolidation likely due to resolving infection.  Repeat chest x-ray in 3 to 4 weeks to ensure complete resolution advised.  See radiology report for details.  I suspect that this is pleurisy from her resolving pneumonia.  However, because of tachycardia and the pleuritic chest pain, I am concerned that she could have a PE, so we will check a D-dimer. Wells score for PE 1.5, low risk.  Advised patient that if it comes back positive, she will need to go to the ED for to rule out PE.  It does not appear to be a cellulitis of the breast.  I am unable to appreciate any breast masses.  In the meantime, Tylenol/ibuprofen 3 times a day, 2 puffs from her levoalbuterol inhaler using her spacer every 6 hours.  She is to call her pulmonologist at [facility] for follow-up.  We will also arrange primary care follow-up prior to her leaving.  05/14/22- D-dimer positive at 1.26. It is still elevated at 0.69 when adjusted for age. Called patient, and she states that the chest pain is gone after some ibuprofen. She is feeling much better today. Shortness of breath at rest and DOE have improved. Discussed that there are other causes of elevated d-dimer, such as recent infection, however, the safest course of action would be to go to the ED for further evaluation.  Patient states that she will go to the [facility] emergency department.  Discussed labs, imaging, MDM, treatment plan, and plan for follow-up with patient. Discussed sn/sx that should prompt return to the ED. patient agrees with plan.   Meds ordered this encounter  Medications   Spacer/Aero-Holding Chambers (AEROCHAMBER MV) inhaler    Sig: Use as instructed    Dispense:  1 each    Refill:  1   ibuprofen (ADVIL) 600 MG tablet    Sig: Take 1 tablet (600 mg total) by mouth every 6 (six) hours as needed for up to 5 days.    Dispense:  20 tablet    Refill:  0     *This clinic note was  created using  [employer] Therefore, there may be occasional mistakes despite careful proofreading.  ?    Melynda Ripple, MD 05/14/22 1730

## 2022-05-14 DIAGNOSIS — R791 Abnormal coagulation profile: Secondary | ICD-10-CM | POA: Diagnosis not present

## 2022-05-14 DIAGNOSIS — Z880 Allergy status to penicillin: Secondary | ICD-10-CM | POA: Diagnosis not present

## 2022-05-14 DIAGNOSIS — Z87891 Personal history of nicotine dependence: Secondary | ICD-10-CM | POA: Diagnosis not present

## 2022-05-14 DIAGNOSIS — Z7982 Long term (current) use of aspirin: Secondary | ICD-10-CM | POA: Diagnosis not present

## 2022-05-14 DIAGNOSIS — E785 Hyperlipidemia, unspecified: Secondary | ICD-10-CM | POA: Diagnosis not present

## 2022-05-14 DIAGNOSIS — R000 Tachycardia, unspecified: Secondary | ICD-10-CM | POA: Diagnosis not present

## 2022-05-14 DIAGNOSIS — J449 Chronic obstructive pulmonary disease, unspecified: Secondary | ICD-10-CM | POA: Diagnosis not present

## 2022-05-14 DIAGNOSIS — K760 Fatty (change of) liver, not elsewhere classified: Secondary | ICD-10-CM | POA: Diagnosis not present

## 2022-05-14 DIAGNOSIS — R9431 Abnormal electrocardiogram [ECG] [EKG]: Secondary | ICD-10-CM | POA: Diagnosis not present

## 2022-05-14 DIAGNOSIS — I2510 Atherosclerotic heart disease of native coronary artery without angina pectoris: Secondary | ICD-10-CM | POA: Diagnosis not present

## 2022-05-14 DIAGNOSIS — K449 Diaphragmatic hernia without obstruction or gangrene: Secondary | ICD-10-CM | POA: Diagnosis not present

## 2022-05-14 DIAGNOSIS — Z79899 Other long term (current) drug therapy: Secondary | ICD-10-CM | POA: Diagnosis not present

## 2022-05-14 DIAGNOSIS — R079 Chest pain, unspecified: Secondary | ICD-10-CM | POA: Diagnosis not present

## 2022-05-14 DIAGNOSIS — K219 Gastro-esophageal reflux disease without esophagitis: Secondary | ICD-10-CM | POA: Diagnosis not present

## 2022-05-14 DIAGNOSIS — R7989 Other specified abnormal findings of blood chemistry: Secondary | ICD-10-CM | POA: Diagnosis not present

## 2022-05-15 DIAGNOSIS — J441 Chronic obstructive pulmonary disease with (acute) exacerbation: Secondary | ICD-10-CM | POA: Diagnosis not present

## 2022-05-17 DIAGNOSIS — J4520 Mild intermittent asthma, uncomplicated: Secondary | ICD-10-CM | POA: Diagnosis not present

## 2022-05-18 DIAGNOSIS — R7989 Other specified abnormal findings of blood chemistry: Secondary | ICD-10-CM | POA: Diagnosis not present

## 2022-05-30 IMAGING — CT CT NECK W/O CM
4 series · 14 of 33 positions shown, 17 images · non-contrast
Comparison: None.

CLINICAL DATA: Neck abscess, deep tissue. Shortness of breath.
Difficulty swallowing and globus sensation.

EXAM:
CT NECK WITHOUT CONTRAST
TECHNIQUE: Multidetector CT imaging of the neck was performed following the
standard protocol without intravenous contrast.

[Series 2: axial neck · axial · 0.60mm/px · z∈[+218,+360]mm · 5 of 107 slices shown, 7 images]
[im 18/107  soft-tissue]
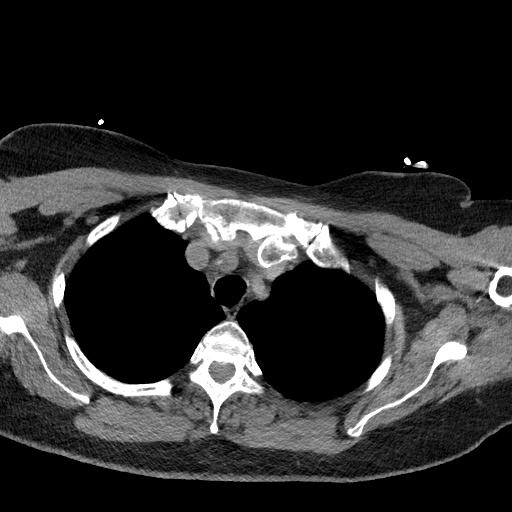
[im 18/107  bone]
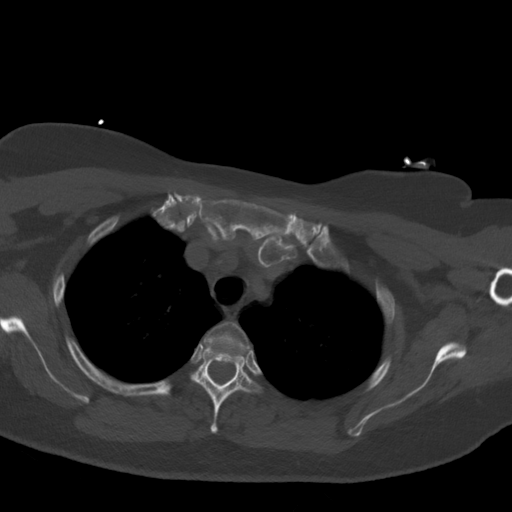
[im 36/107  bone]
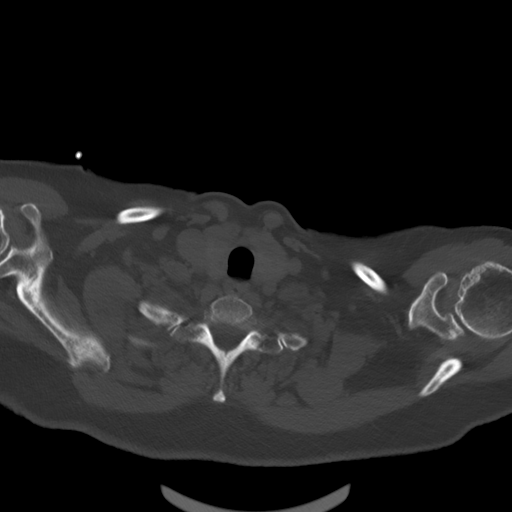
[im 54/107  bone]
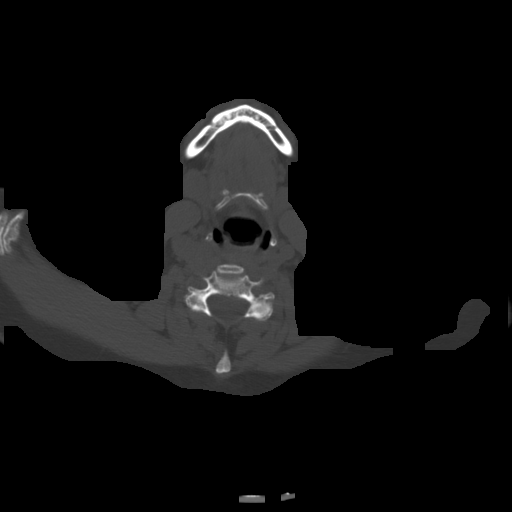
[im 71/107  bone]
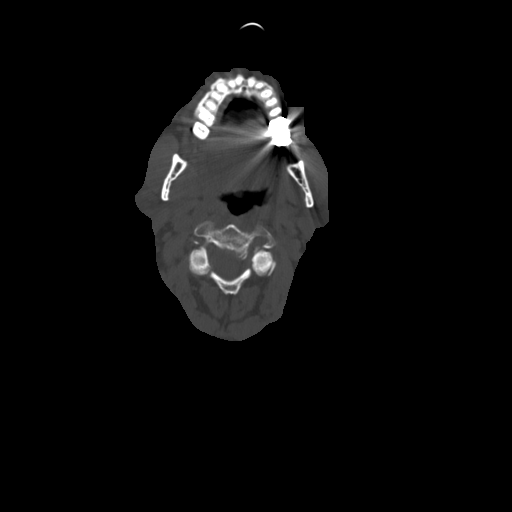
[im 89/107  soft-tissue]
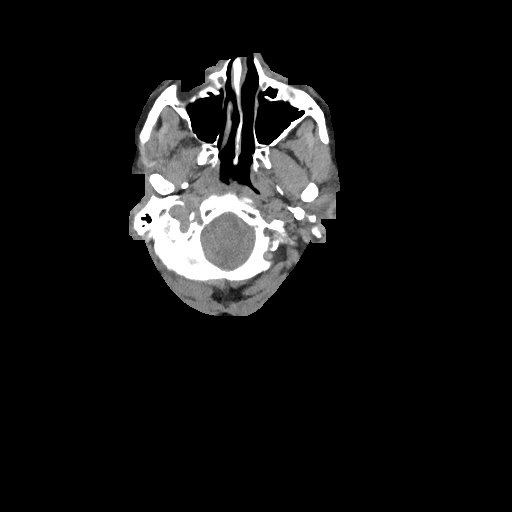
[im 89/107  bone]
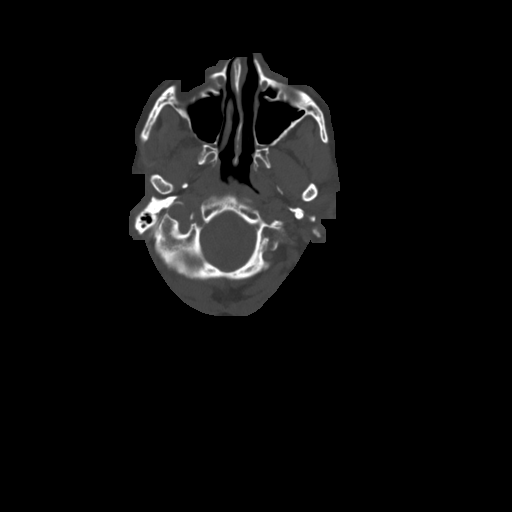

[Series 5: sag neck · sagittal · 0.44mm/px · 5 of 106 slices shown, 6 images]
[im 36/106  bone]
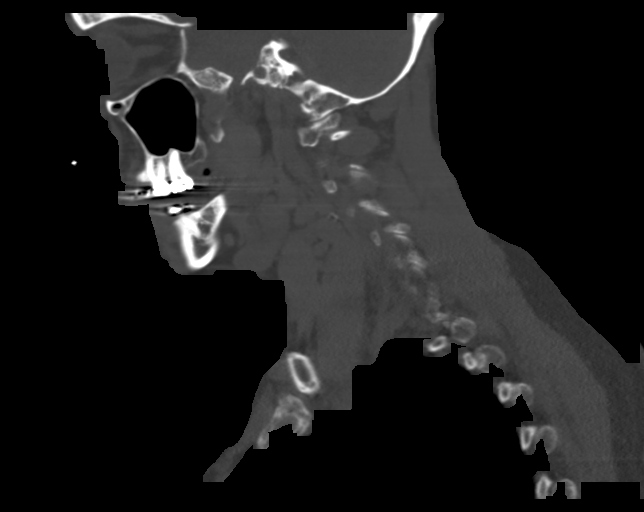
[im 44/106  bone]
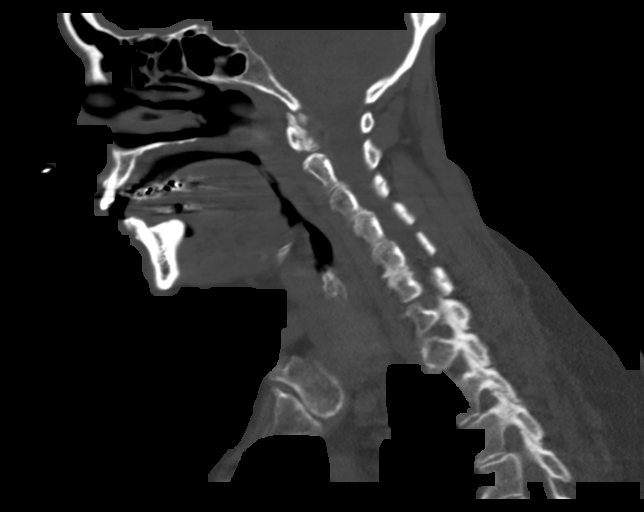
[im 53/106  soft-tissue]
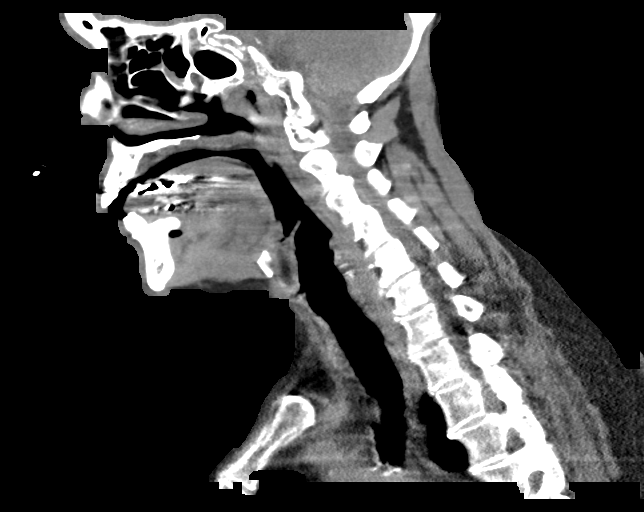
[im 53/106  bone]
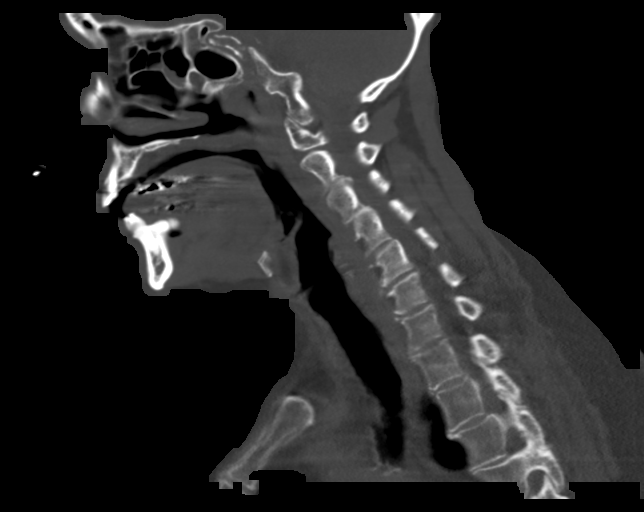
[im 62/106  bone]
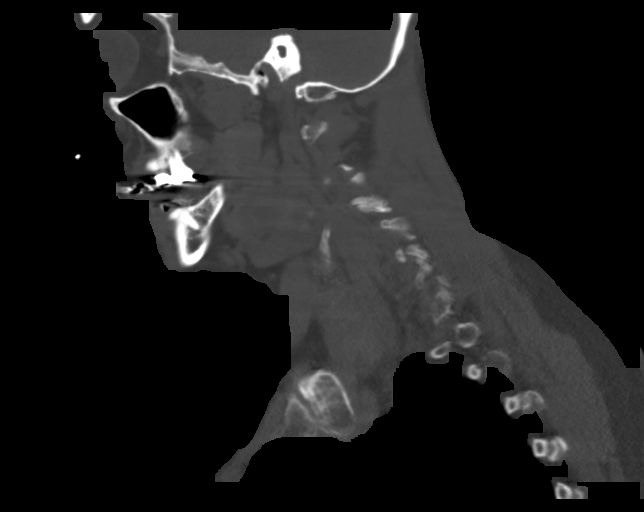
[im 71/106  bone]
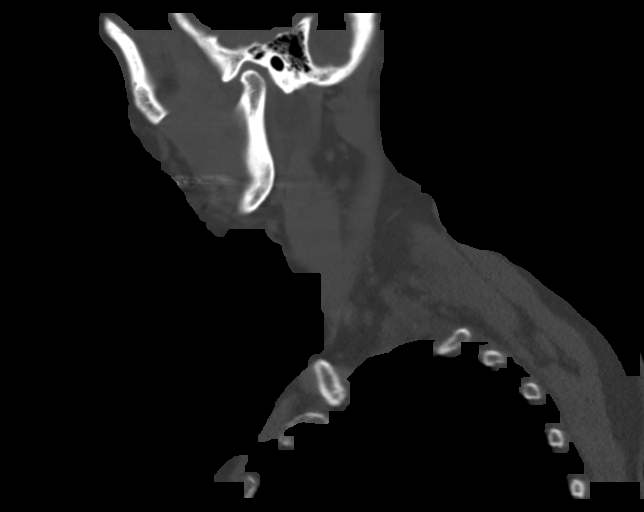

[Series 6: cor neck · coronal · 0.42mm/px · 3 of 135 slices shown]
[im 27/135  bone]
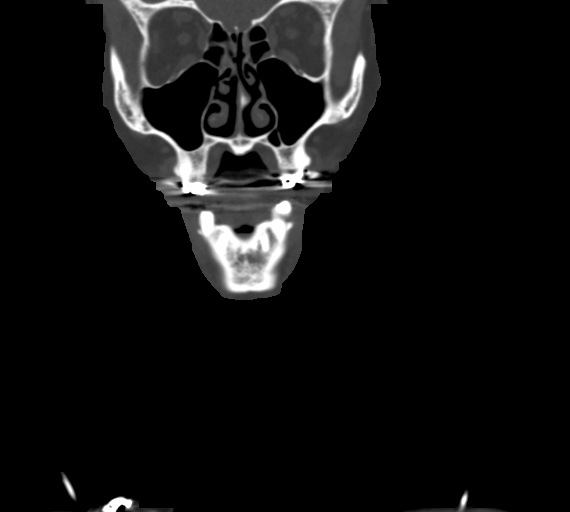
[im 54/135  bone]
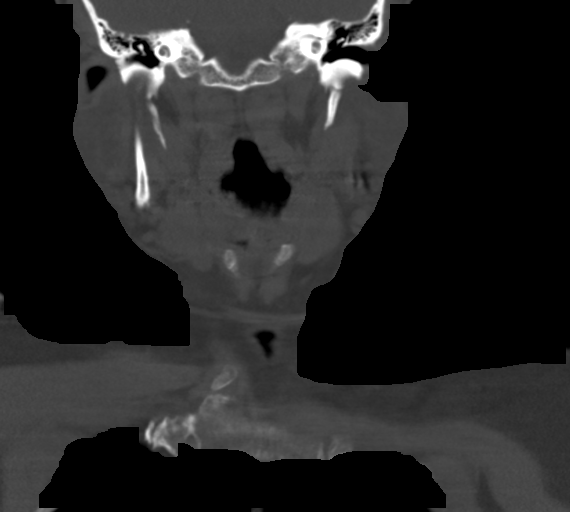
[im 81/135  bone]
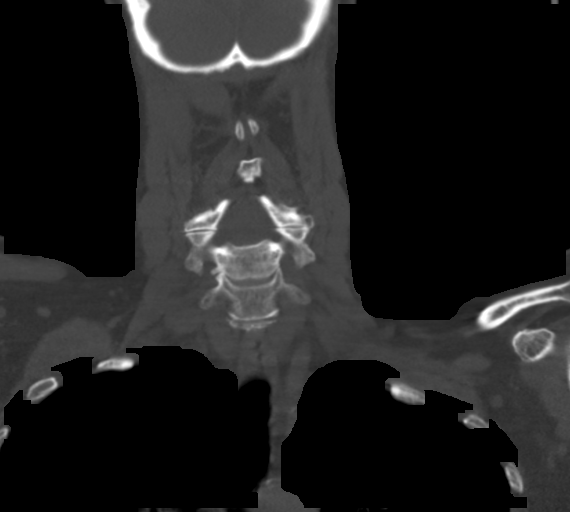

[Series 7: orthogonal ax · axial · 0.39mm/px · 1 of 106 slices shown]
[im 18/106  bone]
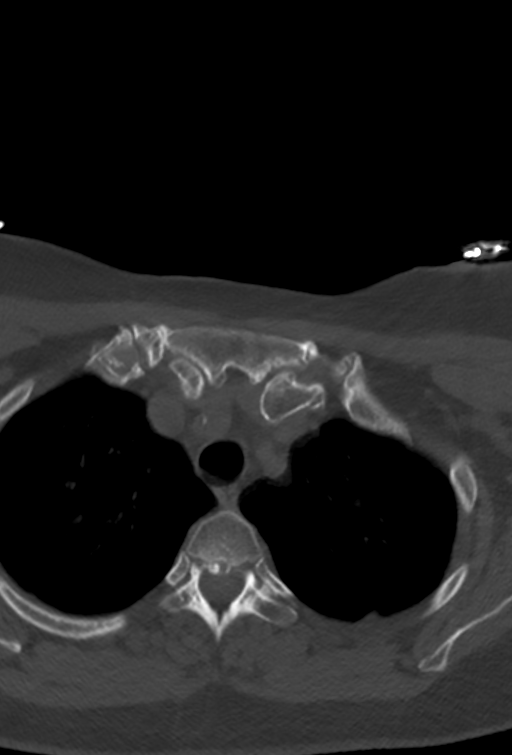

[14 of 33 positions shown; findings below may reference images not displayed]

FINDINGS: Pharynx and larynx: Mild motion artifact. No evidence of mass or
swelling. No fluid collection or significant inflammatory changes in
the parapharyngeal or retropharyngeal spaces.

Salivary glands: No inflammation, mass, or stone.

Thyroid: Unremarkable.

Lymph nodes: No enlarged or suspicious lymph nodes in the neck.

Vascular: Mild calcific atherosclerosis at the carotid bifurcations.

Limited intracranial: Unremarkable.

Visualized orbits: Unremarkable.

Mastoids and visualized paranasal sinuses: Scattered mild mucosal
thickening in the ethmoid air cells bilaterally. Clear mastoid air
cells.

Skeleton: Mild disc and moderate facet degeneration in the cervical
and upper thoracic spine.

Upper chest: Emphysema.

Other: None.
IMPRESSION: 1. No acute abnormality identified in the neck.
2. Emphysema ([id number]).

## 2022-05-30 IMAGING — DX DG CHEST 1V PORT
1 series · 2 of 2 positions shown · non-contrast
Comparison: 04/05/2021

CLINICAL DATA: Shortness of breath, COPD

EXAM:
PORTABLE CHEST 1 VIEW

[Series 1: chest ap · 0.14mm/px · 2 of 2 slices shown]
[im 1/2]
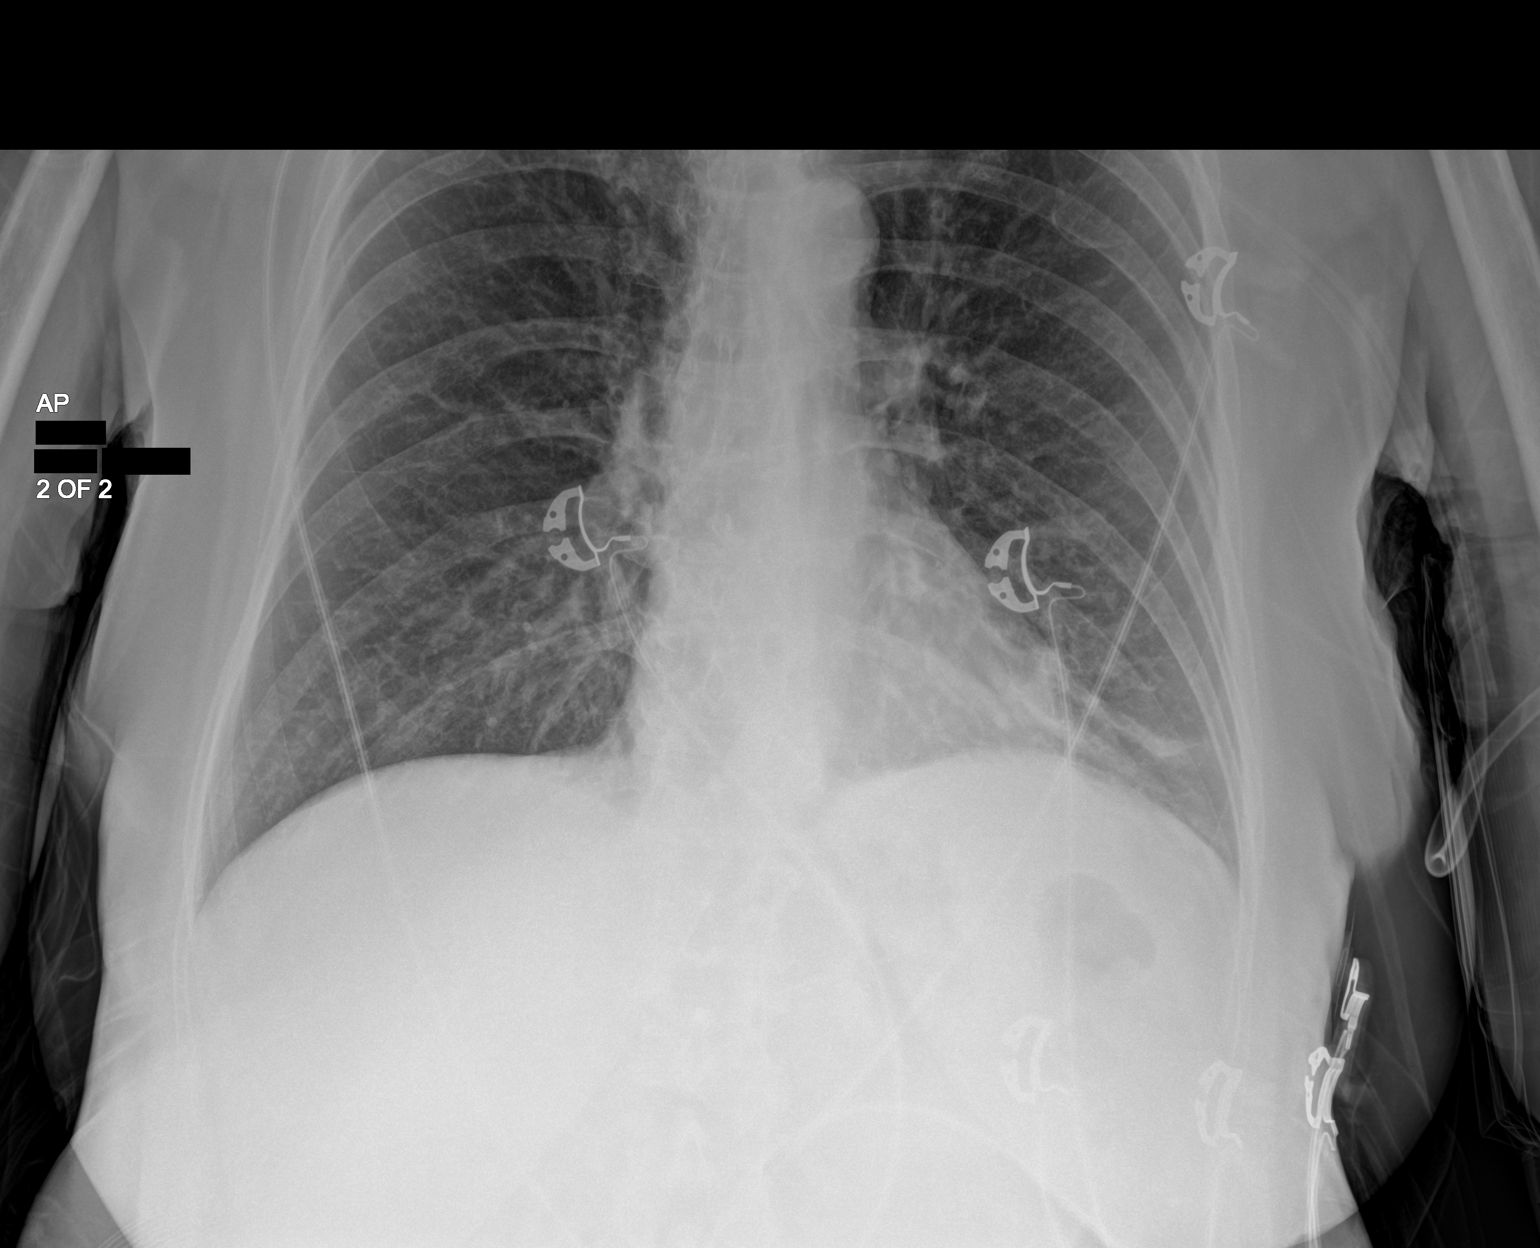
[im 2/2]
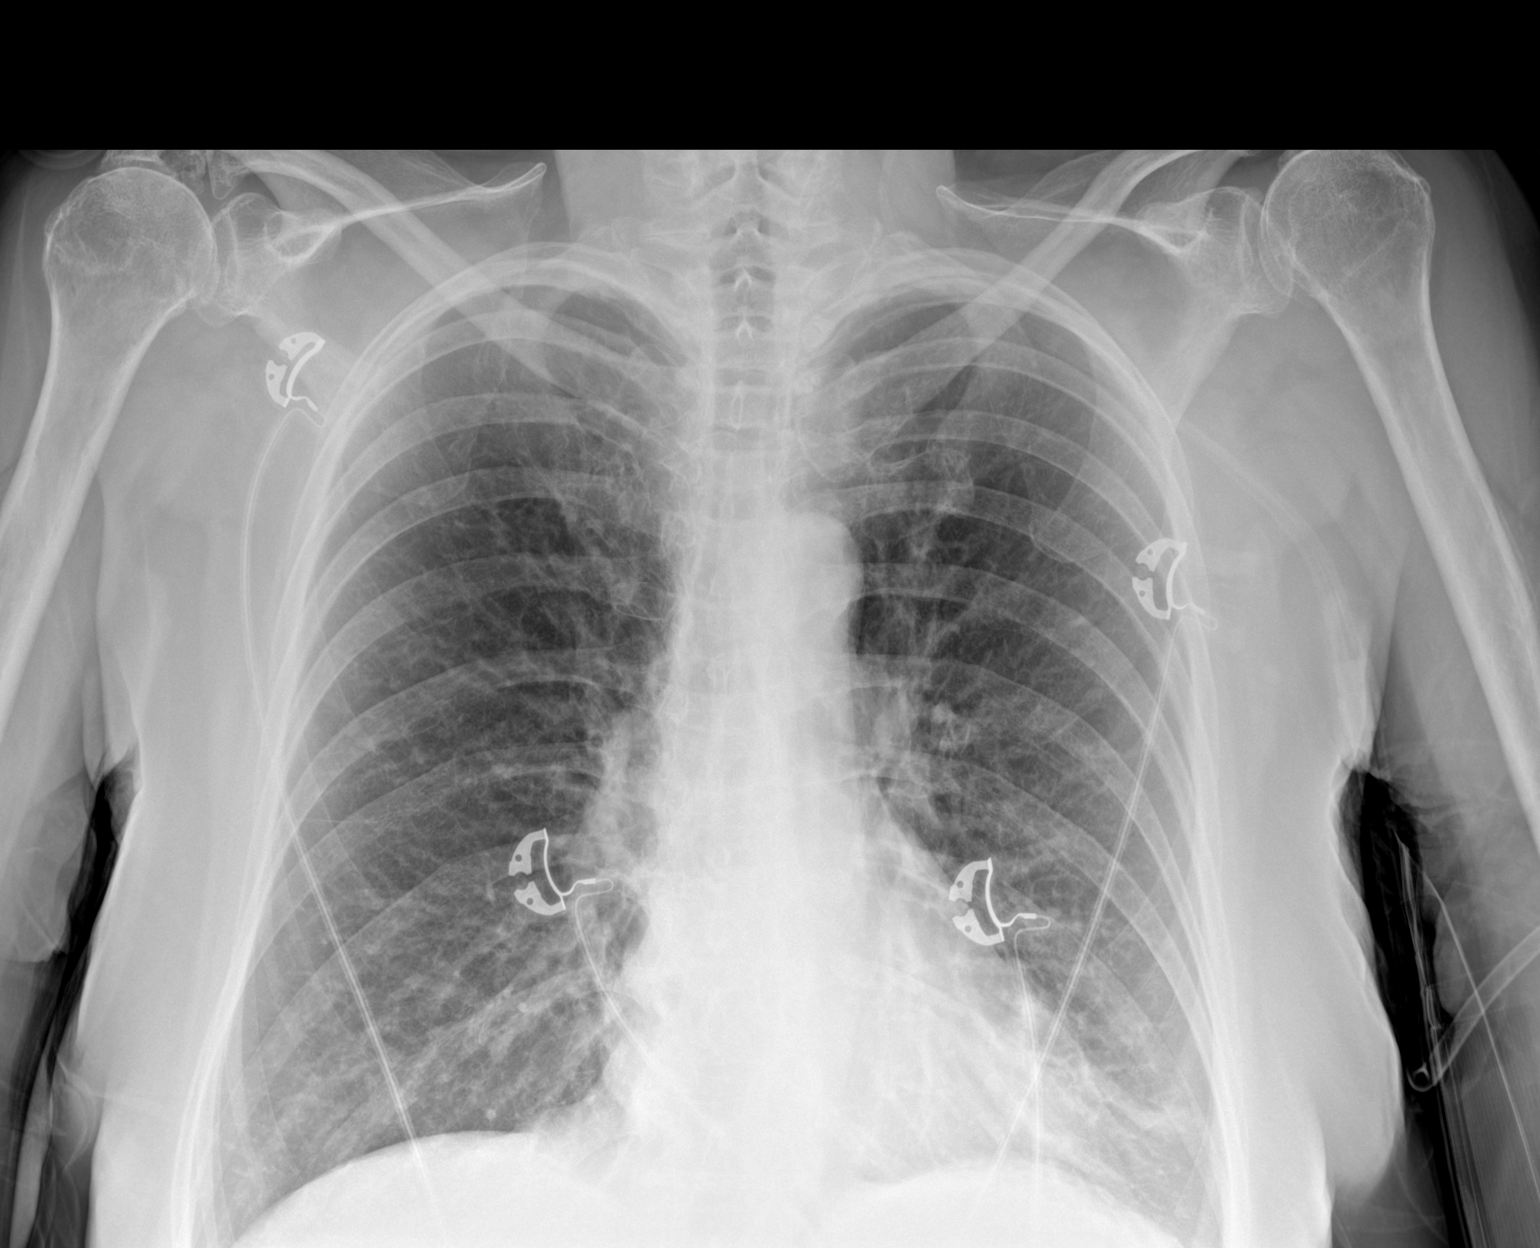

[2 of 2 positions shown; findings below may reference images not displayed]

FINDINGS: The heart size and mediastinal contours are within normal limits.
Band-like opacities in the left lung base, likely atelectasis.
Chronically coarsened interstitial markings bilaterally. No pleural
effusion or pneumothorax. Degenerative changes of the shoulders,
right worse than left.
IMPRESSION: Band-like opacities in the left lung base, likely atelectasis.

## 2022-06-15 DIAGNOSIS — J441 Chronic obstructive pulmonary disease with (acute) exacerbation: Secondary | ICD-10-CM | POA: Diagnosis not present

## 2022-06-17 DIAGNOSIS — J4520 Mild intermittent asthma, uncomplicated: Secondary | ICD-10-CM | POA: Diagnosis not present

## 2022-06-19 DIAGNOSIS — M66821 Spontaneous rupture of other tendons, right upper arm: Secondary | ICD-10-CM | POA: Diagnosis not present

## 2022-06-30 ENCOUNTER — Other Ambulatory Visit: Payer: Self-pay | Admitting: Nurse Practitioner

## 2022-06-30 DIAGNOSIS — F411 Generalized anxiety disorder: Secondary | ICD-10-CM

## 2022-07-07 NOTE — Progress Notes (Unsigned)
Established patient visit   Patient: Stephanie Hess   DOB: [birth year]   71 y.o. Female  MRN: [id number] Visit Date: 07/08/2022   No chief complaint on file.  Subjective    HPI  Follow up visit  -COPD --? smoking status.  -insomnia related to anxiety  --had been doing well with trazodone at bedtime at last visit.  Would occassionally take lorazepam for severe anxiety. May need refills.   Medications: Outpatient Medications Prior to Visit  Medication Sig   aspirin 81 MG chewable tablet Chew by mouth.   aspirin EC 81 MG tablet Take 1 tablet (81 mg total) by mouth daily.   busPIRone (BUSPAR) 10 MG tablet TAKE 1/2 TO 1 TABLET UP TO THREE TIMES DAILY AS NEEDED FOR ANXIETY   calcium-vitamin D (OSCAL WITH D) 500-200 MG-UNIT tablet Take 1 tablet by mouth.   formoterol (PERFOROMIST) 20 MCG/2ML nebulizer solution INHALE 2 ML (20 MCG TOTAL) BY NEBULIZATION TWO (2) TIMES A DAY.   ipratropium (ATROVENT) 0.02 % nebulizer solution Take 2.5 mLs (0.5 mg total) by nebulization 4 (four) times daily.   levalbuterol (XOPENEX HFA) 45 MCG/ACT inhaler INHALE 1 PUFF INTO THE LUNGS EVERY 6 HOURS AS NEEDED FOR WHEEZING.   LORazepam (ATIVAN) 0.5 MG tablet TAKE 1/2 TO 1 TABLET TWICE DAILY AS NEEDED FOR ANXIETY   OXYGEN Inhale into the lungs. 2 litre at night   rosuvastatin (CRESTOR) 5 MG tablet Take 5 mg by mouth daily.   Spacer/Aero-Holding Chambers (AEROCHAMBER MV) inhaler Use as instructed   traZODone (DESYREL) 50 MG tablet TAKE 1/2 TO 1 TABLET BY MOUTH AT BEDTIME AS NEEDED FOR SLEEP   No facility-administered medications prior to visit.    Review of Systems  {Labs (Optional):23779}   Objective    There were no vitals taken for this visit. BP Readings from Last 3 Encounters:  05/13/22 110/75  05/04/22 122/87  03/07/22 116/74    Wt Readings from Last 3 Encounters:  05/13/22 170 lb (77.1 kg)  03/07/22 172 lb 1.9 oz (78.1 kg)  01/28/22 169 lb 14.4 oz (77.1 kg)    Physical Exam  ***  No  results found for any visits on 07/08/22.  Assessment & Plan     Problem List Items Addressed This Visit   None    No follow-ups on file.         Ronnell Freshwater, NP  [facility] at [facility] [phone] (phone) [phone] (fax)  [place]

## 2022-07-08 ENCOUNTER — Encounter: Payer: Self-pay | Admitting: Nurse Practitioner

## 2022-07-08 ENCOUNTER — Ambulatory Visit (INDEPENDENT_AMBULATORY_CARE_PROVIDER_SITE_OTHER): Payer: Medicare Other | Admitting: Nurse Practitioner

## 2022-07-08 VITALS — BP 126/76 | HR 99 | Ht 62.0 in | Wt 166.0 lb

## 2022-07-08 DIAGNOSIS — K219 Gastro-esophageal reflux disease without esophagitis: Secondary | ICD-10-CM | POA: Diagnosis not present

## 2022-07-08 DIAGNOSIS — F411 Generalized anxiety disorder: Secondary | ICD-10-CM | POA: Diagnosis not present

## 2022-07-08 DIAGNOSIS — J449 Chronic obstructive pulmonary disease, unspecified: Secondary | ICD-10-CM | POA: Diagnosis not present

## 2022-07-08 MED ORDER — LORAZEPAM 0.5 MG PO TABS: ORAL_TABLET | ORAL | 3 refills | Status: DC

## 2022-07-08 MED ORDER — BUSPIRONE HCL 10 MG PO TABS: ORAL_TABLET | ORAL | 3 refills | Status: DC

## 2022-07-08 MED ORDER — TRAZODONE HCL 50 MG PO TABS: 25.0000 mg | ORAL_TABLET | Freq: Every evening | ORAL | 3 refills | Status: DC | PRN

## 2022-07-16 DIAGNOSIS — J441 Chronic obstructive pulmonary disease with (acute) exacerbation: Secondary | ICD-10-CM | POA: Diagnosis not present

## 2022-07-18 DIAGNOSIS — J4520 Mild intermittent asthma, uncomplicated: Secondary | ICD-10-CM | POA: Diagnosis not present

## 2022-08-13 DIAGNOSIS — D225 Melanocytic nevi of trunk: Secondary | ICD-10-CM | POA: Diagnosis not present

## 2022-08-13 DIAGNOSIS — L578 Other skin changes due to chronic exposure to nonionizing radiation: Secondary | ICD-10-CM | POA: Diagnosis not present

## 2022-08-13 DIAGNOSIS — D485 Neoplasm of uncertain behavior of skin: Secondary | ICD-10-CM | POA: Diagnosis not present

## 2022-08-13 DIAGNOSIS — L820 Inflamed seborrheic keratosis: Secondary | ICD-10-CM | POA: Diagnosis not present

## 2022-08-13 DIAGNOSIS — L821 Other seborrheic keratosis: Secondary | ICD-10-CM | POA: Diagnosis not present

## 2022-08-13 DIAGNOSIS — L570 Actinic keratosis: Secondary | ICD-10-CM | POA: Diagnosis not present

## 2022-08-13 DIAGNOSIS — L298 Other pruritus: Secondary | ICD-10-CM | POA: Diagnosis not present

## 2022-08-16 ENCOUNTER — Other Ambulatory Visit: Payer: Self-pay | Admitting: Nurse Practitioner

## 2022-08-16 DIAGNOSIS — F411 Generalized anxiety disorder: Secondary | ICD-10-CM

## 2022-08-17 DIAGNOSIS — J4520 Mild intermittent asthma, uncomplicated: Secondary | ICD-10-CM | POA: Diagnosis not present

## 2022-09-02 DIAGNOSIS — Z7951 Long term (current) use of inhaled steroids: Secondary | ICD-10-CM | POA: Diagnosis not present

## 2022-09-02 DIAGNOSIS — F17210 Nicotine dependence, cigarettes, uncomplicated: Secondary | ICD-10-CM | POA: Diagnosis not present

## 2022-09-02 DIAGNOSIS — J441 Chronic obstructive pulmonary disease with (acute) exacerbation: Secondary | ICD-10-CM | POA: Diagnosis not present

## 2022-09-02 DIAGNOSIS — M816 Localized osteoporosis [Lequesne]: Secondary | ICD-10-CM | POA: Diagnosis not present

## 2022-09-02 DIAGNOSIS — I2510 Atherosclerotic heart disease of native coronary artery without angina pectoris: Secondary | ICD-10-CM | POA: Diagnosis not present

## 2022-09-02 DIAGNOSIS — G4734 Idiopathic sleep related nonobstructive alveolar hypoventilation: Secondary | ICD-10-CM | POA: Diagnosis not present

## 2022-09-02 DIAGNOSIS — R0600 Dyspnea, unspecified: Secondary | ICD-10-CM | POA: Diagnosis not present

## 2022-09-02 DIAGNOSIS — J45901 Unspecified asthma with (acute) exacerbation: Secondary | ICD-10-CM | POA: Diagnosis not present

## 2022-09-02 DIAGNOSIS — R0602 Shortness of breath: Secondary | ICD-10-CM | POA: Diagnosis not present

## 2022-09-02 DIAGNOSIS — R059 Cough, unspecified: Secondary | ICD-10-CM | POA: Diagnosis not present

## 2022-09-02 DIAGNOSIS — J849 Interstitial pulmonary disease, unspecified: Secondary | ICD-10-CM | POA: Diagnosis not present

## 2022-09-17 DIAGNOSIS — J4520 Mild intermittent asthma, uncomplicated: Secondary | ICD-10-CM | POA: Diagnosis not present

## 2022-10-17 DIAGNOSIS — J4520 Mild intermittent asthma, uncomplicated: Secondary | ICD-10-CM | POA: Diagnosis not present

## 2022-11-17 DIAGNOSIS — J4520 Mild intermittent asthma, uncomplicated: Secondary | ICD-10-CM | POA: Diagnosis not present

## 2022-11-18 ENCOUNTER — Encounter: Payer: Self-pay | Admitting: Nurse Practitioner

## 2022-11-18 ENCOUNTER — Ambulatory Visit (INDEPENDENT_AMBULATORY_CARE_PROVIDER_SITE_OTHER): Payer: Medicare Other | Admitting: Nurse Practitioner

## 2022-11-18 VITALS — BP 143/86 | HR 92 | Resp 18 | Ht 62.0 in | Wt 166.0 lb

## 2022-11-18 DIAGNOSIS — F411 Generalized anxiety disorder: Secondary | ICD-10-CM | POA: Diagnosis not present

## 2022-11-18 DIAGNOSIS — K219 Gastro-esophageal reflux disease without esophagitis: Secondary | ICD-10-CM | POA: Diagnosis not present

## 2022-11-18 DIAGNOSIS — Z0000 Encounter for general adult medical examination without abnormal findings: Secondary | ICD-10-CM | POA: Diagnosis not present

## 2022-11-18 DIAGNOSIS — J449 Chronic obstructive pulmonary disease, unspecified: Secondary | ICD-10-CM | POA: Diagnosis not present

## 2022-11-18 DIAGNOSIS — Z6830 Body mass index (BMI) 30.0-30.9, adult: Secondary | ICD-10-CM

## 2022-11-18 NOTE — Progress Notes (Signed)
Subjective:   Stephanie Hess is a 72 y.o. female who presents for Medicare Annual (Subsequent) preventive examination. --hx COPD - does see pulmonology -will get bone density  along with next screening mammogram - due 01/2023  -?anxiety - taking buspirone prn  --taking lorazepam as needed.  -will get routine fasting labs if fasting.   Review of Systems    See HPI        Objective:    Today's Vitals   11/18/22 0924  BP: (Abnormal) 143/86  Pulse: 92  Resp: 18  SpO2: 96%  Weight: 166 lb (75.3 kg)   Body mass index is 30.36 kg/m.    Row Labels 07/12/2021   12:02 PM 04/05/2021   12:17 PM 07/17/2018   12:29 AM 07/16/2018    6:59 PM 07/16/2018    5:07 PM 07/11/2018    3:58 PM 06/04/2017    9:12 AM  Advanced Directives   Section Header. No data exists in this row.         Does Patient Have a Medical Advance Directive?   No No No No No No No  Would patient like information on creating a medical advance directive?   No - Patient declined  No - Patient declined No - Patient declined  No - Patient declined     Current Medications (verified) Outpatient Encounter Medications as of 11/18/2022  Medication Sig   busPIRone (BUSPAR) 10 MG tablet TAKE 1/2 TO 1 TABLET UP TO THREE TIMES DAILY AS NEEDED FOR ANXIETY   formoterol (PERFOROMIST) 20 MCG/2ML nebulizer solution INHALE 2 ML (20 MCG TOTAL) BY NEBULIZATION TWO (2) TIMES A DAY.   ipratropium (ATROVENT) 0.02 % nebulizer solution Take 2.5 mLs (0.5 mg total) by nebulization 4 (four) times daily.   levalbuterol (XOPENEX HFA) 45 MCG/ACT inhaler INHALE 1 PUFF INTO THE LUNGS EVERY 6 HOURS AS NEEDED FOR WHEEZING.   LORazepam (ATIVAN) 0.5 MG tablet TAKE 1/2 TO 1 TABLET TWICE DAILY AS NEEDED FOR ANXIETY   OXYGEN Inhale into the lungs. 2 litre at night   Spacer/Aero-Holding Chambers (AEROCHAMBER MV) inhaler Use as instructed   traZODone (DESYREL) 50 MG tablet Take 0.5-1 tablets (25-50 mg total) by mouth at bedtime as needed. for sleep   aspirin 81 MG  chewable tablet Chew by mouth. (Patient not taking: Reported on 11/18/2022)   aspirin EC 81 MG tablet Take 1 tablet (81 mg total) by mouth daily. (Patient not taking: Reported on 11/18/2022)   calcium-vitamin D (OSCAL WITH D) 500-200 MG-UNIT tablet Take 1 tablet by mouth. (Patient not taking: Reported on 11/18/2022)   rosuvastatin (CRESTOR) 5 MG tablet Take 5 mg by mouth daily. (Patient not taking: Reported on 11/18/2022)   No facility-administered encounter medications on file as of 11/18/2022.    Allergies (verified) Penicillins, Shellfish allergy, Sulfa antibiotics, Clarithromycin, Amoxicillin-pot clavulanate, Iodinated contrast media, Atorvastatin, and Macrobid [nitrofurantoin]   History: Past Medical History:  Diagnosis Date   Cancer ([place])    COPD (chronic obstructive pulmonary disease) ([place])    Diverticula of colon    GERD (gastroesophageal reflux disease)    Hyperlipidemia    Renal insufficiency    2009 or 2010   Past Surgical History:  Procedure Laterality Date   APPENDECTOMY     CESAREAN SECTION     ECTOPIC PREGNANCY SURGERY     Family History  Problem Relation Age of Onset   Breast cancer Paternal Aunt    Heart attack Mother    Social History   Socioeconomic  History   Marital status: Divorced    Spouse name: Not on file   Number of children: Not on file   Years of education: Not on file   Highest education level: Not on file  Occupational History   Not on file  Tobacco Use   Smoking status: Former    Packs/day: 0.50    Years: 50.00    Total pack years: 25.00    Types: Cigarettes    Passive exposure: Never   Smokeless tobacco: Never  Vaping Use   Vaping Use: Never used  Substance and Sexual Activity   Alcohol use: Yes    Comment: social   Drug use: No   Sexual activity: Not Currently  Other Topics Concern   Not on file  Social History Narrative   Not on file   Social Determinants of Health   Financial Resource Strain: Low Risk  (11/18/2022)   Overall  Financial Resource Strain (CARDIA)    Difficulty of Paying Living Expenses: Not hard at all  Food Insecurity: No Food Insecurity (11/18/2022)   Hunger Vital Sign    Worried About Running Out of Food in the Last Year: Never true    [place] in the Last Year: Never true  Transportation Needs: No Transportation Needs (11/18/2022)   PRAPARE - Hydrologist (Medical): No    Lack of Transportation (Non-Medical): No  Physical Activity: Inactive (11/18/2022)   Exercise Vital Sign    Days of Exercise per Week: 0 days    Minutes of Exercise per Session: 0 min  Stress: No Stress Concern Present (11/18/2022)   [place]    Feeling of Stress : Not at all  Social Connections: Socially Isolated (11/18/2022)   Social Connection and Isolation Panel [NHANES]    Frequency of Communication with Friends and Family: More than three times a week    Frequency of Social Gatherings with Friends and Family: Not on file    Attends Religious Services: Never    Marine scientist or Organizations: No    Attends Music therapist: Never    Marital Status: Divorced    Tobacco Counseling Counseling given: Not Answered   Clinical Intake:  Pre-visit preparation completed: Yes  Pain : No/denies pain     BMI - recorded: 30.36 Nutritional Status: BMI > 30  Obese Nutritional Risks: None Diabetes: No  How often do you need to have someone help you when you read instructions, pamphlets, or other written materials from your doctor or pharmacy?: 1 - Never  Diabetic? No  Interpreter Needed?: No      Activities of Daily Living   Row Labels 11/18/2022    9:28 AM 07/08/2022   11:26 AM  In your present state of health, do you have any difficulty performing the following activities:   Section Header. No data exists in this row.    Hearing?   0 0  Vision?   0 0  Difficulty concentrating or making  decisions?   0 0  Walking or climbing stairs?   1 0  Dressing or bathing?   0 0  Doing errands, shopping?   0 0    Patient Care Team: Ronnell Freshwater, NP as PCP - General (Family Medicine)  Indicate any recent Medical Services you may have received from other than [facility] providers in the past year (date may be approximate).     Assessment:  1. Encounter for Medicare annual wellness exam Annual medicare wellness visit   2. Chronic obstructive pulmonary disease, unspecified COPD type ([place]) Stable. Patient sees pulmonology at [facility] for management and surveillance.   3. Gastroesophageal reflux disease without esophagitis Take OTC pepcid or omeprazole as needed and as indicated. Avoid foods/situations which trigger symptoms.   4. Generalized anxiety disorder May take alprazolam and buspirone as needed and as prescribed   5. BMI 30.0-30.9,adult Discussed lowering calorie intake to 1500 calories per day and incorporating exercise into daily routine to help lose weight.    Hearing/Vision screen No results found.   Depression Screen   Row Labels 11/18/2022    9:27 AM 07/08/2022   11:25 AM 01/14/2022    4:02 PM 11/07/2021   10:35 AM 08/31/2021   10:06 AM 08/08/2021    1:19 PM 04/17/2021    2:58 PM  PHQ 2/9 Scores   Section Header. No data exists in this row.         PHQ - 2 Score   0 0 1 0 0 2 0  PHQ- 9 Score   '2 3 4 '$ 0 0 7 7    Fall Risk   Row Labels 11/18/2022    9:28 AM 11/07/2021   10:36 AM 08/31/2021   10:07 AM 08/08/2021    1:19 PM 04/17/2021    2:58 PM  Fall Risk    Section Header. No data exists in this row.       Falls in the past year?   0 0 0 0 0  Number falls in past yr:   0 0 0 0 0  Injury with Fall?   0 0 0 0 0  Follow up    Falls evaluation completed Falls evaluation completed Falls evaluation completed Falls evaluation completed    FALL RISK PREVENTION PERTAINING TO THE HOME:  Any stairs in or around the home? Yes  If so, are there any without handrails? No   Home free of loose throw rugs in walkways, pet beds, electrical cords, etc? Yes  Adequate lighting in your home to reduce risk of falls? Yes   ASSISTIVE DEVICES UTILIZED TO PREVENT FALLS:  Life alert? No  Use of a cane, walker or w/c? No  Grab bars in the bathroom? No  Shower chair or bench in shower? No  Elevated toilet seat or a handicapped toilet? No   TIMED UP AND GO:  Was the test performed? Yes .  Length of time to ambulate 10 feet: 10-15 sec.   Gait steady and fast without use of assistive device  Cognitive Function:       Row Labels 11/18/2022    9:14 AM 11/07/2021   10:42 AM  6CIT Screen   Section Header. No data exists in this row.    What Year?   0 points 0 points  What month?   0 points 0 points  What time?   0 points 0 points  Count back from 20   0 points 0 points  Months in reverse   0 points 0 points  Repeat phrase   0 points 2 points  Total Score   0 points 2 points    Immunizations Immunization History  Administered Date(s) Administered   Influenza Split 09/21/2014   Influenza, High Dose Seasonal PF 08/18/2018   Moderna Sars-Covid-2 Vaccination 10/18/2020, 03/19/2021   Tdap 09/21/2014    TDAP status: Up to date  Flu Vaccine status: Declined, Education has been provided regarding the  importance of this vaccine but patient still declined. Advised may receive this vaccine at local pharmacy or Health Dept. Aware to provide a copy of the vaccination record if obtained from local pharmacy or Health Dept. Verbalized acceptance and understanding.  Pneumococcal vaccine status: Declined,  Education has been provided regarding the importance of this vaccine but patient still declined. Advised may receive this vaccine at local pharmacy or Health Dept. Aware to provide a copy of the vaccination record if obtained from local pharmacy or Health Dept. Verbalized acceptance and understanding.   Covid-19 vaccine status: Information provided on how to obtain vaccines.    Qualifies for Shingles Vaccine? Yes   Zostavax completed No   Shingrix Completed?: No.    Education has been provided regarding the importance of this vaccine. Patient has been advised to call insurance company to determine out of pocket expense if they have not yet received this vaccine. Advised may also receive vaccine at local pharmacy or Health Dept. Verbalized acceptance and understanding.  Screening Tests Health Maintenance  Topic Date Due   COVID-19 Vaccine (3 - Moderna risk series) 04/16/2021   Lung Cancer Screening  01/09/2022   INFLUENZA VACCINE  02/09/2023 (Originally 06/11/2022)   Zoster Vaccines- Shingrix (1 of 2) 02/17/2023 (Originally [birth year]26/1971)   Pneumonia Vaccine 82+ Years old (1 - PCV) 11/19/2023 (Originally 09/25/1957)   MAMMOGRAM  01/31/2024   DTaP/Tdap/Td (2 - Td or Tdap) 09/21/2024   COLONOSCOPY (Pts 45-68yr Insurance coverage will need to be confirmed)  09/20/2029   DEXA SCAN  Completed   Hepatitis C Screening  Completed   HPV VACCINES  Aged Out    Health Maintenance  Health Maintenance Due  Topic Date Due   COVID-19 Vaccine (3 - Moderna risk series) 04/16/2021   Lung Cancer Screening  01/09/2022    Colorectal cancer screening: Type of screening: Colonoscopy. Completed 09/21/2019. Repeat every 10 years  Mammogram status: Completed 01/30/2022. Repeat every year  Bone Density status: Completed 11/07/2021. Results reflect: Bone density results: NORMAL. Repeat every 10 years.  Lung Cancer Screening: (Low Dose CT Chest recommended if Age 227-80years, 30 pack-year currently smoking OR have quit w/in 15years.) does qualify.   Lung Cancer Screening Referral: Completed 07/03/22023  Additional Screening:  Hepatitis C Screening: does qualify; Completed 08/05/2019  Vision Screening: Recommended annual ophthalmology exams for early detection of glaucoma and other disorders of the eye. Is the patient up to date with their annual eye exam?  Yes  Who is the  provider or what is the name of the office in which the patient attends annual eye exams? [facility] If pt is not established with a provider, would they like to be referred to a provider to establish care?  Patient established .   Dental Screening: Recommended annual dental exams for proper oral hygiene  Community Resource Referral / Chronic Care Management: CRR required this visit?  No   CCM required this visit?  No      Plan:     I have personally reviewed and noted the following in the patient's chart:   Medical and social history Use of alcohol, tobacco or illicit drugs  Current medications and supplements including opioid prescriptions. Patient is not currently taking opioid prescriptions. Functional ability and status Nutritional status Physical activity Advanced directives List of other physicians Hospitalizations, surgeries, and ER visits in previous 12 months Vitals Screenings to include cognitive, depression, and falls Referrals and appointments  In addition, I have reviewed and discussed with patient certain preventive  protocols, quality metrics, and best practice recommendations. A written personalized care plan for preventive services as well as general preventive health recommendations were provided to patient.     Ronnell Freshwater, NP   11/18/2022   Nurse Notes: face to face 20 min

## 2022-11-25 ENCOUNTER — Other Ambulatory Visit: Payer: Self-pay | Admitting: Nurse Practitioner

## 2022-11-25 DIAGNOSIS — F411 Generalized anxiety disorder: Secondary | ICD-10-CM

## 2022-11-25 NOTE — Telephone Encounter (Signed)
L.O.V: 11/18/22  N.O.V: 05/19/23  L.R.F: 07/08/22 30 tab 3 refill

## 2022-12-10 IMAGING — US US PELVIS COMPLETE WITH TRANSVAGINAL
1 series · 14 of 25 positions shown · non-contrast
Comparison: None.
COMPARISON: None.

Addendum:
CLINICAL DATA: Postmenopausal bleeding

EXAM:
TRANSABDOMINAL AND TRANSVAGINAL ULTRASOUND OF PELVIS
DOPPLER ULTRASOUND OF OVARIES
TECHNIQUE: Both transabdominal and transvaginal ultrasound examinations of the
pelvis were performed. Transabdominal technique was performed for
global imaging of the pelvis including uterus, ovaries, adnexal
regions, and pelvic cul-de-sac.
It was necessary to proceed with endovaginal exam following the
transabdominal exam to visualize the endometrium and ovaries. Color
and duplex Doppler ultrasound was utilized to evaluate blood flow to
the ovaries.

[Series 1: us pelvis complete · 14 of 55 slices shown]
[im 1/55]
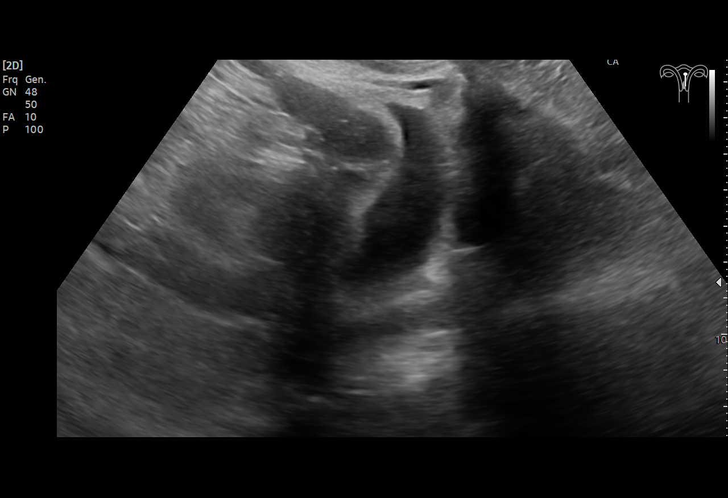
[im 5/55]
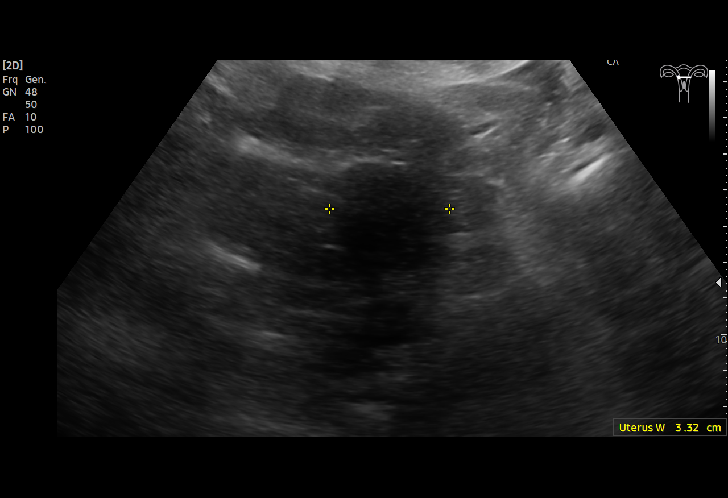
[im 10/55]
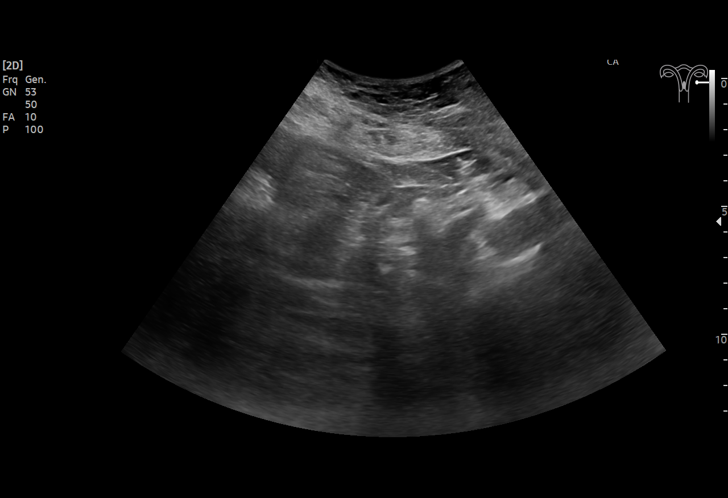
[im 14/55]
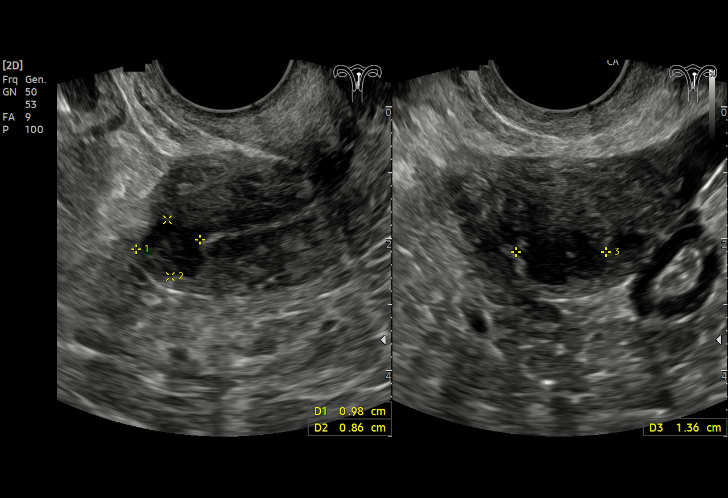
[im 19/55]
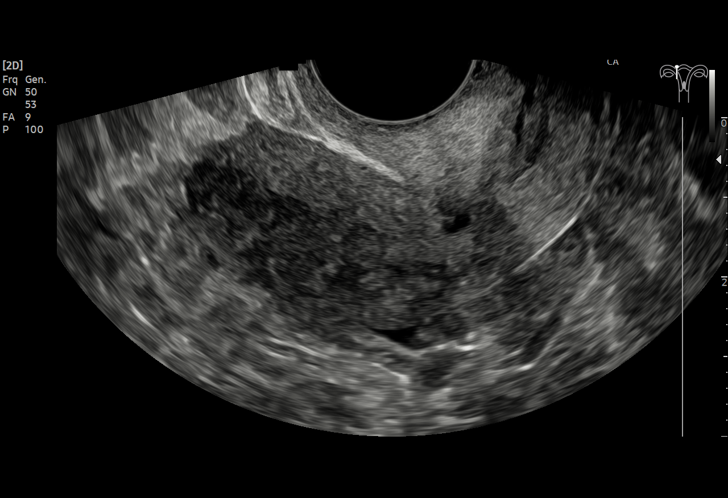
[im 21/55]
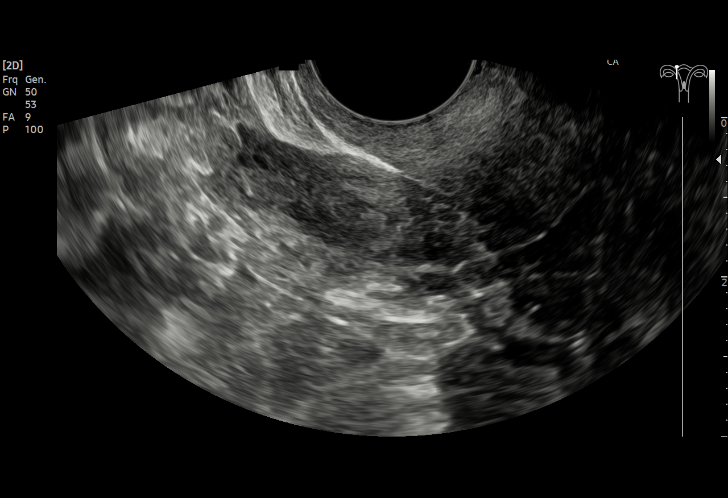
[im 25/55]
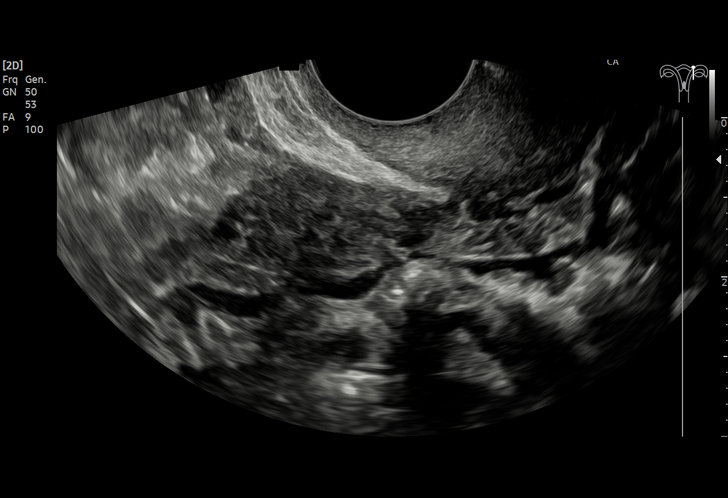
[im 30/55]
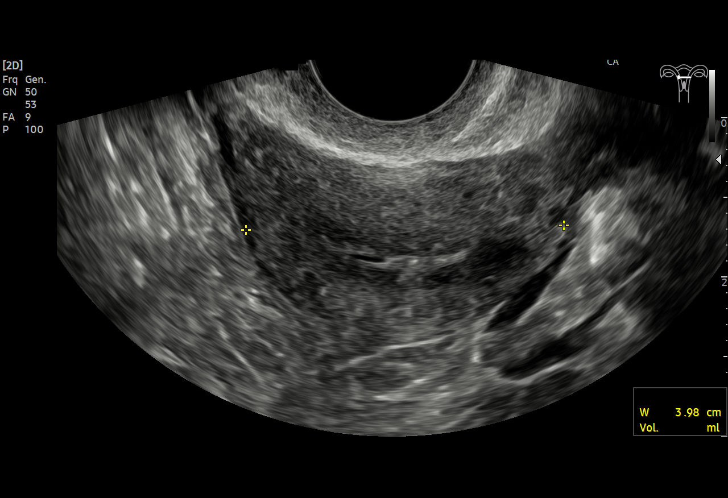
[im 34/55]
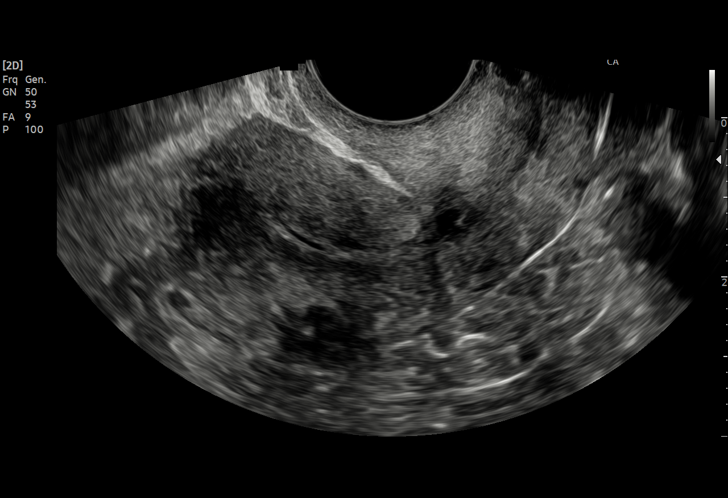
[im 37/55]
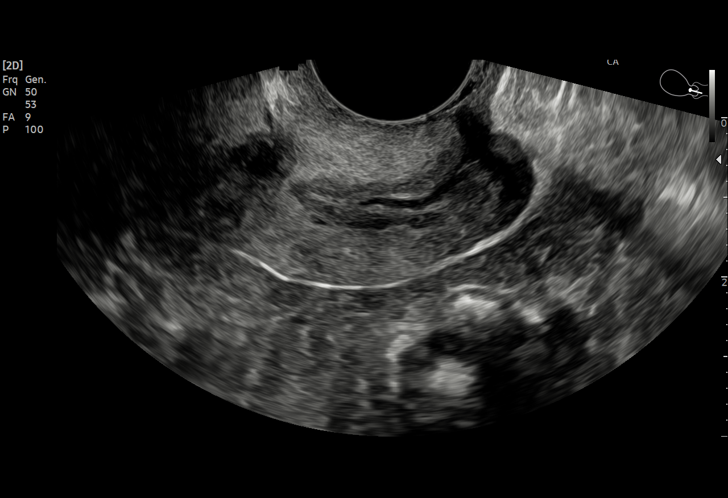
[im 41/55]
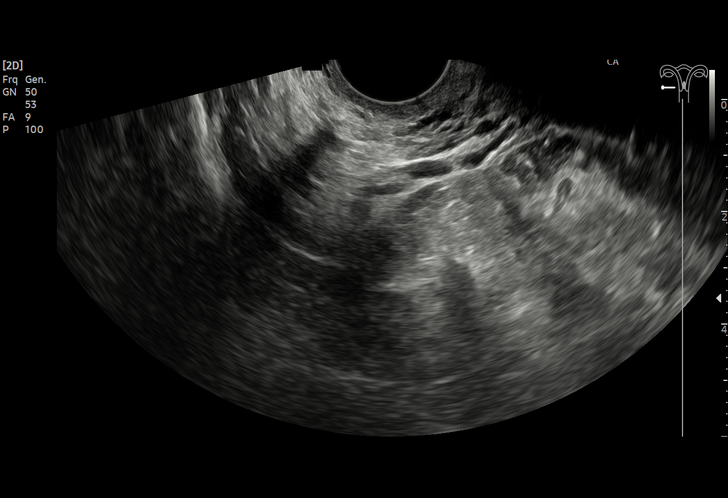
[im 46/55]
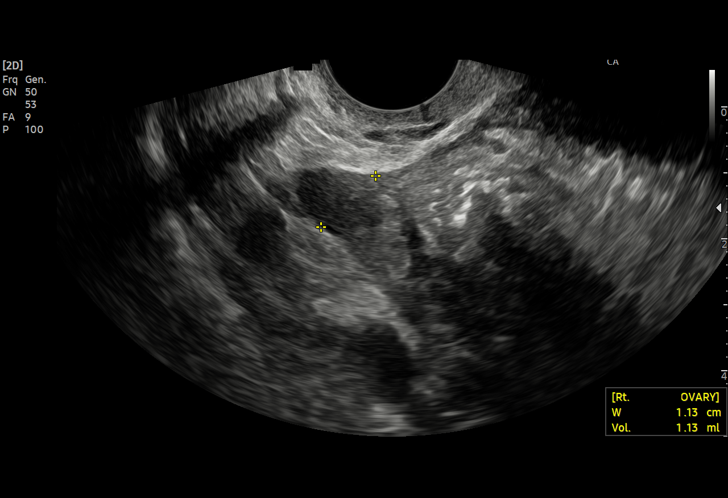
[im 50/55]
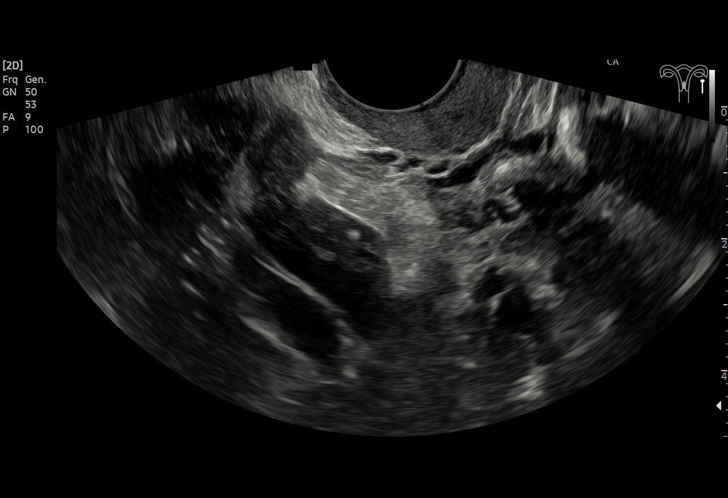
[im 55/55]
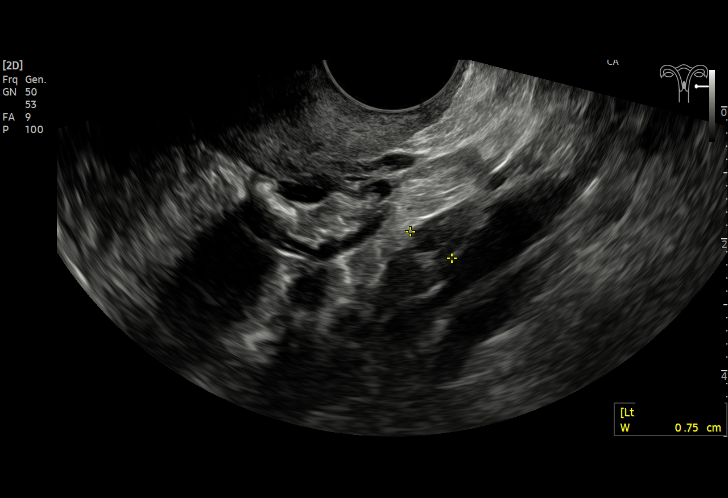

[14 of 25 positions shown; findings below may reference images not displayed]

FINDINGS: Uterus

Measurements: 5.4 x 2.1 x 4 cm = volume: 23.2 mL. There is
inhomogeneous echogenicity. There are few small uterine fibroids
largest measuring 1.3 x 0.8 x 1.1 cm in the posterior body.

Endometrium

Thickness: 3 mm. Small amount of fluid is seen in the endometrial
cavity and cervical canal.

Right ovary

Measurements: 2.2 x 0.9 x 1.1 cm = volume: 1.1 mL. Normal
appearance/no adnexal mass.

Left ovary

Measurements: 1.6 x 0.8 x 0.8 cm = volume: 0.5 mL. Normal
appearance/no adnexal mass.

Pulsed Doppler evaluation of both ovaries demonstrates normal
low-resistance arterial and venous waveforms.

Other findings

No abnormal free fluid.
IMPRESSION: There is inhomogeneous echogenicity in myometrium with multiple
small fibroids. There is small amount of fluid in the endometrial
cavity and cervical canal. There is no abnormal thickening of
endometrium.

Pelvic sonogram is otherwise

ADDENDUM:
This addendum is made to clarify the Doppler technique used for the
study. Color flow Doppler was performed. Pulsed Doppler examination
was not performed.

*** End of Addendum ***
FINDINGS: Uterus

Measurements: 5.4 x 2.1 x 4 cm = volume: 23.2 mL. There is
inhomogeneous echogenicity. There are few small uterine fibroids
largest measuring 1.3 x 0.8 x 1.1 cm in the posterior body.

Endometrium

Thickness: 3 mm. Small amount of fluid is seen in the endometrial
cavity and cervical canal.

Right ovary

Measurements: 2.2 x 0.9 x 1.1 cm = volume: 1.1 mL. Normal
appearance/no adnexal mass.

Left ovary

Measurements: 1.6 x 0.8 x 0.8 cm = volume: 0.5 mL. Normal
appearance/no adnexal mass.

Pulsed Doppler evaluation of both ovaries demonstrates normal
low-resistance arterial and venous waveforms.

Other findings

No abnormal free fluid.
IMPRESSION: There is inhomogeneous echogenicity in myometrium with multiple
small fibroids. There is small amount of fluid in the endometrial
cavity and cervical canal. There is no abnormal thickening of
endometrium.

Pelvic sonogram is otherwise

## 2022-12-18 DIAGNOSIS — J4520 Mild intermittent asthma, uncomplicated: Secondary | ICD-10-CM | POA: Diagnosis not present

## 2023-01-16 DIAGNOSIS — J4520 Mild intermittent asthma, uncomplicated: Secondary | ICD-10-CM | POA: Diagnosis not present

## 2023-02-16 DIAGNOSIS — J4520 Mild intermittent asthma, uncomplicated: Secondary | ICD-10-CM | POA: Diagnosis not present

## 2023-02-20 ENCOUNTER — Other Ambulatory Visit: Payer: Self-pay | Admitting: Nurse Practitioner

## 2023-02-20 DIAGNOSIS — J441 Chronic obstructive pulmonary disease with (acute) exacerbation: Secondary | ICD-10-CM

## 2023-02-20 DIAGNOSIS — F411 Generalized anxiety disorder: Secondary | ICD-10-CM

## 2023-03-10 DIAGNOSIS — F17210 Nicotine dependence, cigarettes, uncomplicated: Secondary | ICD-10-CM | POA: Diagnosis not present

## 2023-03-10 DIAGNOSIS — J849 Interstitial pulmonary disease, unspecified: Secondary | ICD-10-CM | POA: Diagnosis not present

## 2023-03-10 DIAGNOSIS — R0600 Dyspnea, unspecified: Secondary | ICD-10-CM | POA: Diagnosis not present

## 2023-03-10 DIAGNOSIS — J441 Chronic obstructive pulmonary disease with (acute) exacerbation: Secondary | ICD-10-CM | POA: Diagnosis not present

## 2023-03-10 DIAGNOSIS — G4734 Idiopathic sleep related nonobstructive alveolar hypoventilation: Secondary | ICD-10-CM | POA: Diagnosis not present

## 2023-03-10 DIAGNOSIS — J45901 Unspecified asthma with (acute) exacerbation: Secondary | ICD-10-CM | POA: Diagnosis not present

## 2023-03-10 DIAGNOSIS — Z289 Immunization not carried out for unspecified reason: Secondary | ICD-10-CM | POA: Diagnosis not present

## 2023-03-10 DIAGNOSIS — R0902 Hypoxemia: Secondary | ICD-10-CM | POA: Diagnosis not present

## 2023-03-10 DIAGNOSIS — I2510 Atherosclerotic heart disease of native coronary artery without angina pectoris: Secondary | ICD-10-CM | POA: Diagnosis not present

## 2023-03-10 DIAGNOSIS — R0602 Shortness of breath: Secondary | ICD-10-CM | POA: Diagnosis not present

## 2023-03-10 DIAGNOSIS — J439 Emphysema, unspecified: Secondary | ICD-10-CM | POA: Diagnosis not present

## 2023-03-14 DIAGNOSIS — R5381 Other malaise: Secondary | ICD-10-CM | POA: Diagnosis not present

## 2023-03-14 DIAGNOSIS — J449 Chronic obstructive pulmonary disease, unspecified: Secondary | ICD-10-CM | POA: Diagnosis not present

## 2023-03-14 DIAGNOSIS — J441 Chronic obstructive pulmonary disease with (acute) exacerbation: Secondary | ICD-10-CM | POA: Diagnosis not present

## 2023-03-18 DIAGNOSIS — J4520 Mild intermittent asthma, uncomplicated: Secondary | ICD-10-CM | POA: Diagnosis not present

## 2023-04-14 DIAGNOSIS — J441 Chronic obstructive pulmonary disease with (acute) exacerbation: Secondary | ICD-10-CM | POA: Diagnosis not present

## 2023-04-14 DIAGNOSIS — R5381 Other malaise: Secondary | ICD-10-CM | POA: Diagnosis not present

## 2023-04-14 DIAGNOSIS — J449 Chronic obstructive pulmonary disease, unspecified: Secondary | ICD-10-CM | POA: Diagnosis not present

## 2023-04-18 DIAGNOSIS — J4520 Mild intermittent asthma, uncomplicated: Secondary | ICD-10-CM | POA: Diagnosis not present

## 2023-05-08 ENCOUNTER — Other Ambulatory Visit: Payer: Self-pay | Admitting: Nurse Practitioner

## 2023-05-08 DIAGNOSIS — F411 Generalized anxiety disorder: Secondary | ICD-10-CM

## 2023-05-14 DIAGNOSIS — R5381 Other malaise: Secondary | ICD-10-CM | POA: Diagnosis not present

## 2023-05-14 DIAGNOSIS — J441 Chronic obstructive pulmonary disease with (acute) exacerbation: Secondary | ICD-10-CM | POA: Diagnosis not present

## 2023-05-14 DIAGNOSIS — J449 Chronic obstructive pulmonary disease, unspecified: Secondary | ICD-10-CM | POA: Diagnosis not present

## 2023-05-18 DIAGNOSIS — J4520 Mild intermittent asthma, uncomplicated: Secondary | ICD-10-CM | POA: Diagnosis not present

## 2023-05-19 ENCOUNTER — Ambulatory Visit: Payer: Medicare Other | Admitting: Nurse Practitioner

## 2023-05-23 ENCOUNTER — Ambulatory Visit (INDEPENDENT_AMBULATORY_CARE_PROVIDER_SITE_OTHER): Payer: Medicare Other | Admitting: Family Medicine

## 2023-05-23 ENCOUNTER — Encounter: Payer: Self-pay | Admitting: Family Medicine

## 2023-05-23 VITALS — BP 138/82 | HR 72 | Resp 18 | Ht 62.0 in | Wt 163.0 lb

## 2023-05-23 DIAGNOSIS — F411 Generalized anxiety disorder: Secondary | ICD-10-CM

## 2023-05-23 DIAGNOSIS — E782 Mixed hyperlipidemia: Secondary | ICD-10-CM | POA: Diagnosis not present

## 2023-05-23 DIAGNOSIS — I700 Atherosclerosis of aorta: Secondary | ICD-10-CM

## 2023-05-23 DIAGNOSIS — J449 Chronic obstructive pulmonary disease, unspecified: Secondary | ICD-10-CM | POA: Diagnosis not present

## 2023-05-23 MED ORDER — BUSPIRONE HCL 10 MG PO TABS: ORAL_TABLET | ORAL | 2 refills | Status: DC

## 2023-05-23 MED ORDER — TRAZODONE HCL 50 MG PO TABS: 25.0000 mg | ORAL_TABLET | Freq: Every evening | ORAL | 1 refills | Status: DC | PRN

## 2023-05-23 MED ORDER — LORAZEPAM 0.5 MG PO TABS: ORAL_TABLET | ORAL | 1 refills | Status: DC

## 2023-05-23 MED ORDER — ASPIRIN 81 MG PO TBEC: 81.0000 mg | DELAYED_RELEASE_TABLET | Freq: Every day | ORAL | 3 refills | Status: DC

## 2023-05-23 NOTE — Assessment & Plan Note (Signed)
Initially noted on CT scan in 2022:  Aortic atherosclerosis, in addition to left main and 2 vessel coronary artery disease.  Will be important to maintain ideal cholesterol levels.  If levels are elevated as they were when last checked, we will likely start a statin medication.  We may also discuss at her next appointment whether a referral to cardiology is warranted.  She is due for her lung cancer screening as well, which may also reveal some of the changes that may have occurred the past couple of years.

## 2023-05-23 NOTE — Progress Notes (Signed)
Established Patient Office Visit  Subjective   Patient ID: Stephanie Hess, female    DOB: 04-14-1951  Age: 72 y.o. MRN: [id number]  Chief Complaint  Patient presents with   Anxiety    HPI Stephanie Hess is a 72 y.o. female presenting today for follow up of mood. Mood: Patient is here to follow up for anxiety, currently managing with buspirone, trazodone.  She also has lorazepam for breakthrough anxiety. Taking medication without side effects, reports excellent compliance with treatment. Denies mood changes or SI/HI. She feels mood is stable since last visit.     05/23/2023   10:48 AM 11/18/2022    9:27 AM 07/08/2022   11:25 AM  Depression screen PHQ 2/9  Decreased Interest 0 0 0  Down, Depressed, Hopeless 0 0 0  PHQ - 2 Score 0 0 0  Altered sleeping 0 0 1  Tired, decreased energy 0 1 0  Change in appetite 0 1 1  Feeling bad or failure about yourself  0 0 0  Trouble concentrating 0 0 0  Moving slowly or fidgety/restless 0 0 1  Suicidal thoughts 0 0 0  PHQ-9 Score 0 2 3  Difficult doing work/chores Not difficult at all Not difficult at all        05/23/2023   10:48 AM 11/18/2022    9:28 AM 07/08/2022   11:25 AM 01/14/2022    4:02 PM  GAD 7 : Generalized Anxiety Score  Nervous, Anxious, on Edge 0 0 0 0  Control/stop worrying 0 0 0 0  Worry too much - different things 0 0 0 0  Trouble relaxing 0 0 0 0  Restless 0 0 0 0  Easily annoyed or irritable 0 0 0 0  Afraid - awful might happen 0 0 0 0  Total GAD 7 Score 0 0 0 0  Anxiety Difficulty Not difficult at all Not difficult at all     ROS Negative unless otherwise noted in HPI   Objective:     BP 138/82 (BP Location: Right Arm, Patient Position: Sitting, Cuff Size: Normal)   Pulse 72   Resp 18   Ht 5\' 2"  (1.575 m)   Wt 163 lb (73.9 kg)   SpO2 96%   BMI 29.81 kg/m   Physical Exam Constitutional:      General: She is not in acute distress.    Appearance: Normal appearance.  HENT:     Head: Normocephalic and  atraumatic.  Cardiovascular:     Rate and Rhythm: Normal rate and regular rhythm.     Heart sounds: No murmur heard.    No friction rub. No gallop.  Pulmonary:     Effort: Pulmonary effort is normal. No respiratory distress.     Breath sounds: No wheezing, rhonchi or rales.  Musculoskeletal:     Cervical back: Normal range of motion.  Skin:    General: Skin is warm and dry.  Neurological:     General: No focal deficit present.     Mental Status: She is alert and oriented to person, place, and time. Mental status is at baseline.  Psychiatric:        Mood and Affect: Mood normal.        Thought Content: Thought content normal.        Judgment: Judgment normal.      Assessment & Plan:  Generalized anxiety disorder Assessment & Plan: Continue buspirone 10 mg up to 3 times daily as needed, trazodone 50  mg nightly, and lorazepam 0.5 mg as needed for breakthrough anxiety.  Orders: -     CBC with Differential/Platelet; Future -     Comprehensive metabolic panel; Future -     busPIRone HCl; TAKE 1/2 TO 1 TABLET UP TO THREE TIMES DAILY AS NEEDED FOR ANXIETY  Dispense: 270 tablet; Refill: 2 -     traZODone HCl; Take 0.5-1 tablets (25-50 mg total) by mouth at bedtime as needed. for sleep  Dispense: 90 tablet; Refill: 1 -     LORazepam; TAKE 1/2 TO 1 TABLET TWICE DAILY AS NEEDED FOR ANXIETY  Dispense: 30 tablet; Refill: 1  Chronic obstructive pulmonary disease, unspecified COPD type ([facility]) Assessment & Plan: Followed by pulmonology.  Generally stable.  Continue leave albuterol inhaler, ipratropium nebulizer solution as needed.  Will continue to monitor.   Hyperlipidemia, mixed Assessment & Plan: Most recent lipid panel was in 2022, repeating lipid panel today.  Continue routine physical activity and low-fat diet.  Will alter management as indicated by lab results.  The 10-year ASCVD risk score (Arnett DK, et al., 2019) is: 12.7%  Orders: -     Lipid panel; Future  Aortic  atherosclerosis [facility]) Assessment & Plan: Initially noted on CT scan in 2022:  Aortic atherosclerosis, in addition to left main and 2 vessel coronary artery disease.  Will be important to maintain ideal cholesterol levels.  If levels are elevated as they were when last checked, we will likely start a statin medication.  We may also discuss at her next appointment whether a referral to cardiology is warranted.  She is due for her lung cancer screening as well, which may also reveal some of the changes that may have occurred the past couple of years.     Return in about 6 months (around 11/23/2023) for annual physical, fasting blood work 1 week before.    Melida Quitter, PA

## 2023-05-23 NOTE — Assessment & Plan Note (Signed)
Followed by pulmonology.  Generally stable.  Continue leave albuterol inhaler, ipratropium nebulizer solution as needed.  Will continue to monitor.

## 2023-05-23 NOTE — Assessment & Plan Note (Signed)
Continue buspirone 10 mg up to 3 times daily as needed, trazodone 50 mg nightly, and lorazepam 0.5 mg as needed for breakthrough anxiety.

## 2023-05-23 NOTE — Assessment & Plan Note (Addendum)
Most recent lipid panel was in 2022, repeating lipid panel today.  Continue routine physical activity and low-fat diet.  Will alter management as indicated by lab results.  The 10-year ASCVD risk score (Arnett DK, et al., 2019) is: 12.7%

## 2023-05-24 LAB — COMPREHENSIVE METABOLIC PANEL
ALT: 40 IU/L — ABNORMAL HIGH (ref 0–32)
AST: 28 IU/L (ref 0–40)
Albumin: 4.3 g/dL (ref 3.9–4.9)
Alkaline Phosphatase: 63 IU/L (ref 44–121)
BUN/Creatinine Ratio: 15 (ref 12–28)
BUN: 11 mg/dL (ref 8–27)
Bilirubin Total: <0.2 mg/dL (ref 0.0–1.2)
CO2: 19 mmol/L — ABNORMAL LOW (ref 20–29)
Calcium: 9.8 mg/dL (ref 8.7–10.3)
Chloride: 106 mmol/L (ref 96–106)
Creatinine, Ser: 0.71 mg/dL (ref 0.57–1.00)
Globulin, Total: 2.6 g/dL (ref 1.5–4.5)
Glucose: 109 mg/dL — ABNORMAL HIGH (ref 70–99)
Potassium: 4.2 mmol/L (ref 3.5–5.2)
Sodium: 141 mmol/L (ref 134–144)
Total Protein: 6.9 g/dL (ref 6.0–8.5)
eGFR: 91 mL/min/{1.73_m2} (ref >59)

## 2023-05-24 LAB — CBC WITH DIFFERENTIAL/PLATELET
Basophils Absolute: 0.1 10*3/uL (ref 0.0–0.2)
Basos: 1 %
EOS (ABSOLUTE): 0.1 10*3/uL (ref 0.0–0.4)
Eos: 1 %
Hematocrit: 38.5 % (ref 34.0–46.6)
Hemoglobin: 12.9 g/dL (ref 11.1–15.9)
Immature Grans (Abs): 0.1 10*3/uL (ref 0.0–0.1)
Immature Granulocytes: 1 %
Lymphocytes Absolute: 1.9 10*3/uL (ref 0.7–3.1)
Lymphs: 20 %
MCH: 30.3 pg (ref 26.6–33.0)
MCHC: 33.5 g/dL (ref 31.5–35.7)
MCV: 90 fL (ref 79–97)
Monocytes Absolute: 0.5 10*3/uL (ref 0.1–0.9)
Monocytes: 5 %
Neutrophils Absolute: 6.6 10*3/uL (ref 1.4–7.0)
Neutrophils: 72 %
Platelets: 330 10*3/uL (ref 150–450)
RBC: 4.26 x10E6/uL (ref 3.77–5.28)
RDW: 13.4 % (ref 11.7–15.4)
WBC: 9.2 10*3/uL (ref 3.4–10.8)

## 2023-05-24 LAB — LIPID PANEL
Chol/HDL Ratio: 4.6 ratio — ABNORMAL HIGH (ref 0.0–4.4)
Cholesterol, Total: 247 mg/dL — ABNORMAL HIGH (ref 100–199)
HDL: 54 mg/dL (ref >39)
LDL Chol Calc (NIH): 144 mg/dL — ABNORMAL HIGH (ref 0–99)
Triglycerides: 269 mg/dL — ABNORMAL HIGH (ref 0–149)
VLDL Cholesterol Cal: 49 mg/dL — ABNORMAL HIGH (ref 5–40)

## 2023-05-26 ENCOUNTER — Other Ambulatory Visit: Payer: Self-pay | Admitting: Family Medicine

## 2023-05-26 DIAGNOSIS — E782 Mixed hyperlipidemia: Secondary | ICD-10-CM

## 2023-05-26 MED ORDER — ROSUVASTATIN CALCIUM 5 MG PO TABS: 5.0000 mg | ORAL_TABLET | Freq: Every day | ORAL | 3 refills | Status: DC

## 2023-05-29 ENCOUNTER — Other Ambulatory Visit: Payer: Self-pay | Admitting: Family Medicine

## 2023-05-29 DIAGNOSIS — Z1231 Encounter for screening mammogram for malignant neoplasm of breast: Secondary | ICD-10-CM

## 2023-06-14 DIAGNOSIS — R5381 Other malaise: Secondary | ICD-10-CM | POA: Diagnosis not present

## 2023-06-14 DIAGNOSIS — J441 Chronic obstructive pulmonary disease with (acute) exacerbation: Secondary | ICD-10-CM | POA: Diagnosis not present

## 2023-06-14 DIAGNOSIS — J449 Chronic obstructive pulmonary disease, unspecified: Secondary | ICD-10-CM | POA: Diagnosis not present

## 2023-06-18 DIAGNOSIS — J4520 Mild intermittent asthma, uncomplicated: Secondary | ICD-10-CM | POA: Diagnosis not present

## 2023-06-19 DIAGNOSIS — H2513 Age-related nuclear cataract, bilateral: Secondary | ICD-10-CM | POA: Diagnosis not present

## 2023-06-23 ENCOUNTER — Telehealth: Payer: Self-pay

## 2023-06-23 NOTE — Telephone Encounter (Signed)
Spoke with patient regarding a faxed order from [facility] regarding Oxygen order. Patient called [facility] and confirmed that [facility] has dropped her from [place] and now has to use [employer] for oxygen supplies. Order has been placed in Dr. Lonell Face box due to [place], [place] being out of the office.

## 2023-06-23 NOTE — Telephone Encounter (Signed)
Pt is requesting a callback as she has some questions.  [phone]

## 2023-06-24 ENCOUNTER — Ambulatory Visit
Admission: RE | Admit: 2023-06-24 | Discharge: 2023-06-24 | Disposition: A | Discharge status: Home / Self Care | Payer: Medicare Other | Source: Ambulatory Visit | Attending: Family Medicine | Admitting: Family Medicine

## 2023-06-24 DIAGNOSIS — Z1231 Encounter for screening mammogram for malignant neoplasm of breast: Secondary | ICD-10-CM | POA: Diagnosis not present

## 2023-06-26 ENCOUNTER — Other Ambulatory Visit: Payer: Self-pay | Admitting: Family Medicine

## 2023-06-26 DIAGNOSIS — R928 Other abnormal and inconclusive findings on diagnostic imaging of breast: Secondary | ICD-10-CM

## 2023-06-26 DIAGNOSIS — N63 Unspecified lump in unspecified breast: Secondary | ICD-10-CM

## 2023-06-30 ENCOUNTER — Ambulatory Visit
Admission: RE | Admit: 2023-06-30 | Discharge: 2023-06-30 | Disposition: A | Discharge status: Home / Self Care | Payer: Medicare Other | Source: Ambulatory Visit | Attending: Family Medicine | Admitting: Family Medicine

## 2023-06-30 DIAGNOSIS — N63 Unspecified lump in unspecified breast: Secondary | ICD-10-CM

## 2023-06-30 DIAGNOSIS — R928 Other abnormal and inconclusive findings on diagnostic imaging of breast: Secondary | ICD-10-CM | POA: Diagnosis not present

## 2023-06-30 DIAGNOSIS — R92322 Mammographic fibroglandular density, left breast: Secondary | ICD-10-CM | POA: Diagnosis not present

## 2023-06-30 DIAGNOSIS — N6325 Unspecified lump in the left breast, overlapping quadrants: Secondary | ICD-10-CM | POA: Diagnosis not present

## 2023-07-01 ENCOUNTER — Encounter: Payer: Self-pay | Admitting: Family Medicine

## 2023-07-01 ENCOUNTER — Other Ambulatory Visit: Payer: Self-pay | Admitting: Family Medicine

## 2023-07-01 DIAGNOSIS — N6489 Other specified disorders of breast: Secondary | ICD-10-CM

## 2023-07-01 DIAGNOSIS — N63 Unspecified lump in unspecified breast: Secondary | ICD-10-CM

## 2023-07-01 DIAGNOSIS — R928 Other abnormal and inconclusive findings on diagnostic imaging of breast: Secondary | ICD-10-CM

## 2023-07-02 ENCOUNTER — Ambulatory Visit
Admission: RE | Admit: 2023-07-02 | Discharge: 2023-07-02 | Disposition: A | Discharge status: Home / Self Care | Payer: Medicare Other | Source: Ambulatory Visit | Attending: Family Medicine | Admitting: Family Medicine

## 2023-07-02 DIAGNOSIS — R928 Other abnormal and inconclusive findings on diagnostic imaging of breast: Secondary | ICD-10-CM | POA: Insufficient documentation

## 2023-07-02 DIAGNOSIS — N6489 Other specified disorders of breast: Secondary | ICD-10-CM | POA: Diagnosis not present

## 2023-07-02 DIAGNOSIS — N6325 Unspecified lump in the left breast, overlapping quadrants: Secondary | ICD-10-CM | POA: Diagnosis not present

## 2023-07-02 DIAGNOSIS — N63 Unspecified lump in unspecified breast: Secondary | ICD-10-CM | POA: Diagnosis not present

## 2023-07-02 HISTORY — PX: BREAST BIOPSY: SHX20

## 2023-07-02 MED ORDER — LIDOCAINE 1 % OPTIME INJ - NO CHARGE
5.0000 mL | Freq: Once | INTRAMUSCULAR | Status: AC
  Administered 2023-07-02: 5 mL

## 2023-07-02 MED ORDER — LIDOCAINE-EPINEPHRINE 1 %-1:100000 IJ SOLN
8.0000 mL | Freq: Once | INTRAMUSCULAR | Status: AC
  Administered 2023-07-02: 8 mL

## 2023-07-04 ENCOUNTER — Encounter: Payer: Self-pay | Admitting: *Deleted

## 2023-07-04 DIAGNOSIS — C50919 Malignant neoplasm of unspecified site of unspecified female breast: Secondary | ICD-10-CM

## 2023-07-04 NOTE — Progress Notes (Unsigned)
Received referral for newly diagnosed breast cancer from [facility].  Navigation initiated.  She will see Dr. Smith Robert on Monday at 2:30.   Referral sent to [facility], their office will call with an appointment.

## 2023-07-07 ENCOUNTER — Other Ambulatory Visit: Payer: Self-pay | Admitting: *Deleted

## 2023-07-07 ENCOUNTER — Encounter: Payer: Self-pay | Admitting: Oncology

## 2023-07-07 ENCOUNTER — Encounter: Payer: Self-pay | Admitting: *Deleted

## 2023-07-07 ENCOUNTER — Inpatient Hospital Stay: Payer: Medicare Other

## 2023-07-07 ENCOUNTER — Inpatient Hospital Stay: Payer: Medicare Other | Attending: Oncology | Admitting: Oncology

## 2023-07-07 VITALS — BP 131/77 | HR 96 | Temp 98.2°F | Resp 18 | Ht 62.0 in | Wt 159.6 lb

## 2023-07-07 DIAGNOSIS — C50412 Malignant neoplasm of upper-outer quadrant of left female breast: Secondary | ICD-10-CM

## 2023-07-07 DIAGNOSIS — C50919 Malignant neoplasm of unspecified site of unspecified female breast: Secondary | ICD-10-CM

## 2023-07-07 DIAGNOSIS — F17210 Nicotine dependence, cigarettes, uncomplicated: Secondary | ICD-10-CM | POA: Insufficient documentation

## 2023-07-07 DIAGNOSIS — Z803 Family history of malignant neoplasm of breast: Secondary | ICD-10-CM | POA: Diagnosis not present

## 2023-07-07 DIAGNOSIS — C50812 Malignant neoplasm of overlapping sites of left female breast: Secondary | ICD-10-CM | POA: Diagnosis not present

## 2023-07-07 DIAGNOSIS — Z170 Estrogen receptor positive status [ER+]: Secondary | ICD-10-CM

## 2023-07-07 DIAGNOSIS — Z7189 Other specified counseling: Secondary | ICD-10-CM | POA: Diagnosis not present

## 2023-07-07 NOTE — Progress Notes (Signed)
Accompanied patient and family to initial medical oncology appointment.   Reviewed Breast Cancer treatment handbook.   Care plan summary given to patient.   Reviewed outreach programs and cancer center services.   

## 2023-07-08 ENCOUNTER — Ambulatory Visit: Payer: Medicare Other | Admitting: Surgery

## 2023-07-08 ENCOUNTER — Encounter: Payer: Self-pay | Admitting: *Deleted

## 2023-07-08 ENCOUNTER — Other Ambulatory Visit: Payer: Self-pay | Admitting: Surgery

## 2023-07-08 ENCOUNTER — Encounter: Payer: Self-pay | Admitting: Surgery

## 2023-07-08 ENCOUNTER — Ambulatory Visit: Payer: Self-pay | Admitting: Surgery

## 2023-07-08 ENCOUNTER — Other Ambulatory Visit: Payer: Self-pay

## 2023-07-08 VITALS — BP 123/78 | HR 102 | Temp 98.2°F | Ht 62.0 in | Wt 157.0 lb

## 2023-07-08 DIAGNOSIS — C50812 Malignant neoplasm of overlapping sites of left female breast: Secondary | ICD-10-CM

## 2023-07-08 DIAGNOSIS — C50912 Malignant neoplasm of unspecified site of left female breast: Secondary | ICD-10-CM

## 2023-07-08 DIAGNOSIS — Z170 Estrogen receptor positive status [ER+]: Secondary | ICD-10-CM

## 2023-07-08 NOTE — Progress Notes (Signed)
Patient notified that HER2 results came back negative.   Her breast MRI has also been scheduled for tomorrow.

## 2023-07-08 NOTE — H&P (View-Only) (Signed)
Patient ID: Stephanie Hess, female   DOB: Sep 14, 1951, 72 y.o.   MRN: [id number]  Chief Complaint: Invasive breast cancer, left breast.  History of Present Illness Stephanie Hess is a 72 y.o. female with a screening mammography detected left breast lesion, ultrasound-guided biopsy obtained.  Ultrasound revealed normal left axillary lymph nodes.  She has a paternal aunt with breast cancer, otherwise no family history.  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  She was pregnant 4 times with 2 ectopic pregnancies and 2 deliveries.  Her first pregnancy was at the age of 9.  She has never felt a breast mass, had nipple discharge, noted any breast skin changes or breast pain.  Past Medical History Past Medical History:  Diagnosis Date   Cancer (HCC)    COPD (chronic obstructive pulmonary disease) (HCC)    Diverticula of colon    GERD (gastroesophageal reflux disease)    Hyperlipidemia    Renal insufficiency    2009 or 2010      Past Surgical History:  Procedure Laterality Date   APPENDECTOMY     BREAST BIOPSY Left 07/02/2023   [place] LT BREAST BX W LOC DEV 1ST LESION IMG BX SPEC US GUIDE 07/02/2023 [facility]   CESAREAN SECTION     ECTOPIC PREGNANCY SURGERY      Allergies  Allergen Reactions   Penicillins Nausea And Vomiting and Other (See Comments)    Has patient had a PCN reaction causing immediate rash, facial/tongue/throat swelling, SOB or lightheadedness with hypotension: No Has patient had a PCN reaction causing severe rash involving mucus membranes or skin necrosis: No Has patient had a PCN reaction that required hospitalization: No Has patient had a PCN reaction occurring within the last 10 years: Yes If all of the above answers are "NO", then may proceed with Cephalosporin use.    Shellfish Allergy Nausea And Vomiting   Sulfa Antibiotics Rash and Other (See Comments)    Other reaction(s): Unknown   Clarithromycin Rash and Nausea Only    rash   Amoxicillin-Pot Clavulanate  Nausea And Vomiting   Iodinated Contrast Media Other (See Comments)    acute renal insufficiency acute renal insufficiency   Atorvastatin Rash   Macrobid [Nitrofurantoin] Rash    Current Outpatient Medications  Medication Sig Dispense Refill   budesonide (PULMICORT) 0.5 MG/2ML nebulizer solution Take 2 mLs by nebulization 2 (two) times daily.     busPIRone (BUSPAR) 10 MG tablet TAKE 1/2 TO 1 TABLET UP TO THREE TIMES DAILY AS NEEDED FOR ANXIETY 270 tablet 2   formoterol (PERFOROMIST) 20 MCG/2ML nebulizer solution INHALE 2 ML (20 MCG TOTAL) BY NEBULIZATION TWO (2) TIMES A DAY.     ipratropium (ATROVENT) 0.02 % nebulizer solution Take 2.5 mLs (0.5 mg total) by nebulization 4 (four) times daily. 75 mL 3   levalbuterol (XOPENEX HFA) 45 MCG/ACT inhaler INHALE 1 PUFF INTO THE LUNGS EVERY 6 HOURS AS NEEDED FOR WHEEZING. 30 each 2   LORazepam (ATIVAN) 0.5 MG tablet TAKE 1/2 TO 1 TABLET TWICE DAILY AS NEEDED FOR ANXIETY 30 tablet 1   OXYGEN Inhale into the lungs. 2 litre at night     predniSONE (DELTASONE) 10 MG tablet Take 10 mg by mouth daily.     rosuvastatin (CRESTOR) 5 MG tablet Take 1 tablet (5 mg total) by mouth daily. 90 tablet 3   traZODone (DESYREL) 50 MG tablet Take 0.5-1 tablets (25-50 mg total) by mouth at bedtime as needed. for sleep 90 tablet 1  No current facility-administered medications for this visit.    Family History Family History  Problem Relation Age of Onset   Breast cancer Paternal Aunt    Heart attack Mother       Social History Social History   Tobacco Use   Smoking status: Every Day    Current packs/day: 0.50    Average packs/day: 0.5 packs/day for 50.0 years (25.0 ttl pk-yrs)    Types: Cigarettes    Passive exposure: Past   Smokeless tobacco: Never  Vaping Use   Vaping status: Never Used  Substance Use Topics   Alcohol use: Yes    Comment: social   Drug use: No        Review of Systems  Constitutional: Negative.   HENT:  Positive for  tinnitus.   Eyes: Negative.   Respiratory:  Positive for cough, sputum production and shortness of breath.   Cardiovascular: Negative.   Gastrointestinal: Negative.   Genitourinary: Negative.   Skin: Negative.   Neurological: Negative.   Psychiatric/Behavioral: Negative.       Physical Exam Blood pressure 123/78, pulse (!) 102, temperature 98.2 F (36.8 C), height 5\' 2"  (1.575 m), weight 157 lb (71.2 kg), SpO2 95%. Last Weight  Most recent update: 07/08/2023 11:08 AM    Weight  71.2 kg (157 lb)             CONSTITUTIONAL: Well developed, and nourished, appropriately responsive and aware without distress.   EYES: Sclera non-icteric.   EARS, NOSE, MOUTH AND THROAT:  The oropharynx is clear. Oral mucosa is pink and moist.   Hearing is intact to voice.  NECK: Trachea is midline, and there is no jugular venous distension.  LYMPH NODES:  Lymph nodes in the neck are not appreciated. RESPIRATORY:  Lungs are clear, and breath sounds are equal bilaterally.  Normal respiratory effort without pathologic use of accessory muscles. CARDIOVASCULAR: Heart is regular in rate and rhythm.   Well perfused.  GI: The abdomen is  soft, nontender, and nondistended.  MUSCULOSKELETAL:  Symmetrical muscle tone appreciated in all four extremities.    SKIN: Skin turgor is normal. No pathologic skin lesions appreciated.  NEUROLOGIC:  Motor and sensation appear grossly normal.  Cranial nerves are grossly without defect. PSYCH:  Alert and oriented to person, place and time. Affect is appropriate for situation.  Data Reviewed I have personally reviewed what is currently available of the patient's imaging, recent labs and medical records.   Labs:     Latest Ref Rng & Units 05/23/2023   10:57 AM 07/14/2021    5:15 AM 07/13/2021    2:00 AM  CBC  WBC 3.4 - 10.8 x10E3/uL 9.2  16.7  13.1   Hemoglobin 11.1 - 15.9 g/dL 16.1  09.6  04.5   Hematocrit 34.0 - 46.6 % 38.5  41.8  38.7   Platelets 150 - 450 x10E3/uL 330   337  311       Latest Ref Rng & Units 05/23/2023   10:57 AM 07/14/2021    5:15 AM 07/13/2021    2:00 AM  CMP  Glucose 70 - 99 mg/dL 409  811  914   BUN 8 - 27 mg/dL 11  14  10    Creatinine 0.57 - 1.00 mg/dL 7.82  9.56  2.13   Sodium 134 - 144 mmol/L 141  140  138   Potassium 3.5 - 5.2 mmol/L 4.2  4.7  4.2   Chloride 96 - 106 mmol/L 106  108  108   CO2 20 - 29 mmol/L 19  22  22    Calcium 8.7 - 10.3 mg/dL 9.8  9.7  9.8   Total Protein 6.0 - 8.5 g/dL 6.9     Total Bilirubin 0.0 - 1.2 mg/dL <4.0     Alkaline Phos 44 - 121 IU/L 63     AST 0 - 40 IU/L 28     ALT 0 - 32 IU/L 40      FINAL DIAGNOSIS Diagnosis Breast, left, needle core biopsy, 12:00 5 cmfn mass, ribbon clip - INVASIVE LOBULAR CARCINOMA. - TUBULE FORMATION: SCORE 3 - NUCLEAR PLEOMORPHISM: SCORE 2 - MITOTIC COUNT: SCORE 1 - TOTAL SCORE: 6 - OVERALL GRADE: 2 - LYMPHOVASCULAR INVASION: NOT IDENTIFIED - CANCER LENGTH: 9 MM - CALCIFICATIONS: PRESENT - LOBULAR CARCINOMA IN SITU: PRESENT - DUCTAL CARCINOMA IN SITU: NOT IDENTIFIED - SEE NOTE Diagnosis Note An e-cadherin stain was performed and demonstrates absence of membraneous staining in the invasive carcinoma. The morphologic findings and pattern of immunoreactivity support the diagnosis of invasive lobular carcinoma. Stain controls worked appropriately. These results were communicated to Randa Lynn, RN in the [place] on 07/03/2023. ER, PR, and HER2 will be performed on block 1A and reported in an addendum. This case underwent intradepartmental consultation and Dr. Corey Harold concurs with the interpretation.  1A-Breast,left,needle core biopsy FLUORESCENCE IN-SITU HYBRIDIZATION Results: GROUP 5: HER2 **NEGATIVE** Equivocal form of amplification of the HER2 gene was detected in the IHC 2+ tissue sample received from this individual. HER2 FISH was performed by a technologist and cell imaging and analysis on the [employer]. RATIO OF HER2/CEN17 SIGNALS  1.00 AVERAGE HER2 COPY NUMBER PER CELL 2.15 The ratio of HER2/CEN 17 is within the range < 2.0 of HER2/CEN 17 and a copy number of HER2 signals per cell is <4.0. Arch Pathol Lab Med 1:1,2018 Jerene Bears MD Pathologist, Electronic Signature ( Signed 07/07/2023) Breast, left, needle core biopsy, 12:00 5 cmfn mass, ribbon clip PROGNOSTIC INDICATORS Results: IMMUNOHISTOCHEMICAL AND MORPHOMETRIC ANALYSIS PERFORMED MANUALLY The tumor cells are equivocal for Her2 (2+). Her2 by FISH will be performed and the results reported separately. Estrogen Receptor: 100%, POSITIVE, STRONG STAINING INTENSITY 2 of 3 FINAL for STEHANIE, KUKULKA [phone]) ADDITIONAL INFORMATION:(continued) Progesterone Receptor: 90%, POSITIVE, STRONG STAINING INTENSITY REFERENCE RANGE ESTROGEN RECEPTOR NEGATIVE 0% POSITIVE =>1% REFERENCE RANGE PROGESTERONE RECEPTOR NEGATIVE 0% POSITIVE =>1% All controls stained appropriately   Imaging: Radiological images reviewed:  CLINICAL DATA:  LEFT breast mass callback. Patient denies any history of significant trauma or surgery to the LEFT breast.   EXAM: DIGITAL DIAGNOSTIC UNILATERAL LEFT MAMMOGRAM WITH TOMOSYNTHESIS AND CAD; ULTRASOUND LEFT BREAST LIMITED   TECHNIQUE: Left digital diagnostic mammography and breast tomosynthesis was performed. The images were evaluated with computer-aided detection. ; Targeted ultrasound examination of the left breast was performed.   COMPARISON:  Previous exam(s).   ACR Breast Density Category b: There are scattered areas of fibroglandular density.   FINDINGS: Spot compression tomosynthesis views confirms are persistent developing asymmetry immediately posterior to a dense dystrophic calcification in the slightly upper central breast at middle depth. There is associated architectural distortion. No additional suspicious findings are noted.   Targeted ultrasound was performed of the LEFT breast. At 12 o'clock 5 cm from the  nipple, there is an irregular hypoechoic mass with indistinct margins. It measures 7 x 5 by 6 mm. There is an adjacent echogenic focus consistent with coarse calcification.   Targeted ultrasound performed of the LEFT axilla. No suspicious LEFT  axillary lymph nodes are visualized.   IMPRESSION: 1. There is an indeterminate 7 mm developing asymmetry/mass immediately adjacent to a dystrophic calcification. While this could reflect organizing fat necrosis, differential considerations include malignancy. Recommend ultrasound-guided biopsy for definitive characterization. 2. No suspicious LEFT axillary adenopathy.   RECOMMENDATION: LEFT breast ultrasound-guided biopsy x1   I have discussed the findings and recommendations with the patient. The biopsy procedure was discussed with the patient and questions were answered. Patient expressed their understanding of the biopsy recommendation. Patient will be scheduled for biopsy at her earliest convenience by the schedulers. Ordering provider will be notified. If applicable, a reminder letter will be sent to the patient regarding the next appointment.   BI-RADS CATEGORY  4: Suspicious.     Electronically Signed   By: Meda Klinefelter M.D.   On: 06/30/2023 14:02   Within last 24 hrs: No results found.  Assessment    Invasive lobular carcinoma (12:00, 5 cm from nipple) ER/PR positive, HER2 negative by FISH. Patient Active Problem List   Diagnosis Date Noted   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 [facility]) 07/08/2023   Aortic atherosclerosis (HCC) 05/23/2023   BMI 30.0-30.9,adult 03/07/2022   Generalized anxiety disorder 08/19/2021   Chronic obstructive pulmonary disease (HCC) 07/13/2021   Nicotine abuse 03/03/2018   Mild intermittent asthma without complication 03/03/2018   Pulmonary fibrosis (HCC) 03/03/2018   GERD (gastroesophageal reflux disease) 03/03/2018   Hyperlipidemia, mixed 05/15/2017   Vitamin D deficiency  05/15/2017    Plan    Scout tag localized left breast lumpectomy with sentinel lymph node biopsy.  I discussed the available options with the patient. The risk of recurrence is similar between mastectomy and lumpectomy with radiation.  I also discussed that given the small size of the cancer would recommend localization lumpectomy with radiation to follow.  I also discussed that we would need to do a sentinel lymph node biopsy to check the nodes.   Explained to the patient that after her surgical treatment additional treatment will depend on her prognostic indicators and stage.    I discussed risks of bleeding, infection, damage to surrounding tissues, having positive margins, needing further resection, damage to nerves causing arm numbness or difficulty raising arm, causing lymphedema in the arm; as well as anesthesia risks of MI, stroke, prolonged ventilation, pulmonary embolism, thrombosis and even death.   Patient was given the opportunity to ask questions and have them answered.  They would like to proceed with left breast Scout tag localized lumpectomy with sentinel lymph node biopsy.   Face-to-face time spent with the patient and accompanying care providers(if present) was 40 minutes, with more than 50% of the time spent counseling, educating, and coordinating care of the patient.    These notes generated with voice recognition software. I apologize for typographical errors.  Campbell Lerner M.D., FACS 07/08/2023, 2:04 PM

## 2023-07-08 NOTE — Patient Instructions (Signed)
We have spoken today about removing a lump in your breast. This will be done by Dr. Claudine Mouton at [facility].  You will most likely be able to leave the hospital several hours after your surgery. Rarely, a patient needs to stay over night but this is a possibility.  Plan to tentatively be off work for 1-2 weeks following the surgery and may return with approximately 2 more weeks of a lifting restriction, no greater than 15 lbs.  Please see your Blue surgery sheet for more information. Our surgery scheduler will call you to look at surgery dates and to go over information.   If you have FMLA or Disability paperwork that needs to be filled out, please have your company fax your paperwork to [phone] or you may drop this by either office. This paperwork will be filled out within 3 days after your surgery has been completed.  What is radio frequency localization of the breast?(RFID) RFID tag localization uses radiofrequency technology to accurately pinpoint the tumor. Seeing exactly where the tumor is before surgery helps surgeons more effectively remove the entire tumor and spare surrounding healthy breast tissue.   Lumpectomy   A lumpectomy is a form of "breast conserving" or "breast preservation" surgery. It may also be referred to as a partial mastectomy. During a lumpectomy, the portion of the breast that contains the cancerous tumor or breast mass (the lump) is removed. Some normal tissue around the lump may also be removed to make sure all of the tumor has been removed.  LET [facility] CARE PROVIDER KNOW ABOUT: Any allergies you have. All medicines you are taking, including vitamins, herbs, eye drops, creams, and over-the-counter medicines. Previous problems you or members of your family have had with the use of anesthetics. Any blood disorders you have. Previous surgeries you have had. Medical conditions you have. RISKS AND COMPLICATIONS Generally, this is a safe procedure. However,  problems can occur and include: Bleeding. Infection. Pain. Temporary swelling. Change in the shape of the breast, particularly if a large portion is removed. BEFORE THE PROCEDURE Ask your health care provider about changing or stopping your regular medicines. This is especially important if you are taking diabetes medicines or blood thinners. Do not eat or drink anything after midnight on the night before the procedure or as directed by your health care provider. Ask your health care provider if you can take a sip of water with any approved medicines. On the day of surgery, your health care provider will use a mammogram or ultrasound to locate and mark the tumor in your breast. These markings on your breast will show where the cut (incision) will be made. PROCEDURE  An IV tube will be put into one of your veins. You may be given medicine to help you relax before the surgery (sedative). You will be given one of the following: A medicine that numbs the area (local anesthetic). A medicine that makes you fall asleep (general anesthetic). Your health care provider will use a kind of electric scalpel that uses heat to minimize bleeding (electrocautery knife). A curved incision (like a smile or frown) that follows the natural curve of your breast is made, to allow for minimal scarring and better healing. The tumor will be removed with some of the surrounding tissue. This will be sent to the lab for analysis. Your health care provider may also remove your lymph nodes at this time if needed. Sometimes, but not always, a rubber tube called a drain will be  surgically inserted into your breast area or armpit to collect excess fluid that may accumulate in the space where the tumor was. This drain is connected to a plastic bulb on the outside of your body. This drain creates suction to help remove the fluid. The incisions will be closed with stitches (sutures). A bandage may be placed over the  incisions. AFTER THE PROCEDURE You will be taken to the recovery area. You will be given medicine for pain. A small rubber drain may be placed in the breast for 2-3 days to prevent a collection of blood (hematoma) from developing in the breast. You will be given instructions on caring for the drain before you go home. A pressure bandage (dressing) will be applied for 1-2 days to prevent bleeding. Ask your health care provider how to care for your bandage at home.   This information is not intended to replace advice given to you by your health care provider. Make sure you discuss any questions you have with your health care provider.   Document Released: 12/09/2006 Document Revised: 11/18/2014 Document Reviewed: 04/02/2013 Elsevier Interactive Patient Education [employer]

## 2023-07-08 NOTE — Progress Notes (Signed)
Patient ID: Stephanie Hess, female   DOB: Sep 14, 1951, 72 y.o.   MRN: [id number]  Chief Complaint: Invasive breast cancer, left breast.  History of Present Illness Stephanie Hess is a 72 y.o. female with a screening mammography detected left breast lesion, ultrasound-guided biopsy obtained.  Ultrasound revealed normal left axillary lymph nodes.  She has a paternal aunt with breast cancer, otherwise no family history.  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  She was pregnant 4 times with 2 ectopic pregnancies and 2 deliveries.  Her first pregnancy was at the age of 9.  She has never felt a breast mass, had nipple discharge, noted any breast skin changes or breast pain.  Past Medical History Past Medical History:  Diagnosis Date   Cancer (HCC)    COPD (chronic obstructive pulmonary disease) (HCC)    Diverticula of colon    GERD (gastroesophageal reflux disease)    Hyperlipidemia    Renal insufficiency    2009 or 2010      Past Surgical History:  Procedure Laterality Date   APPENDECTOMY     BREAST BIOPSY Left 07/02/2023   [place] LT BREAST BX W LOC DEV 1ST LESION IMG BX SPEC US GUIDE 07/02/2023 [facility]   CESAREAN SECTION     ECTOPIC PREGNANCY SURGERY      Allergies  Allergen Reactions   Penicillins Nausea And Vomiting and Other (See Comments)    Has patient had a PCN reaction causing immediate rash, facial/tongue/throat swelling, SOB or lightheadedness with hypotension: No Has patient had a PCN reaction causing severe rash involving mucus membranes or skin necrosis: No Has patient had a PCN reaction that required hospitalization: No Has patient had a PCN reaction occurring within the last 10 years: Yes If all of the above answers are "NO", then may proceed with Cephalosporin use.    Shellfish Allergy Nausea And Vomiting   Sulfa Antibiotics Rash and Other (See Comments)    Other reaction(s): Unknown   Clarithromycin Rash and Nausea Only    rash   Amoxicillin-Pot Clavulanate  Nausea And Vomiting   Iodinated Contrast Media Other (See Comments)    acute renal insufficiency acute renal insufficiency   Atorvastatin Rash   Macrobid [Nitrofurantoin] Rash    Current Outpatient Medications  Medication Sig Dispense Refill   budesonide (PULMICORT) 0.5 MG/2ML nebulizer solution Take 2 mLs by nebulization 2 (two) times daily.     busPIRone (BUSPAR) 10 MG tablet TAKE 1/2 TO 1 TABLET UP TO THREE TIMES DAILY AS NEEDED FOR ANXIETY 270 tablet 2   formoterol (PERFOROMIST) 20 MCG/2ML nebulizer solution INHALE 2 ML (20 MCG TOTAL) BY NEBULIZATION TWO (2) TIMES A DAY.     ipratropium (ATROVENT) 0.02 % nebulizer solution Take 2.5 mLs (0.5 mg total) by nebulization 4 (four) times daily. 75 mL 3   levalbuterol (XOPENEX HFA) 45 MCG/ACT inhaler INHALE 1 PUFF INTO THE LUNGS EVERY 6 HOURS AS NEEDED FOR WHEEZING. 30 each 2   LORazepam (ATIVAN) 0.5 MG tablet TAKE 1/2 TO 1 TABLET TWICE DAILY AS NEEDED FOR ANXIETY 30 tablet 1   OXYGEN Inhale into the lungs. 2 litre at night     predniSONE (DELTASONE) 10 MG tablet Take 10 mg by mouth daily.     rosuvastatin (CRESTOR) 5 MG tablet Take 1 tablet (5 mg total) by mouth daily. 90 tablet 3   traZODone (DESYREL) 50 MG tablet Take 0.5-1 tablets (25-50 mg total) by mouth at bedtime as needed. for sleep 90 tablet 1  No current facility-administered medications for this visit.    Family History Family History  Problem Relation Age of Onset   Breast cancer Paternal Aunt    Heart attack Mother       Social History Social History   Tobacco Use   Smoking status: Every Day    Current packs/day: 0.50    Average packs/day: 0.5 packs/day for 50.0 years (25.0 ttl pk-yrs)    Types: Cigarettes    Passive exposure: Past   Smokeless tobacco: Never  Vaping Use   Vaping status: Never Used  Substance Use Topics   Alcohol use: Yes    Comment: social   Drug use: No        Review of Systems  Constitutional: Negative.   HENT:  Positive for  tinnitus.   Eyes: Negative.   Respiratory:  Positive for cough, sputum production and shortness of breath.   Cardiovascular: Negative.   Gastrointestinal: Negative.   Genitourinary: Negative.   Skin: Negative.   Neurological: Negative.   Psychiatric/Behavioral: Negative.       Physical Exam Blood pressure 123/78, pulse (!) 102, temperature 98.2 F (36.8 C), height 5\' 2"  (1.575 m), weight 157 lb (71.2 kg), SpO2 95%. Last Weight  Most recent update: 07/08/2023 11:08 AM    Weight  71.2 kg (157 lb)             CONSTITUTIONAL: Well developed, and nourished, appropriately responsive and aware without distress.   EYES: Sclera non-icteric.   EARS, NOSE, MOUTH AND THROAT:  The oropharynx is clear. Oral mucosa is pink and moist.   Hearing is intact to voice.  NECK: Trachea is midline, and there is no jugular venous distension.  LYMPH NODES:  Lymph nodes in the neck are not appreciated. RESPIRATORY:  Lungs are clear, and breath sounds are equal bilaterally.  Normal respiratory effort without pathologic use of accessory muscles. CARDIOVASCULAR: Heart is regular in rate and rhythm.   Well perfused.  GI: The abdomen is  soft, nontender, and nondistended.  MUSCULOSKELETAL:  Symmetrical muscle tone appreciated in all four extremities.    SKIN: Skin turgor is normal. No pathologic skin lesions appreciated.  NEUROLOGIC:  Motor and sensation appear grossly normal.  Cranial nerves are grossly without defect. PSYCH:  Alert and oriented to person, place and time. Affect is appropriate for situation.  Data Reviewed I have personally reviewed what is currently available of the patient's imaging, recent labs and medical records.   Labs:     Latest Ref Rng & Units 05/23/2023   10:57 AM 07/14/2021    5:15 AM 07/13/2021    2:00 AM  CBC  WBC 3.4 - 10.8 x10E3/uL 9.2  16.7  13.1   Hemoglobin 11.1 - 15.9 g/dL 16.1  09.6  04.5   Hematocrit 34.0 - 46.6 % 38.5  41.8  38.7   Platelets 150 - 450 x10E3/uL 330   337  311       Latest Ref Rng & Units 05/23/2023   10:57 AM 07/14/2021    5:15 AM 07/13/2021    2:00 AM  CMP  Glucose 70 - 99 mg/dL 409  811  914   BUN 8 - 27 mg/dL 11  14  10    Creatinine 0.57 - 1.00 mg/dL 7.82  9.56  2.13   Sodium 134 - 144 mmol/L 141  140  138   Potassium 3.5 - 5.2 mmol/L 4.2  4.7  4.2   Chloride 96 - 106 mmol/L 106  108  108   CO2 20 - 29 mmol/L 19  22  22    Calcium 8.7 - 10.3 mg/dL 9.8  9.7  9.8   Total Protein 6.0 - 8.5 g/dL 6.9     Total Bilirubin 0.0 - 1.2 mg/dL <4.0     Alkaline Phos 44 - 121 IU/L 63     AST 0 - 40 IU/L 28     ALT 0 - 32 IU/L 40      FINAL DIAGNOSIS Diagnosis Breast, left, needle core biopsy, 12:00 5 cmfn mass, ribbon clip - INVASIVE LOBULAR CARCINOMA. - TUBULE FORMATION: SCORE 3 - NUCLEAR PLEOMORPHISM: SCORE 2 - MITOTIC COUNT: SCORE 1 - TOTAL SCORE: 6 - OVERALL GRADE: 2 - LYMPHOVASCULAR INVASION: NOT IDENTIFIED - CANCER LENGTH: 9 MM - CALCIFICATIONS: PRESENT - LOBULAR CARCINOMA IN SITU: PRESENT - DUCTAL CARCINOMA IN SITU: NOT IDENTIFIED - SEE NOTE Diagnosis Note An e-cadherin stain was performed and demonstrates absence of membraneous staining in the invasive carcinoma. The morphologic findings and pattern of immunoreactivity support the diagnosis of invasive lobular carcinoma. Stain controls worked appropriately. These results were communicated to Randa Lynn, RN in the [place] on 07/03/2023. ER, PR, and HER2 will be performed on block 1A and reported in an addendum. This case underwent intradepartmental consultation and Dr. Corey Harold concurs with the interpretation.  1A-Breast,left,needle core biopsy FLUORESCENCE IN-SITU HYBRIDIZATION Results: GROUP 5: HER2 **NEGATIVE** Equivocal form of amplification of the HER2 gene was detected in the IHC 2+ tissue sample received from this individual. HER2 FISH was performed by a technologist and cell imaging and analysis on the [employer]. RATIO OF HER2/CEN17 SIGNALS  1.00 AVERAGE HER2 COPY NUMBER PER CELL 2.15 The ratio of HER2/CEN 17 is within the range < 2.0 of HER2/CEN 17 and a copy number of HER2 signals per cell is <4.0. Arch Pathol Lab Med 1:1,2018 Jerene Bears MD Pathologist, Electronic Signature ( Signed 07/07/2023) Breast, left, needle core biopsy, 12:00 5 cmfn mass, ribbon clip PROGNOSTIC INDICATORS Results: IMMUNOHISTOCHEMICAL AND MORPHOMETRIC ANALYSIS PERFORMED MANUALLY The tumor cells are equivocal for Her2 (2+). Her2 by FISH will be performed and the results reported separately. Estrogen Receptor: 100%, POSITIVE, STRONG STAINING INTENSITY 2 of 3 FINAL for STEHANIE, KUKULKA [phone]) ADDITIONAL INFORMATION:(continued) Progesterone Receptor: 90%, POSITIVE, STRONG STAINING INTENSITY REFERENCE RANGE ESTROGEN RECEPTOR NEGATIVE 0% POSITIVE =>1% REFERENCE RANGE PROGESTERONE RECEPTOR NEGATIVE 0% POSITIVE =>1% All controls stained appropriately   Imaging: Radiological images reviewed:  CLINICAL DATA:  LEFT breast mass callback. Patient denies any history of significant trauma or surgery to the LEFT breast.   EXAM: DIGITAL DIAGNOSTIC UNILATERAL LEFT MAMMOGRAM WITH TOMOSYNTHESIS AND CAD; ULTRASOUND LEFT BREAST LIMITED   TECHNIQUE: Left digital diagnostic mammography and breast tomosynthesis was performed. The images were evaluated with computer-aided detection. ; Targeted ultrasound examination of the left breast was performed.   COMPARISON:  Previous exam(s).   ACR Breast Density Category b: There are scattered areas of fibroglandular density.   FINDINGS: Spot compression tomosynthesis views confirms are persistent developing asymmetry immediately posterior to a dense dystrophic calcification in the slightly upper central breast at middle depth. There is associated architectural distortion. No additional suspicious findings are noted.   Targeted ultrasound was performed of the LEFT breast. At 12 o'clock 5 cm from the  nipple, there is an irregular hypoechoic mass with indistinct margins. It measures 7 x 5 by 6 mm. There is an adjacent echogenic focus consistent with coarse calcification.   Targeted ultrasound performed of the LEFT axilla. No suspicious LEFT  axillary lymph nodes are visualized.   IMPRESSION: 1. There is an indeterminate 7 mm developing asymmetry/mass immediately adjacent to a dystrophic calcification. While this could reflect organizing fat necrosis, differential considerations include malignancy. Recommend ultrasound-guided biopsy for definitive characterization. 2. No suspicious LEFT axillary adenopathy.   RECOMMENDATION: LEFT breast ultrasound-guided biopsy x1   I have discussed the findings and recommendations with the patient. The biopsy procedure was discussed with the patient and questions were answered. Patient expressed their understanding of the biopsy recommendation. Patient will be scheduled for biopsy at her earliest convenience by the schedulers. Ordering provider will be notified. If applicable, a reminder letter will be sent to the patient regarding the next appointment.   BI-RADS CATEGORY  4: Suspicious.     Electronically Signed   By: Meda Klinefelter M.D.   On: 06/30/2023 14:02   Within last 24 hrs: No results found.  Assessment    Invasive lobular carcinoma (12:00, 5 cm from nipple) ER/PR positive, HER2 negative by FISH. Patient Active Problem List   Diagnosis Date Noted   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 [facility]) 07/08/2023   Aortic atherosclerosis (HCC) 05/23/2023   BMI 30.0-30.9,adult 03/07/2022   Generalized anxiety disorder 08/19/2021   Chronic obstructive pulmonary disease (HCC) 07/13/2021   Nicotine abuse 03/03/2018   Mild intermittent asthma without complication 03/03/2018   Pulmonary fibrosis (HCC) 03/03/2018   GERD (gastroesophageal reflux disease) 03/03/2018   Hyperlipidemia, mixed 05/15/2017   Vitamin D deficiency  05/15/2017    Plan    Scout tag localized left breast lumpectomy with sentinel lymph node biopsy.  I discussed the available options with the patient. The risk of recurrence is similar between mastectomy and lumpectomy with radiation.  I also discussed that given the small size of the cancer would recommend localization lumpectomy with radiation to follow.  I also discussed that we would need to do a sentinel lymph node biopsy to check the nodes.   Explained to the patient that after her surgical treatment additional treatment will depend on her prognostic indicators and stage.    I discussed risks of bleeding, infection, damage to surrounding tissues, having positive margins, needing further resection, damage to nerves causing arm numbness or difficulty raising arm, causing lymphedema in the arm; as well as anesthesia risks of MI, stroke, prolonged ventilation, pulmonary embolism, thrombosis and even death.   Patient was given the opportunity to ask questions and have them answered.  They would like to proceed with left breast Scout tag localized lumpectomy with sentinel lymph node biopsy.   Face-to-face time spent with the patient and accompanying care providers(if present) was 40 minutes, with more than 50% of the time spent counseling, educating, and coordinating care of the patient.    These notes generated with voice recognition software. I apologize for typographical errors.  Campbell Lerner M.D., FACS 07/08/2023, 2:04 PM

## 2023-07-09 ENCOUNTER — Ambulatory Visit
Admission: RE | Admit: 2023-07-09 | Discharge: 2023-07-09 | Disposition: A | Discharge status: Home / Self Care | Payer: Medicare Other | Source: Ambulatory Visit | Attending: Oncology | Admitting: Oncology

## 2023-07-09 ENCOUNTER — Encounter: Payer: Self-pay | Admitting: *Deleted

## 2023-07-09 DIAGNOSIS — C50919 Malignant neoplasm of unspecified site of unspecified female breast: Secondary | ICD-10-CM | POA: Diagnosis not present

## 2023-07-09 DIAGNOSIS — C50812 Malignant neoplasm of overlapping sites of left female breast: Secondary | ICD-10-CM | POA: Diagnosis not present

## 2023-07-09 DIAGNOSIS — C50312 Malignant neoplasm of lower-inner quadrant of left female breast: Secondary | ICD-10-CM | POA: Diagnosis not present

## 2023-07-09 MED ORDER — GADOBUTROL 1 MMOL/ML IV SOLN
7.0000 mL | Freq: Once | INTRAVENOUS | Status: AC | PRN
  Administered 2023-07-09: 7 mL via INTRAVENOUS

## 2023-07-10 NOTE — Progress Notes (Signed)
Discussed with Stephanie Hess recommendation for MR biopsy of left breast.  Orders entered.  Also gave appointments for [facility] eval and follow up with Dr. Smith Robert and Dr. Rushie Chestnut.

## 2023-07-11 ENCOUNTER — Other Ambulatory Visit: Payer: Self-pay | Admitting: Oncology

## 2023-07-11 DIAGNOSIS — R928 Other abnormal and inconclusive findings on diagnostic imaging of breast: Secondary | ICD-10-CM

## 2023-07-13 DIAGNOSIS — C50412 Malignant neoplasm of upper-outer quadrant of left female breast: Secondary | ICD-10-CM | POA: Insufficient documentation

## 2023-07-13 NOTE — Progress Notes (Signed)
Hematology/Oncology Consult note [facility] Telephone:([phone] Fax:([phone]  Patient Care Team: Melida Quitter, PA as PCP - General (Family Medicine) Hulen Luster, RN as Oncology Nurse Navigator   Name of the patient: Stephanie Hess  [id number]  [birth year]    Reason for referral- new diagnosis of breast cancer   Referring physician- Lieutenant Diego PA  Date of visit: 07/13/23   History of presenting illness- Patient is a 72 year old female who underwent a routine screening mammogram in August 2024 which showed a possible mass in the left breast.  This was followed by a diagnostic mammogram which showed a 7 x 5 x 6 mm mass at the 12 o'clock position of the left breast 5 cm from the nipple.  No suspicious left axillary lymph nodes.  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 Family History significant for paternal aunt with breast cancer..  Menarche at the age of 44.  G4, P2.  No prior abnormal breast biopsies.  ECOG PS- 1  Pain scale- 0   Review of systems- Review of Systems  Constitutional:  Negative for chills, fever, malaise/fatigue and weight loss.  HENT:  Negative for congestion, ear discharge and nosebleeds.   Eyes:  Negative for blurred vision.  Respiratory:  Negative for cough, hemoptysis, sputum production, shortness of breath and wheezing.   Cardiovascular:  Negative for chest pain, palpitations, orthopnea and claudication.  Gastrointestinal:  Negative for abdominal pain, blood in stool, constipation, diarrhea, heartburn, melena, nausea and vomiting.  Genitourinary:  Negative for dysuria, flank pain, frequency, hematuria and urgency.  Musculoskeletal:  Negative for back pain, joint pain and myalgias.  Skin:  Negative for rash.  Neurological:  Negative for dizziness, tingling, focal weakness,  seizures, weakness and headaches.  Endo/Heme/Allergies:  Does not bruise/bleed easily.  Psychiatric/Behavioral:  Negative for depression and suicidal ideas. The patient does not have insomnia.     Allergies  Allergen Reactions   Penicillins Nausea And Vomiting and Other (See Comments)    Has patient had a PCN reaction causing immediate rash, facial/tongue/throat swelling, SOB or lightheadedness with hypotension: No Has patient had a PCN reaction causing severe rash involving mucus membranes or skin necrosis: No Has patient had a PCN reaction that required hospitalization: No Has patient had a PCN reaction occurring within the last 10 years: Yes If all of the above answers are "NO", then may proceed with Cephalosporin use.    Shellfish Allergy Nausea And Vomiting   Sulfa Antibiotics Rash and Other (See Comments)    Other reaction(s): Unknown   Clarithromycin Rash and Nausea Only    rash   Amoxicillin-Pot Clavulanate Nausea And Vomiting   Iodinated Contrast Media Other (See Comments)    acute renal insufficiency acute renal insufficiency   Atorvastatin Rash   Macrobid [Nitrofurantoin] Rash    Patient Active Problem List   Diagnosis Date Noted   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) 07/08/2023   Aortic atherosclerosis (HCC) 05/23/2023   BMI 30.0-30.9,adult 03/07/2022   Generalized anxiety disorder 08/19/2021   Chronic obstructive pulmonary disease (HCC) 07/13/2021   Nicotine abuse 03/03/2018   Mild intermittent asthma without complication 03/03/2018   Pulmonary fibrosis (HCC) 03/03/2018   GERD (gastroesophageal reflux disease) 03/03/2018   Hyperlipidemia, mixed 05/15/2017   Vitamin D deficiency 05/15/2017     Past Medical History:  Diagnosis Date  Cancer [facility])    COPD (chronic obstructive pulmonary disease) (HCC)    Diverticula of colon    GERD (gastroesophageal reflux disease)    Hyperlipidemia    Renal insufficiency    2009 or 2010     Past  Surgical History:  Procedure Laterality Date   APPENDECTOMY     BREAST BIOPSY Left 07/02/2023   [place] LT BREAST BX W LOC DEV 1ST LESION IMG BX SPEC US GUIDE 07/02/2023 [facility]   CESAREAN SECTION     ECTOPIC PREGNANCY SURGERY      Social History   Socioeconomic History   Marital status: Divorced    Spouse name: Not on file   Number of children: Not on file   Years of education: Not on file   Highest education level: Not on file  Occupational History   Not on file  Tobacco Use   Smoking status: Every Day    Current packs/day: 0.50    Average packs/day: 0.5 packs/day for 50.0 years (25.0 ttl pk-yrs)    Types: Cigarettes    Passive exposure: Past   Smokeless tobacco: Never  Vaping Use   Vaping status: Never Used  Substance and Sexual Activity   Alcohol use: Yes    Comment: social   Drug use: No   Sexual activity: Not Currently  Other Topics Concern   Not on file  Social History Narrative   Not on file   Social Determinants of Health   Financial Resource Strain: Low Risk  (11/18/2022)   Overall Financial Resource Strain (CARDIA)    Difficulty of Paying Living Expenses: Not hard at all  Food Insecurity: No Food Insecurity (11/18/2022)   Hunger Vital Sign    Worried About Running Out of Food in the Last Year: Never true    Ran Out of Food in the Last Year: Never true  Transportation Needs: No Transportation Needs (11/18/2022)   PRAPARE - Administrator, Civil Service (Medical): No    Lack of Transportation (Non-Medical): No  Physical Activity: Inactive (11/18/2022)   Exercise Vital Sign    Days of Exercise per Week: 0 days    Minutes of Exercise per Session: 0 min  Stress: No Stress Concern Present (11/18/2022)   [employer] of Occupational Health - Occupational Stress Questionnaire    Feeling of Stress : Not at all  Social Connections: Socially Isolated (11/18/2022)   Social Connection and Isolation Panel [NHANES]    Frequency of Communication with  Friends and Family: More than three times a week    Frequency of Social Gatherings with Friends and Family: Not on file    Attends Religious Services: Never    Active Member of Clubs or Organizations: No    Attends Banker Meetings: Never    Marital Status: Divorced  Catering manager Violence: Not At Risk (11/18/2022)   Humiliation, Afraid, Rape, and Kick questionnaire    Fear of Current or Ex-Partner: No    Emotionally Abused: No    Physically Abused: No    Sexually Abused: No     Family History  Problem Relation Age of Onset   Breast cancer Paternal Aunt    Heart attack Mother      Current Outpatient Medications:    busPIRone (BUSPAR) 10 MG tablet, TAKE 1/2 TO 1 TABLET UP TO THREE TIMES DAILY AS NEEDED FOR ANXIETY, Disp: 270 tablet, Rfl: 2   formoterol (PERFOROMIST) 20 MCG/2ML nebulizer solution, INHALE 2 ML (20 MCG TOTAL) BY NEBULIZATION TWO (  2) TIMES A DAY., Disp: , Rfl:    ipratropium (ATROVENT) 0.02 % nebulizer solution, Take 2.5 mLs (0.5 mg total) by nebulization 4 (four) times daily., Disp: 75 mL, Rfl: 3   levalbuterol (XOPENEX HFA) 45 MCG/ACT inhaler, INHALE 1 PUFF INTO THE LUNGS EVERY 6 HOURS AS NEEDED FOR WHEEZING., Disp: 30 each, Rfl: 2   LORazepam (ATIVAN) 0.5 MG tablet, TAKE 1/2 TO 1 TABLET TWICE DAILY AS NEEDED FOR ANXIETY, Disp: 30 tablet, Rfl: 1   OXYGEN, Inhale into the lungs. 2 litre at night, Disp: , Rfl:    traZODone (DESYREL) 50 MG tablet, Take 0.5-1 tablets (25-50 mg total) by mouth at bedtime as needed. for sleep, Disp: 90 tablet, Rfl: 1   budesonide (PULMICORT) 0.5 MG/2ML nebulizer solution, Take 2 mLs by nebulization 2 (two) times daily., Disp: , Rfl:    predniSONE (DELTASONE) 10 MG tablet, Take 10 mg by mouth daily., Disp: , Rfl:    rosuvastatin (CRESTOR) 5 MG tablet, Take 1 tablet (5 mg total) by mouth daily., Disp: 90 tablet, Rfl: 3   Physical exam:  Vitals:   07/07/23 1440  BP: 131/77  Pulse: 96  Resp: 18  Temp: 98.2 F (36.8 C)   TempSrc: Tympanic  Weight: 159 lb 9.6 oz (72.4 kg)  Height: 5\' 2"  (1.575 m)   Physical Exam Cardiovascular:     Rate and Rhythm: Normal rate and regular rhythm.     Heart sounds: Normal heart sounds.  Pulmonary:     Effort: Pulmonary effort is normal.     Breath sounds: Normal breath sounds.  Abdominal:     General: Bowel sounds are normal.     Palpations: Abdomen is soft.  Skin:    General: Skin is warm and dry.  Neurological:     Mental Status: She is alert and oriented to person, place, and time.   Breast exam: No palpable masses in the right breast.  No palpable bilateral axillary adenopathy.  No distinct palpable mass in the left breast.       Latest Ref Rng & Units 05/23/2023   10:57 AM  CMP  Glucose 70 - 99 mg/dL 409   BUN 8 - 27 mg/dL 11   Creatinine 8.11 - 1.00 mg/dL 9.14   Sodium 782 - 956 mmol/L 141   Potassium 3.5 - 5.2 mmol/L 4.2   Chloride 96 - 106 mmol/L 106   CO2 20 - 29 mmol/L 19   Calcium 8.7 - 10.3 mg/dL 9.8   Total Protein 6.0 - 8.5 g/dL 6.9   Total Bilirubin 0.0 - 1.2 mg/dL <2.1   Alkaline Phos 44 - 121 IU/L 63   AST 0 - 40 IU/L 28   ALT 0 - 32 IU/L 40       Latest Ref Rng & Units 05/23/2023   10:57 AM  CBC  WBC 3.4 - 10.8 x10E3/uL 9.2   Hemoglobin 11.1 - 15.9 g/dL 30.8   Hematocrit 65.7 - 46.6 % 38.5   Platelets 150 - 450 x10E3/uL 330     No images are attached to the encounter.  MR BREAST BILATERAL W WO CONTRAST INC CAD  Result Date: 07/09/2023 CLINICAL DATA:  Recently diagnosed invasive lobular carcinoma of the left breast. Assess for extent of disease. EXAM: BILATERAL BREAST MRI WITH AND WITHOUT CONTRAST TECHNIQUE: Multiplanar, multisequence MR images of both breasts were obtained prior to and following the intravenous administration of 7 ml of Gadavist Three-dimensional MR images were rendered by post-processing of the original MR  data on an independent workstation. The three-dimensional MR images were interpreted, and findings are  reported in the following complete MRI report for this study. Three dimensional images were evaluated at the independent interpreting workstation using the DynaCAD thin client. COMPARISON:  Prior exams FINDINGS: Breast composition: b. Scattered fibroglandular tissue. Background parenchymal enhancement: Mild Right breast: No mass or abnormal enhancement. Left breast: There is an irregular enhancing mass with associated post biopsy clip susceptibility artifact in the central left breast, slightly medial to midline, measuring 12 x 11 x 12 mm, reflecting biopsy-proven invasive lobular carcinoma. There is a 4 mm enhancing mass in the inferior breast, near 6 o'clock, middle depth, seen on axial image 27, series 7, demonstrating a small component washout enhancement, but predominantly plateau enhancement. There are no other suspicious masses or areas of abnormal enhancement. Lymph nodes: No abnormal appearing lymph nodes. Ancillary findings:  None. IMPRESSION: 1. 1.2 cm irregular enhancing mass in the left breast reflecting the biopsy-proven lobular carcinoma. 2. Indeterminate 4 mm enhancing mass in the inferior left breast near 6 o'clock. No other evidence of left breast malignancy. 3. No evidence of right breast malignancy. 4. No evidence of axillary lymphadenopathy. RECOMMENDATION: 1. MRI guided core needle biopsy of the 4 mm mass in the inferior left breast 6 o'clock. This is best appreciated on image 27, series 7. BI-RADS CATEGORY  4: Suspicious. Electronically Signed   By: Amie Portland M.D.   On: 07/09/2023 15:49   [place] LT BREAST BX W LOC DEV 1ST LESION IMG BX SPEC US GUIDE  Addendum Date: 07/04/2023   ADDENDUM REPORT: 07/04/2023 12:54 ADDENDUM: PATHOLOGY revealed: A. Breast, LEFT, needle core biopsy, 12:00 5 cmfn mass (ribbon clip) - INVASIVE LOBULAR CARCINOMA. - OVERALL GRADE: 2 - LYMPHOVASCULAR INVASION: NOT IDENTIFIED. - CANCER LENGTH: 9 MM. - CALCIFICATIONS: PRESENT. - LOBULAR CARCINOMA IN SITU: PRESENT. -  DUCTAL CARCINOMA IN SITU: NOT IDENTIFIED. Pathology results are CONCORDANT with imaging findings, per Dr. Meda Klinefelter. Pathology results and recommendations were discussed with patient via telephone on 07/04/2023. Patient reported biopsy site doing well with no adverse symptoms, and only slight tenderness at the site. Post biopsy care instructions were reviewed, questions were answered and my direct phone number was provided. Patient was instructed to call [facility] for any additional questions or concerns related to biopsy site. RECOMMENDATIONS: 1. Surgical and oncological consultation. Request for surgical and oncological consultation relayed to Irving Shows RN at [facility] by Randa Lynn RN on 07/04/2023. 2. Consider pretreatment bilateral breast MRI with and without contrast to determine extent of breast disease given lobular features. Pathology results reported by Randa Lynn RN on 07/04/2023. Electronically Signed   By: Meda Klinefelter M.D.   On: 07/04/2023 12:54   Result Date: 07/04/2023 CLINICAL DATA:  Indeterminate LEFT breast mass. EXAM: ULTRASOUND GUIDED LEFT BREAST CORE NEEDLE BIOPSY COMPARISON:  Previous exam(s). PROCEDURE: I met with the patient and we discussed the procedure of ultrasound-guided biopsy, including benefits and alternatives. We discussed the high likelihood of a successful procedure. We discussed the risks of the procedure, including infection, bleeding, tissue injury, clip migration, and inadequate sampling. Informed written consent was given. The usual time-out protocol was performed immediately prior to the procedure. Lesion quadrant: Upper outer quadrant Using sterile technique and 1% lidocaine and 1% lidocaine with epinephrine as local anesthetic, under direct ultrasound visualization, a 14 gauge spring-loaded device was used to perform biopsy of a mass at 12 o'clock using a lateral approach. At the conclusion  of the procedure a RIBBON shaped  tissue marker clip was deployed into the biopsy cavity. Follow up 2 view mammogram was performed and dictated separately. IMPRESSION: Ultrasound guided biopsy of a LEFT breast mass. No apparent complications. Electronically Signed: By: Meda Klinefelter M.D. On: 07/02/2023 14:09   MM CLIP PLACEMENT LEFT  Result Date: 07/02/2023 CLINICAL DATA:  Status post ultrasound-guided biopsy EXAM: 3D DIAGNOSTIC LEFT MAMMOGRAM POST ULTRASOUND BIOPSY COMPARISON:  Previous exam(s). FINDINGS: 3D Mammographic images were obtained following ultrasound guided biopsy of a LEFT breast mass. The RIBBON biopsy marking clip is in expected position at the site of biopsy. IMPRESSION: Appropriate positioning of the RIBBON shaped biopsy marking clip at the site of biopsy in the upper LEFT breast. Final Assessment: Post Procedure Mammograms for Marker Placement Electronically Signed   By: Meda Klinefelter M.D.   On: 07/02/2023 14:08   MM 3D DIAGNOSTIC MAMMOGRAM UNILATERAL LEFT BREAST  Result Date: 06/30/2023 CLINICAL DATA:  LEFT breast mass callback. Patient denies any history of significant trauma or surgery to the LEFT breast. EXAM: DIGITAL DIAGNOSTIC UNILATERAL LEFT MAMMOGRAM WITH TOMOSYNTHESIS AND CAD; ULTRASOUND LEFT BREAST LIMITED TECHNIQUE: Left digital diagnostic mammography and breast tomosynthesis was performed. The images were evaluated with computer-aided detection. ; Targeted ultrasound examination of the left breast was performed. COMPARISON:  Previous exam(s). ACR Breast Density Category b: There are scattered areas of fibroglandular density. FINDINGS: Spot compression tomosynthesis views confirms are persistent developing asymmetry immediately posterior to a dense dystrophic calcification in the slightly upper central breast at middle depth. There is associated architectural distortion. No additional suspicious findings are noted. Targeted ultrasound was performed of the LEFT breast. At 12 o'clock 5 cm from the  nipple, there is an irregular hypoechoic mass with indistinct margins. It measures 7 x 5 by 6 mm. There is an adjacent echogenic focus consistent with coarse calcification. Targeted ultrasound performed of the LEFT axilla. No suspicious LEFT axillary lymph nodes are visualized. IMPRESSION: 1. There is an indeterminate 7 mm developing asymmetry/mass immediately adjacent to a dystrophic calcification. While this could reflect organizing fat necrosis, differential considerations include malignancy. Recommend ultrasound-guided biopsy for definitive characterization. 2. No suspicious LEFT axillary adenopathy. RECOMMENDATION: LEFT breast ultrasound-guided biopsy x1 I have discussed the findings and recommendations with the patient. The biopsy procedure was discussed with the patient and questions were answered. Patient expressed their understanding of the biopsy recommendation. Patient will be scheduled for biopsy at her earliest convenience by the schedulers. Ordering provider will be notified. If applicable, a reminder letter will be sent to the patient regarding the next appointment. BI-RADS CATEGORY  4: Suspicious. Electronically Signed   By: Meda Klinefelter M.D.   On: 06/30/2023 14:02   [place] LIMITED ULTRASOUND INCLUDING AXILLA LEFT BREAST   Result Date: 06/30/2023 CLINICAL DATA:  LEFT breast mass callback. Patient denies any history of significant trauma or surgery to the LEFT breast. EXAM: DIGITAL DIAGNOSTIC UNILATERAL LEFT MAMMOGRAM WITH TOMOSYNTHESIS AND CAD; ULTRASOUND LEFT BREAST LIMITED TECHNIQUE: Left digital diagnostic mammography and breast tomosynthesis was performed. The images were evaluated with computer-aided detection. ; Targeted ultrasound examination of the left breast was performed. COMPARISON:  Previous exam(s). ACR Breast Density Category b: There are scattered areas of fibroglandular density. FINDINGS: Spot compression tomosynthesis views confirms are persistent developing asymmetry  immediately posterior to a dense dystrophic calcification in the slightly upper central breast at middle depth. There is associated architectural distortion. No additional suspicious findings are noted. Targeted ultrasound was performed of the LEFT breast. At 12  o'clock 5 cm from the nipple, there is an irregular hypoechoic mass with indistinct margins. It measures 7 x 5 by 6 mm. There is an adjacent echogenic focus consistent with coarse calcification. Targeted ultrasound performed of the LEFT axilla. No suspicious LEFT axillary lymph nodes are visualized. IMPRESSION: 1. There is an indeterminate 7 mm developing asymmetry/mass immediately adjacent to a dystrophic calcification. While this could reflect organizing fat necrosis, differential considerations include malignancy. Recommend ultrasound-guided biopsy for definitive characterization. 2. No suspicious LEFT axillary adenopathy. RECOMMENDATION: LEFT breast ultrasound-guided biopsy x1 I have discussed the findings and recommendations with the patient. The biopsy procedure was discussed with the patient and questions were answered. Patient expressed their understanding of the biopsy recommendation. Patient will be scheduled for biopsy at her earliest convenience by the schedulers. Ordering provider will be notified. If applicable, a reminder letter will be sent to the patient regarding the next appointment. BI-RADS CATEGORY  4: Suspicious. Electronically Signed   By: Meda Klinefelter M.D.   On: 06/30/2023 14:02   MM 3D SCREENING MAMMOGRAM BILATERAL BREAST  Result Date: 06/25/2023 CLINICAL DATA:  Screening. EXAM: DIGITAL SCREENING BILATERAL MAMMOGRAM WITH TOMOSYNTHESIS AND CAD TECHNIQUE: Bilateral screening digital craniocaudal and mediolateral oblique mammograms were obtained. Bilateral screening digital breast tomosynthesis was performed. The images were evaluated with computer-aided detection. COMPARISON:  Previous exam(s). ACR Breast Density Category b:  There are scattered areas of fibroglandular density. FINDINGS: In the left breast, a possible mass warrants further evaluation. In the right breast, no findings suspicious for malignancy. IMPRESSION: Further evaluation is suggested for a possible mass in the left breast. RECOMMENDATION: Diagnostic mammogram and possibly ultrasound of the left breast. (Code:[id number]) The patient will be contacted regarding the findings, and additional imaging will be scheduled. BI-RADS CATEGORY  0: Incomplete: Need additional imaging evaluation. Electronically Signed   By: Annia Belt M.D.   On: 06/25/2023 17:03    Assessment and plan- Patient is a 72 y.o. female with newly diagnosed invasive lobular carcinoma of the left breast clinical prognostic stage Ia cT1b N0 M0 ER 100% positive/PR 90% positive HER2 negative here to discuss further management  Discussed the results of mammogram ultrasound and pathology with the patient in detail.  Patient was found to have a 7 mm breast mass at the 12 o'clock position of the left breast which was biopsy-proven invasive lobular carcinoma grade 2 strongly ER/PR positive and HER2 negative.  Given this is a lobular histology I would recommend bilateral breast MRI to see if there are any additional sites of disease.  I would recommend upfront lumpectomy at this time and sentinel lymph node biopsy.  Based on the results of final pathology we will be sending Oncotype testing to determine if she would require adjuvant chemotherapy or not.  Discussed what Oncotype testing is and how the results are interpreted.  The chances of her requiring adjuvant chemotherapy are low given the strongly ER/PR positive tumor with lobular histology.  Patient will benefit from adjuvant radiation following lumpectomy.  Follow-up with me to be decided based on MRI and lumpectomy findings.  I will also have her see radiation oncology after final pathology is back.  Given personal and family history of breast cancer I  am also sending of blood for genetic testing and she will be seen by genetic counseling as well.  Treatment will be given with a curative intent.   Cancer Staging  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 [facility]) Staging form: Breast, AJCC 8th Edition - Clinical  stage from 07/07/2023: Stage IA (cT1b, cN0, cM0, G2, ER+, PR+, HER2-) - Signed by Creig Hines, MD on 07/13/2023 Histologic grading system: 3 grade system     Thank you for this kind referral and the opportunity to participate in the care of this  Patient   Visit Diagnosis 1. Invasive carcinoma of breast (HCC)     Dr. Owens Shark, MD, MPH [facility] [phone] 07/13/2023

## 2023-07-15 ENCOUNTER — Telehealth: Payer: Self-pay | Admitting: Family Medicine

## 2023-07-15 DIAGNOSIS — R5381 Other malaise: Secondary | ICD-10-CM | POA: Diagnosis not present

## 2023-07-15 DIAGNOSIS — J449 Chronic obstructive pulmonary disease, unspecified: Secondary | ICD-10-CM | POA: Diagnosis not present

## 2023-07-15 DIAGNOSIS — J441 Chronic obstructive pulmonary disease with (acute) exacerbation: Secondary | ICD-10-CM | POA: Diagnosis not present

## 2023-07-15 NOTE — Telephone Encounter (Signed)
Patient has stated they have already met with Genetics Counselor at the [facility] location

## 2023-07-17 ENCOUNTER — Other Ambulatory Visit: Payer: Self-pay | Admitting: Body Imaging

## 2023-07-17 ENCOUNTER — Ambulatory Visit
Admission: RE | Admit: 2023-07-17 | Discharge: 2023-07-17 | Disposition: A | Discharge status: Home / Self Care | Payer: Medicare Other | Source: Ambulatory Visit | Attending: Oncology | Admitting: Oncology

## 2023-07-17 DIAGNOSIS — N6012 Diffuse cystic mastopathy of left breast: Secondary | ICD-10-CM | POA: Diagnosis not present

## 2023-07-17 DIAGNOSIS — R928 Other abnormal and inconclusive findings on diagnostic imaging of breast: Secondary | ICD-10-CM | POA: Diagnosis not present

## 2023-07-17 DIAGNOSIS — C50919 Malignant neoplasm of unspecified site of unspecified female breast: Secondary | ICD-10-CM

## 2023-07-17 DIAGNOSIS — N6032 Fibrosclerosis of left breast: Secondary | ICD-10-CM | POA: Diagnosis not present

## 2023-07-17 MED ORDER — GADOPICLENOL 0.5 MMOL/ML IV SOLN
7.0000 mL | Freq: Once | INTRAVENOUS | Status: AC | PRN
  Administered 2023-07-17: 7 mL via INTRAVENOUS

## 2023-07-18 ENCOUNTER — Telehealth: Payer: Self-pay

## 2023-07-18 NOTE — Telephone Encounter (Signed)
-----   Message from Creig Hines sent at 07/18/2023  9:59 AM EDT ----- Silva Bandy- please let patient know breast biopsy was benign. No cancer. Proceed with lumpectomy as planned.  Dr. Claudine Mouton- [place] Childes

## 2023-07-18 NOTE — Telephone Encounter (Signed)
Stephanie Hess was called and notified that her breast biopsy was benign, no cancer. She can proceed with her lumpectomy as planned. She has no further questions at this time.

## 2023-07-23 ENCOUNTER — Encounter: Payer: Self-pay | Admitting: Occupational Therapy

## 2023-07-23 ENCOUNTER — Ambulatory Visit: Payer: Medicare Other | Attending: Oncology | Admitting: Occupational Therapy

## 2023-07-23 DIAGNOSIS — R293 Abnormal posture: Secondary | ICD-10-CM | POA: Diagnosis not present

## 2023-07-23 NOTE — Therapy (Signed)
OUTPATIENT OCCUPATIONAL THERAPY BREAST CANCER BASELINE EVALUATION   Patient Name: Stephanie Hess MRN: [id number] DOB:[birth year], 72 y.o., female Today's Date: 07/23/2023  END OF SESSION:  OT End of Session - 07/23/23 1812     Visit Number 1    Number of Visits 2    Date for OT Re-Evaluation 09/17/23    OT Start Time 0930    OT Stop Time 1001    OT Time Calculation (min) 31 min    Activity Tolerance Patient tolerated treatment well    Behavior During Therapy [facility] for tasks assessed/performed             Past Medical History:  Diagnosis Date   Cancer (HCC)    COPD (chronic obstructive pulmonary disease) (HCC)    Diverticula of colon    GERD (gastroesophageal reflux disease)    Hyperlipidemia    Renal insufficiency    2009 or 2010   Past Surgical History:  Procedure Laterality Date   APPENDECTOMY     BREAST BIOPSY Left 07/02/2023   [place] LT BREAST BX W LOC DEV 1ST LESION IMG BX SPEC US GUIDE 07/02/2023 [facility]   CESAREAN SECTION     ECTOPIC PREGNANCY SURGERY     Patient Active Problem List   Diagnosis Date Noted   Malignant neoplasm of upper-outer quadrant of left breast in female, 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/2023   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) 07/08/2023   Aortic atherosclerosis (HCC) 05/23/2023   BMI 30.0-30.9,adult 03/07/2022   Generalized anxiety disorder 08/19/2021   Chronic obstructive pulmonary disease (HCC) 07/13/2021   Nicotine abuse 03/03/2018   Mild intermittent asthma without complication 03/03/2018   Pulmonary fibrosis (HCC) 03/03/2018   GERD (gastroesophageal reflux disease) 03/03/2018   Hyperlipidemia, mixed 05/15/2017   Vitamin D deficiency 05/15/2017    PCP:  Jairo Ben PA  REFERRING PROVIDER: DR Glade Stanford DIAG: 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   PERTINENT HISTORY:  Patient was diagnosed with right  breast cancer - plan is to have modify mastectomy/lumpectomy.   PATIENT GOALS:   reduce lymphedema risk and learn post op HEP.  PAIN:  Are you having pain? No  PRECAUTIONS: Active CA     HAND DOMINANCE: right  WEIGHT BEARING RESTRICTIONS: No  FALLS:  Has patient fallen in last 6 months? No  LIVING ENVIRONMENT: Patient lives with: family  OCCUPATION: Likes to cook , around the house doing tasks and watch tv     OBJECTIVE:  COGNITION: Overall cognitive status: Within functional limits for tasks assessed    POSTURE:  Forward head and rounded shoulders posture  UPPER EXTREMITY AROM/PROM: AROM WNL bilateral UE   CERVICAL AROM: All within normal limits:   UPPER EXTREMITY STRENGTH: 5/5 in all planes for bilateral UE   LYMPHEDEMA ASSESSMENTS:   LANDMARK RIGHT   eval  10 cm proximal to olecranon process   Olecranon process   10 cm proximal to ulnar styloid process   Just proximal to ulnar styloid process   Across hand at thumb web space   At base of 2nd digit   (Blank rows = not tested)  LANDMARK LEFT   eval  10 cm proximal to olecranon process   Olecranon process   10 cm proximal to ulnar styloid process   Just proximal to ulnar styloid process   Across hand at thumb web space   At base of 2nd digit   (Blank rows = not tested)  L-DEX LYMPHEDEMA SCREENING: L-dex score for baseline 1.1  L UE at risk and R hand dominant   The patient was assessed using the L-Dex machine today to produce a lymphedema index baseline score. The patient will be reassessed on a regular basis  (typically every 3 months) to obtain new L-Dex scores. If the score is > 6.5 points away from his/her baseline score indicating onset of subclinical lymphedema, it will be recommended to wear a compression garment for 4 weeks, 12 hours per day and then be reassessed. If the score continues to be > 6.5 points from baseline at reassessment, we will initiate lymphedema treatment. Assessing in this manner has a 95% rate of preventing clinically significant lymphedema.     PATIENT EDUCATION:  Education details: Lymphedema risk reduction and post op shoulder/posture HEP Person educated: Patient Education method: Explanation, Demonstration, Handout Education comprehension: Patient verbalized understanding and returned demonstration  HOME EXERCISE PROGRAM: Patient was instructed today in a home exercise program today for post op shoulder range of motion. These included active assist shoulder flexion in sitting/supine, scapular retraction, wall walking/slides with shoulder abduction, and hands behind head external rotation in sitting /supine.  She was encouraged to do these twice a day, holding 3 seconds and repeating 5 times when permitted by her physician/surgeon.   ASSESSMENT:  CLINICAL IMPRESSION: Pt's multidisciplinary medical team has met to assess and determine a recommended treatment plan. She is planning to have L Lumpectomy on 08/06/23. She will benefit from a post op OT reassessment to determine needs and from L-Dex screens every 3 months for 2 years to detect subclinical lymphedema.  Pt will benefit from skilled therapeutic intervention to improve on the following deficits: Decreased knowledge of precautions and lymphedema education, impaired UE functional use, pain, decreased ROM, postural dysfunction.   OT treatment/interventions: ADL/self-care home management, pt/family education, therapeutic exercise,manual therapy  REHAB POTENTIAL: Good  CLINICAL DECISION MAKING:  Stable/uncomplicated  EVALUATION COMPLEXITY: Low   GOALS: Goals reviewed with patient? YES  LONG TERM GOALS: (STG=LTG)    Name Target Date Goal status  1 Pt will be able to verbalize understanding of pertinent lymphedema risk reduction practices relevant to her  dx specifically related to skin care.  Baseline:  No knowledge 07/23/23 Achieved at eval  2 Pt will be able to return demo and/or verbalize understanding of the post op HEP related to regaining shoulder ROM. Baseline:  No knowledge 07/23/23 Achieved at eval  3     4 Pt will demo she has regained full shoulder ROM and function post operatively compared to baselines.  Baseline: See objective measurements taken today. 4-6 wks s/p Initial    PLAN:  OT FREQUENCY/DURATION: EVAL and 1 follow up appointment.   PLAN FOR NEXT SESSION: will reassess 3-4 weeks post op to determine needs.   Patient will follow up at outpatient cancer rehab 3-4 weeks following surgery.  If the patient requires occupational therapy at that time, a specific plan will be dictated and sent to the referring physician for approval. The patient was educated today on appropriate basic range of motion exercises to begin post operatively and the importance of viewing the After Breast Cancer  video  following surgery.  Patient was educated today on lymphedema risk reduction practices as it pertains to recommendations that will benefit the patient immediately following surgery.  She/he verbalized good understanding.    Occupational Therapy Information for After Breast Cancer Surgery/Treatment:  Lymphedema is a swelling condition that you may be at risk for in your arm if you have lymph nodes removed from the armpit area.  After a sentinel node biopsy, the risk is approximately 5-9% and is higher after an axillary node dissection.  There is treatment available for this condition and it is not life-threatening.  Contact your physician or occupational therapist with  concerns. You may begin the 4 shoulder/posture exercises (see additional sheet) when permitted by your physician (typically a week after surgery).  If you have drains, you may need to wait until those are removed before beginning range of motion exercises.  A general recommendation is to not lift your arms above shoulder height until drains are removed.  These exercises should be done to your tolerance and gently.  This is not a "no pain/no gain" type of recovery so listen to your body and stretch into the range of motion that you can tolerate, stopping if you have pain.  If you are having immediate reconstruction, ask your plastic surgeon about doing exercises as he or she may want you to wait. We encourage you to watch the ABC (After Breast Cancer)  video. You will learn information related to lymphedema risk, prevention and treatment and additional exercises to regain mobility following surgery.  While undergoing any medical procedure or treatment, try to avoid blood pressure being taken or needle sticks from occurring on the arm on the side of cancer.   This recommendation begins after surgery and continues for the rest of your life.  This may help reduce your risk of getting lymphedema (swelling in your arm). An excellent resource for those seeking information on lymphedema is the National Lymphedema Network's web site. It can be accessed at www.lymphnet.org If you notice swelling in your hand, arm or breast at any time following surgery (even if it is many years from now), please contact your doctor or occupational therapist to discuss this.  Lymphedema can be treated at any time but it is easier for you if it is treated early on.  If you feel like your shoulder motion is not returning to normal in a reasonable amount of time, please contact your surgeon or occupational therapist.  [facility] [phone]. [address] [place], [place] [address]      Patient was  instructed today in a home exercise program today for post op shoulder range of motion. These included active assist shoulder flexion in sitting/supine, scapular retraction, wall slides/walking with shoulder abduction, and hands behind head external rotation.  She was encouraged to do these twice a day, holding 3 seconds and repeating 5 times when permitted by her physician.    Oletta Cohn, OTR/L,CLT 07/23/2023, 6:13 PM

## 2023-07-29 ENCOUNTER — Encounter
Admission: RE | Admit: 2023-07-29 | Discharge: 2023-07-29 | Disposition: A | Discharge status: Home / Self Care | Payer: Medicare Other | Source: Ambulatory Visit | Attending: Surgery | Admitting: Surgery

## 2023-07-29 ENCOUNTER — Other Ambulatory Visit: Payer: Medicare Other

## 2023-07-29 ENCOUNTER — Other Ambulatory Visit: Payer: Self-pay

## 2023-07-29 ENCOUNTER — Ambulatory Visit
Admission: RE | Admit: 2023-07-29 | Discharge: 2023-07-29 | Disposition: A | Discharge status: Home / Self Care | Payer: Medicare Other | Source: Ambulatory Visit | Attending: Surgery | Admitting: Surgery

## 2023-07-29 VITALS — Ht 62.0 in | Wt 160.0 lb

## 2023-07-29 DIAGNOSIS — C50912 Malignant neoplasm of unspecified site of left female breast: Secondary | ICD-10-CM | POA: Insufficient documentation

## 2023-07-29 DIAGNOSIS — I700 Atherosclerosis of aorta: Secondary | ICD-10-CM

## 2023-07-29 DIAGNOSIS — J8410 Pulmonary fibrosis, unspecified: Secondary | ICD-10-CM

## 2023-07-29 DIAGNOSIS — E782 Mixed hyperlipidemia: Secondary | ICD-10-CM

## 2023-07-29 MED ORDER — LIDOCAINE HCL 1 % IJ SOLN
10.0000 mL | Freq: Once | INTRAMUSCULAR | Status: AC
  Administered 2023-07-29: 10 mL
  Filled 2023-07-29: qty 10

## 2023-07-29 NOTE — Patient Instructions (Signed)
Your procedure is scheduled on: Wednesday 08/06/23 To find out your arrival time, please call [phone] between 1PM - 3PM on:   Tuesday 08/05/23 Report to the Registration Desk on the 1st floor of the [facility]. FREE Valet parking is available.  If your arrival time is 6:00 am, do not arrive before that time as the [facility] entrance doors do not open until 6:00 am.  REMEMBER: Instructions that are not followed completely may result in serious medical risk, up to and including death; or upon the discretion of your surgeon and anesthesiologist your surgery may need to be rescheduled.  Do not eat food after midnight the night before surgery.  No gum chewing or hard candies.  You may however, drink CLEAR liquids up to 2 hours before you are scheduled to arrive for your surgery. Do not drink anything within 2 hours of your scheduled arrival time.  Clear liquids include: - water  - apple juice without pulp - gatorade (not RED colors) - black coffee or tea (Do NOT add milk or creamers to the coffee or tea) Do NOT drink anything that is not on this list.  Type 1 and Type 2 diabetics should only drink water.  One week prior to surgery: Stop Anti-inflammatories (NSAIDS) such as Advil, Aleve, Ibuprofen, Motrin, Naproxen, Naprosyn and Aspirin based products such as Excedrin, Goody's Powder, BC Powder. You may however, continue to take Tylenol if needed for pain up until the day of surgery.  Stop ANY OVER THE COUNTER supplements and vitamins until after surgery.  Continue taking all prescribed medications.   TAKE ONLY THESE MEDICATIONS THE MORNING OF SURGERY WITH A SIP OF WATER:  predniSONE (DELTASONE) 10 MG tablet  omeprazole (PRILOSEC) 20 MG capsule Antacid (take one the night before and one on the morning of surgery - helps to prevent nausea after surgery.) You may take your anxiety medication if needed  Use your nebulizer on the day of surgery and bring your inhaler to the  hospital to the hospital.  No Alcohol for 24 hours before or after surgery.  No Smoking including e-cigarettes for 24 hours before surgery.  No chewable tobacco products for at least 6 hours before surgery.  No nicotine patches on the day of surgery.  Do not use any "recreational" drugs for at least a week (preferably 2 weeks) before your surgery.  Please be advised that the combination of cocaine and anesthesia may have negative outcomes, up to and including death. If you test positive for cocaine, your surgery will be cancelled.  On the morning of surgery brush your teeth with toothpaste and water, you may rinse your mouth with mouthwash if you wish. Do not swallow any toothpaste or mouthwash.  Use CHG Soap or wipes as directed on instruction sheet.  Do not wear lotions, powders, or perfumes. No Deodorant  Do not shave body hair from the neck down 48 hours before surgery.  Wear comfortable clothing (specific to your surgery type) to the hospital.  Do not wear jewelry, make-up, hairpins, clips or nail polish.  For welded (permanent) jewelry: bracelets, anklets, waist bands, etc.  Please have this removed prior to surgery.  If it is not removed, there is a chance that hospital personnel will need to cut it off on the day of surgery. Contact lenses, hearing aids and dentures may not be worn into surgery.  Do not bring valuables to the hospital. [facility] is not responsible for any missing/lost belongings or valuables.  Notify your doctor if there is any change in your medical condition (cold, fever, infection).  If you are being discharged the day of surgery, you will not be allowed to drive home. You will need a responsible individual to drive you home and stay with you for 24 hours after surgery.   If you are taking public transportation, you will need to have a responsible individual with you.  If you are being admitted to the hospital overnight, leave your suitcase in the  car. After surgery it may be brought to your room.  In case of increased patient census, it may be necessary for you, the patient, to continue your postoperative care in the Same Day Surgery department.  After surgery, you can help prevent lung complications by doing breathing exercises.  Take deep breaths and cough every 1-2 hours. Your doctor may order a device called an Incentive Spirometer to help you take deep breaths. When coughing or sneezing, hold a pillow firmly against your incision with both hands. This is called "splinting." Doing this helps protect your incision. It also decreases belly discomfort.  Surgery Visitation Policy:  Patients undergoing a surgery or procedure may have two family members or support persons with them as long as the person is not COVID-19 positive or experiencing its symptoms.   Inpatient Visitation:    Visiting hours are 7 a.m. to 8 p.m. Up to four visitors are allowed at one time in a patient room. The visitors may rotate out with other people during the day. One designated support person (adult) may remain overnight.  Please call the Pre-admissions Testing Dept. at [phone] if you have any questions about these instructions.     Preparing for Surgery with CHLORHEXIDINE GLUCONATE (CHG) Soap  Chlorhexidine Gluconate (CHG) Soap  o An antiseptic cleaner that kills germs and bonds with the skin to continue killing germs even after washing  o Used for showering the night before surgery and morning of surgery  Before surgery, you can play an important role by reducing the number of germs on your skin.  CHG (Chlorhexidine gluconate) soap is an antiseptic cleanser which kills germs and bonds with the skin to continue killing germs even after washing.  Please do not use if you have an allergy to CHG or antibacterial soaps. If your skin becomes reddened/irritated stop using the CHG.  1. Shower the NIGHT BEFORE SURGERY and the MORNING OF SURGERY  with CHG soap.  2. If you choose to wash your hair, wash your hair first as usual with your normal shampoo.  3. After shampooing, rinse your hair and body thoroughly to remove the shampoo.  4. Use CHG as you would any other liquid soap. You can apply CHG directly to the skin and wash gently with a scrungie or a clean washcloth.  5. Apply the CHG soap to your body only from the neck down. Do not use on open wounds or open sores. Avoid contact with your eyes, ears, mouth, and genitals (private parts). Wash face and genitals (private parts) with your normal soap.  6. Wash thoroughly, paying special attention to the area where your surgery will be performed.  7. Thoroughly rinse your body with warm water.  8. Do not shower/wash with your normal soap after using and rinsing off the CHG soap.  9. Pat yourself dry with a clean towel.  10. Wear clean pajamas to bed the night before surgery.  12. Place clean sheets on your bed the night of your  first shower and do not sleep with pets.  13. Shower again with the CHG soap on the day of surgery prior to arriving at the hospital.  14. Do not apply any deodorants/lotions/powders.  15. Please wear clean clothes to the hospital.

## 2023-07-30 ENCOUNTER — Encounter: Payer: Self-pay | Admitting: Urgent Care

## 2023-07-30 ENCOUNTER — Encounter
Admission: RE | Admit: 2023-07-30 | Discharge: 2023-07-30 | Disposition: A | Discharge status: Home / Self Care | Payer: Medicare Other | Source: Ambulatory Visit | Attending: Surgery | Admitting: Surgery

## 2023-07-30 DIAGNOSIS — R9431 Abnormal electrocardiogram [ECG] [EKG]: Secondary | ICD-10-CM | POA: Diagnosis not present

## 2023-07-30 DIAGNOSIS — I700 Atherosclerosis of aorta: Secondary | ICD-10-CM | POA: Diagnosis not present

## 2023-07-30 DIAGNOSIS — Z01818 Encounter for other preprocedural examination: Secondary | ICD-10-CM | POA: Diagnosis present

## 2023-07-30 DIAGNOSIS — E782 Mixed hyperlipidemia: Secondary | ICD-10-CM | POA: Insufficient documentation

## 2023-07-30 DIAGNOSIS — Z01810 Encounter for preprocedural cardiovascular examination: Secondary | ICD-10-CM | POA: Diagnosis not present

## 2023-07-30 DIAGNOSIS — J8410 Pulmonary fibrosis, unspecified: Secondary | ICD-10-CM | POA: Insufficient documentation

## 2023-08-04 NOTE — Progress Notes (Signed)
  Perioperative Services Pre-Admission/Anesthesia Testing     Date: 08/04/23  Name: Stephanie Hess MRN:   [id number]  Re: Clearance for surgery (pulmonary medicine)  Patient is scheduled to undergo a BREAST LUMPECTOMY WITH RADIOFREQUENCY TAG IDENTIFICATION; AXILLARY SENTINEL NODE BIOPSY on 08/06/2023 with Dr. 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 As a result, patient is on supplemental oxygen at bedtime. Patient is managed as an outpatient by pulmonary medicine at [facility]; follows with Dr. Sela Hua, MD. Given her cardiopulmonary diagnoses, input from pulmonary team was solicited prior to upcoming surgical procedure.   Per Dr. Dudley Major, "patient is optimized for surgery from a pulmonary perspective. She may proceed with planned surgical procedure with an overall MODERATE risk of perioperative pulmonary complications". Copy of signed clearance form placed on patient's OR chart for review by the surgical/anesthetic team on the day of his procedure.   Quentin Mulling, MSN, APRN, FNP-C, CEN [facility]  Perioperative Services Nurse Practitioner Phone: [phone] 08/04/23 1:46 PM  NOTE: This note has been prepared using Dragon dictation software. Despite my best ability to proofread, there is always the potential that unintentional transcriptional errors may still occur from this process.

## 2023-08-05 MED ORDER — BUPIVACAINE LIPOSOME 1.3 % IJ SUSP: 20.0000 mL | Freq: Once | INTRAMUSCULAR | Status: DC

## 2023-08-05 MED ORDER — CHLORHEXIDINE GLUCONATE 0.12 % MT SOLN
15.0000 mL | Freq: Once | OROMUCOSAL | Status: AC
  Administered 2023-08-06: 15 mL via OROMUCOSAL

## 2023-08-05 MED ORDER — CHLORHEXIDINE GLUCONATE CLOTH 2 % EX PADS
6.0000 | MEDICATED_PAD | Freq: Once | CUTANEOUS | Status: AC
  Administered 2023-08-06: 6 via TOPICAL

## 2023-08-05 MED ORDER — CHLORHEXIDINE GLUCONATE CLOTH 2 % EX PADS
6.0000 | MEDICATED_PAD | Freq: Once | CUTANEOUS | Status: AC
  Administered 2023-08-05: 6 via TOPICAL

## 2023-08-05 MED ORDER — GABAPENTIN 300 MG PO CAPS
300.0000 mg | ORAL_CAPSULE | ORAL | Status: AC
  Administered 2023-08-06: 300 mg via ORAL

## 2023-08-05 MED ORDER — ORAL CARE MOUTH RINSE: 15.0000 mL | Freq: Once | OROMUCOSAL | Status: AC

## 2023-08-05 MED ORDER — CEFAZOLIN SODIUM-DEXTROSE 2-4 GM/100ML-% IV SOLN
2.0000 g | INTRAVENOUS | Status: AC
  Administered 2023-08-06: 2 g via INTRAVENOUS

## 2023-08-05 MED ORDER — ACETAMINOPHEN 500 MG PO TABS
1000.0000 mg | ORAL_TABLET | ORAL | Status: AC
  Administered 2023-08-06: 1000 mg via ORAL

## 2023-08-05 MED ORDER — LACTATED RINGERS IV SOLN: INTRAVENOUS | Status: DC

## 2023-08-06 ENCOUNTER — Ambulatory Visit
Admission: RE | Admit: 2023-08-06 | Discharge: 2023-08-06 | Disposition: A | Discharge status: Home / Self Care | Payer: Medicare Other | Source: Ambulatory Visit | Attending: Surgery | Admitting: Surgery

## 2023-08-06 ENCOUNTER — Ambulatory Visit: Payer: Medicare Other | Admitting: Urgent Care

## 2023-08-06 ENCOUNTER — Other Ambulatory Visit: Payer: Self-pay

## 2023-08-06 ENCOUNTER — Encounter
Admission: RE | Disposition: A | Discharge status: Home / Self Care | Payer: Self-pay | Source: Home / Self Care | Attending: Surgery

## 2023-08-06 ENCOUNTER — Encounter: Payer: Self-pay | Admitting: Surgery

## 2023-08-06 ENCOUNTER — Ambulatory Visit
Admission: RE | Admit: 2023-08-06 | Discharge: 2023-08-06 | Disposition: A | Discharge status: Home / Self Care | Payer: Medicare Other | Attending: Surgery | Admitting: Surgery

## 2023-08-06 DIAGNOSIS — Z9981 Dependence on supplemental oxygen: Secondary | ICD-10-CM | POA: Diagnosis not present

## 2023-08-06 DIAGNOSIS — C50912 Malignant neoplasm of unspecified site of left female breast: Secondary | ICD-10-CM | POA: Insufficient documentation

## 2023-08-06 DIAGNOSIS — F17210 Nicotine dependence, cigarettes, uncomplicated: Secondary | ICD-10-CM | POA: Insufficient documentation

## 2023-08-06 DIAGNOSIS — Z803 Family history of malignant neoplasm of breast: Secondary | ICD-10-CM | POA: Diagnosis not present

## 2023-08-06 DIAGNOSIS — Z8759 Personal history of other complications of pregnancy, childbirth and the puerperium: Secondary | ICD-10-CM | POA: Diagnosis not present

## 2023-08-06 DIAGNOSIS — Z170 Estrogen receptor positive status [ER+]: Secondary | ICD-10-CM | POA: Insufficient documentation

## 2023-08-06 DIAGNOSIS — C50812 Malignant neoplasm of overlapping sites of left female breast: Secondary | ICD-10-CM | POA: Insufficient documentation

## 2023-08-06 DIAGNOSIS — J8410 Pulmonary fibrosis, unspecified: Secondary | ICD-10-CM | POA: Insufficient documentation

## 2023-08-06 DIAGNOSIS — N6323 Unspecified lump in the left breast, lower outer quadrant: Secondary | ICD-10-CM

## 2023-08-06 DIAGNOSIS — J4489 Other specified chronic obstructive pulmonary disease: Secondary | ICD-10-CM | POA: Insufficient documentation

## 2023-08-06 DIAGNOSIS — I700 Atherosclerosis of aorta: Secondary | ICD-10-CM | POA: Diagnosis not present

## 2023-08-06 HISTORY — PX: AXILLARY SENTINEL NODE BIOPSY: SHX5738

## 2023-08-06 HISTORY — PX: BREAST LUMPECTOMY WITH RADIOFREQUENCY TAG IDENTIFICATION: SHX6884

## 2023-08-06 HISTORY — PX: BREAST LUMPECTOMY: SHX2

## 2023-08-06 SURGERY — BREAST LUMPECTOMY WITH RADIOFREQUENCY TAG IDENTIFICATION
Anesthesia: General | Laterality: Left

## 2023-08-06 MED ORDER — PROPOFOL 10 MG/ML IV BOLUS
INTRAVENOUS | Status: DC | PRN
  Administered 2023-08-06: 50 mg via INTRAVENOUS
  Administered 2023-08-06: 150 mg via INTRAVENOUS

## 2023-08-06 MED ORDER — MIDAZOLAM HCL 2 MG/2ML IJ SOLN
INTRAMUSCULAR | Status: AC
  Filled 2023-08-06: qty 2

## 2023-08-06 MED ORDER — CHLORHEXIDINE GLUCONATE 0.12 % MT SOLN
OROMUCOSAL | Status: AC
  Filled 2023-08-06: qty 15

## 2023-08-06 MED ORDER — DEXAMETHASONE SODIUM PHOSPHATE 10 MG/ML IJ SOLN
INTRAMUSCULAR | Status: AC
  Filled 2023-08-06: qty 1

## 2023-08-06 MED ORDER — ONDANSETRON HCL 4 MG/2ML IJ SOLN
INTRAMUSCULAR | Status: AC
  Filled 2023-08-06: qty 2

## 2023-08-06 MED ORDER — ACETAMINOPHEN 10 MG/ML IV SOLN: 1000.0000 mg | Freq: Once | INTRAVENOUS | Status: DC | PRN

## 2023-08-06 MED ORDER — IPRATROPIUM-ALBUTEROL 0.5-2.5 (3) MG/3ML IN SOLN
3.0000 mL | Freq: Once | RESPIRATORY_TRACT | Status: AC
  Administered 2023-08-06: 3 mL via RESPIRATORY_TRACT

## 2023-08-06 MED ORDER — EPHEDRINE 5 MG/ML INJ
INTRAVENOUS | Status: AC
  Filled 2023-08-06: qty 5

## 2023-08-06 MED ORDER — LIDOCAINE HCL (PF) 2 % IJ SOLN
INTRAMUSCULAR | Status: AC
  Filled 2023-08-06: qty 5

## 2023-08-06 MED ORDER — ISOSULFAN BLUE 1 % ~~LOC~~ SOLN
SUBCUTANEOUS | Status: DC | PRN
  Administered 2023-08-06: 4 mL via SUBCUTANEOUS

## 2023-08-06 MED ORDER — FENTANYL CITRATE (PF) 100 MCG/2ML IJ SOLN
INTRAMUSCULAR | Status: DC | PRN
  Administered 2023-08-06 (×2): 50 ug via INTRAVENOUS

## 2023-08-06 MED ORDER — OXYCODONE HCL 5 MG PO TABS
5.0000 mg | ORAL_TABLET | Freq: Once | ORAL | Status: AC | PRN
  Administered 2023-08-06: 5 mg via ORAL

## 2023-08-06 MED ORDER — PHENYLEPHRINE 80 MCG/ML (10ML) SYRINGE FOR IV PUSH (FOR BLOOD PRESSURE SUPPORT)
PREFILLED_SYRINGE | INTRAVENOUS | Status: AC
  Filled 2023-08-06: qty 10

## 2023-08-06 MED ORDER — LIDOCAINE HCL (CARDIAC) PF 100 MG/5ML IV SOSY
PREFILLED_SYRINGE | INTRAVENOUS | Status: DC | PRN
  Administered 2023-08-06: 100 mg via INTRAVENOUS

## 2023-08-06 MED ORDER — ACETAMINOPHEN 500 MG PO TABS
ORAL_TABLET | ORAL | Status: AC
  Filled 2023-08-06: qty 2

## 2023-08-06 MED ORDER — ONDANSETRON HCL 4 MG/2ML IJ SOLN: 4.0000 mg | Freq: Once | INTRAMUSCULAR | Status: DC | PRN

## 2023-08-06 MED ORDER — GABAPENTIN 300 MG PO CAPS
ORAL_CAPSULE | ORAL | Status: AC
  Filled 2023-08-06: qty 1

## 2023-08-06 MED ORDER — IPRATROPIUM-ALBUTEROL 0.5-2.5 (3) MG/3ML IN SOLN
RESPIRATORY_TRACT | Status: AC
  Filled 2023-08-06: qty 3

## 2023-08-06 MED ORDER — HYDROCODONE-ACETAMINOPHEN 5-325 MG PO TABS: 1.0000 | ORAL_TABLET | Freq: Four times a day (QID) | ORAL | 0 refills | Status: DC | PRN

## 2023-08-06 MED ORDER — CEFAZOLIN SODIUM-DEXTROSE 2-4 GM/100ML-% IV SOLN
INTRAVENOUS | Status: AC
  Filled 2023-08-06: qty 100

## 2023-08-06 MED ORDER — EPHEDRINE SULFATE-NACL 50-0.9 MG/10ML-% IV SOSY
PREFILLED_SYRINGE | INTRAVENOUS | Status: DC | PRN
  Administered 2023-08-06 (×2): 5 mg via INTRAVENOUS
  Administered 2023-08-06: 10 mg via INTRAVENOUS
  Administered 2023-08-06: 5 mg via INTRAVENOUS

## 2023-08-06 MED ORDER — PHENYLEPHRINE 80 MCG/ML (10ML) SYRINGE FOR IV PUSH (FOR BLOOD PRESSURE SUPPORT)
PREFILLED_SYRINGE | INTRAVENOUS | Status: DC | PRN
  Administered 2023-08-06: 120 ug via INTRAVENOUS
  Administered 2023-08-06: 240 ug via INTRAVENOUS
  Administered 2023-08-06: 120 ug via INTRAVENOUS
  Administered 2023-08-06: 80 ug via INTRAVENOUS
  Administered 2023-08-06: 240 ug via INTRAVENOUS

## 2023-08-06 MED ORDER — STERILE WATER FOR IRRIGATION IR SOLN
Status: DC | PRN
  Administered 2023-08-06: 1

## 2023-08-06 MED ORDER — BUPIVACAINE-EPINEPHRINE (PF) 0.25% -1:200000 IJ SOLN
INTRAMUSCULAR | Status: DC | PRN
  Administered 2023-08-06: 30 mL

## 2023-08-06 MED ORDER — FENTANYL CITRATE (PF) 100 MCG/2ML IJ SOLN: 25.0000 ug | INTRAMUSCULAR | Status: DC | PRN

## 2023-08-06 MED ORDER — PHENYLEPHRINE HCL-NACL 20-0.9 MG/250ML-% IV SOLN
INTRAVENOUS | Status: DC | PRN
Start: 2023-08-06 — End: 2023-08-06
  Administered 2023-08-06: 30 ug/min via INTRAVENOUS

## 2023-08-06 MED ORDER — DEXAMETHASONE SODIUM PHOSPHATE 10 MG/ML IJ SOLN
INTRAMUSCULAR | Status: DC | PRN
  Administered 2023-08-06: 10 mg via INTRAVENOUS

## 2023-08-06 MED ORDER — ISOSULFAN BLUE 1 % ~~LOC~~ SOLN
SUBCUTANEOUS | Status: AC
  Filled 2023-08-06: qty 5

## 2023-08-06 MED ORDER — OXYCODONE HCL 5 MG/5ML PO SOLN: 5.0000 mg | Freq: Once | ORAL | Status: AC | PRN

## 2023-08-06 MED ORDER — OXYCODONE HCL 5 MG PO TABS
ORAL_TABLET | ORAL | Status: AC
  Filled 2023-08-06: qty 1

## 2023-08-06 MED ORDER — TECHNETIUM TC 99M TILMANOCEPT KIT
1.0000 | PACK | Freq: Once | INTRAVENOUS | Status: AC
  Administered 2023-08-06: 1.02 via INTRADERMAL

## 2023-08-06 MED ORDER — ONDANSETRON HCL 4 MG/2ML IJ SOLN
INTRAMUSCULAR | Status: DC | PRN
Start: 2023-08-06 — End: 2023-08-06
  Administered 2023-08-06: 4 mg via INTRAVENOUS

## 2023-08-06 MED ORDER — MIDAZOLAM HCL 2 MG/2ML IJ SOLN
INTRAMUSCULAR | Status: DC | PRN
  Administered 2023-08-06 (×2): 1 mg via INTRAVENOUS

## 2023-08-06 MED ORDER — PROPOFOL 10 MG/ML IV BOLUS
INTRAVENOUS | Status: AC
  Filled 2023-08-06: qty 20

## 2023-08-06 MED ORDER — DEXMEDETOMIDINE HCL IN NACL 80 MCG/20ML IV SOLN
INTRAVENOUS | Status: DC | PRN
Start: 2023-08-06 — End: 2023-08-06
  Administered 2023-08-06 (×2): 4 ug via INTRAVENOUS

## 2023-08-06 MED ORDER — DEXMEDETOMIDINE HCL IN NACL 80 MCG/20ML IV SOLN
INTRAVENOUS | Status: AC
  Filled 2023-08-06: qty 20

## 2023-08-06 MED ORDER — LACTATED RINGERS IV SOLN: INTRAVENOUS | Status: DC

## 2023-08-06 MED ORDER — FENTANYL CITRATE (PF) 100 MCG/2ML IJ SOLN
INTRAMUSCULAR | Status: AC
  Filled 2023-08-06: qty 2

## 2023-08-06 MED ORDER — BUPIVACAINE-EPINEPHRINE (PF) 0.25% -1:200000 IJ SOLN
INTRAMUSCULAR | Status: AC
  Filled 2023-08-06: qty 30

## 2023-08-06 SURGICAL SUPPLY — 44 items
ADH SKN CLS APL DERMABOND .7 (GAUZE/BANDAGES/DRESSINGS) ×1
APL PRP STRL LF DISP 70% ISPRP (MISCELLANEOUS) ×1
APPLIER CLIP 9.375 SM OPEN (CLIP) ×1
APR CLP SM 9.3 20 MLT OPN (CLIP) ×1
BLADE SURG 15 STRL LF DISP TIS (BLADE) ×1 IMPLANT
BLADE SURG 15 STRL SS (BLADE) ×1
CHLORAPREP W/TINT 26 (MISCELLANEOUS) ×1 IMPLANT
CLIP APPLIE 9.375 SM OPEN (CLIP) IMPLANT
CNTNR URN SCR LID CUP LEK RST (MISCELLANEOUS) IMPLANT
CONT SPEC 4OZ STRL OR WHT (MISCELLANEOUS)
COVER PROBE GAMMA FINDER SLV (MISCELLANEOUS) ×1 IMPLANT
DERMABOND ADVANCED .7 DNX12 (GAUZE/BANDAGES/DRESSINGS) ×1 IMPLANT
DEVICE DUBIN SPECIMEN MAMMOGRA (MISCELLANEOUS) ×1 IMPLANT
DISSECTOR SURG LIGASURE 21 (MISCELLANEOUS) IMPLANT
DRAPE LAPAROTOMY TRNSV 106X77 (MISCELLANEOUS) ×1 IMPLANT
ELECT CAUTERY BLADE TIP 2.5 (TIP) ×1
ELECT REM PT RETURN 9FT ADLT (ELECTROSURGICAL) ×1
ELECTRODE CAUTERY BLDE TIP 2.5 (TIP) ×1 IMPLANT
ELECTRODE REM PT RTRN 9FT ADLT (ELECTROSURGICAL) ×1 IMPLANT
GAUZE 4X4 16PLY ~~LOC~~+RFID DBL (SPONGE) ×1 IMPLANT
GLOVE ORTHO TXT STRL SZ7.5 (GLOVE) ×1 IMPLANT
GOWN STRL REUS W/ TWL LRG LVL3 (GOWN DISPOSABLE) ×1 IMPLANT
GOWN STRL REUS W/ TWL XL LVL3 (GOWN DISPOSABLE) ×1 IMPLANT
GOWN STRL REUS W/TWL LRG LVL3 (GOWN DISPOSABLE) ×1
GOWN STRL REUS W/TWL XL LVL3 (GOWN DISPOSABLE) ×1
KIT MARKER MARGIN INK (KITS) IMPLANT
KIT TURNOVER KIT A (KITS) ×1 IMPLANT
MANIFOLD NEPTUNE II (INSTRUMENTS) ×1 IMPLANT
NDL HYPO 22X1.5 SAFETY MO (MISCELLANEOUS) ×1 IMPLANT
NEEDLE HYPO 22X1.5 SAFETY MO (MISCELLANEOUS) ×1 IMPLANT
PACK BASIN MINOR ARMC (MISCELLANEOUS) ×1 IMPLANT
SHEATH BREAST BIOPSY SKIN MKR (SHEATH) ×1 IMPLANT
SPIKE FLUID TRANSFER (MISCELLANEOUS) ×1 IMPLANT
SUT MNCRL 4-0 (SUTURE) ×1
SUT MNCRL 4-0 27XMFL (SUTURE) ×1
SUT VIC AB 3-0 SH 27 (SUTURE) ×1
SUT VIC AB 3-0 SH 27X BRD (SUTURE) ×1 IMPLANT
SUTURE MNCRL 4-0 27XMF (SUTURE) ×1 IMPLANT
SYR 10ML LL (SYRINGE) ×1 IMPLANT
SYR 20ML LL LF (SYRINGE) ×1 IMPLANT
TRAP FLUID SMOKE EVACUATOR (MISCELLANEOUS) ×1 IMPLANT
TRAP NEPTUNE SPECIMEN COLLECT (MISCELLANEOUS) ×1 IMPLANT
WATER STERILE IRR 1000ML POUR (IV SOLUTION) ×1 IMPLANT
WATER STERILE IRR 500ML POUR (IV SOLUTION) ×1 IMPLANT

## 2023-08-06 NOTE — Anesthesia Preprocedure Evaluation (Addendum)
Anesthesia Evaluation  Patient identified by MRN, date of birth, ID band Patient awake    Reviewed: Allergy & Precautions, NPO status , Patient's Chart, lab work & pertinent test results  History of Anesthesia Complications Negative for: history of anesthetic complications  Airway Mallampati: IV   Neck ROM: Full    Dental  (+) Missing   Pulmonary asthma , COPD, Current Smoker (1/2 ppd) and Patient abstained from smoking. Pulmonary fibrosis    + wheezing      Cardiovascular Exercise Tolerance: Good Normal cardiovascular exam Rhythm:Regular Rate:Normal  ECG 07/30/23:  Normal sinus rhythm Nonspecific ST and T wave abnormality  Myocardial perfusion 06/06/21:  Normal Lexiscan infusion EKG  Normal myocardial perfusion without evidence of myocardial ischemia   Echo 06/06/21:  NORMAL LEFT VENTRICULAR SYSTOLIC FUNCTION  NORMAL RIGHT VENTRICULAR SYSTOLIC FUNCTION  MILD VALVULAR REGURGITATION  NO VALVULAR STENOSIS    Neuro/Psych  PSYCHIATRIC DISORDERS Anxiety     negative neurological ROS     GI/Hepatic ,GERD  ,,  Endo/Other  negative endocrine ROS    Renal/GU negative Renal ROS     Musculoskeletal   Abdominal   Peds  Hematology negative hematology ROS (+)   Anesthesia Other Findings Reviewed and agree with Edd Fabian pre-anesthesia clinical review note.   Reproductive/Obstetrics                             Anesthesia Physical Anesthesia Plan  ASA: 3  Anesthesia Plan: General   Post-op Pain Management:    Induction: Intravenous  PONV Risk Score and Plan: 2 and Ondansetron, Dexamethasone and Treatment may vary due to age or medical condition  Airway Management Planned: LMA  Additional Equipment:   Intra-op Plan:   Post-operative Plan: Extubation in OR  Informed Consent: I have reviewed the patients History and Physical, chart, labs and discussed the procedure including the  risks, benefits and alternatives for the proposed anesthesia with the patient or authorized representative who has indicated his/her understanding and acceptance.     Dental advisory given  Plan Discussed with: CRNA  Anesthesia Plan Comments: (Patient consented for risks of anesthesia including but not limited to:  - adverse reactions to medications - damage to eyes, teeth, lips or other oral mucosa - nerve damage due to positioning  - sore throat or hoarseness - damage to heart, brain, nerves, lungs, other parts of body or loss of life  Informed patient about role of CRNA in peri- and intra-operative care.  Patient voiced understanding.)        Anesthesia Quick Evaluation

## 2023-08-06 NOTE — Anesthesia Postprocedure Evaluation (Signed)
Anesthesia Post Note  Patient: Stephanie Hess  Procedure(s) Performed: BREAST LUMPECTOMY WITH RADIOFREQUENCY TAG IDENTIFICATION (Left) AXILLARY SENTINEL NODE BIOPSY (Left)  Patient location during evaluation: PACU Anesthesia Type: General Level of consciousness: awake and alert, oriented and patient cooperative Pain management: pain level controlled Vital Signs Assessment: post-procedure vital signs reviewed and stable Respiratory status: spontaneous breathing, nonlabored ventilation and respiratory function stable Cardiovascular status: blood pressure returned to baseline and stable Postop Assessment: adequate PO intake Anesthetic complications: no   No notable events documented.   Last Vitals:  Vitals:   08/06/23 1130 08/06/23 1145  BP: (!) 117/53 109/73  Pulse: 100 99  Resp: 20 20  Temp:    SpO2: 93% 93%    Last Pain:  Vitals:   08/06/23 1145  TempSrc: Temporal  PainSc: Asleep                 Reed Breech

## 2023-08-06 NOTE — Transfer of Care (Signed)
Immediate Anesthesia Transfer of Care Note  Patient: Stephanie Hess  Procedure(s) Performed: BREAST LUMPECTOMY WITH RADIOFREQUENCY TAG IDENTIFICATION (Left) AXILLARY SENTINEL NODE BIOPSY (Left)  Patient Location: PACU  Anesthesia Type:General  Level of Consciousness: drowsy  Airway & Oxygen Therapy: Patient Spontanous Breathing and Patient connected to face mask oxygen  Post-op Assessment: Report given to RN and Post -op Vital signs reviewed and stable  Post vital signs: Reviewed and stable  Last Vitals:  Vitals Value Taken Time  BP 102/57 08/06/23 1103  Temp    Pulse 84 08/06/23 1105  Resp 16 08/06/23 1105  SpO2 96 % 08/06/23 1105  Vitals shown include unfiled device data.  Last Pain:  Vitals:   08/06/23 0829  TempSrc: Temporal  PainSc: 0-No pain         Complications: No notable events documented.

## 2023-08-06 NOTE — Op Note (Signed)
  Pre-operative Diagnosis: Breast Cancer, Left    Post-operative Diagnosis: Same  Surgeon: Campbell Lerner, M.D., FACS  Anesthesia: LMA  Procedure: Left Breast Lumpectomy, Scout radar reflector tag directed, sentinel node biopsy  Procedure Details  The patient was seen again in the Holding Room. The benefits, complications, treatment options, and expected outcomes were discussed with the patient. The risks of bleeding, infection, recurrence of symptoms, failure to resolve symptoms, hematoma, seroma, open wound, cosmetic deformity, and the need for further surgery were discussed.  The patient was taken to Operating Room, identified as SADEY DEMSKY and the procedure verified.  A Time Out was held and the above information confirmed.  Prior to the induction of general anesthesia, antibiotic prophylaxis was administered. VTE prophylaxis was in place. The patient was positioned in the supine position. Appropriate anesthesia was then administered and tolerated well. The [facility] probe is used to mark the skin for incision.    A visual dye Isosulfan Blue 4 ml was injected periareolar dermis early under aseptic conditions.  Massage was administered to this area for 5 minutes prior to securely taping it.  The chest was prepped with Chloraprep and draped in the sterile fashion.  Then using the hand-held probe an area of high counts was identified in the axilla, an incision was made and direction by the probe aided in dissection of a lymph node which was sent for permanent section.  1 blue lymph node with counts of 7000 were identified.  Background counts were satisfactory.  Attention was turned to the [facility] tag localization site where an circumareolar incision was made. Dissection using the Scout probe to guide the lumpectomy with adequate margins was performed. This was done with electrocautery and sharp dissection with Mayo scissors. There was minimal bleeding, and the cavity packed.  The specimen was  taken to the back table and painted to demarcate the 6 surfaces of potential margin.   I returned to the cavity to remove the packing, and hemostasis was confirmed with electrocautery.   Clips placed to aid in localization for XRT.   Once assuring that hemostasis was adequate and checked multiple times the wound was closed with interrupted 3-0 Vicryl followed by 4-0 subcuticular Monocryl sutures.  The axillary wound was closed in a similar fashion. Dermabond is utilized to seal the incision.  Local infiltration of 0.25% Marcaine with epi used as a depot to the biopsy cavity.    Findings: Faxitron imaging: Confirmed presence of clip centrally within the specimen.  Estimated Blood Loss: Minimal         Drains: None         Specimens: Central left breast tissue.  Sentinel left axillary lymph node.       Complications: None         Condition: Stable  Sentinel Node Biopsy Synoptic Operative Report  Operation performed with curative intent:Yes  Tracer(s) used to identify sentinel nodes in the upfront surgery (non-neoadjuvant) setting (select all that apply):Dye and Radioactive Tracer  Tracer(s) used to identify sentinel nodes in the neoadjuvant setting (select all that apply):N/A  All nodes (colored or non-colored) present at the end of a dye-filled lymphatic channel were removed:Yes   All significantly radioactive nodes were removed:Yes  All palpable suspicious nodes were removed:Yes  Biopsy-proven positive nodes marked with clips prior to chemotherapy were identified and removed:N/A    Campbell Lerner, M.D., [facility]  08/06/2023 ; 11:17 AM

## 2023-08-06 NOTE — Anesthesia Procedure Notes (Signed)
Procedure Name: LMA Insertion Date/Time: 08/06/2023 9:15 AM  Performed by: Lily Lovings, CRNAPre-anesthesia Checklist: Patient identified, Patient being monitored, Timeout performed, Emergency Drugs available and Suction available Patient Re-evaluated:Patient Re-evaluated prior to induction Oxygen Delivery Method: Circle system utilized Preoxygenation: Pre-oxygenation with 100% oxygen Induction Type: IV induction Ventilation: Mask ventilation without difficulty LMA: LMA inserted LMA Size: 4.0 Tube type: Oral Number of attempts: 1 Placement Confirmation: positive ETCO2 and breath sounds checked- equal and bilateral Tube secured with: Tape Dental Injury: Teeth and Oropharynx as per pre-operative assessment  Comments: Placed by Davina Poke. SRNA

## 2023-08-06 NOTE — Interval H&P Note (Signed)
History and Physical Interval Note:  08/06/2023 8:32 AM  Stephanie Hess  has presented today for surgery, with the diagnosis of invasive lobular carcinoma left breast.  The various methods of treatment have been discussed with the patient and family. After consideration of risks, benefits and other options for treatment, the patient has consented to  Procedure(s): BREAST LUMPECTOMY WITH RADIOFREQUENCY TAG IDENTIFICATION (Left) AXILLARY SENTINEL NODE BIOPSY (Left) as a surgical intervention.  The patient's history has been reviewed, patient examined, no change in status, stable for surgery.  I have reviewed the patient's chart and labs.  Questions were answered to the patient's satisfaction.     Campbell Lerner

## 2023-08-06 NOTE — Discharge Instructions (Signed)

## 2023-08-07 ENCOUNTER — Encounter: Payer: Self-pay | Admitting: Surgery

## 2023-08-12 LAB — SURGICAL PATHOLOGY

## 2023-08-14 ENCOUNTER — Encounter: Payer: Self-pay | Admitting: *Deleted

## 2023-08-14 DIAGNOSIS — J441 Chronic obstructive pulmonary disease with (acute) exacerbation: Secondary | ICD-10-CM | POA: Diagnosis not present

## 2023-08-14 DIAGNOSIS — J449 Chronic obstructive pulmonary disease, unspecified: Secondary | ICD-10-CM | POA: Diagnosis not present

## 2023-08-14 DIAGNOSIS — R5381 Other malaise: Secondary | ICD-10-CM | POA: Diagnosis not present

## 2023-08-14 NOTE — Progress Notes (Signed)
OncotypeDx order submitted online.

## 2023-08-18 NOTE — Progress Notes (Unsigned)
[facility] POST-OP OFFICE VISIT  08/18/2023  HPI: Stephanie Hess is a 72 y.o. female 13 days s/p Scout tag to left breast lumpectomy with sentinel lymph node biopsy.  She has little complaint regarding postoperative pain or discomfort.  She still a bit anxious about the results and about her prognosis.  We reviewed the pathology report noted below.  And attempted to give her a chance to appreciate what she has to look forward to days to come.   SURGICAL PATHOLOGY [facility] [address], [address] [place], [place] [address] Telephone [phone] or [phone] Fax [phone]  REPORT OF SURGICAL PATHOLOGY   Accession #: [id number] Patient Name: Stephanie Hess, Stephanie Hess Visit # : [id number]  MRN: [id number] Physician: Campbell Lerner DOB/Age [birth year] (Age: 39) Gender: F Collected Date: 08/06/2023 Received Date: 08/06/2023  FINAL DIAGNOSIS       1. Lymph node, sentinel, biopsy, #1 Left axilla :      ONE LYMPH NODE, NEGATIVE FOR METASTATIC CARCINOMA (0/1).       2. Breast, lumpectomy, Left upper :      INVASIVE CARCINOMA WITH MIXED LOBULAR AND DUCTAL FEATURES, 0.9 CM, GRADE 2      DUCTAL CARCINOMA IN SITU:  CRIBRIFORM TYPE, INTERMEDIATE GRADE      MARGINS, INVASIVE:  NEGATIVE      CLOSEST, INVASIVE:  2.5 MM FROM ANTERIOR MARGIN      MARGINS, DCIS:  NEGATIVE      CLOSEST, DCIS:  5 MM FROM ANTERIOR AND POSTERIOR MARGINS      LYMPHOVASCULAR INVASION:  NOT IDENTIFIED      PROGNOSTIC MARKERS:      ER, PR, HER2, KI-67 WILL BE REPORTED IN AN ADDENDUM      OTHER: SCLEROSING ADENOSIS      SEE ONCOLOGY TABLE AND NOTE      ONCOLOGY TABLE:      INVASIVE CARCINOMA OF THE BREAST:  RESECTION      PROCEDURE: LUMPECTOMY      SPECIMEN LATERALITY: LEFT UPPER      HISTOLOGIC TYPE:INVASIVE CARCINOMA WITH MIXED LOBULAR AND DUCTAL FEATURES      HISTOLOGIC GRADE:      GLANDULAR (ACINAR)/TUBULAR DIFFERENTIATION: 3      NUCLEAR PLEOMORPHISM: 2      MITOTIC  RATE: 1      OVERALL GRADE: 6      TUMOR SIZE: 0.9 CM IN MAXIMAL DIMENSION      DUCTAL CARCINOMA IN SITU: CRIBRIFORM TYPES, INTERMEDIATE GRADE      LYMPHATIC AND/OR VASCULAR INVASION:  NOT IDENTIFIED      TREATMENT EFFECT IN THE BREAST: NO KNOWN PRESURGICAL THERAPY      MARGINS: ALL MARGINS NEGATIVE FOR INVASIVE CARCINOMA      DISTANCE FROM CLOSEST MARGIN (MM): 2.5 MM FROM ANTERIOR MARGIN      SPECIFY CLOSEST MARGIN (REQUIRED ONLY IF <10MM): ANTERIOR MARGIN      DCIS MARGINS: UNINVOLVED BY DCIS      DISTANCE FROM CLOSEST MARGIN (MM): 5 MM FROM ANTERIOR AND POSTERIOR MARGINS      SPECIFY CLOSEST MARGIN (REQUIRED ONLY IF <10MM): ANTERIOR AND POSTERIOR MARGINS      REGIONAL LYMPH NODES:      NUMBER OF LYMPH NODES EXAMINED: 1      NUMBER OF SENTINEL NODES EXAMINED: 1      NUMBER OF LYMPH NODES WITH MACROMETASTASES (>2 MM): 0      NUMBER OF LYMPH NODES WITH MICROMETASTASES: 0  NUMBER OF LYMPH NODES WITH ISOLATED TUMOR CELLS (=0.2 MM OR =200 CELLS): 0      SIZE OF LARGEST METASTATIC DEPOSIT (MM): NA      EXTRANODAL EXTENSION: NA      DISTANT METASTASIS:      DISTANT SITE(S) INVOLVED: NOT APPLICABLE      BREAST BIOMARKER TESTING PERFORMED ON PREVIOUS BIOPSY:      TESTING PERFORMED ON CASE NUMBER: NONE      ESTROGEN RECEPTOR, PROGESTERONE RECEPTOR, HER2, KI-67, WILL BE REPORTED IN AN      ADDENDUM      PATHOLOGIC STAGE CLASSIFICATION (PTNM, AJCC 8TH EDITION): PT1B, PN0      REPRESENTATIVE TUMOR BLOCK: 2/C      COMMENT(S): SEE NOTE      (V4.5.0.0)       Diagnosis Note : The lesion is composed of predominant lobular carcinoma with      small component of ductal carcinoma at the periphery of the lesion.Ductal      carcinoma in situ is also seen within the lesion.Immunohistochemical stain for      E-Cadherin shows loss of expression in the lobular carcinoma component while it      is intact in the ductal carcinoma.Myoepithelial markers (Calponin, SMM-1 and      p63) show loss of staining  in the invasive ductal carcinoma.The overall findings      are in keeping with an invasive carcinoma with mixed lobular and ductal      features.CK AE1/AE3 performed on 1/A does not show evidence of metastatic      carcinoma in the lymph node.      Controls worked appropriately.  ADDENDUM Breast, lumpectomy, Left upper PROGNOSTIC INDICATORS  Results: IMMUNOHISTOCHEMICAL AND MORPHOMETRIC ANALYSIS PERFORMED MANUALLY The tumor cells are equivocal for Her2 (2+). Her2 by FISH will be performed and the results reported separately. Estrogen Receptor:  100%, POSITIVE, STRONG STAINING INTENSITY Progesterone Receptor:  60%, POSITIVE, STRONG STAINING INTENSITY Proliferation Marker Ki67:  20% REFERENCE RANGE ESTROGEN RECEPTOR NEGATIVE     0% POSITIVE       =>1% REFERENCE RANGE PROGESTERONE RECEPTOR NEGATIVE     0% POSITIVE        =>1% All controls stained appropriately Stephanie Hess, Stephanie Hess, Sports administrator, International aid/development worker ( Signed 10 02 2024) 2C-Breast,lumpectomy FLUORESCENCE IN-SITU HYBRIDIZATION  Results: GROUP 5:   HER2 **NEGATIVE**  Equivocal form of amplification of the HER2 gene was detected in the IHC 2+ tissue sample received from this individual.  HER2 FISH was performed by a technologist and cell imaging and analysis on the [employer]. RATIO OF HER2/CEN17 SIGNALS                          1.15 AVERAGE HER2 COPY NUMBER PER CELL      1.09 The ratio of HER2/CEN 17 is within the range < 2.0 of HER2/CEN 17 and a copy number of HER2 signals per cell is <4.0.    Vital signs: There were no vitals taken for this visit.   Physical Exam: Constitutional: She appears well, at her baseline Skin: Her left circumareolar incision appears to be well-healed.  There is a small amount of bruising just cephalad to this.  There is no evidence of induration or hematoma.  That it is very soft and supple with minimal retroareolar firmness.  Her left axillary incision is clean dry and intact.  Without  evidence of any fluid retention or hematoma.  Assessment/Plan: This is a 72  y.o. female 13 days s/p breast conservation surgery.  Patient Active Problem List   Diagnosis Date Noted   Malignant neoplasm of upper-outer quadrant of left breast in female, 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/2023   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 [facility]) 07/08/2023   Aortic atherosclerosis (HCC) 05/23/2023   BMI 30.0-30.9,adult 03/07/2022   Generalized anxiety disorder 08/19/2021   Chronic obstructive pulmonary disease (HCC) 07/13/2021   Nicotine abuse 03/03/2018   Mild intermittent asthma without complication 03/03/2018   Pulmonary fibrosis (HCC) 03/03/2018   GERD (gastroesophageal reflux disease) 03/03/2018   Hyperlipidemia, mixed 05/15/2017   Vitamin D deficiency 05/15/2017    -Anticipate initiation of radiation therapy, oral estrogen blockade.  Follow-up left breast imaging in 6 months for establishment of a new baseline.  And long-term follow-up.   Campbell Lerner M.D., FACS 08/18/2023, 9:50 AM

## 2023-08-19 ENCOUNTER — Ambulatory Visit (INDEPENDENT_AMBULATORY_CARE_PROVIDER_SITE_OTHER): Payer: Medicare Other | Admitting: Surgery

## 2023-08-19 ENCOUNTER — Encounter: Payer: Self-pay | Admitting: Surgery

## 2023-08-19 VITALS — BP 127/82 | HR 92 | Temp 98.0°F | Ht 62.0 in | Wt 158.0 lb

## 2023-08-19 DIAGNOSIS — C50912 Malignant neoplasm of unspecified site of left female breast: Secondary | ICD-10-CM

## 2023-08-19 DIAGNOSIS — Z08 Encounter for follow-up examination after completed treatment for malignant neoplasm: Secondary | ICD-10-CM

## 2023-08-19 DIAGNOSIS — Z170 Estrogen receptor positive status [ER+]: Secondary | ICD-10-CM

## 2023-08-19 NOTE — Patient Instructions (Addendum)
The patient has been asked to return to the office in six months with a unilateral left breast diagnostic mammogram.   We will send you a letter about these appointments.    Continue self breast exams. Call office for any new breast issues or concerns.

## 2023-08-27 ENCOUNTER — Telehealth: Payer: Self-pay | Admitting: Oncology

## 2023-08-27 ENCOUNTER — Encounter: Payer: Self-pay | Admitting: *Deleted

## 2023-08-27 ENCOUNTER — Ambulatory Visit
Admission: RE | Admit: 2023-08-27 | Discharge: 2023-08-27 | Disposition: A | Discharge status: Home / Self Care | Payer: Medicare Other | Source: Ambulatory Visit | Attending: Radiation Oncology | Admitting: Radiation Oncology

## 2023-08-27 ENCOUNTER — Inpatient Hospital Stay: Payer: Medicare Other | Attending: Oncology | Admitting: Oncology

## 2023-08-27 ENCOUNTER — Ambulatory Visit: Payer: Medicare Other | Attending: Oncology | Admitting: Occupational Therapy

## 2023-08-27 ENCOUNTER — Encounter: Payer: Self-pay | Admitting: Oncology

## 2023-08-27 VITALS — BP 134/85 | HR 94 | Temp 97.6°F | Resp 18 | Ht 62.0 in | Wt 162.4 lb

## 2023-08-27 DIAGNOSIS — Z170 Estrogen receptor positive status [ER+]: Secondary | ICD-10-CM | POA: Insufficient documentation

## 2023-08-27 DIAGNOSIS — C50912 Malignant neoplasm of unspecified site of left female breast: Secondary | ICD-10-CM | POA: Diagnosis not present

## 2023-08-27 DIAGNOSIS — Z803 Family history of malignant neoplasm of breast: Secondary | ICD-10-CM | POA: Insufficient documentation

## 2023-08-27 DIAGNOSIS — Z1721 Progesterone receptor positive status: Secondary | ICD-10-CM | POA: Insufficient documentation

## 2023-08-27 DIAGNOSIS — C50919 Malignant neoplasm of unspecified site of unspecified female breast: Secondary | ICD-10-CM

## 2023-08-27 DIAGNOSIS — R293 Abnormal posture: Secondary | ICD-10-CM | POA: Insufficient documentation

## 2023-08-27 DIAGNOSIS — Z7189 Other specified counseling: Secondary | ICD-10-CM | POA: Diagnosis not present

## 2023-08-27 DIAGNOSIS — C50812 Malignant neoplasm of overlapping sites of left female breast: Secondary | ICD-10-CM | POA: Insufficient documentation

## 2023-08-27 DIAGNOSIS — F17210 Nicotine dependence, cigarettes, uncomplicated: Secondary | ICD-10-CM | POA: Diagnosis not present

## 2023-08-27 NOTE — Therapy (Signed)
OUTPATIENT OCCUPATIONAL THERAPY BREAST CANCER POST OP FOLLOW UP   Patient Name: Stephanie Hess MRN: [id number] DOB:24-Dec-1950, 72 y.o., female Today's Date: 08/27/2023  END OF SESSION:  OT End of Session - 08/27/23 1229     Visit Number 2    Number of Visits 2    Date for OT Re-Evaluation 09/17/23    OT Start Time 0930    OT Stop Time 1000    OT Time Calculation (min) 30 min    Activity Tolerance Patient tolerated treatment well    Behavior During Therapy WFL for tasks assessed/performed             Past Medical History:  Diagnosis Date   Cancer (HCC)    COPD (chronic obstructive pulmonary disease) (HCC)    Diverticula of colon    GERD (gastroesophageal reflux disease)    Hyperlipidemia    Renal insufficiency    2009 or 2010   Past Surgical History:  Procedure Laterality Date   APPENDECTOMY     AXILLARY SENTINEL NODE BIOPSY Left 08/06/2023   Procedure: AXILLARY SENTINEL NODE BIOPSY;  Surgeon: Campbell Lerner, MD;  Location: [facility] ORS;  Service: General;  Laterality: Left;   BREAST BIOPSY Left 07/02/2023   [place] LT BREAST BX W LOC DEV 1ST LESION IMG BX SPEC US GUIDE 07/02/2023 [facility]   BREAST LUMPECTOMY WITH RADIOFREQUENCY TAG IDENTIFICATION Left 08/06/2023   Procedure: BREAST LUMPECTOMY WITH RADIOFREQUENCY TAG IDENTIFICATION;  Surgeon: Campbell Lerner, MD;  Location: [facility] ORS;  Service: General;  Laterality: Left;   CESAREAN SECTION     ECTOPIC PREGNANCY SURGERY     Patient Active Problem List   Diagnosis Date Noted   Malignant neoplasm of upper-outer quadrant of left breast in female, 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/2023   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) 07/08/2023   Aortic atherosclerosis (HCC) 05/23/2023   BMI 30.0-30.9,adult 03/07/2022   Generalized anxiety disorder 08/19/2021   Chronic obstructive pulmonary disease (HCC) 07/13/2021   Nicotine abuse 03/03/2018   Mild intermittent asthma without complication 03/03/2018    Pulmonary fibrosis (HCC) 03/03/2018   GERD (gastroesophageal reflux disease) 03/03/2018   Hyperlipidemia, mixed 05/15/2017   Vitamin D deficiency 05/15/2017    PCP: Jairo Ben PA  REFERRING PROVIDER: DR Glade Stanford DIAG s/p L lumpectomy  THERAPY DIAG:  Abnormal posture  Rationale for Evaluation and Treatment: Rehabilitation  ONSET DATE: 08/06/23  SUBJECTIVE:  SUBJECTIVE STATEMENT: I am doing well - my daughter with me -and appt with oncologist and radiation Dr today  PERTINENT HISTORY:  08/19/23  s/p appt with DR Claudine Mouton - -Anticipate initiation of radiation therapy, oral estrogen blockade.  Follow-up left breast imaging in 6 months for establishment of a new baseline.  And long-term follow-up.     PATIENT GOALS:  Reassess how my recovery is going related to arm function, pain, and swelling.  PAIN:  Are you having pain? No  PRECAUTIONS: Recent Surgery, left UE Lymphedema risk low,   ACTIVITY LEVEL / LEISURE:  Likes to cook , around the house doing tasks and watch tv    OBJECTIVE:    OBSERVATIONS: Incision healed well - denies pain -except R upper traps since yesterday  POSTURE:  Rounded shoulders- AROM WFL on L shoulder - review again with pt and daughter scapula retraction few times day -and AAROM shoulder flexion and ABD on wall - 2-3 x day - even thru radiation to maintain her motion and decrease compensating with upper traps  LYMPHEDEMA ASSESSMENT:   LYMPHEDEMA ASSESSMENTS:    LANDMARK RIGHT   08/27/23  10 cm proximal to olecranon process 30.5   Olecranon process  26  10 cm proximal to ulnar styloid process 22.5   Just proximal to ulnar styloid process  16.8  Across hand at thumb web space    At base of 2nd digit    (Blank rows = not tested)   LANDMARK LEFT    08/27/23  10 cm proximal to olecranon process  33  Olecranon process 26   10 cm proximal to ulnar styloid process 22.5   Just proximal to ulnar styloid process 17   Across hand at thumb web space    At base of 2nd digit    (Blank rows = not tested)   L-DEX LYMPHEDEMA SCREENING: L-dex score for baseline 1.1  L UE at risk and R hand dominant    The patient was assessed using the L-Dex machine at eval to produce a lymphedema index baseline score. The patient will be reassessed on a regular basis (typically every 3 months) to obtain new L-Dex scores. If the score is > 6.5 points away from his/her baseline score indicating onset of subclinical lymphedema, it will be recommended to wear a compression garment for 4 weeks, 12 hours per day and then be reassessed. If the score continues to be > 6.5 points from baseline at reassessment, we will initiate lymphedema treatment. Assessing in this manner has a 95% rate of preventing clinically significant lymphedema.      Surgery type/Date: 08/06/23 Number of lymph nodes removed: 1 Current/past treatment (chemo, radiation, hormone therapy): plan radiation , ? chemo Other symptoms:  Heaviness/tightness No Pain No Pitting edema No Infections No Decreased scar mobility No Stemmer sign No  PATIENT EDUCATION:  Education details: HEP for ROM  Person educated: Patient and Child(ren) Education method: Explanation, Demonstration, and Verbal cues Education comprehension: verbalized understanding and returned demonstration  HOME EXERCISE PROGRAM: Reviewed previously given post op HEP.    ASSESSMENT:  CLINICAL IMPRESSION: Pt present about 3 wks s/p L lumpectomy - risk for lymphedema low- AROM for L shoulder WFL - with slight pull over L axilla and chest. Pt with rounded shoulders -review HEP for scapula retraction and AAROM for L shoulder to do during radiation to maintain her motion. Circumference of bilateral UE taken - and will redo SOZO 1/2 thru  radiation.  Pt will benefit from skilled  therapeutic intervention to improve on the following deficits: Decreased knowledge of precautions, impaired UE functional use, pain, decreased ROM, postural dysfunction.   OT treatment/interventions: ADL/Self care home management, 97110-Therapeutic exercises, 97535- Self Care, 16109- Manual therapy, Patient/Family education, and Scar mobilization   GOALS: Goals reviewed with patient? Yes  LONG TERM GOALS:  (STG=LTG)  GOALS Name Target Date  Goal status  1 Pt will demonstrate she has regained full shoulder ROM and function post operatively compared to baselines and no sign of lymphedema.  Baseline: 6 wks  INITIAL                    PLAN:  OT FREQUENCY/DURATION: 2 visit in 12 wks   PLAN FOR NEXT SESSION: Assess AROM in bilateral shoulders and repeat SOZO      Scar massage You can begin gentle scar massage to you incision sites. Gently place one hand on the incision and move the skin (without sliding on the skin) in various directions. Do this for a few minutes and then you can gently massage either coconut oil or vitamin E cream into the scars.   Home exercise Program Continue doing the exercises you were given until you feel like you can do them without feeling any tightness at the end.   Walking Program Studies show that 30 minutes of walking per day (fast enough to elevate your heart rate) can significantly reduce the risk of a cancer recurrence. If you can't walk due to other medical reasons, we encourage you to find another activity you could do (like a stationary bike or water exercise).  Posture After breast cancer surgery, people frequently sit with rounded shoulders posture because it puts their incisions on slack and feels better. If you sit like this and scar tissue forms in that position, you can become very tight and have pain sitting or standing with good posture. Try to be aware of your posture and sit and stand up tall  to heal properly.  Follow up OT: It is recommended you return every 3 months for the first 3 years following surgery to be assessed on the SOZO machine for an L-Dex score. This helps prevent clinically significant lymphedema in 95% of patients. These follow up screens are 10 minute appointments that you are not billed for.  Oletta Cohn, OTR/L,CLT 08/27/2023, 12:32 PM

## 2023-08-27 NOTE — Telephone Encounter (Signed)
I called to schedule the DEXA and they said that the last one was done at [facility] you want to compare the scans then it will have to be scheduled again at [facility] so it is on the same machine. If you do not want to compare the scans then we can call [place] back and schedule with them.   I let Dr.Rao know and she said that the DEXA can be scheduled at [facility].  I called pt and let her know the update and that she will have to call [facility] to schedule the appt.  Pt understood

## 2023-08-27 NOTE — Consult Note (Signed)
NEW PATIENT EVALUATION  Name: Stephanie Hess  MRN: [id number]  Date:   08/27/2023     DOB: 1951/01/19   This 72 y.o. 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  REFERRING PHYSICIAN: Melida Quitter, PA  CHIEF COMPLAINT: No chief complaint on file.   DIAGNOSIS: Breast cancer   PREVIOUS INVESTIGATIONS:  Mammograms and ultrasound reviewed Clinical notes reviewed Pathology port reviewed  HPI: Patient is a 72 year old female who presented with an abnormal mammogram of her left breast.  There was an indeterminate 7 mm developing asymmetry in the area adjacent to dystrophic calcifications.  There was no suspicious left axillary adenopathy.  Patient underwent biopsy which was positive for lobular carcinoma.  MRI scan showed 1.2 cm irregular enhancing mass in the left breast reflecting biopsy-proven lobular carcinoma there is also indeterminate 4 mm enhancing mass in the inferior left breast near 6:00.  Core biopsy of the 4 mm mass was negative for malignancy.  Tumor spanned 0.9 cm overall grade 2.  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  She has done well postoperatively.  Oncotype DX is pending.  She specifically denies breast tenderness cough or bone pain.  PLANNED TREATMENT REGIMEN: Hypofractionated left whole breast radiation with scar boost  PAST MEDICAL HISTORY:  has a past medical history of Cancer (HCC), COPD (chronic obstructive pulmonary disease) (HCC), Diverticula of colon, GERD (gastroesophageal reflux disease), Hyperlipidemia, and Renal insufficiency.    PAST SURGICAL HISTORY:  Past Surgical History:  Procedure Laterality Date   APPENDECTOMY     AXILLARY SENTINEL NODE BIOPSY Left 08/06/2023   Procedure: AXILLARY SENTINEL NODE BIOPSY;  Surgeon: Campbell Lerner, MD;  Location: [facility] ORS;  Service: General;  Laterality: Left;   BREAST BIOPSY Left 07/02/2023   [place] LT BREAST BX W LOC DEV 1ST LESION IMG BX SPEC US GUIDE 07/02/2023 [facility]   BREAST LUMPECTOMY WITH RADIOFREQUENCY TAG IDENTIFICATION Left 08/06/2023   Procedure: BREAST LUMPECTOMY WITH RADIOFREQUENCY TAG IDENTIFICATION;  Surgeon: Campbell Lerner, MD;  Location: [facility] ORS;  Service: General;  Laterality: Left;   CESAREAN SECTION     ECTOPIC PREGNANCY SURGERY      FAMILY HISTORY: family history includes Breast cancer in her paternal aunt; Heart attack in her mother.  SOCIAL HISTORY:  reports that she has been smoking cigarettes. She has a 25 pack-year smoking history. She has been exposed to tobacco smoke. She has never used smokeless tobacco. She reports current alcohol use. She reports that she does not use drugs.  ALLERGIES: Penicillins, Shellfish allergy, Sulfa antibiotics, Clarithromycin, Amoxicillin-pot clavulanate, Iodinated contrast media, Atorvastatin, and Macrobid [nitrofurantoin]  MEDICATIONS:  Current Outpatient Medications  Medication Sig Dispense Refill   acetaminophen (TYLENOL) 500 MG tablet Take 1,000 mg by mouth every 6 (six) hours as needed.     azithromycin (ZITHROMAX) 250 MG tablet Take 250 mg by mouth daily.     budesonide (PULMICORT) 0.5 MG/2ML nebulizer solution Take 2 mLs by nebulization 2 (two) times daily.     busPIRone (BUSPAR) 10 MG tablet TAKE 1/2 TO 1 TABLET UP TO THREE TIMES DAILY AS NEEDED FOR ANXIETY 270 tablet 2   formoterol (PERFOROMIST) 20 MCG/2ML nebulizer solution INHALE 2 ML (20 MCG TOTAL) BY NEBULIZATION TWO (2) TIMES A DAY.     ipratropium (ATROVENT) 0.02 % nebulizer solution Take 2.5 mLs (0.5 mg  total) by nebulization 4 (four) times daily. 75 mL 3   levalbuterol (XOPENEX HFA) 45 MCG/ACT inhaler INHALE 1 PUFF INTO THE LUNGS EVERY 6 HOURS AS NEEDED FOR WHEEZING. 30 each 2   LORazepam (ATIVAN) 0.5 MG tablet TAKE 1/2 TO 1 TABLET TWICE DAILY AS  NEEDED FOR ANXIETY 30 tablet 1   omeprazole (PRILOSEC) 20 MG capsule Take 20 mg by mouth daily.     OXYGEN Inhale into the lungs. 2 litre at night     predniSONE (DELTASONE) 10 MG tablet Take 10 mg by mouth daily.     traZODone (DESYREL) 50 MG tablet Take 0.5-1 tablets (25-50 mg total) by mouth at bedtime as needed. for sleep 90 tablet 1   No current facility-administered medications for this encounter.    ECOG PERFORMANCE STATUS:  0 - Asymptomatic  REVIEW OF SYSTEMS: Patient denies any weight loss, fatigue, weakness, fever, chills or night sweats. Patient denies any loss of vision, blurred vision. Patient denies any ringing  of the ears or hearing loss. No irregular heartbeat. Patient denies heart murmur or history of fainting. Patient denies any chest pain or pain radiating to her upper extremities. Patient denies any shortness of breath, difficulty breathing at night, cough or hemoptysis. Patient denies any swelling in the lower legs. Patient denies any nausea vomiting, vomiting of blood, or coffee ground material in the vomitus. Patient denies any stomach pain. Patient states has had normal bowel movements no significant constipation or diarrhea. Patient denies any dysuria, hematuria or significant nocturia. Patient denies any problems walking, swelling in the joints or loss of balance. Patient denies any skin changes, loss of hair or loss of weight. Patient denies any excessive worrying or anxiety or significant depression. Patient denies any problems with insomnia. Patient denies excessive thirst, polyuria, polydipsia. Patient denies any swollen glands, patient denies easy bruising or easy bleeding. Patient denies any recent infections, allergies or URI. Patient "s visual fields have not changed significantly in recent time.   PHYSICAL EXAM: There were no vitals taken for this visit. Patient is status post wide local excision and sentinel node biopsy of the left breast.  Incisions are healed  well.  No dominant masses noted in either breast.  No axillary or supraclavicular adenopathy is identified.  Well-developed well-nourished patient in NAD. HEENT reveals PERLA, EOMI, discs not visualized.  Oral cavity is clear. No oral mucosal lesions are identified. Neck is clear without evidence of cervical or supraclavicular adenopathy. Lungs are clear to A&P. Cardiac examination is essentially unremarkable with regular rate and rhythm without murmur rub or thrill. Abdomen is benign with no organomegaly or masses noted. Motor sensory and DTR levels are equal and symmetric in the upper and lower extremities. Cranial nerves II through XII are grossly intact. Proprioception is intact. No peripheral adenopathy or edema is identified. No motor or sensory levels are noted. Crude visual fields are within normal range.  LABORATORY DATA: Pathology reports reviewed    RADIOLOGY RESULTS: Mammogram ultrasound and MRI scan reviewed compatible with above-stated findings   IMPRESSION: Stage Ia invasive mammary carcinoma with lobular and ductal features status post wide local excision in 72 year old female with Oncotype DX still pending  PLAN: At this time I have recommended hypofractionated whole breast radiation over 3 weeks.  Would also boost her scar another 1000 centigrade using photon beam treatment.  Risks and benefits of treatment including skin reaction fatigue alteration of blood counts possible inclusion of superficial lung all were reviewed in detail with the patient.  I pushed at her simulation about a week and a half to await her Oncotype DX results.  Patient and daughter both comprehend my recommendations well.  Patient also will benefit from endocrine therapy after completion of radiation.  I would like to take this opportunity to thank you for allowing me to participate in the care of your patient.Carmina Miller, MD

## 2023-08-27 NOTE — Progress Notes (Signed)
[employer] is still waiting on specimen for Oncotype testing.  [facility] pathology and they said the specimen was sent today.  Per Dr. Oneita Kras [facility] had not received request until 10/14.  Dr. Smith Robert asked that I call [employer] to inquire about the delay.   [facility] [facility] had sent out the original request on 08/14/23 when the testing was ordered.   They then reached out to [facility] again on 10/14 and were asked to re-fax the request.

## 2023-08-29 NOTE — Progress Notes (Unsigned)
Hematology/Oncology Consult note [facility]  Telephone:([phone] [phone]  Patient Care Team: Melida Quitter, PA as PCP - General (Family Medicine) Hulen Luster, RN as Oncology Nurse Navigator Creig Hines, MD as Consulting Physician (Oncology)   Name of the patient: Stephanie Hess  [id number]  17-Oct-1951   Date of visit: 08/29/23  Diagnosis-pathological prognostic stage Ia invasive lobular carcinoma of the left breast pT1b N0 M0 ER/PR positive HER2 negative grade 2  Chief complaint/ Reason for visit-discuss final pathology results and further management  Heme/Onc history: Patient is a 72 year old female who underwent a routine screening mammogram in August 2024 which showed a possible mass in the left breast.  This was followed by a diagnostic mammogram which showed a 7 x 5 x 6 mm mass at the 12 o'clock position of the left breast 5 cm from the Hess.  No suspicious left axillary lymph nodes.  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 Family History significant for paternal aunt with breast cancer..  Menarche at the age of 57.  G4, P2.  No prior abnormal breast biopsies. MRI bilateral breast showed 1.2 cm enhancing mass in the left breast and 4 mm indeterminate mass in the inferior left breast.  The 4 mm mass was biopsied and was negative for malignancy ```  Lumpectomy pathology from 08/06/2023 showed 0.9 cm invasive carcinoma with mixed lobular and ductal features with negative margins.  No evidence of lymphovascular invasion.  Grade 2.  1 sentinel lymph node negative for malignancy.  Oncotype testing has been ordered and is currently pending  Interval history-patient is recovering well from her lumpectomy.  She is anxious about her next steps but is otherwise doing well  ECOG PS- 1 Pain scale-  0   Review of systems- Review of Systems  Constitutional:  Negative for chills, fever, malaise/fatigue and weight loss.  HENT:  Negative for congestion, ear discharge and nosebleeds.   Eyes:  Negative for blurred vision.  Respiratory:  Negative for cough, hemoptysis, sputum production, shortness of breath and wheezing.   Cardiovascular:  Negative for chest pain, palpitations, orthopnea and claudication.  Gastrointestinal:  Negative for abdominal pain, blood in stool, constipation, diarrhea, heartburn, melena, nausea and vomiting.  Genitourinary:  Negative for dysuria, flank pain, frequency, hematuria and urgency.  Musculoskeletal:  Negative for back pain, joint pain and myalgias.  Skin:  Negative for rash.  Neurological:  Negative for dizziness, tingling, focal weakness, seizures, weakness and headaches.  Endo/Heme/Allergies:  Does not bruise/bleed easily.  Psychiatric/Behavioral:  Negative for depression and suicidal ideas. The patient does not have insomnia.       Allergies  Allergen Reactions   Penicillins Nausea And Vomiting and Other (See Comments)    Has patient had a PCN reaction causing immediate rash, facial/tongue/throat swelling, SOB or lightheadedness with hypotension: No Has patient had a PCN reaction causing severe rash involving mucus membranes or skin necrosis: No Has patient had a PCN reaction that required hospitalization: No Has patient had a PCN reaction occurring within the last 10 years: Yes If all of the above answers are "NO", then may proceed with Cephalosporin use.    Shellfish Allergy Nausea And Vomiting   Sulfa Antibiotics Rash and Other (See Comments)    Other reaction(s): Unknown   Clarithromycin Rash and Nausea Only    rash  Amoxicillin-Pot Clavulanate Nausea And Vomiting   Iodinated Contrast Media Other (See Comments)    acute renal insufficiency acute renal insufficiency   Atorvastatin Rash   Macrobid [Nitrofurantoin] Rash     Past Medical  History:  Diagnosis Date   Cancer (HCC)    COPD (chronic obstructive pulmonary disease) (HCC)    Diverticula of colon    GERD (gastroesophageal reflux disease)    Hyperlipidemia    Renal insufficiency    2009 or 2010     Past Surgical History:  Procedure Laterality Date   APPENDECTOMY     AXILLARY SENTINEL NODE BIOPSY Left 08/06/2023   Procedure: AXILLARY SENTINEL NODE BIOPSY;  Surgeon: Campbell Lerner, MD;  Location: [facility] ORS;  Service: General;  Laterality: Left;   BREAST BIOPSY Left 07/02/2023   [place] LT BREAST BX W LOC DEV 1ST LESION IMG BX SPEC US GUIDE 07/02/2023 [facility]   BREAST LUMPECTOMY WITH RADIOFREQUENCY TAG IDENTIFICATION Left 08/06/2023   Procedure: BREAST LUMPECTOMY WITH RADIOFREQUENCY TAG IDENTIFICATION;  Surgeon: Campbell Lerner, MD;  Location: [facility] ORS;  Service: General;  Laterality: Left;   CESAREAN SECTION     ECTOPIC PREGNANCY SURGERY      Social History   Socioeconomic History   Marital status: Divorced    Spouse name: Not on file   Number of children: Not on file   Years of education: Not on file   Highest education level: Not on file  Occupational History   Not on file  Tobacco Use   Smoking status: Every Day    Current packs/day: 0.50    Average packs/day: 0.5 packs/day for 50.0 years (25.0 ttl pk-yrs)    Types: Cigarettes    Passive exposure: Past   Smokeless tobacco: Never  Vaping Use   Vaping status: Never Used  Substance and Sexual Activity   Alcohol use: Yes    Comment: social   Drug use: No   Sexual activity: Not Currently  Other Topics Concern   Not on file  Social History Narrative   Not on file   Social Determinants of Health   Financial Resource Strain: Low Risk  (11/18/2022)   Overall Financial Resource Strain (CARDIA)    Difficulty of Paying Living Expenses: Not hard at all  Food Insecurity: No Food Insecurity (11/18/2022)   Hunger Vital Sign    Worried About Running Out of Food in the Last Year: Never true    Ran Out  of Food in the Last Year: Never true  Transportation Needs: No Transportation Needs (11/18/2022)   PRAPARE - Administrator, Civil Service (Medical): No    Lack of Transportation (Non-Medical): No  Physical Activity: Inactive (11/18/2022)   Exercise Vital Sign    Days of Exercise per Week: 0 days    Minutes of Exercise per Session: 0 min  Stress: No Stress Concern Present (11/18/2022)   [employer] of Occupational Health - Occupational Stress Questionnaire    Feeling of Stress : Not at all  Social Connections: Socially Isolated (11/18/2022)   Social Connection and Isolation Panel [NHANES]    Frequency of Communication with Friends and Family: More than three times a week    Frequency of Social Gatherings with Friends and Family: Not on file    Attends Religious Services: Never    Active Member of Clubs or Organizations: No    Attends Banker Meetings: Never    Marital Status: Divorced  Catering manager Violence: Not At Risk (11/18/2022)   Humiliation, Afraid,  Rape, and Kick questionnaire    Fear of Current or Ex-Partner: No    Emotionally Abused: No    Physically Abused: No    Sexually Abused: No    Family History  Problem Relation Age of Onset   Breast cancer Paternal Aunt    Heart attack Mother      Current Outpatient Medications:    acetaminophen (TYLENOL) 500 MG tablet, Take 1,000 mg by mouth every 6 (six) hours as needed., Disp: , Rfl:    azithromycin (ZITHROMAX) 250 MG tablet, Take 250 mg by mouth daily., Disp: , Rfl:    budesonide (PULMICORT) 0.5 MG/2ML nebulizer solution, Take 2 mLs by nebulization 2 (two) times daily., Disp: , Rfl:    busPIRone (BUSPAR) 10 MG tablet, TAKE 1/2 TO 1 TABLET UP TO THREE TIMES DAILY AS NEEDED FOR ANXIETY, Disp: 270 tablet, Rfl: 2   formoterol (PERFOROMIST) 20 MCG/2ML nebulizer solution, INHALE 2 ML (20 MCG TOTAL) BY NEBULIZATION TWO (2) TIMES A DAY., Disp: , Rfl:    ipratropium (ATROVENT) 0.02 % nebulizer solution,  Take 2.5 mLs (0.5 mg total) by nebulization 4 (four) times daily., Disp: 75 mL, Rfl: 3   levalbuterol (XOPENEX HFA) 45 MCG/ACT inhaler, INHALE 1 PUFF INTO THE LUNGS EVERY 6 HOURS AS NEEDED FOR WHEEZING., Disp: 30 each, Rfl: 2   LORazepam (ATIVAN) 0.5 MG tablet, TAKE 1/2 TO 1 TABLET TWICE DAILY AS NEEDED FOR ANXIETY, Disp: 30 tablet, Rfl: 1   omeprazole (PRILOSEC) 20 MG capsule, Take 20 mg by mouth daily., Disp: , Rfl:    OXYGEN, Inhale into the lungs. 2 litre at night, Disp: , Rfl:    predniSONE (DELTASONE) 10 MG tablet, Take 10 mg by mouth daily., Disp: , Rfl:    traZODone (DESYREL) 50 MG tablet, Take 0.5-1 tablets (25-50 mg total) by mouth at bedtime as needed. for sleep, Disp: 90 tablet, Rfl: 1  Physical exam:  Vitals:   08/27/23 1036  BP: 134/85  Pulse: 94  Resp: 18  Temp: 97.6 F (36.4 C)  TempSrc: Tympanic  SpO2: 95%  Weight: 162 lb 6.4 oz (73.7 kg)  Height: 5\' 2"  (1.575 m)   Physical Exam Cardiovascular:     Rate and Rhythm: Normal rate and regular rhythm.     Heart sounds: Normal heart sounds.  Pulmonary:     Effort: Pulmonary effort is normal.  Skin:    General: Skin is warm and dry.  Neurological:     Mental Status: She is alert and oriented to person, place, and time.         Latest Ref Rng & Units 05/23/2023   10:57 AM  CMP  Glucose 70 - 99 mg/dL 235   BUN 8 - 27 mg/dL 11   Creatinine 5.73 - 1.00 mg/dL 2.20   Sodium 254 - 270 mmol/L 141   Potassium 3.5 - 5.2 mmol/L 4.2   Chloride 96 - 106 mmol/L 106   CO2 20 - 29 mmol/L 19   Calcium 8.7 - 10.3 mg/dL 9.8   Total Protein 6.0 - 8.5 g/dL 6.9   Total Bilirubin 0.0 - 1.2 mg/dL <6.2   Alkaline Phos 44 - 121 IU/L 63   AST 0 - 40 IU/L 28   ALT 0 - 32 IU/L 40       Latest Ref Rng & Units 05/23/2023   10:57 AM  CBC  WBC 3.4 - 10.8 x10E3/uL 9.2   Hemoglobin 11.1 - 15.9 g/dL 37.6   Hematocrit 28.3 - 46.6 %  38.5   Platelets 150 - 450 x10E3/uL 330     No images are attached to the encounter.  MM Breast  Surgical Specimen  Result Date: 08/06/2023 CLINICAL DATA:  Status post [facility] localized LEFT breast lumpectomy. Patient is status post ultrasound-guided biopsy of a LEFT breast developing asymmetry which demonstrated invasive lobular carcinoma (RIBBON clip) EXAM: SPECIMEN RADIOGRAPH OF THE LEFT BREAST COMPARISON:  Previous exam(s). FINDINGS: Status post excision of the LEFT breast. [facility] reflector and RIBBON shaped clip are present within the specimen. IMPRESSION: Specimen radiograph of the LEFT breast. Electronically Signed   By: Meda Klinefelter M.D.   On: 08/06/2023 10:33   NM Sentinel Node Inj-No Rpt (Breast)  Result Date: 08/06/2023 Sulfur Colloid was injected by the Nuclear Medicine Technologist for sentinel lymph node localization.     Assessment and plan- Patient is a 72 y.o. female with pathological prognostic stage Ia invasive carcinoma of the left breast with mixed ductal and lobular features pT1b N0 M0 ER/PR positive HER2 negative here to discuss final pathology results and further management  Discussed the results of final pathology with the patient which showed a 0.9 cm invasive carcinoma with mixed ductal and lobular features grade 2 with negative margins.  1 sentinel lymph node negative for malignancy.  Her Oncotype testing has been sent out and is still pending.  Will inform the patient once the results are back.  She is also meeting with radiation oncology today.  Once Oncotype confirms that she is not in the high risk category she can proceed with adjuvant radiation.  Given that her tumor is strongly ER/PR positive there would be role for endocrine therapy upon completion of radiation.  Discussed risks and benefits of aromatase inhibitors including all but not limited to hot flashes mood swings arthralgias vaginal dryness hypercholesterolemia as well as worsening bone health.  I will send her a prescription for letrozole once her Oncotype is back.  I will tentatively  see her back in about 3 to 4 months time with a bone density scan prior.  Treatment will be given with a curative intent   Cancer Staging  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 [facility]) Staging form: Breast, AJCC 8th Edition - Clinical stage from 07/07/2023: Stage IA (cT1b, cN0, cM0, G2, ER+, PR+, HER2-) - Signed by Creig Hines, MD on 07/13/2023 Histologic grading system: 3 grade system - Pathologic stage from 08/29/2023: Stage IA (pT1b, pN0, cM0, G2, ER+, PR+, HER2-) - Signed by Creig Hines, MD on 08/29/2023 Stage prefix: Initial diagnosis Multigene prognostic tests performed: Oncotype DX Histologic grading system: 3 grade system     Visit Diagnosis 1. Goals of care, counseling/discussion   2. 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 [facility])      Dr. Owens Shark, MD, MPH [facility] [phone] 08/29/2023 8:52 AM

## 2023-08-30 ENCOUNTER — Other Ambulatory Visit: Payer: Self-pay | Admitting: Nurse Practitioner

## 2023-08-30 DIAGNOSIS — F411 Generalized anxiety disorder: Secondary | ICD-10-CM

## 2023-09-03 ENCOUNTER — Other Ambulatory Visit: Payer: Self-pay | Admitting: Oncology

## 2023-09-03 ENCOUNTER — Encounter: Payer: Self-pay | Admitting: *Deleted

## 2023-09-03 DIAGNOSIS — C50412 Malignant neoplasm of upper-outer quadrant of left female breast: Secondary | ICD-10-CM | POA: Diagnosis not present

## 2023-09-03 DIAGNOSIS — M810 Age-related osteoporosis without current pathological fracture: Secondary | ICD-10-CM

## 2023-09-03 DIAGNOSIS — Z13820 Encounter for screening for osteoporosis: Secondary | ICD-10-CM

## 2023-09-03 DIAGNOSIS — Z170 Estrogen receptor positive status [ER+]: Secondary | ICD-10-CM | POA: Diagnosis not present

## 2023-09-03 DIAGNOSIS — Z7189 Other specified counseling: Secondary | ICD-10-CM

## 2023-09-03 MED ORDER — LETROZOLE 2.5 MG PO TABS: 2.5000 mg | ORAL_TABLET | Freq: Every day | ORAL | 6 refills | Status: DC

## 2023-09-03 NOTE — Progress Notes (Signed)
Notified Ms. Donnell of low oncotype score of 14 and chemotherapy not needed.  Letrozole Rx sent in for her to begin after radiation is complete.   Dexa scan is scheduled for 11/15 at [facility].   Per Dr. Smith Robert it is ok to schedule as new baseline since Ms. Epps didn't want to go back to [facility] to have Dexa performed.

## 2023-09-04 ENCOUNTER — Other Ambulatory Visit: Payer: Self-pay | Admitting: Radiation Oncology

## 2023-09-04 ENCOUNTER — Encounter: Payer: Self-pay | Admitting: Oncology

## 2023-09-04 DIAGNOSIS — C801 Malignant (primary) neoplasm, unspecified: Secondary | ICD-10-CM

## 2023-09-09 ENCOUNTER — Ambulatory Visit
Admission: RE | Admit: 2023-09-09 | Discharge: 2023-09-09 | Disposition: A | Discharge status: Home / Self Care | Payer: Medicare Other | Source: Ambulatory Visit | Attending: Radiation Oncology | Admitting: Radiation Oncology

## 2023-09-09 ENCOUNTER — Encounter: Payer: Self-pay | Admitting: *Deleted

## 2023-09-09 DIAGNOSIS — G4734 Idiopathic sleep related nonobstructive alveolar hypoventilation: Secondary | ICD-10-CM | POA: Diagnosis not present

## 2023-09-09 DIAGNOSIS — C801 Malignant (primary) neoplasm, unspecified: Secondary | ICD-10-CM

## 2023-09-09 DIAGNOSIS — Z170 Estrogen receptor positive status [ER+]: Secondary | ICD-10-CM | POA: Diagnosis not present

## 2023-09-09 DIAGNOSIS — C50412 Malignant neoplasm of upper-outer quadrant of left female breast: Secondary | ICD-10-CM | POA: Insufficient documentation

## 2023-09-09 DIAGNOSIS — J439 Emphysema, unspecified: Secondary | ICD-10-CM | POA: Diagnosis not present

## 2023-09-09 DIAGNOSIS — J441 Chronic obstructive pulmonary disease with (acute) exacerbation: Secondary | ICD-10-CM | POA: Diagnosis not present

## 2023-09-09 DIAGNOSIS — R0602 Shortness of breath: Secondary | ICD-10-CM | POA: Diagnosis not present

## 2023-09-09 DIAGNOSIS — I2510 Atherosclerotic heart disease of native coronary artery without angina pectoris: Secondary | ICD-10-CM | POA: Diagnosis not present

## 2023-09-09 DIAGNOSIS — Z510 Encounter for antineoplastic radiation therapy: Secondary | ICD-10-CM | POA: Insufficient documentation

## 2023-09-09 DIAGNOSIS — J45901 Unspecified asthma with (acute) exacerbation: Secondary | ICD-10-CM | POA: Diagnosis not present

## 2023-09-09 DIAGNOSIS — J849 Interstitial pulmonary disease, unspecified: Secondary | ICD-10-CM | POA: Diagnosis not present

## 2023-09-09 DIAGNOSIS — F17200 Nicotine dependence, unspecified, uncomplicated: Secondary | ICD-10-CM | POA: Diagnosis not present

## 2023-09-12 ENCOUNTER — Other Ambulatory Visit: Payer: Self-pay | Admitting: *Deleted

## 2023-09-12 DIAGNOSIS — C50912 Malignant neoplasm of unspecified site of left female breast: Secondary | ICD-10-CM

## 2023-09-14 DIAGNOSIS — J449 Chronic obstructive pulmonary disease, unspecified: Secondary | ICD-10-CM | POA: Diagnosis not present

## 2023-09-14 DIAGNOSIS — J441 Chronic obstructive pulmonary disease with (acute) exacerbation: Secondary | ICD-10-CM | POA: Diagnosis not present

## 2023-09-14 DIAGNOSIS — R5381 Other malaise: Secondary | ICD-10-CM | POA: Diagnosis not present

## 2023-09-15 DIAGNOSIS — Z510 Encounter for antineoplastic radiation therapy: Secondary | ICD-10-CM | POA: Diagnosis not present

## 2023-09-15 DIAGNOSIS — Z170 Estrogen receptor positive status [ER+]: Secondary | ICD-10-CM | POA: Diagnosis not present

## 2023-09-15 DIAGNOSIS — C50812 Malignant neoplasm of overlapping sites of left female breast: Secondary | ICD-10-CM | POA: Insufficient documentation

## 2023-09-15 DIAGNOSIS — R293 Abnormal posture: Secondary | ICD-10-CM | POA: Diagnosis not present

## 2023-09-15 DIAGNOSIS — C50412 Malignant neoplasm of upper-outer quadrant of left female breast: Secondary | ICD-10-CM | POA: Insufficient documentation

## 2023-09-16 ENCOUNTER — Ambulatory Visit
Admission: RE | Admit: 2023-09-16 | Discharge: 2023-09-16 | Disposition: A | Discharge status: Home / Self Care | Payer: Medicare Other | Source: Ambulatory Visit | Attending: Radiation Oncology | Admitting: Radiation Oncology

## 2023-09-16 DIAGNOSIS — C50812 Malignant neoplasm of overlapping sites of left female breast: Secondary | ICD-10-CM | POA: Diagnosis not present

## 2023-09-16 DIAGNOSIS — C50412 Malignant neoplasm of upper-outer quadrant of left female breast: Secondary | ICD-10-CM | POA: Diagnosis not present

## 2023-09-16 DIAGNOSIS — Z170 Estrogen receptor positive status [ER+]: Secondary | ICD-10-CM | POA: Diagnosis not present

## 2023-09-16 DIAGNOSIS — R293 Abnormal posture: Secondary | ICD-10-CM | POA: Diagnosis not present

## 2023-09-16 DIAGNOSIS — Z510 Encounter for antineoplastic radiation therapy: Secondary | ICD-10-CM | POA: Diagnosis not present

## 2023-09-17 ENCOUNTER — Other Ambulatory Visit: Payer: Self-pay

## 2023-09-17 ENCOUNTER — Ambulatory Visit
Admission: RE | Admit: 2023-09-17 | Discharge: 2023-09-17 | Disposition: A | Discharge status: Home / Self Care | Payer: Medicare Other | Source: Ambulatory Visit | Attending: Radiation Oncology | Admitting: Radiation Oncology

## 2023-09-17 DIAGNOSIS — R293 Abnormal posture: Secondary | ICD-10-CM | POA: Diagnosis not present

## 2023-09-17 DIAGNOSIS — C50412 Malignant neoplasm of upper-outer quadrant of left female breast: Secondary | ICD-10-CM | POA: Diagnosis not present

## 2023-09-17 DIAGNOSIS — Z170 Estrogen receptor positive status [ER+]: Secondary | ICD-10-CM | POA: Diagnosis not present

## 2023-09-17 DIAGNOSIS — C50812 Malignant neoplasm of overlapping sites of left female breast: Secondary | ICD-10-CM | POA: Diagnosis not present

## 2023-09-17 DIAGNOSIS — Z510 Encounter for antineoplastic radiation therapy: Secondary | ICD-10-CM | POA: Diagnosis not present

## 2023-09-17 LAB — RAD ONC ARIA SESSION SUMMARY
Course Elapsed Days: 0
Plan Fractions Treated to Date: 1
Plan Prescribed Dose Per Fraction: 2.66 Gy
Plan Total Fractions Prescribed: 16
Plan Total Prescribed Dose: 42.56 Gy
Reference Point Dosage Given to Date: 2.66 Gy
Reference Point Session Dosage Given: 2.66 Gy
Session Number: 1

## 2023-09-18 ENCOUNTER — Other Ambulatory Visit: Payer: Self-pay

## 2023-09-18 ENCOUNTER — Ambulatory Visit
Admission: RE | Admit: 2023-09-18 | Discharge: 2023-09-18 | Disposition: A | Discharge status: Home / Self Care | Payer: Medicare Other | Source: Ambulatory Visit | Attending: Radiation Oncology | Admitting: Radiation Oncology

## 2023-09-18 DIAGNOSIS — C50812 Malignant neoplasm of overlapping sites of left female breast: Secondary | ICD-10-CM | POA: Diagnosis not present

## 2023-09-18 DIAGNOSIS — R293 Abnormal posture: Secondary | ICD-10-CM | POA: Diagnosis not present

## 2023-09-18 DIAGNOSIS — C50412 Malignant neoplasm of upper-outer quadrant of left female breast: Secondary | ICD-10-CM | POA: Diagnosis not present

## 2023-09-18 DIAGNOSIS — Z170 Estrogen receptor positive status [ER+]: Secondary | ICD-10-CM | POA: Diagnosis not present

## 2023-09-18 DIAGNOSIS — Z510 Encounter for antineoplastic radiation therapy: Secondary | ICD-10-CM | POA: Diagnosis not present

## 2023-09-18 LAB — RAD ONC ARIA SESSION SUMMARY
Course Elapsed Days: 1
Plan Fractions Treated to Date: 2
Plan Prescribed Dose Per Fraction: 2.66 Gy
Plan Total Fractions Prescribed: 16
Plan Total Prescribed Dose: 42.56 Gy
Reference Point Dosage Given to Date: 5.32 Gy
Reference Point Session Dosage Given: 2.66 Gy
Session Number: 2

## 2023-09-19 ENCOUNTER — Ambulatory Visit
Admission: RE | Admit: 2023-09-19 | Discharge: 2023-09-19 | Disposition: A | Discharge status: Home / Self Care | Payer: Medicare Other | Source: Ambulatory Visit | Attending: Radiation Oncology | Admitting: Radiation Oncology

## 2023-09-19 ENCOUNTER — Other Ambulatory Visit: Payer: Self-pay

## 2023-09-19 DIAGNOSIS — R293 Abnormal posture: Secondary | ICD-10-CM | POA: Diagnosis not present

## 2023-09-19 DIAGNOSIS — C50412 Malignant neoplasm of upper-outer quadrant of left female breast: Secondary | ICD-10-CM | POA: Diagnosis not present

## 2023-09-19 DIAGNOSIS — C50812 Malignant neoplasm of overlapping sites of left female breast: Secondary | ICD-10-CM | POA: Diagnosis not present

## 2023-09-19 DIAGNOSIS — Z170 Estrogen receptor positive status [ER+]: Secondary | ICD-10-CM | POA: Diagnosis not present

## 2023-09-19 DIAGNOSIS — Z510 Encounter for antineoplastic radiation therapy: Secondary | ICD-10-CM | POA: Diagnosis not present

## 2023-09-19 LAB — RAD ONC ARIA SESSION SUMMARY
Course Elapsed Days: 2
Plan Fractions Treated to Date: 3
Plan Prescribed Dose Per Fraction: 2.66 Gy
Plan Total Fractions Prescribed: 16
Plan Total Prescribed Dose: 42.56 Gy
Reference Point Dosage Given to Date: 7.98 Gy
Reference Point Session Dosage Given: 2.66 Gy
Session Number: 3

## 2023-09-22 ENCOUNTER — Other Ambulatory Visit: Payer: Self-pay

## 2023-09-22 ENCOUNTER — Ambulatory Visit
Admission: RE | Admit: 2023-09-22 | Discharge: 2023-09-22 | Disposition: A | Discharge status: Home / Self Care | Payer: Medicare Other | Source: Ambulatory Visit | Attending: Radiation Oncology | Admitting: Radiation Oncology

## 2023-09-22 ENCOUNTER — Inpatient Hospital Stay: Payer: Medicare Other

## 2023-09-22 DIAGNOSIS — R293 Abnormal posture: Secondary | ICD-10-CM | POA: Insufficient documentation

## 2023-09-22 DIAGNOSIS — Z170 Estrogen receptor positive status [ER+]: Secondary | ICD-10-CM | POA: Insufficient documentation

## 2023-09-22 DIAGNOSIS — C50812 Malignant neoplasm of overlapping sites of left female breast: Secondary | ICD-10-CM | POA: Insufficient documentation

## 2023-09-22 DIAGNOSIS — C50412 Malignant neoplasm of upper-outer quadrant of left female breast: Secondary | ICD-10-CM | POA: Diagnosis not present

## 2023-09-22 DIAGNOSIS — Z510 Encounter for antineoplastic radiation therapy: Secondary | ICD-10-CM | POA: Diagnosis not present

## 2023-09-22 DIAGNOSIS — C50912 Malignant neoplasm of unspecified site of left female breast: Secondary | ICD-10-CM

## 2023-09-22 LAB — RAD ONC ARIA SESSION SUMMARY
Course Elapsed Days: 5
Plan Fractions Treated to Date: 4
Plan Prescribed Dose Per Fraction: 2.66 Gy
Plan Total Fractions Prescribed: 16
Plan Total Prescribed Dose: 42.56 Gy
Reference Point Dosage Given to Date: 10.64 Gy
Reference Point Session Dosage Given: 2.66 Gy
Session Number: 4

## 2023-09-22 LAB — CBC (CANCER CENTER ONLY)
HCT: 41 % (ref 36.0–46.0)
Hemoglobin: 13.3 g/dL (ref 12.0–15.0)
MCH: 29.8 pg (ref 26.0–34.0)
MCHC: 32.4 g/dL (ref 30.0–36.0)
MCV: 91.7 fL (ref 80.0–100.0)
Platelet Count: 286 10*3/uL (ref 150–400)
RBC: 4.47 MIL/uL (ref 3.87–5.11)
RDW: 14.2 % (ref 11.5–15.5)
WBC Count: 12.1 10*3/uL — ABNORMAL HIGH (ref 4.0–10.5)
nRBC: 0 % (ref 0.0–0.2)

## 2023-09-23 ENCOUNTER — Other Ambulatory Visit: Payer: Self-pay

## 2023-09-23 ENCOUNTER — Ambulatory Visit
Admission: RE | Admit: 2023-09-23 | Discharge: 2023-09-23 | Disposition: A | Discharge status: Home / Self Care | Payer: Medicare Other | Source: Ambulatory Visit | Attending: Radiation Oncology | Admitting: Radiation Oncology

## 2023-09-23 DIAGNOSIS — Z170 Estrogen receptor positive status [ER+]: Secondary | ICD-10-CM | POA: Diagnosis not present

## 2023-09-23 DIAGNOSIS — R293 Abnormal posture: Secondary | ICD-10-CM | POA: Diagnosis not present

## 2023-09-23 DIAGNOSIS — C50412 Malignant neoplasm of upper-outer quadrant of left female breast: Secondary | ICD-10-CM | POA: Diagnosis not present

## 2023-09-23 DIAGNOSIS — C50812 Malignant neoplasm of overlapping sites of left female breast: Secondary | ICD-10-CM | POA: Diagnosis not present

## 2023-09-23 DIAGNOSIS — Z510 Encounter for antineoplastic radiation therapy: Secondary | ICD-10-CM | POA: Diagnosis not present

## 2023-09-23 LAB — RAD ONC ARIA SESSION SUMMARY
Course Elapsed Days: 6
Plan Fractions Treated to Date: 5
Plan Prescribed Dose Per Fraction: 2.66 Gy
Plan Total Fractions Prescribed: 16
Plan Total Prescribed Dose: 42.56 Gy
Reference Point Dosage Given to Date: 13.3 Gy
Reference Point Session Dosage Given: 2.66 Gy
Session Number: 5

## 2023-09-24 ENCOUNTER — Other Ambulatory Visit: Payer: Self-pay

## 2023-09-24 ENCOUNTER — Ambulatory Visit
Admission: RE | Admit: 2023-09-24 | Discharge: 2023-09-24 | Disposition: A | Discharge status: Home / Self Care | Payer: Medicare Other | Source: Ambulatory Visit | Attending: Radiation Oncology | Admitting: Radiation Oncology

## 2023-09-24 DIAGNOSIS — Z170 Estrogen receptor positive status [ER+]: Secondary | ICD-10-CM | POA: Diagnosis not present

## 2023-09-24 DIAGNOSIS — C50812 Malignant neoplasm of overlapping sites of left female breast: Secondary | ICD-10-CM | POA: Diagnosis not present

## 2023-09-24 DIAGNOSIS — R293 Abnormal posture: Secondary | ICD-10-CM | POA: Diagnosis not present

## 2023-09-24 DIAGNOSIS — C50412 Malignant neoplasm of upper-outer quadrant of left female breast: Secondary | ICD-10-CM | POA: Diagnosis not present

## 2023-09-24 DIAGNOSIS — Z510 Encounter for antineoplastic radiation therapy: Secondary | ICD-10-CM | POA: Diagnosis not present

## 2023-09-24 LAB — RAD ONC ARIA SESSION SUMMARY
Course Elapsed Days: 7
Plan Fractions Treated to Date: 6
Plan Prescribed Dose Per Fraction: 2.66 Gy
Plan Total Fractions Prescribed: 16
Plan Total Prescribed Dose: 42.56 Gy
Reference Point Dosage Given to Date: 15.96 Gy
Reference Point Session Dosage Given: 2.66 Gy
Session Number: 6

## 2023-09-25 ENCOUNTER — Other Ambulatory Visit: Payer: Self-pay

## 2023-09-25 ENCOUNTER — Ambulatory Visit
Admission: RE | Admit: 2023-09-25 | Discharge: 2023-09-25 | Disposition: A | Discharge status: Home / Self Care | Payer: Medicare Other | Source: Ambulatory Visit | Attending: Radiation Oncology | Admitting: Radiation Oncology

## 2023-09-25 DIAGNOSIS — C50812 Malignant neoplasm of overlapping sites of left female breast: Secondary | ICD-10-CM | POA: Diagnosis not present

## 2023-09-25 DIAGNOSIS — C50412 Malignant neoplasm of upper-outer quadrant of left female breast: Secondary | ICD-10-CM | POA: Diagnosis not present

## 2023-09-25 DIAGNOSIS — R293 Abnormal posture: Secondary | ICD-10-CM | POA: Diagnosis not present

## 2023-09-25 DIAGNOSIS — Z510 Encounter for antineoplastic radiation therapy: Secondary | ICD-10-CM | POA: Diagnosis not present

## 2023-09-25 DIAGNOSIS — Z170 Estrogen receptor positive status [ER+]: Secondary | ICD-10-CM | POA: Diagnosis not present

## 2023-09-25 LAB — RAD ONC ARIA SESSION SUMMARY
Course Elapsed Days: 8
Plan Fractions Treated to Date: 7
Plan Prescribed Dose Per Fraction: 2.66 Gy
Plan Total Fractions Prescribed: 16
Plan Total Prescribed Dose: 42.56 Gy
Reference Point Dosage Given to Date: 18.62 Gy
Reference Point Session Dosage Given: 2.66 Gy
Session Number: 7

## 2023-09-26 ENCOUNTER — Other Ambulatory Visit: Payer: Self-pay

## 2023-09-26 ENCOUNTER — Ambulatory Visit
Admission: RE | Admit: 2023-09-26 | Discharge: 2023-09-26 | Disposition: A | Discharge status: Home / Self Care | Payer: Medicare Other | Source: Ambulatory Visit | Attending: Oncology | Admitting: Oncology

## 2023-09-26 ENCOUNTER — Ambulatory Visit
Admission: RE | Admit: 2023-09-26 | Discharge: 2023-09-26 | Disposition: A | Discharge status: Home / Self Care | Payer: Medicare Other | Source: Ambulatory Visit | Attending: Radiation Oncology | Admitting: Radiation Oncology

## 2023-09-26 DIAGNOSIS — Z7189 Other specified counseling: Secondary | ICD-10-CM | POA: Insufficient documentation

## 2023-09-26 DIAGNOSIS — C50812 Malignant neoplasm of overlapping sites of left female breast: Secondary | ICD-10-CM | POA: Diagnosis not present

## 2023-09-26 DIAGNOSIS — M810 Age-related osteoporosis without current pathological fracture: Secondary | ICD-10-CM | POA: Diagnosis not present

## 2023-09-26 DIAGNOSIS — Z170 Estrogen receptor positive status [ER+]: Secondary | ICD-10-CM | POA: Diagnosis not present

## 2023-09-26 DIAGNOSIS — Z13820 Encounter for screening for osteoporosis: Secondary | ICD-10-CM | POA: Insufficient documentation

## 2023-09-26 DIAGNOSIS — C50412 Malignant neoplasm of upper-outer quadrant of left female breast: Secondary | ICD-10-CM | POA: Diagnosis not present

## 2023-09-26 DIAGNOSIS — Z510 Encounter for antineoplastic radiation therapy: Secondary | ICD-10-CM | POA: Diagnosis not present

## 2023-09-26 DIAGNOSIS — Z780 Asymptomatic menopausal state: Secondary | ICD-10-CM | POA: Diagnosis not present

## 2023-09-26 DIAGNOSIS — R293 Abnormal posture: Secondary | ICD-10-CM | POA: Diagnosis not present

## 2023-09-26 DIAGNOSIS — M85852 Other specified disorders of bone density and structure, left thigh: Secondary | ICD-10-CM | POA: Diagnosis not present

## 2023-09-26 LAB — RAD ONC ARIA SESSION SUMMARY
Course Elapsed Days: 9
Plan Fractions Treated to Date: 8
Plan Prescribed Dose Per Fraction: 2.66 Gy
Plan Total Fractions Prescribed: 16
Plan Total Prescribed Dose: 42.56 Gy
Reference Point Dosage Given to Date: 21.28 Gy
Reference Point Session Dosage Given: 2.66 Gy
Session Number: 8

## 2023-09-29 ENCOUNTER — Ambulatory Visit
Admission: RE | Admit: 2023-09-29 | Discharge: 2023-09-29 | Disposition: A | Discharge status: Home / Self Care | Payer: Medicare Other | Source: Ambulatory Visit | Attending: Radiation Oncology | Admitting: Radiation Oncology

## 2023-09-29 ENCOUNTER — Other Ambulatory Visit: Payer: Self-pay

## 2023-09-29 DIAGNOSIS — Z170 Estrogen receptor positive status [ER+]: Secondary | ICD-10-CM | POA: Diagnosis not present

## 2023-09-29 DIAGNOSIS — R293 Abnormal posture: Secondary | ICD-10-CM | POA: Diagnosis not present

## 2023-09-29 DIAGNOSIS — C50412 Malignant neoplasm of upper-outer quadrant of left female breast: Secondary | ICD-10-CM | POA: Diagnosis not present

## 2023-09-29 DIAGNOSIS — Z510 Encounter for antineoplastic radiation therapy: Secondary | ICD-10-CM | POA: Diagnosis not present

## 2023-09-29 DIAGNOSIS — C50812 Malignant neoplasm of overlapping sites of left female breast: Secondary | ICD-10-CM | POA: Diagnosis not present

## 2023-09-29 LAB — RAD ONC ARIA SESSION SUMMARY
Course Elapsed Days: 12
Plan Fractions Treated to Date: 9
Plan Prescribed Dose Per Fraction: 2.66 Gy
Plan Total Fractions Prescribed: 16
Plan Total Prescribed Dose: 42.56 Gy
Reference Point Dosage Given to Date: 23.94 Gy
Reference Point Session Dosage Given: 2.66 Gy
Session Number: 9

## 2023-09-30 ENCOUNTER — Other Ambulatory Visit: Payer: Self-pay

## 2023-09-30 ENCOUNTER — Ambulatory Visit
Admission: RE | Admit: 2023-09-30 | Discharge: 2023-09-30 | Disposition: A | Discharge status: Home / Self Care | Payer: Medicare Other | Source: Ambulatory Visit | Attending: Radiation Oncology | Admitting: Radiation Oncology

## 2023-09-30 DIAGNOSIS — Z170 Estrogen receptor positive status [ER+]: Secondary | ICD-10-CM | POA: Diagnosis not present

## 2023-09-30 DIAGNOSIS — Z510 Encounter for antineoplastic radiation therapy: Secondary | ICD-10-CM | POA: Diagnosis not present

## 2023-09-30 DIAGNOSIS — C50412 Malignant neoplasm of upper-outer quadrant of left female breast: Secondary | ICD-10-CM | POA: Diagnosis not present

## 2023-09-30 DIAGNOSIS — C50812 Malignant neoplasm of overlapping sites of left female breast: Secondary | ICD-10-CM | POA: Diagnosis not present

## 2023-09-30 DIAGNOSIS — R293 Abnormal posture: Secondary | ICD-10-CM | POA: Diagnosis not present

## 2023-09-30 LAB — RAD ONC ARIA SESSION SUMMARY
Course Elapsed Days: 13
Plan Fractions Treated to Date: 10
Plan Prescribed Dose Per Fraction: 2.66 Gy
Plan Total Fractions Prescribed: 16
Plan Total Prescribed Dose: 42.56 Gy
Reference Point Dosage Given to Date: 26.6 Gy
Reference Point Session Dosage Given: 2.66 Gy
Session Number: 10

## 2023-10-01 ENCOUNTER — Ambulatory Visit
Admission: RE | Admit: 2023-10-01 | Discharge: 2023-10-01 | Disposition: A | Discharge status: Home / Self Care | Payer: Medicare Other | Source: Ambulatory Visit | Attending: Radiation Oncology | Admitting: Radiation Oncology

## 2023-10-01 ENCOUNTER — Other Ambulatory Visit: Payer: Self-pay

## 2023-10-01 ENCOUNTER — Inpatient Hospital Stay: Payer: Medicare Other | Admitting: Occupational Therapy

## 2023-10-01 DIAGNOSIS — R293 Abnormal posture: Secondary | ICD-10-CM

## 2023-10-01 DIAGNOSIS — C50812 Malignant neoplasm of overlapping sites of left female breast: Secondary | ICD-10-CM | POA: Diagnosis not present

## 2023-10-01 DIAGNOSIS — C50412 Malignant neoplasm of upper-outer quadrant of left female breast: Secondary | ICD-10-CM | POA: Diagnosis not present

## 2023-10-01 DIAGNOSIS — Z510 Encounter for antineoplastic radiation therapy: Secondary | ICD-10-CM | POA: Diagnosis not present

## 2023-10-01 DIAGNOSIS — Z170 Estrogen receptor positive status [ER+]: Secondary | ICD-10-CM | POA: Diagnosis not present

## 2023-10-01 LAB — RAD ONC ARIA SESSION SUMMARY
Course Elapsed Days: 14
Plan Fractions Treated to Date: 11
Plan Prescribed Dose Per Fraction: 2.66 Gy
Plan Total Fractions Prescribed: 16
Plan Total Prescribed Dose: 42.56 Gy
Reference Point Dosage Given to Date: 29.26 Gy
Reference Point Session Dosage Given: 2.66 Gy
Session Number: 11

## 2023-10-01 NOTE — Addendum Note (Signed)
Addended by: Oletta Cohn on: 10/01/2023 05:05 PM   Modules accepted: Orders

## 2023-10-01 NOTE — Therapy (Addendum)
OUTPATIENT OCCUPATIONAL THERAPY BREAST CANCER POST OP FOLLOW UP   Patient Name: Stephanie Hess MRN: [id number] DOB:12-29-1950, 72 y.o., female Today's Date: 10/01/2023  END OF SESSION:  OT End of Session - 10/01/23 1346     Visit Number 3    Number of Visits 3    Date for OT Re-Evaluation 10/01/23    OT Start Time 1025    OT Stop Time 1050    OT Time Calculation (min) 25 min    Activity Tolerance Patient tolerated treatment well    Behavior During Therapy WFL for tasks assessed/performed             Past Medical History:  Diagnosis Date   Cancer (HCC)    COPD (chronic obstructive pulmonary disease) (HCC)    Diverticula of colon    GERD (gastroesophageal reflux disease)    Hyperlipidemia    Renal insufficiency    2009 or 2010   Past Surgical History:  Procedure Laterality Date   APPENDECTOMY     AXILLARY SENTINEL NODE BIOPSY Left 08/06/2023   Procedure: AXILLARY SENTINEL NODE BIOPSY;  Surgeon: Campbell Lerner, MD;  Location: [facility];  Service: General;  Laterality: Left;   BREAST BIOPSY Left 07/02/2023   [place] LT BREAST BX W LOC DEV 1ST LESION IMG BX SPEC US GUIDE 07/02/2023 [facility]   BREAST LUMPECTOMY WITH RADIOFREQUENCY TAG IDENTIFICATION Left 08/06/2023   Procedure: BREAST LUMPECTOMY WITH RADIOFREQUENCY TAG IDENTIFICATION;  Surgeon: Campbell Lerner, MD;  Location: [facility];  Service: General;  Laterality: Left;   CESAREAN SECTION     ECTOPIC PREGNANCY SURGERY     Patient Active Problem List   Diagnosis Date Noted   Malignant neoplasm of upper-outer quadrant of left breast in female, 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/2023   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) 07/08/2023   Aortic atherosclerosis (HCC) 05/23/2023   BMI 30.0-30.9,adult 03/07/2022   Generalized anxiety disorder 08/19/2021   Chronic obstructive pulmonary disease (HCC) 07/13/2021   Nicotine abuse 03/03/2018   Mild intermittent asthma without complication 03/03/2018    Pulmonary fibrosis (HCC) 03/03/2018   GERD (gastroesophageal reflux disease) 03/03/2018   Hyperlipidemia, mixed 05/15/2017   Vitamin D deficiency 05/15/2017    PCP: Jairo Ben PA  REFERRING PROVIDER: DR Glade Stanford DIAG s/p L lumpectomy  THERAPY DIAG:  Abnormal posture  Rationale for Evaluation and Treatment: Rehabilitation  ONSET DATE: 08/06/23  SUBJECTIVE:  SUBJECTIVE STATEMENT: Doing much better- my motion is better and I am over 1/2 way with radiation - daughter not here today   PERTINENT HISTORY:  08/19/23  s/p appt with DR Claudine Mouton - -Anticipate initiation of radiation therapy, oral estrogen blockade.  Follow-up left breast imaging in 6 months for establishment of a new baseline.  And long-term follow-up.     PATIENT GOALS:  Reassess how my recovery is going related to arm function, pain, and swelling.  PAIN:  Are you having pain? No  PRECAUTIONS: Recent Surgery, left UE Lymphedema risk low,   ACTIVITY LEVEL / LEISURE:  Likes to cook , around the house doing tasks and watch tv    OBJECTIVE:    OBSERVATIONS: Incision healed well - denies pain -except R upper traps since yesterday  POSTURE:  Rounded shoulders- AROM  WNL this date - great progress compare to last time - Pt to cont with same HEP thru out and after radiation to maintain motion -and increase functional tasks around the house symptom free.  Cont AAROM shoulder flexion and ABD on wall - 2 x day - even thru radiation to maintain her motion and decrease compensating with upper traps Scapula retraction Strength 4+/5   LYMPHEDEMA ASSESSMENT:   LYMPHEDEMA ASSESSMENTS:    LANDMARK RIGHT   08/27/23  10 cm proximal to olecranon process 30.5   Olecranon process  26  10 cm proximal to ulnar styloid process 22.5   Just  proximal to ulnar styloid process  16.8  Across hand at thumb web space    At base of 2nd digit    (Blank rows = not tested)   LANDMARK LEFT   08/27/23 L 10/01/23  10 cm proximal to olecranon process  33 30  Olecranon process 26  25.5  10 cm proximal to ulnar styloid process 22.5  22.5  Just proximal to ulnar styloid process 17  17  Across hand at thumb web space     At base of 2nd digit     (Blank rows = not tested)   L-DEX LYMPHEDEMA SCREENING: L-dex score for baseline 1.1  L UE at risk and R hand dominant    The patient was assessed using the L-Dex machine at eval to produce a lymphedema index baseline score. The patient will be reassessed on a regular basis (typically every 3 months) to obtain new L-Dex scores. If the score is > 6.5 points away from his/her baseline score indicating onset of subclinical lymphedema, it will be recommended to wear a compression garment for 4 weeks, 12 hours per day and then be reassessed. If the score continues to be > 6.5 points from baseline at reassessment, we will initiate lymphedema treatment. Assessing in this manner has a 95% rate of preventing clinically significant lymphedema.   L-DEX FLOWSHEETS - 10/01/23 1300       L-DEX LYMPHEDEMA SCREENING   L-DEX MEASUREMENT EXTREMITY Upper Extremity    POSITION  Standing    DOMINANT SIDE Right    At Risk Side Left    BASELINE SCORE (UNILATERAL) 1.1    L-DEX SCORE (UNILATERAL) 1    VALUE CHANGE (UNILAT) -0.1                  Surgery type/Date: 08/06/23 Number of lymph nodes removed: 1 Current/past treatment (chemo, radiation, hormone therapy): Radiation - but no chemo Other symptoms:  Heaviness/tightness No Pain No Pitting edema No Infections No Decreased scar mobility No Stemmer sign No  PATIENT EDUCATION:  Education details: HEP for ROM  Person educated: Patient and Child(ren) Education method: Explanation, Demonstration, and Verbal cues Education comprehension: verbalized  understanding and returned demonstration       ASSESSMENT:  CLINICAL IMPRESSION: Pt present about 10 wks s/p L lumpectomy - risk for lymphedema low- AROM for L shoulder  WNL. Pt with rounded shoulders -review HEP for scapula retraction and  to cont with AAROM for L shoulder during radiation to maintain her motion. Circumference of bilateral UE taken - WNL - and SOZO WNL .  Pt will benefit from skilled therapeutic intervention to improve  Decreased knowledge of precautions, impaired UE functional use, pain, decreased ROM, postural dysfunction.   OT treatment/interventions: ADL/Self care home management, 97110-Therapeutic exercises, 97535- Self Care, [id number]- Manual therapy, Patient/Family education, and Scar mobilization   GOALS: Goals reviewed with patient? Yes  LONG TERM GOALS:  (STG=LTG)  GOALS Name Target Date  Goal status  1 Pt will demonstrate she has regained full shoulder ROM and function post operatively compared to baselines and no sign of lymphedema.  Baseline: 10/01/23 MET                    PLAN:  OT FREQUENCY/DURATION: 1 visit 1 wk   PLAN FOR NEXT SESSION: 3 month screen for SOZO with breast navigator        Home exercise Program Continue doing the exercises you were given until you feel like you can do them without feeling any tightness at the end.   Walking Program Studies show that 30 minutes of walking per day (fast enough to elevate your heart rate) can significantly reduce the risk of a cancer recurrence. If you can't walk due to other medical reasons, we encourage you to find another activity you could do (like a stationary bike or water exercise).  Posture After breast cancer surgery, people frequently sit with rounded shoulders posture because it puts their incisions on slack and feels better. If you sit like this and scar tissue forms in that position, you can become very tight and have pain sitting or standing with good posture. Try to be aware  of your posture and sit and stand up tall to heal properly.  Follow up OT: It is recommended you return every 3 months for the first 3 years following surgery to be assessed on the SOZO machine for an L-Dex score. This helps prevent clinically significant lymphedema in 95% of patients. These follow up screens are 10 minute appointments that you are not billed for.  Oletta Cohn, OTR/L,CLT 10/01/2023, 1:48 PM

## 2023-10-02 ENCOUNTER — Ambulatory Visit
Admission: RE | Admit: 2023-10-02 | Discharge: 2023-10-02 | Disposition: A | Discharge status: Home / Self Care | Payer: Medicare Other | Source: Ambulatory Visit | Attending: Radiation Oncology | Admitting: Radiation Oncology

## 2023-10-02 ENCOUNTER — Other Ambulatory Visit: Payer: Self-pay

## 2023-10-02 DIAGNOSIS — C50412 Malignant neoplasm of upper-outer quadrant of left female breast: Secondary | ICD-10-CM | POA: Diagnosis not present

## 2023-10-02 DIAGNOSIS — R293 Abnormal posture: Secondary | ICD-10-CM | POA: Diagnosis not present

## 2023-10-02 DIAGNOSIS — Z170 Estrogen receptor positive status [ER+]: Secondary | ICD-10-CM | POA: Diagnosis not present

## 2023-10-02 DIAGNOSIS — Z510 Encounter for antineoplastic radiation therapy: Secondary | ICD-10-CM | POA: Diagnosis not present

## 2023-10-02 DIAGNOSIS — C50812 Malignant neoplasm of overlapping sites of left female breast: Secondary | ICD-10-CM | POA: Diagnosis not present

## 2023-10-02 LAB — RAD ONC ARIA SESSION SUMMARY
Course Elapsed Days: 15
Plan Fractions Treated to Date: 12
Plan Prescribed Dose Per Fraction: 2.66 Gy
Plan Total Fractions Prescribed: 16
Plan Total Prescribed Dose: 42.56 Gy
Reference Point Dosage Given to Date: 31.92 Gy
Reference Point Session Dosage Given: 2.66 Gy
Session Number: 12

## 2023-10-03 ENCOUNTER — Other Ambulatory Visit: Payer: Self-pay

## 2023-10-03 ENCOUNTER — Ambulatory Visit
Admission: RE | Admit: 2023-10-03 | Discharge: 2023-10-03 | Disposition: A | Discharge status: Home / Self Care | Payer: Medicare Other | Source: Ambulatory Visit | Attending: Radiation Oncology | Admitting: Radiation Oncology

## 2023-10-03 DIAGNOSIS — Z170 Estrogen receptor positive status [ER+]: Secondary | ICD-10-CM | POA: Diagnosis not present

## 2023-10-03 DIAGNOSIS — Z510 Encounter for antineoplastic radiation therapy: Secondary | ICD-10-CM | POA: Diagnosis not present

## 2023-10-03 DIAGNOSIS — C50812 Malignant neoplasm of overlapping sites of left female breast: Secondary | ICD-10-CM | POA: Diagnosis not present

## 2023-10-03 DIAGNOSIS — C50412 Malignant neoplasm of upper-outer quadrant of left female breast: Secondary | ICD-10-CM | POA: Diagnosis not present

## 2023-10-03 DIAGNOSIS — R293 Abnormal posture: Secondary | ICD-10-CM | POA: Diagnosis not present

## 2023-10-03 LAB — RAD ONC ARIA SESSION SUMMARY
Course Elapsed Days: 16
Plan Fractions Treated to Date: 13
Plan Prescribed Dose Per Fraction: 2.66 Gy
Plan Total Fractions Prescribed: 16
Plan Total Prescribed Dose: 42.56 Gy
Reference Point Dosage Given to Date: 34.58 Gy
Reference Point Session Dosage Given: 2.66 Gy
Session Number: 13

## 2023-10-06 ENCOUNTER — Other Ambulatory Visit: Payer: Self-pay

## 2023-10-06 ENCOUNTER — Inpatient Hospital Stay: Payer: Medicare Other

## 2023-10-06 ENCOUNTER — Ambulatory Visit
Admission: RE | Admit: 2023-10-06 | Discharge: 2023-10-06 | Disposition: A | Discharge status: Home / Self Care | Payer: Medicare Other | Source: Ambulatory Visit | Attending: Radiation Oncology | Admitting: Radiation Oncology

## 2023-10-06 DIAGNOSIS — Z510 Encounter for antineoplastic radiation therapy: Secondary | ICD-10-CM | POA: Diagnosis not present

## 2023-10-06 DIAGNOSIS — C50412 Malignant neoplasm of upper-outer quadrant of left female breast: Secondary | ICD-10-CM | POA: Diagnosis not present

## 2023-10-06 DIAGNOSIS — Z170 Estrogen receptor positive status [ER+]: Secondary | ICD-10-CM | POA: Diagnosis not present

## 2023-10-06 DIAGNOSIS — C50812 Malignant neoplasm of overlapping sites of left female breast: Secondary | ICD-10-CM | POA: Diagnosis not present

## 2023-10-06 DIAGNOSIS — C50912 Malignant neoplasm of unspecified site of left female breast: Secondary | ICD-10-CM

## 2023-10-06 DIAGNOSIS — R293 Abnormal posture: Secondary | ICD-10-CM | POA: Diagnosis not present

## 2023-10-06 LAB — RAD ONC ARIA SESSION SUMMARY
Course Elapsed Days: 19
Plan Fractions Treated to Date: 14
Plan Prescribed Dose Per Fraction: 2.66 Gy
Plan Total Fractions Prescribed: 16
Plan Total Prescribed Dose: 42.56 Gy
Reference Point Dosage Given to Date: 37.24 Gy
Reference Point Session Dosage Given: 2.66 Gy
Session Number: 14

## 2023-10-06 LAB — CBC (CANCER CENTER ONLY)
HCT: 42.5 % (ref 36.0–46.0)
Hemoglobin: 14 g/dL (ref 12.0–15.0)
MCH: 30.2 pg (ref 26.0–34.0)
MCHC: 32.9 g/dL (ref 30.0–36.0)
MCV: 91.8 fL (ref 80.0–100.0)
Platelet Count: 280 10*3/uL (ref 150–400)
RBC: 4.63 MIL/uL (ref 3.87–5.11)
RDW: 14.2 % (ref 11.5–15.5)
WBC Count: 10.6 10*3/uL — ABNORMAL HIGH (ref 4.0–10.5)
nRBC: 0 % (ref 0.0–0.2)

## 2023-10-07 ENCOUNTER — Ambulatory Visit
Admission: RE | Admit: 2023-10-07 | Discharge: 2023-10-07 | Disposition: A | Discharge status: Home / Self Care | Payer: Medicare Other | Source: Ambulatory Visit | Attending: Radiation Oncology | Admitting: Radiation Oncology

## 2023-10-07 ENCOUNTER — Other Ambulatory Visit: Payer: Self-pay

## 2023-10-07 DIAGNOSIS — C50412 Malignant neoplasm of upper-outer quadrant of left female breast: Secondary | ICD-10-CM | POA: Diagnosis not present

## 2023-10-07 DIAGNOSIS — Z510 Encounter for antineoplastic radiation therapy: Secondary | ICD-10-CM | POA: Diagnosis not present

## 2023-10-07 DIAGNOSIS — C50812 Malignant neoplasm of overlapping sites of left female breast: Secondary | ICD-10-CM | POA: Diagnosis not present

## 2023-10-07 DIAGNOSIS — Z170 Estrogen receptor positive status [ER+]: Secondary | ICD-10-CM | POA: Diagnosis not present

## 2023-10-07 DIAGNOSIS — R293 Abnormal posture: Secondary | ICD-10-CM | POA: Diagnosis not present

## 2023-10-07 LAB — RAD ONC ARIA SESSION SUMMARY
Course Elapsed Days: 20
Plan Fractions Treated to Date: 15
Plan Prescribed Dose Per Fraction: 2.66 Gy
Plan Total Fractions Prescribed: 16
Plan Total Prescribed Dose: 42.56 Gy
Reference Point Dosage Given to Date: 39.9 Gy
Reference Point Session Dosage Given: 2.66 Gy
Session Number: 15

## 2023-10-08 ENCOUNTER — Other Ambulatory Visit: Payer: Self-pay

## 2023-10-08 ENCOUNTER — Ambulatory Visit: Admission: RE | Admit: 2023-10-08 | Payer: Medicare Other | Source: Ambulatory Visit

## 2023-10-08 ENCOUNTER — Ambulatory Visit
Admission: RE | Admit: 2023-10-08 | Discharge: 2023-10-08 | Disposition: A | Discharge status: Home / Self Care | Payer: Medicare Other | Source: Ambulatory Visit | Attending: Radiation Oncology | Admitting: Radiation Oncology

## 2023-10-08 DIAGNOSIS — C50412 Malignant neoplasm of upper-outer quadrant of left female breast: Secondary | ICD-10-CM | POA: Diagnosis not present

## 2023-10-08 DIAGNOSIS — Z510 Encounter for antineoplastic radiation therapy: Secondary | ICD-10-CM | POA: Diagnosis not present

## 2023-10-08 DIAGNOSIS — R293 Abnormal posture: Secondary | ICD-10-CM | POA: Diagnosis not present

## 2023-10-08 DIAGNOSIS — Z170 Estrogen receptor positive status [ER+]: Secondary | ICD-10-CM | POA: Diagnosis not present

## 2023-10-08 DIAGNOSIS — C50812 Malignant neoplasm of overlapping sites of left female breast: Secondary | ICD-10-CM | POA: Diagnosis not present

## 2023-10-08 LAB — RAD ONC ARIA SESSION SUMMARY
Course Elapsed Days: 21
Plan Fractions Treated to Date: 16
Plan Prescribed Dose Per Fraction: 2.66 Gy
Plan Total Fractions Prescribed: 16
Plan Total Prescribed Dose: 42.56 Gy
Reference Point Dosage Given to Date: 42.56 Gy
Reference Point Session Dosage Given: 2.66 Gy
Session Number: 16

## 2023-10-13 ENCOUNTER — Other Ambulatory Visit: Payer: Self-pay

## 2023-10-13 ENCOUNTER — Ambulatory Visit
Admission: RE | Admit: 2023-10-13 | Discharge: 2023-10-13 | Disposition: A | Discharge status: Home / Self Care | Payer: Medicare Other | Source: Ambulatory Visit | Attending: Radiation Oncology | Admitting: Radiation Oncology

## 2023-10-13 DIAGNOSIS — C50412 Malignant neoplasm of upper-outer quadrant of left female breast: Secondary | ICD-10-CM | POA: Insufficient documentation

## 2023-10-13 DIAGNOSIS — C50812 Malignant neoplasm of overlapping sites of left female breast: Secondary | ICD-10-CM | POA: Insufficient documentation

## 2023-10-13 DIAGNOSIS — Z170 Estrogen receptor positive status [ER+]: Secondary | ICD-10-CM | POA: Insufficient documentation

## 2023-10-13 DIAGNOSIS — R293 Abnormal posture: Secondary | ICD-10-CM | POA: Insufficient documentation

## 2023-10-13 DIAGNOSIS — Z510 Encounter for antineoplastic radiation therapy: Secondary | ICD-10-CM | POA: Diagnosis not present

## 2023-10-13 LAB — RAD ONC ARIA SESSION SUMMARY
Course Elapsed Days: 26
Plan Fractions Treated to Date: 1
Plan Prescribed Dose Per Fraction: 2 Gy
Plan Total Fractions Prescribed: 5
Plan Total Prescribed Dose: 10 Gy
Reference Point Dosage Given to Date: 2 Gy
Reference Point Session Dosage Given: 2 Gy
Session Number: 17

## 2023-10-14 ENCOUNTER — Other Ambulatory Visit: Payer: Self-pay

## 2023-10-14 ENCOUNTER — Other Ambulatory Visit: Payer: Self-pay | Admitting: *Deleted

## 2023-10-14 ENCOUNTER — Ambulatory Visit
Admission: RE | Admit: 2023-10-14 | Discharge: 2023-10-14 | Disposition: A | Discharge status: Home / Self Care | Payer: Medicare Other | Source: Ambulatory Visit | Attending: Radiation Oncology | Admitting: Radiation Oncology

## 2023-10-14 DIAGNOSIS — C50412 Malignant neoplasm of upper-outer quadrant of left female breast: Secondary | ICD-10-CM | POA: Diagnosis not present

## 2023-10-14 DIAGNOSIS — Z510 Encounter for antineoplastic radiation therapy: Secondary | ICD-10-CM | POA: Diagnosis not present

## 2023-10-14 DIAGNOSIS — R293 Abnormal posture: Secondary | ICD-10-CM | POA: Diagnosis not present

## 2023-10-14 DIAGNOSIS — J449 Chronic obstructive pulmonary disease, unspecified: Secondary | ICD-10-CM | POA: Diagnosis not present

## 2023-10-14 DIAGNOSIS — Z170 Estrogen receptor positive status [ER+]: Secondary | ICD-10-CM | POA: Diagnosis not present

## 2023-10-14 DIAGNOSIS — R5381 Other malaise: Secondary | ICD-10-CM | POA: Diagnosis not present

## 2023-10-14 DIAGNOSIS — J441 Chronic obstructive pulmonary disease with (acute) exacerbation: Secondary | ICD-10-CM | POA: Diagnosis not present

## 2023-10-14 DIAGNOSIS — C50812 Malignant neoplasm of overlapping sites of left female breast: Secondary | ICD-10-CM | POA: Diagnosis not present

## 2023-10-14 LAB — RAD ONC ARIA SESSION SUMMARY
Course Elapsed Days: 27
Plan Fractions Treated to Date: 2
Plan Prescribed Dose Per Fraction: 2 Gy
Plan Total Fractions Prescribed: 5
Plan Total Prescribed Dose: 10 Gy
Reference Point Dosage Given to Date: 4 Gy
Reference Point Session Dosage Given: 2 Gy
Session Number: 18

## 2023-10-14 MED ORDER — SILVER SULFADIAZINE 1 % EX CREA: 1.0000 | TOPICAL_CREAM | Freq: Every day | CUTANEOUS | 0 refills | Status: DC

## 2023-10-15 ENCOUNTER — Ambulatory Visit
Admission: RE | Admit: 2023-10-15 | Discharge: 2023-10-15 | Disposition: A | Discharge status: Home / Self Care | Payer: Medicare Other | Source: Ambulatory Visit | Attending: Radiation Oncology | Admitting: Radiation Oncology

## 2023-10-15 ENCOUNTER — Other Ambulatory Visit: Payer: Self-pay

## 2023-10-15 DIAGNOSIS — R293 Abnormal posture: Secondary | ICD-10-CM | POA: Diagnosis not present

## 2023-10-15 DIAGNOSIS — Z170 Estrogen receptor positive status [ER+]: Secondary | ICD-10-CM | POA: Diagnosis not present

## 2023-10-15 DIAGNOSIS — Z510 Encounter for antineoplastic radiation therapy: Secondary | ICD-10-CM | POA: Diagnosis not present

## 2023-10-15 DIAGNOSIS — C50412 Malignant neoplasm of upper-outer quadrant of left female breast: Secondary | ICD-10-CM | POA: Diagnosis not present

## 2023-10-15 DIAGNOSIS — C50812 Malignant neoplasm of overlapping sites of left female breast: Secondary | ICD-10-CM | POA: Diagnosis not present

## 2023-10-15 LAB — RAD ONC ARIA SESSION SUMMARY
Course Elapsed Days: 28
Plan Fractions Treated to Date: 3
Plan Prescribed Dose Per Fraction: 2 Gy
Plan Total Fractions Prescribed: 5
Plan Total Prescribed Dose: 10 Gy
Reference Point Dosage Given to Date: 6 Gy
Reference Point Session Dosage Given: 2 Gy
Session Number: 19

## 2023-10-16 ENCOUNTER — Other Ambulatory Visit: Payer: Self-pay

## 2023-10-16 ENCOUNTER — Ambulatory Visit
Admission: RE | Admit: 2023-10-16 | Discharge: 2023-10-16 | Disposition: A | Discharge status: Home / Self Care | Payer: Medicare Other | Source: Ambulatory Visit | Attending: Radiation Oncology | Admitting: Radiation Oncology

## 2023-10-16 DIAGNOSIS — Z510 Encounter for antineoplastic radiation therapy: Secondary | ICD-10-CM | POA: Diagnosis not present

## 2023-10-16 DIAGNOSIS — Z170 Estrogen receptor positive status [ER+]: Secondary | ICD-10-CM | POA: Diagnosis not present

## 2023-10-16 DIAGNOSIS — C50812 Malignant neoplasm of overlapping sites of left female breast: Secondary | ICD-10-CM | POA: Diagnosis not present

## 2023-10-16 DIAGNOSIS — C50412 Malignant neoplasm of upper-outer quadrant of left female breast: Secondary | ICD-10-CM | POA: Diagnosis not present

## 2023-10-16 DIAGNOSIS — R293 Abnormal posture: Secondary | ICD-10-CM | POA: Diagnosis not present

## 2023-10-16 LAB — RAD ONC ARIA SESSION SUMMARY
Course Elapsed Days: 29
Plan Fractions Treated to Date: 4
Plan Prescribed Dose Per Fraction: 2 Gy
Plan Total Fractions Prescribed: 5
Plan Total Prescribed Dose: 10 Gy
Reference Point Dosage Given to Date: 8 Gy
Reference Point Session Dosage Given: 2 Gy
Session Number: 20

## 2023-10-17 ENCOUNTER — Other Ambulatory Visit: Payer: Self-pay

## 2023-10-17 ENCOUNTER — Encounter: Payer: Self-pay | Admitting: *Deleted

## 2023-10-17 ENCOUNTER — Ambulatory Visit
Admission: RE | Admit: 2023-10-17 | Discharge: 2023-10-17 | Disposition: A | Discharge status: Home / Self Care | Payer: Medicare Other | Source: Ambulatory Visit | Attending: Radiation Oncology | Admitting: Radiation Oncology

## 2023-10-17 DIAGNOSIS — Z170 Estrogen receptor positive status [ER+]: Secondary | ICD-10-CM | POA: Diagnosis not present

## 2023-10-17 DIAGNOSIS — R293 Abnormal posture: Secondary | ICD-10-CM | POA: Diagnosis not present

## 2023-10-17 DIAGNOSIS — C50412 Malignant neoplasm of upper-outer quadrant of left female breast: Secondary | ICD-10-CM | POA: Diagnosis not present

## 2023-10-17 DIAGNOSIS — C50812 Malignant neoplasm of overlapping sites of left female breast: Secondary | ICD-10-CM | POA: Diagnosis not present

## 2023-10-17 DIAGNOSIS — Z510 Encounter for antineoplastic radiation therapy: Secondary | ICD-10-CM | POA: Diagnosis not present

## 2023-10-17 LAB — RAD ONC ARIA SESSION SUMMARY
Course Elapsed Days: 30
Plan Fractions Treated to Date: 5
Plan Prescribed Dose Per Fraction: 2 Gy
Plan Total Fractions Prescribed: 5
Plan Total Prescribed Dose: 10 Gy
Reference Point Dosage Given to Date: 10 Gy
Reference Point Session Dosage Given: 2 Gy
Session Number: 21

## 2023-10-20 NOTE — Radiation Completion Notes (Signed)
Patient Name: Stephanie Hess, Stephanie Hess MRN: [id number] Date of Birth: [birth year] Referring Physician: Saralyn Pilar, M.D. Date of Service: 2023-10-20 Radiation Oncologist: Carmina Miller, M.D. 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  There was an indeterminate 7 mm developing asymmetry in the area adjacent to dystrophic calcifications.  There was no suspicious left axillary adenopathy.  Patient underwent biopsy which was positive for lobular carcinoma.  MRI scan showed 1.2 cm irregular enhancing mass in the left breast reflecting biopsy-proven lobular carcinoma there is also indeterminate 4 mm enhancing mass in the inferior left breast near 6:00.  Core biopsy of the 4 mm mass was negative for malignancy.  Tumor spanned 0.9 cm overall grade 2.  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  She has done well postoperatively.  Oncotype DX is pending.  She specifically denies breast tenderness cough or bone pain.      ==========DELIVERED PLANS==========  First Treatment Date: 2023-09-17 Last Treatment Date: 2023-10-17   Plan Name: Breast_L Site: Breast, Left Technique: 3D Mode: Photon Dose Per Fraction: 2.66 Gy Prescribed Dose (Delivered / Prescribed): 42.56 Gy / 42.56 Gy Prescribed Fxs (Delivered / Prescribed): 16 / 16   Plan Name: Breast_Lt_Bst Site: Breast, Left Technique: 3D Mode: Photon Dose Per Fraction: 2 Gy Prescribed Dose (Delivered / Prescribed): 10 Gy / 10 Gy Prescribed Fxs  (Delivered / Prescribed): 5 / 5     ==========ON TREATMENT VISIT DATES========== 2023-09-23, 2023-09-30, 2023-10-07, 2023-10-14     ==========UPCOMING VISITS========== 01/14/2024 [facility] EST PT 15 Creig Hines, MD  01/14/2024 [facility] LAB [facility]  12/15/2023 [facility] SOZO SCREEN Hulen Luster, RN  11/24/2023 [facility] Melida Quitter, PA  11/20/2023 [facility] AWV, SEQUENTIAL [facility]AN[facility]AL WELLNESS VISIT  11/19/2023 [facility] ONCOLOGY FOLLOW UP 30 Chrystal, Glenn, MD  11/17/2023 [facility]        ==========APPENDIX - ON TREATMENT VISIT NOTES==========   See weekly On Treatment Notes in Epic for details in the Media tab (listed as Progress notes on the On Treatment Visit Dates listed above).

## 2023-10-31 ENCOUNTER — Other Ambulatory Visit: Payer: Self-pay

## 2023-10-31 DIAGNOSIS — F411 Generalized anxiety disorder: Secondary | ICD-10-CM

## 2023-10-31 DIAGNOSIS — Z0000 Encounter for general adult medical examination without abnormal findings: Secondary | ICD-10-CM

## 2023-10-31 DIAGNOSIS — E782 Mixed hyperlipidemia: Secondary | ICD-10-CM

## 2023-11-17 ENCOUNTER — Other Ambulatory Visit: Payer: Medicare Other

## 2023-11-19 ENCOUNTER — Encounter: Payer: Self-pay | Admitting: Radiation Oncology

## 2023-11-19 ENCOUNTER — Ambulatory Visit
Admission: RE | Admit: 2023-11-19 | Discharge: 2023-11-19 | Disposition: A | Discharge status: Home / Self Care | Payer: Medicare Other | Source: Ambulatory Visit | Attending: Radiation Oncology | Admitting: Radiation Oncology

## 2023-11-19 VITALS — BP 123/77 | HR 92 | Temp 98.0°F | Wt 163.0 lb

## 2023-11-19 DIAGNOSIS — Z170 Estrogen receptor positive status [ER+]: Secondary | ICD-10-CM | POA: Diagnosis present

## 2023-11-19 DIAGNOSIS — C50912 Malignant neoplasm of unspecified site of left female breast: Secondary | ICD-10-CM | POA: Insufficient documentation

## 2023-11-19 NOTE — Progress Notes (Signed)
 Radiation Oncology Follow up Note  Name: Stephanie Hess   Date:   11/19/2023 MRN:  [id number] DOB: 13-Sep-1951    This 73 y.o. female presents to the clinic today for 1 month follow-up status post whole breast radiation to her left breast for stage Ib (pT1b N0 M0) invasive mammary carcinoma with mixed lobular and ductal features ER/PR positive.  REFERRING PROVIDER: Wallace Joesph LABOR, PA  HPI: Patient is a 73 year old female now out 1 month having completed whole breast radiation to her left breast for stage Ib ER/PR positive invasive mammary carcinoma.  Seen today in routine follow-up she is doing well.  She specifically denies breast tenderness cough or bone pain.  Is having some feelings of stretching tightness in her left upper extremity which I have assured her is related to her axillary surgery.  She is currently on.  Letrozole  tolerating it well without side effects.  COMPLICATIONS OF TREATMENT: none  FOLLOW UP COMPLIANCE: keeps appointments   PHYSICAL EXAM:  BP 123/77   Pulse 92   Temp 98 F (36.7 C) (Tympanic)   Wt 163 lb (73.9 kg)   BMI 29.81 kg/m  Lungs are clear to A&P cardiac examination essentially unremarkable with regular rate and rhythm. No dominant mass or nodularity is noted in either breast in 2 positions examined. Incision is well-healed. No axillary or supraclavicular adenopathy is appreciated. Cosmetic result is excellent.  Slight shrinkage of the left breast is noted.  Cosmetic result is still excellent.  Well-developed well-nourished patient in NAD. HEENT reveals PERLA, EOMI, discs not visualized.  Oral cavity is clear. No oral mucosal lesions are identified. Neck is clear without evidence of cervical or supraclavicular adenopathy. Lungs are clear to A&P. Cardiac examination is essentially unremarkable with regular rate and rhythm without murmur rub or thrill. Abdomen is benign with no organomegaly or masses noted. Motor sensory and DTR levels are equal and symmetric  in the upper and lower extremities. Cranial nerves II through XII are grossly intact. Proprioception is intact. No peripheral adenopathy or edema is identified. No motor or sensory levels are noted. Crude visual fields are within normal range.  RADIOLOGY RESULTS: No current films for review  PLAN: Present time patient is doing well 1 month out from whole breast radiation and pleased with her overall progress.  I have asked to see her back in 6 months for follow-up.  Patient continues letrozole  without side effect.  Patient and daughter know to call with any concerns.  I would like to take this opportunity to thank you for allowing me to participate in the care of your patient.SABRA Marcey Penton, MD

## 2023-11-20 ENCOUNTER — Ambulatory Visit: Payer: Medicare Other

## 2023-11-20 DIAGNOSIS — Z0000 Encounter for general adult medical examination without abnormal findings: Secondary | ICD-10-CM | POA: Diagnosis not present

## 2023-11-20 NOTE — Patient Instructions (Signed)
 Stephanie Hess , Thank you for taking time to come for your Medicare Wellness Visit. I appreciate your ongoing commitment to your health goals. Please review the following plan we discussed and let me know if I can assist you in the future.   Referrals/Orders/Follow-Ups/Clinician Recommendations: none  This is a list of the screening recommended for you and due dates:  Health Maintenance  Topic Date Due   Zoster (Shingles) Vaccine (1 of 2) Never done   COVID-19 Vaccine (3 - Moderna risk series) 04/16/2021   Screening for Lung Cancer  01/09/2022   Flu Shot  02/09/2024*   Pneumonia Vaccine (1 of 2 - PCV) 11/19/2024*   DTaP/Tdap/Td vaccine (2 - Td or Tdap) 09/21/2024   Medicare Annual Wellness Visit  11/19/2024   Mammogram  07/08/2025   Colon Cancer Screening  09/20/2029   DEXA scan (bone density measurement)  Completed   Hepatitis C Screening  Completed   HPV Vaccine  Aged Out  *Topic was postponed. The date shown is not the original due date.    Advanced directives: (ACP Link)Information on Advanced Care Planning can be found at [place]  Secretary of [facility] Advance Health Care Directives Advance Health Care Directives ([email or web address]   Next Medicare Annual Wellness Visit scheduled for next year: No, patient states she may be looking for a provider closer to her home  Insert Preventive Care attachment Insert FALL PREVENTION attachment if needed

## 2023-11-20 NOTE — Progress Notes (Signed)
 Subjective:   Stephanie Hess is a 73 y.o. female who presents for Medicare Annual (Subsequent) preventive examination.  Visit Complete: Virtual I connected with  Stephanie Hess on 11/20/23 by a audio enabled telemedicine application and verified that I am speaking with the correct person using two identifiers.  Interactive audio and video telecommunications were attempted between this provider and patient, however failed, due to patient having technical difficulties OR patient did not have access to video capability.  We continued and completed visit with audio only.  Patient Location: Home  Provider Location: Office/Clinic  I discussed the limitations of evaluation and management by telemedicine. The patient expressed understanding and agreed to proceed.  Vital Signs: Because this visit was a virtual/telehealth visit, some criteria may be missing or patient reported. Any vitals not documented were not able to be obtained and vitals that have been documented are patient reported.  Patient Medicare AWV questionnaire was completed by the patient on 11/18/2023; I have confirmed that all information answered by patient is correct and no changes since this date.  Cardiac Risk Factors include: advanced age (>50men, >9 women);dyslipidemia     Objective:    Today's Vitals   There is no height or weight on file to calculate BMI.     11/20/2023   11:32 AM 11/19/2023   11:07 AM 08/27/2023   10:33 AM 08/06/2023    8:40 AM 07/29/2023    4:57 PM 07/07/2023    2:42 PM 07/12/2021   12:02 PM  Advanced Directives  Does Patient Have a Medical Advance Directive? No No Yes No No No No  Does patient want to make changes to medical advance directive?   Yes (ED - Information included in AVS)      Would patient like information on creating a medical advance directive?  No - Patient declined Yes (ED - Information included in AVS) No - Patient declined No - Patient declined No - Patient declined No - Patient  declined    Current Medications (verified) Outpatient Encounter Medications as of 11/20/2023  Medication Sig   acetaminophen  (TYLENOL ) 500 MG tablet Take 1,000 mg by mouth every 6 (six) hours as needed.   azithromycin  (ZITHROMAX ) 250 MG tablet Take 250 mg by mouth daily.   budesonide  (PULMICORT ) 0.5 MG/2ML nebulizer solution Take 2 mLs by nebulization 2 (two) times daily.   busPIRone  (BUSPAR ) 10 MG tablet TAKE 1/2 TO 1 TABLET UP TO THREE TIMES DAILY AS NEEDED FOR ANXIETY   formoterol  (PERFOROMIST ) 20 MCG/2ML nebulizer solution INHALE 2 ML (20 MCG TOTAL) BY NEBULIZATION TWO (2) TIMES A DAY.   ipratropium (ATROVENT ) 0.02 % nebulizer solution Take 2.5 mLs (0.5 mg total) by nebulization 4 (four) times daily.   letrozole  (FEMARA ) 2.5 MG tablet Take 1 tablet (2.5 mg total) by mouth daily. Start after radiation is complete   levalbuterol  (XOPENEX  HFA) 45 MCG/ACT inhaler INHALE 1 PUFF INTO THE LUNGS EVERY 6 HOURS AS NEEDED FOR WHEEZING.   LORazepam  (ATIVAN ) 0.5 MG tablet Take 0.5-1 tablets (0.25-0.5 mg total) by mouth as needed for anxiety (for breakthrough anxiety).   omeprazole  (PRILOSEC) 20 MG capsule Take 20 mg by mouth daily.   OXYGEN  Inhale into the lungs. 2 litre at night   predniSONE  (DELTASONE ) 10 MG tablet Take 10 mg by mouth daily.   silver  sulfADIAZINE  (SILVADENE ) 1 % cream Apply 1 Application topically daily.   traZODone  (DESYREL ) 50 MG tablet Take 0.5-1 tablets (25-50 mg total) by mouth at bedtime as needed. for  sleep   No facility-administered encounter medications on file as of 11/20/2023.    Allergies (verified) Penicillins, Shellfish allergy, Sulfa antibiotics, Clarithromycin, Amoxicillin-pot clavulanate, Iodinated contrast media, Atorvastatin , and Macrobid [nitrofurantoin]   History: Past Medical History:  Diagnosis Date   Cancer (HCC)    COPD (chronic obstructive pulmonary disease) (HCC)    Diverticula of colon    GERD (gastroesophageal reflux disease)    Hyperlipidemia     Renal insufficiency    2009 or 2010   Past Surgical History:  Procedure Laterality Date   APPENDECTOMY     AXILLARY SENTINEL NODE BIOPSY Left 08/06/2023   Procedure: AXILLARY SENTINEL NODE BIOPSY;  Surgeon: Lane Shope, MD;  Location: [facility];  Service: General;  Laterality: Left;   BREAST BIOPSY Left 07/02/2023   US  LT BREAST BX W LOC DEV 1ST LESION IMG BX SPEC US  GUIDE 07/02/2023 [facility]   BREAST LUMPECTOMY WITH RADIOFREQUENCY TAG IDENTIFICATION Left 08/06/2023   Procedure: BREAST LUMPECTOMY WITH RADIOFREQUENCY TAG IDENTIFICATION;  Surgeon: Lane Shope, MD;  Location: [facility];  Service: General;  Laterality: Left;   CESAREAN SECTION     ECTOPIC PREGNANCY SURGERY     Family History  Problem Relation Age of Onset   Breast cancer Paternal Aunt    Heart attack Mother    Social History   Socioeconomic History   Marital status: Divorced    Spouse name: Not on file   Number of children: Not on file   Years of education: Not on file   Highest education level: Not on file  Occupational History   Not on file  Tobacco Use   Smoking status: Every Day    Current packs/day: 0.50    Average packs/day: 0.5 packs/day for 50.0 years (25.0 ttl pk-yrs)    Types: Cigarettes    Passive exposure: Past   Smokeless tobacco: Never  Vaping Use   Vaping status: Never Used  Substance and Sexual Activity   Alcohol use: Yes    Comment: social   Drug use: No   Sexual activity: Not Currently  Other Topics Concern   Not on file  Social History Narrative   Not on file   Social Drivers of Health   Financial Resource Strain: Low Risk  (11/20/2023)   Overall Financial Resource Strain (CARDIA)    Difficulty of Paying Living Expenses: Not hard at all  Food Insecurity: No Food Insecurity (11/20/2023)   Hunger Vital Sign    Worried About Radiation Protection Practitioner of Food in the Last Year: Never true    Ran Out of Food in the Last Year: Never true  Transportation Needs: No Transportation Needs  (11/20/2023)   PRAPARE - Administrator, Civil Service (Medical): No    Lack of Transportation (Non-Medical): No  Physical Activity: Inactive (11/20/2023)   Exercise Vital Sign    Days of Exercise per Week: 0 days    Minutes of Exercise per Session: 0 min  Stress: No Stress Concern Present (11/20/2023)   [employer] of Occupational Health - Occupational Stress Questionnaire    Feeling of Stress : Only a little  Social Connections: Socially Isolated (11/20/2023)   Social Connection and Isolation Panel [NHANES]    Frequency of Communication with Friends and Family: More than three times a week    Frequency of Social Gatherings with Friends and Family: Not on file    Attends Religious Services: Never    Database Administrator or Organizations: No    Attends Banker  Meetings: Never    Marital Status: Divorced    Tobacco Counseling Ready to quit: Yes Counseling given: Not Answered   Clinical Intake:  Pre-visit preparation completed: Yes  Pain : No/denies pain     Nutritional Risks: None Diabetes: No  How often do you need to have someone help you when you read instructions, pamphlets, or other written materials from your doctor or pharmacy?: 1 - Never  Interpreter Needed?: No  Information entered by :: NAllen LPN   Activities of Daily Living    11/18/2023    5:52 PM 07/29/2023    4:58 PM  In your present state of health, do you have any difficulty performing the following activities:  Hearing? 0   Vision? 0   Difficulty concentrating or making decisions? 0   Walking or climbing stairs? 0   Dressing or bathing? 0   Doing errands, shopping? 0 0  Preparing Food and eating ? N   Using the Toilet? N   In the past six months, have you accidently leaked urine? N   Do you have problems with loss of bowel control? N   Managing your Medications? N   Managing your Finances? N   Housekeeping or managing your Housekeeping? N     Patient Care  Team: Wallace Joesph LABOR, PA as PCP - General (Family Medicine) Georgina Shasta POUR, RN as Oncology Nurse Navigator Melanee Annah BROCKS, MD as Consulting Physician (Oncology)  Indicate any recent Medical Services you may have received from other than [facility] providers in the past year (date may be approximate).     Assessment:   This is a routine wellness examination for Stephanie Hess.  Hearing/Vision screen Hearing Screening - Comments:: Denies hearing issues Vision Screening - Comments:: Regular eye exams, [facility]   Goals Addressed             This Visit's Progress    Patient Stated       11/20/2023, denies goals       Depression Screen    11/20/2023   11:34 AM 05/23/2023   10:48 AM 11/18/2022    9:27 AM 07/08/2022   11:25 AM 01/14/2022    4:02 PM 11/07/2021   10:35 AM 08/31/2021   10:06 AM  PHQ 2/9 Scores  PHQ - 2 Score 0 0 0 0 1 0 0  PHQ- 9 Score  0 2 3 4  0 0    Fall Risk    11/18/2023    5:52 PM 05/23/2023   10:48 AM 11/18/2022    9:28 AM 11/07/2021   10:36 AM 08/31/2021   10:07 AM  Fall Risk   Falls in the past year? 0 0 0 0 0  Number falls in past yr: 0 0 0 0 0  Injury with Fall? 0 0 0 0 0  Risk for fall due to : Medication side effect No Fall Risks     Follow up Falls prevention discussed;Falls evaluation completed Falls evaluation completed  Falls evaluation completed Falls evaluation completed    MEDICARE RISK AT HOME: Medicare Risk at Home Any stairs in or around the home?: (Patient-Rptd) Yes If so, are there any without handrails?: (Patient-Rptd) No Home free of loose throw rugs in walkways, pet beds, electrical cords, etc?: (Patient-Rptd) No Adequate lighting in your home to reduce risk of falls?: (Patient-Rptd) Yes Life alert?: (Patient-Rptd) No Use of a cane, walker or w/c?: (Patient-Rptd) No Grab bars in the bathroom?: (Patient-Rptd) No Shower chair or bench in shower?: (Patient-Rptd) No  Elevated toilet seat or a handicapped toilet?: (Patient-Rptd) No  TIMED UP  AND GO:  Was the test performed?  No    Cognitive Function:        11/20/2023   11:34 AM 11/18/2022    9:14 AM 11/07/2021   10:42 AM  6CIT Screen  What Year? 0 points 0 points 0 points  What month? 0 points 0 points 0 points  What time? 0 points 0 points 0 points  Count back from 20 0 points 0 points 0 points  Months in reverse 0 points 0 points 0 points  Repeat phrase 0 points 0 points 2 points  Total Score 0 points 0 points 2 points    Immunizations Immunization History  Administered Date(s) Administered   Influenza Split 09/21/2014   Influenza, High Dose Seasonal PF 08/18/2018   Moderna Sars-Covid-2 Vaccination 10/18/2020, 03/19/2021   Tdap 09/21/2014    TDAP status: Up to date  Flu Vaccine status: Declined, Education has been provided regarding the importance of this vaccine but patient still declined. Advised may receive this vaccine at local pharmacy or Health Dept. Aware to provide a copy of the vaccination record if obtained from local pharmacy or Health Dept. Verbalized acceptance and understanding.  Pneumococcal vaccine status: Declined,  Education has been provided regarding the importance of this vaccine but patient still declined. Advised may receive this vaccine at local pharmacy or Health Dept. Aware to provide a copy of the vaccination record if obtained from local pharmacy or Health Dept. Verbalized acceptance and understanding.   Covid-19 vaccine status: Information provided on how to obtain vaccines.   Qualifies for Shingles Vaccine? Yes   Zostavax completed No   Shingrix Completed?: No.    Education has been provided regarding the importance of this vaccine. Patient has been advised to call insurance company to determine out of pocket expense if they have not yet received this vaccine. Advised may also receive vaccine at local pharmacy or Health Dept. Verbalized acceptance and understanding.  Screening Tests Health Maintenance  Topic Date Due   Zoster  Vaccines- Shingrix (1 of 2) Never done   COVID-19 Vaccine (3 - Moderna risk series) 04/16/2021   Lung Cancer Screening  01/09/2022   INFLUENZA VACCINE  02/09/2024 (Originally 06/12/2023)   Pneumonia Vaccine 51+ Years old (1 of 2 - PCV) 11/19/2024 (Originally 09/25/1957)   DTaP/Tdap/Td (2 - Td or Tdap) 09/21/2024   Medicare Annual Wellness (AWV)  11/19/2024   MAMMOGRAM  07/08/2025   Colonoscopy  09/20/2029   DEXA SCAN  Completed   Hepatitis C Screening  Completed   HPV VACCINES  Aged Out    Health Maintenance  Health Maintenance Due  Topic Date Due   Zoster Vaccines- Shingrix (1 of 2) Never done   COVID-19 Vaccine (3 - Moderna risk series) 04/16/2021   Lung Cancer Screening  01/09/2022    Colorectal cancer screening: Type of screening: Colonoscopy. Completed 09/21/2019. Repeat every 10 years  Mammogram status: Completed 07/09/2023. Repeat every year  Bone Density status: Completed 09/26/2023.   Lung Cancer Screening: (Low Dose CT Chest recommended if Age 67-80 years, 20 pack-year currently smoking OR have quit w/in 15years.) does not qualify.   Lung Cancer Screening Referral: no  Additional Screening:  Hepatitis C Screening: does qualify; Completed 08/14/2020  Vision Screening: Recommended annual ophthalmology exams for early detection of glaucoma and other disorders of the eye. Is the patient up to date with their annual eye exam?  Yes  Who is the provider  or what is the name of the office in which the patient attends annual eye exams? [facility] If pt is not established with a provider, would they like to be referred to a provider to establish care? No .   Dental Screening: Recommended annual dental exams for proper oral hygiene  Diabetic Foot Exam: n/a  Community Resource Referral / Chronic Care Management: CRR required this visit?  No   CCM required this visit?  No     Plan:     I have personally reviewed and noted the following in the patient's chart:    Medical and social history Use of alcohol, tobacco or illicit drugs  Current medications and supplements including opioid prescriptions. Patient is not currently taking opioid prescriptions. Functional ability and status Nutritional status Physical activity Advanced directives List of other physicians Hospitalizations, surgeries, and ER visits in previous 12 months Vitals Screenings to include cognitive, depression, and falls Referrals and appointments  In addition, I have reviewed and discussed with patient certain preventive protocols, quality metrics, and best practice recommendations. A written personalized care plan for preventive services as well as general preventive health recommendations were provided to patient.     Ardella FORBES Dawn, LPN   8/0/7974   After Visit Summary: (MyChart) Due to this being a telephonic visit, the after visit summary with patients personalized plan was offered to patient via MyChart   Nurse Notes: none

## 2023-11-24 ENCOUNTER — Encounter: Payer: Medicare Other | Admitting: Family Medicine

## 2023-12-02 ENCOUNTER — Other Ambulatory Visit: Payer: Self-pay | Admitting: Family Medicine

## 2023-12-02 DIAGNOSIS — F411 Generalized anxiety disorder: Secondary | ICD-10-CM

## 2023-12-15 ENCOUNTER — Other Ambulatory Visit: Payer: Self-pay

## 2023-12-15 ENCOUNTER — Inpatient Hospital Stay: Payer: Medicare Other | Attending: Oncology | Admitting: *Deleted

## 2023-12-15 DIAGNOSIS — C50912 Malignant neoplasm of unspecified site of left female breast: Secondary | ICD-10-CM

## 2023-12-15 DIAGNOSIS — Z170 Estrogen receptor positive status [ER+]: Secondary | ICD-10-CM | POA: Insufficient documentation

## 2023-12-15 DIAGNOSIS — Z79811 Long term (current) use of aromatase inhibitors: Secondary | ICD-10-CM | POA: Insufficient documentation

## 2023-12-15 DIAGNOSIS — J449 Chronic obstructive pulmonary disease, unspecified: Secondary | ICD-10-CM | POA: Insufficient documentation

## 2023-12-15 DIAGNOSIS — Z7952 Long term (current) use of systemic steroids: Secondary | ICD-10-CM | POA: Insufficient documentation

## 2023-12-15 DIAGNOSIS — E785 Hyperlipidemia, unspecified: Secondary | ICD-10-CM | POA: Insufficient documentation

## 2023-12-15 DIAGNOSIS — Z79899 Other long term (current) drug therapy: Secondary | ICD-10-CM | POA: Insufficient documentation

## 2023-12-15 DIAGNOSIS — K219 Gastro-esophageal reflux disease without esophagitis: Secondary | ICD-10-CM | POA: Insufficient documentation

## 2023-12-15 DIAGNOSIS — J440 Chronic obstructive pulmonary disease with acute lower respiratory infection: Secondary | ICD-10-CM | POA: Insufficient documentation

## 2023-12-15 DIAGNOSIS — Z7951 Long term (current) use of inhaled steroids: Secondary | ICD-10-CM | POA: Insufficient documentation

## 2023-12-15 DIAGNOSIS — M25512 Pain in left shoulder: Secondary | ICD-10-CM | POA: Insufficient documentation

## 2023-12-15 DIAGNOSIS — Z9189 Other specified personal risk factors, not elsewhere classified: Secondary | ICD-10-CM

## 2023-12-15 NOTE — Progress Notes (Signed)
SUBJECTIVE: Pt returns for her 3 month L-Dex screen.    PAIN:  Are you having pain? YES, in left arm/shoulder. Will have her see Marisue Humble for evaluation.   She is scheduled for 2/12 at 8:30   SOZO SCREENING: Patient was assessed today using the SOZO machine to determine the lymphedema index score. This was compared to her baseline score. It was determined that she is within the recommended range when compared to her baseline and no further action is needed at this time. She will continue SOZO screenings. These are done every 3 months for 2 years post operatively followed by every 6 months for 2 years, and then annually.     L-DEX FLOWSHEETS                L-DEX LYMPHEDEMA SCREENING    Measurement Type Unilateral     L-DEX MEASUREMENT EXTREMITY Upper Extremity     POSITION  Standing     DOMINANT SIDE Right     At Risk Side Left     BASELINE SCORE (UNILATERAL) -1.1    L-DEX SCORE (UNILATERAL) 2.0    VALUE CHANGE (UNILAT) 3.1

## 2023-12-18 ENCOUNTER — Ambulatory Visit (INDEPENDENT_AMBULATORY_CARE_PROVIDER_SITE_OTHER): Payer: Medicare Other | Admitting: Family Medicine

## 2023-12-18 ENCOUNTER — Encounter: Payer: Self-pay | Admitting: Family Medicine

## 2023-12-18 VITALS — BP 112/72 | HR 90 | Temp 97.7°F | Resp 18 | Ht 62.0 in | Wt 160.0 lb

## 2023-12-18 DIAGNOSIS — Z720 Tobacco use: Secondary | ICD-10-CM

## 2023-12-18 DIAGNOSIS — Z0000 Encounter for general adult medical examination without abnormal findings: Secondary | ICD-10-CM

## 2023-12-18 DIAGNOSIS — F411 Generalized anxiety disorder: Secondary | ICD-10-CM | POA: Diagnosis not present

## 2023-12-18 DIAGNOSIS — M85852 Other specified disorders of bone density and structure, left thigh: Secondary | ICD-10-CM

## 2023-12-18 DIAGNOSIS — Z7689 Persons encountering health services in other specified circumstances: Secondary | ICD-10-CM

## 2023-12-18 DIAGNOSIS — C50912 Malignant neoplasm of unspecified site of left female breast: Secondary | ICD-10-CM

## 2023-12-18 DIAGNOSIS — G4700 Insomnia, unspecified: Secondary | ICD-10-CM

## 2023-12-18 DIAGNOSIS — I700 Atherosclerosis of aorta: Secondary | ICD-10-CM

## 2023-12-18 DIAGNOSIS — E559 Vitamin D deficiency, unspecified: Secondary | ICD-10-CM

## 2023-12-18 DIAGNOSIS — E782 Mixed hyperlipidemia: Secondary | ICD-10-CM

## 2023-12-18 DIAGNOSIS — Z789 Other specified health status: Secondary | ICD-10-CM

## 2023-12-18 DIAGNOSIS — J449 Chronic obstructive pulmonary disease, unspecified: Secondary | ICD-10-CM | POA: Diagnosis not present

## 2023-12-18 DIAGNOSIS — K219 Gastro-esophageal reflux disease without esophagitis: Secondary | ICD-10-CM

## 2023-12-18 NOTE — Patient Instructions (Signed)
 MyChart:  For all urgent or time sensitive needs we ask that you please call the office to avoid delays. Our number is [phone]. MyChart is not constantly monitored and due to the large volume of messages a day, replies may take up to 72 business hours.   MyChart Policy: MyChart allows for you to see your visit notes, after visit summary, provider recommendations, lab and tests results, make an appointment, request refills, and contact your provider or the office for non-urgent questions or concerns. Providers are seeing patients during normal business hours and do not have built in time to review MyChart messages.  We ask that you allow a minimum of 3 business days for responses to [employer]. For this reason, please do not send urgent requests through MyChart. Please call the office at [phone]. New and ongoing conditions may require a visit. We have virtual and in person visit available for your convenience.  Complex MyChart concerns may require a visit. Your provider may request you schedule a virtual or in person visit to ensure we are providing the best care possible. MyChart messages sent after 11:00 AM on Friday will not be received by the provider until Monday morning.    Lab and Test Results: You will receive your lab and test results on MyChart as soon as they are completed and results have been sent by the lab or testing facility. Due to this service, you will receive your results BEFORE your provider.  I review lab and tests results each morning prior to seeing patients. Some results require collaboration with other providers to ensure you are receiving the most appropriate care. For this reason, we ask that you please allow a minimum of 3-5 business days from the time the ALL results have been received for your provider to receive and review lab and test results and contact you about these.  Most lab and test result comments from the provider will be sent through MyChart.  Your provider may recommend changes to the plan of care, follow-up visits, repeat testing, ask questions, or request an office visit to discuss these results. You may reply directly to this message or call the office at [phone] to provide information for the provider or set up an appointment. In some instances, you will be called with test results and recommendations. Please let us know if this is preferred and we will make note of this in your chart to provide this for you.    If you have not heard a response to your lab or test results in 5 business days from all results returning to MyChart, please call the office to let us know. We ask that you please avoid calling prior to this time unless there is an emergent concern. Due to high call volumes, this can delay the resulting process.   After Hours: For all non-emergency after hours needs, please call the office at [phone] and select the option to reach the on-call provider service. On-call services are shared between multiple [facility] offices and therefore it will not be possible to speak directly with your provider. On-call providers may provide medical advice and recommendations, but are unable to provide refills for maintenance medications.  For all emergency or urgent medical needs after normal business hours, we recommend that you seek care at the closest Urgent Care or Emergency Department to ensure appropriate treatment in a timely manner.  [facility] [place] has a 24 hour emergency room located on the ground floor for your  convenience.    Urgent Concerns During the Business Day Providers are seeing patients from 8AM to 5PM, Monday through Thursday, and 8AM to 12PM on Friday with a busy schedule and are most often not able to respond to non-urgent calls until the end of the day or the next business day. If you should have URGENT concerns during the day, please call and speak to the nurse or schedule a same day  appointment so that we can address your concern without delay.    Thank you, again, for choosing me as your health care partner. I appreciate your trust and look forward to learning more about you.    Alyson Reedy, FNP-C

## 2023-12-18 NOTE — Progress Notes (Signed)
 New Patient Office Visit  Subjective   Patient ID: Stephanie Hess, female    DOB: June 29, 1951  Age: 73 y.o. MRN: [id number]  CC:  Chief Complaint  Patient presents with   Establish Care   HPI Stephanie Hess is a 73 year old female who presents to establish with [name].   CC: Patient here to establish care  Last PCP: Joesph Sear, PA-C Specialists: pulmonology, cardiology, oncology   PMH: COPD, GERD, HLD,  invasive mammary carcinoma- whole breast radiation L breast for stage 1B, GAD, aortic atherosclerosis   HLD- statin gave leg cramps, stopped taking crestor    Insomnia- trazodone  at bedtime 1 tab (50mg ) & buspar  10mg  at bedtime   COPD- 2L at bedtime & chronic prednisone  + daily azithromycin  250mg    Anxiety- ativan  not even once a week   Thyroid  nodules- US  yearly   Breast cancer- finished radiation in Dec ~4 weeks (Letrozole ) Noticed hot flashes & yeast infection   The 10-year ASCVD risk score (Arnett DK, et al., 2019) is: 13.6%   Values used to calculate the score:     Age: 67 years     Sex: Female     Is Non-Hispanic African American: No     Diabetic: No     Tobacco smoker: Yes     Systolic Blood Pressure: 112 mmHg     Is BP treated: No     HDL Cholesterol: 54 mg/dL     Total Cholesterol: 247 mg/dL     05/13/7973   88:76 AM 05/23/2023   10:48 AM 11/18/2022    9:28 AM 07/08/2022   11:25 AM  GAD 7 : Generalized Anxiety Score  Nervous, Anxious, on Edge 1 0 0 0  Control/stop worrying 1 0 0 0  Worry too much - different things 1 0 0 0  Trouble relaxing 1 0 0 0  Restless 1 0 0 0  Easily annoyed or irritable 1 0 0 0  Afraid - awful might happen 1 0 0 0  Total GAD 7 Score 7 0 0 0  Anxiety Difficulty Not difficult at all Not difficult at all Not difficult at all        12/18/2023   11:23 AM 11/20/2023   11:34 AM 05/23/2023   10:48 AM  PHQ9 SCORE ONLY  PHQ-9 Total Score 5 0 0     Outpatient Encounter Medications as of 12/18/2023   Medication Sig   acetaminophen  (TYLENOL ) 500 MG tablet Take 1,000 mg by mouth every 6 (six) hours as needed.   azithromycin  (ZITHROMAX ) 250 MG tablet Take 250 mg by mouth daily.   budesonide  (PULMICORT ) 0.5 MG/2ML nebulizer solution Take 2 mLs by nebulization 2 (two) times daily.   busPIRone  (BUSPAR ) 10 MG tablet TAKE 1/2 TO 1 TABLET UP TO THREE TIMES DAILY AS NEEDED FOR ANXIETY   formoterol  (PERFOROMIST ) 20 MCG/2ML nebulizer solution INHALE 2 ML (20 MCG TOTAL) BY NEBULIZATION TWO (2) TIMES A DAY.   ipratropium (ATROVENT ) 0.02 % nebulizer solution Take 2.5 mLs (0.5 mg total) by nebulization 4 (four) times daily.   letrozole  (FEMARA ) 2.5 MG tablet Take 1 tablet (2.5 mg total) by mouth daily. Start after radiation is complete   levalbuterol  (XOPENEX  HFA) 45 MCG/ACT inhaler INHALE 1 PUFF INTO THE LUNGS EVERY 6 HOURS AS NEEDED FOR WHEEZING.   LORazepam  (ATIVAN ) 0.5 MG tablet TAKE 0.5-1 TABLETS (0.25-0.5 MG TOTAL) BY MOUTH AS NEEDED FOR ANXIETY (FOR BREAKTHROUGH ANXIETY).   omeprazole  (PRILOSEC) 20 MG  capsule Take 20 mg by mouth daily.   OXYGEN  Inhale into the lungs. 2 litre at night   predniSONE  (DELTASONE ) 10 MG tablet Take 10 mg by mouth daily.   silver  sulfADIAZINE  (SILVADENE ) 1 % cream Apply 1 Application topically daily.   traZODone  (DESYREL ) 50 MG tablet Take 0.5-1 tablets (25-50 mg total) by mouth at bedtime as needed. for sleep   [DISCONTINUED] rosuvastatin  (CRESTOR ) 5 MG tablet Take 5 mg by mouth daily.   [DISCONTINUED] azithromycin  (ZITHROMAX ) 250 MG tablet Take 250 mg by mouth daily. (Patient not taking: Reported on 12/18/2023)   No facility-administered encounter medications on file as of 12/18/2023.    Patient Active Problem List   Diagnosis Date Noted   Malignant neoplasm of upper-outer quadrant of left breast in female, 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/2023   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) 07/08/2023   Aortic atherosclerosis (HCC) 05/23/2023   BMI  30.0-30.9,adult 03/07/2022   Generalized anxiety disorder 08/19/2021   Chronic obstructive pulmonary disease (HCC) 07/13/2021   Nicotine  abuse 03/03/2018   Mild intermittent asthma without complication 03/03/2018   Pulmonary fibrosis (HCC) 03/03/2018   GERD (gastroesophageal reflux disease) 03/03/2018   Hyperlipidemia, mixed 05/15/2017   Vitamin D  deficiency 05/15/2017   Past Medical History:  Diagnosis Date   Cancer (HCC)    COPD (chronic obstructive pulmonary disease) (HCC)    Diverticula of colon    GERD (gastroesophageal reflux disease)    Hyperlipidemia    Renal insufficiency    2009 or 2010   Past Surgical History:  Procedure Laterality Date   APPENDECTOMY     AXILLARY SENTINEL NODE BIOPSY Left 08/06/2023   Procedure: AXILLARY SENTINEL NODE BIOPSY;  Surgeon: Lane Shope, MD;  Location: [facility] ORS;  Service: General;  Laterality: Left;   BREAST BIOPSY Left 07/02/2023   US  LT BREAST BX W LOC DEV 1ST LESION IMG BX SPEC US  GUIDE 07/02/2023 [facility]   BREAST LUMPECTOMY WITH RADIOFREQUENCY TAG IDENTIFICATION Left 08/06/2023   Procedure: BREAST LUMPECTOMY WITH RADIOFREQUENCY TAG IDENTIFICATION;  Surgeon: Lane Shope, MD;  Location: [facility] ORS;  Service: General;  Laterality: Left;   CESAREAN SECTION     ECTOPIC PREGNANCY SURGERY     Family History  Problem Relation Age of Onset   Breast cancer Paternal Aunt    Heart attack Mother    Social History   Socioeconomic History   Marital status: Divorced    Spouse name: Not on file   Number of children: Not on file   Years of education: Not on file   Highest education level: 12th grade  Occupational History   Not on file  Tobacco Use   Smoking status: Every Day    Current packs/day: 0.50    Average packs/day: 0.5 packs/day for 50.0 years (25.0 ttl pk-yrs)    Types: Cigarettes    Passive exposure: Past   Smokeless tobacco: Never  Vaping Use   Vaping status: Never Used  Substance and Sexual Activity   Alcohol  use: Yes    Comment: social   Drug use: No   Sexual activity: Not Currently  Other Topics Concern   Not on file  Social History Narrative   Not on file   Social Drivers of Health   Financial Resource Strain: Medium Risk (12/17/2023)   Overall Financial Resource Strain (CARDIA)    Difficulty of Paying Living Expenses: Somewhat hard  Food Insecurity: No Food Insecurity (12/17/2023)   Hunger Vital Sign    Worried About Running  Out of Food in the Last Year: Never true    Ran Out of Food in the Last Year: Never true  Transportation Needs: No Transportation Needs (12/17/2023)   PRAPARE - Administrator, Civil Service (Medical): No    Lack of Transportation (Non-Medical): No  Physical Activity: Inactive (12/17/2023)   Exercise Vital Sign    Days of Exercise per Week: 0 days    Minutes of Exercise per Session: 0 min  Stress: No Stress Concern Present (12/17/2023)   [employer] of Occupational Health - Occupational Stress Questionnaire    Feeling of Stress : Only a little  Social Connections: Socially Isolated (12/17/2023)   Social Connection and Isolation Panel [NHANES]    Frequency of Communication with Friends and Family: More than three times a week    Frequency of Social Gatherings with Friends and Family: Once a week    Attends Religious Services: Never    Database Administrator or Organizations: No    Attends Banker Meetings: Never    Marital Status: Divorced  Catering Manager Violence: Not At Risk (11/20/2023)   Humiliation, Afraid, Rape, and Kick questionnaire    Fear of Current or Ex-Partner: No    Emotionally Abused: No    Physically Abused: No    Sexually Abused: No   Outpatient Medications Prior to Visit  Medication Sig Dispense Refill   acetaminophen  (TYLENOL ) 500 MG tablet Take 1,000 mg by mouth every 6 (six) hours as needed.     azithromycin  (ZITHROMAX ) 250 MG tablet Take 250 mg by mouth daily.     budesonide  (PULMICORT ) 0.5 MG/2ML nebulizer  solution Take 2 mLs by nebulization 2 (two) times daily.     busPIRone  (BUSPAR ) 10 MG tablet TAKE 1/2 TO 1 TABLET UP TO THREE TIMES DAILY AS NEEDED FOR ANXIETY 270 tablet 2   formoterol  (PERFOROMIST ) 20 MCG/2ML nebulizer solution INHALE 2 ML (20 MCG TOTAL) BY NEBULIZATION TWO (2) TIMES A DAY.     ipratropium (ATROVENT ) 0.02 % nebulizer solution Take 2.5 mLs (0.5 mg total) by nebulization 4 (four) times daily. 75 mL 3   letrozole  (FEMARA ) 2.5 MG tablet Take 1 tablet (2.5 mg total) by mouth daily. Start after radiation is complete 30 tablet 6   levalbuterol  (XOPENEX  HFA) 45 MCG/ACT inhaler INHALE 1 PUFF INTO THE LUNGS EVERY 6 HOURS AS NEEDED FOR WHEEZING. 30 each 2   LORazepam  (ATIVAN ) 0.5 MG tablet TAKE 0.5-1 TABLETS (0.25-0.5 MG TOTAL) BY MOUTH AS NEEDED FOR ANXIETY (FOR BREAKTHROUGH ANXIETY). 60 tablet 0   omeprazole  (PRILOSEC) 20 MG capsule Take 20 mg by mouth daily.     OXYGEN  Inhale into the lungs. 2 litre at night     predniSONE  (DELTASONE ) 10 MG tablet Take 10 mg by mouth daily.     silver  sulfADIAZINE  (SILVADENE ) 1 % cream Apply 1 Application topically daily. 50 g 0   traZODone  (DESYREL ) 50 MG tablet Take 0.5-1 tablets (25-50 mg total) by mouth at bedtime as needed. for sleep 90 tablet 1   rosuvastatin  (CRESTOR ) 5 MG tablet Take 5 mg by mouth daily.     azithromycin  (ZITHROMAX ) 250 MG tablet Take 250 mg by mouth daily. (Patient not taking: Reported on 12/18/2023)     No facility-administered medications prior to visit.   Allergies  Allergen Reactions   Penicillins Nausea And Vomiting and Other (See Comments)    Has patient had a PCN reaction causing immediate rash, facial/tongue/throat swelling, SOB or lightheadedness with hypotension: No  Has patient had a PCN reaction causing severe rash involving mucus membranes or skin necrosis: No Has patient had a PCN reaction that required hospitalization: No Has patient had a PCN reaction occurring within the last 10 years: Yes If all of the above  answers are NO, then may proceed with Cephalosporin use.    Shellfish Allergy Nausea And Vomiting   Sulfa Antibiotics Rash and Other (See Comments)    Other reaction(s): Unknown   Clarithromycin Rash and Nausea Only    rash   Amoxicillin-Pot Clavulanate Nausea And Vomiting   Iodinated Contrast Media Other (See Comments)    acute renal insufficiency acute renal insufficiency   Atorvastatin  Rash   Macrobid [Nitrofurantoin] Rash   ROS: see HPI   Objective   Today's Vitals   12/18/23 1027 12/18/23 1116  BP: (!) 145/82 112/72  Pulse: 90   Resp: 18   Temp: 97.7 F (36.5 C)   TempSrc: Oral   SpO2: 95%   Weight: 160 lb (72.6 kg)   Height: 5' 2 (1.575 m)   PainSc: 2    PainLoc: Arm    Physical Exam Vitals reviewed.  Constitutional:      Appearance: Normal appearance.  Cardiovascular:     Rate and Rhythm: Normal rate and regular rhythm.     Pulses: Normal pulses.     Heart sounds: Normal heart sounds.  Pulmonary:     Effort: Pulmonary effort is normal. No respiratory distress.     Breath sounds: No stridor. Examination of the right-upper field reveals wheezing. Examination of the left-upper field reveals wheezing. Examination of the right-middle field reveals wheezing. Examination of the left-middle field reveals wheezing. Examination of the right-lower field reveals decreased breath sounds and wheezing. Examination of the left-lower field reveals decreased breath sounds and wheezing. Decreased breath sounds and wheezing present. No rhonchi or rales.  Musculoskeletal:     Right lower leg: No edema.     Left lower leg: No edema.  Neurological:     Mental Status: She is alert.  Psychiatric:        Mood and Affect: Mood normal.        Behavior: Behavior normal.      Assessment & Plan:   1. Encounter to establish care (Primary) Patient is a 67- year-old female who presents today to establish care with primary care at [facility]. Reviewed the past medical history, family  history, social history, surgical history, medications and allergies today- updates made as indicated. She does not have any acute concerns today.   2. Chronic obstructive pulmonary disease, unspecified COPD type (HCC) Followed by pulmonology. Lung sounds decreased tin bases posteriorly with expiratory wheezing present in upper and middle lobes. Patient is in no acute respiratory distress and has no use of accessory muscles. Reports she uses 2L supplemental oxygen  at night. Review of chart- last office visit with [facility] pulm 08/2023. Current inhalers include atrovent , budesonide  and formoterol . Also has nebulizer solution and feel this is more helpful at times. Patient continues to some 1/2 pack per day. Seen Q6 months with PFTs to ensure no progression and will need yearly CT scan for cancer screening). Discussed at this visit- will place order.  Would benefit from pneumonia immunization series- will discuss at next visit.  - Ambulatory Referral for Lung Cancer Screening [REF832]  3. 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 Followed by oncology- most recent  OV was on 11/19/2023 one-month f/u s/p radiation. Currently taking Letrozole  with symptoms of flushing/hot flashes. Follow-up appt with oncology in 6 months.   4. Aortic atherosclerosis (HCC) Review of chart- initially noted on CT scan in 2022 with left main and 2 vessel coronary artery disease. Due to current smoker, use of Letrozole , uncontrolled hyperlipidemia, updated EKG in office. EKG showed NSR with no acute abnormalities. Patient is high risk and referral is placed to cardiology. She has seen [facility] in the past and wishes to be seen by [facility].   - EKG 12-Lead - Ambulatory referral to Cardiology  5. Hyperlipidemia, mixed Lipid panel from 05/23/2023 with total chol 247, trig 269, LDL 144. Current ASCVD risk is 13.6%.  Patient reports she is no longer taking statin therapy due to muscle pain. Patient may benefit from Repatha. Will obtain fasting lipid panel next week.  - Ambulatory referral to Cardiology - Lipid panel; Future  6. Generalized anxiety disorder GAD7 completed with score of 7. Patient reports an increase in anxiety since being diagnosed with lobular carcinoma of her breast; however, she does feel like her current medication regimen is helpful for management of her anxiety. She reports taking lorazepam  PRN, only needing it about once a week. She reports taking buspirone  10mg  PRN at bedtime. No refills needed today.  - EKG 12-Lead  7. Vitamin D  deficiency Currently estrogen deficient, on chronic oral steroids, and taking Letrozole , increasing patient's risk for decreased bone mineral density. Will assess vitamin D .  - VITAMIN D  25 Hydroxy (Vit-D Deficiency, Fractures); Future  8. Nicotine  abuse Current smoker. Discussed smoking cessation.  See #2. - Ambulatory Referral for Lung Cancer Screening [REF832]  9. Gastroesophageal reflux disease, unspecified whether esophagitis present Patient currently taking omeprazole  daily.   10. Wellness examination Patient will return for fasting routine physical labs.  - CBC with Differential/Platelet; Future - Comprehensive metabolic panel; Future - Hemoglobin A1c; Future - Lipid panel; Future - TSH Rfx on Abnormal to Free T4; Future  11. Osteopenia of neck of left femur Review of chart- DEXA scan completed 09/26/2023 with T score of -1.5. Patient is not on any pharmacotherapy at this time. Will obtain vitamin D  level with fasting labs.  - VITAMIN D  25 Hydroxy (Vit-D Deficiency, Fractures); Future  12. Statin intolerance See #5  13. Insomnia, unspecified type Currently taking trazodone  50mg  daily at bedtime. No refills needed at this time.   Return in about 4 weeks (around 01/15/2024) for Physical with fasting labs.   [name], FNP

## 2023-12-20 LAB — COMPREHENSIVE METABOLIC PANEL
ALT: 21 [IU]/L (ref 0–32)
AST: 19 [IU]/L (ref 0–40)
Albumin: 4.1 g/dL (ref 3.8–4.8)
Alkaline Phosphatase: 69 [IU]/L (ref 44–121)
BUN/Creatinine Ratio: 12 (ref 12–28)
BUN: 10 mg/dL (ref 8–27)
Bilirubin Total: <0.2 mg/dL (ref 0.0–1.2)
CO2: 19 mmol/L — ABNORMAL LOW (ref 20–29)
Calcium: 10 mg/dL (ref 8.7–10.3)
Chloride: 108 mmol/L — ABNORMAL HIGH (ref 96–106)
Creatinine, Ser: 0.81 mg/dL (ref 0.57–1.00)
Globulin, Total: 2.7 g/dL (ref 1.5–4.5)
Glucose: 84 mg/dL (ref 70–99)
Potassium: 4.1 mmol/L (ref 3.5–5.2)
Sodium: 144 mmol/L (ref 134–144)
Total Protein: 6.8 g/dL (ref 6.0–8.5)
eGFR: 78 mL/min/{1.73_m2} (ref >59)

## 2023-12-20 LAB — CBC WITH DIFFERENTIAL/PLATELET
Basophils Absolute: 0.1 10*3/uL (ref 0.0–0.2)
Basos: 1 %
EOS (ABSOLUTE): 0.3 10*3/uL (ref 0.0–0.4)
Eos: 2 %
Hematocrit: 38.9 % (ref 34.0–46.6)
Hemoglobin: 12.7 g/dL (ref 11.1–15.9)
Immature Grans (Abs): 0.1 10*3/uL (ref 0.0–0.1)
Immature Granulocytes: 1 %
Lymphocytes Absolute: 3 10*3/uL (ref 0.7–3.1)
Lymphs: 21 %
MCH: 29.9 pg (ref 26.6–33.0)
MCHC: 32.6 g/dL (ref 31.5–35.7)
MCV: 92 fL (ref 79–97)
Monocytes Absolute: 1 10*3/uL — ABNORMAL HIGH (ref 0.1–0.9)
Monocytes: 7 %
Neutrophils Absolute: 9.7 10*3/uL — ABNORMAL HIGH (ref 1.4–7.0)
Neutrophils: 68 %
Platelets: 320 10*3/uL (ref 150–450)
RBC: 4.25 x10E6/uL (ref 3.77–5.28)
RDW: 13.7 % (ref 11.7–15.4)
WBC: 14 10*3/uL — ABNORMAL HIGH (ref 3.4–10.8)

## 2023-12-20 LAB — LIPID PANEL
Chol/HDL Ratio: 4.1 {ratio} (ref 0.0–4.4)
Cholesterol, Total: 203 mg/dL — ABNORMAL HIGH (ref 100–199)
HDL: 49 mg/dL (ref >39)
LDL Chol Calc (NIH): 116 mg/dL — ABNORMAL HIGH (ref 0–99)
Triglycerides: 221 mg/dL — ABNORMAL HIGH (ref 0–149)
VLDL Cholesterol Cal: 38 mg/dL (ref 5–40)

## 2023-12-20 LAB — VITAMIN D 25 HYDROXY (VIT D DEFICIENCY, FRACTURES): Vit D, 25-Hydroxy: 19.9 ng/mL — ABNORMAL LOW (ref 30.0–100.0)

## 2023-12-20 LAB — HEMOGLOBIN A1C
Est. average glucose Bld gHb Est-mCnc: 143 mg/dL
Hgb A1c MFr Bld: 6.6 % — ABNORMAL HIGH (ref 4.8–5.6)

## 2023-12-20 LAB — TSH RFX ON ABNORMAL TO FREE T4: TSH: 2.1 u[IU]/mL (ref 0.450–4.500)

## 2023-12-22 ENCOUNTER — Other Ambulatory Visit: Payer: Self-pay | Admitting: Family Medicine

## 2023-12-22 ENCOUNTER — Encounter: Payer: Self-pay | Admitting: Family Medicine

## 2023-12-22 DIAGNOSIS — J4520 Mild intermittent asthma, uncomplicated: Secondary | ICD-10-CM

## 2023-12-22 DIAGNOSIS — R062 Wheezing: Secondary | ICD-10-CM

## 2023-12-23 ENCOUNTER — Ambulatory Visit
Admission: RE | Admit: 2023-12-23 | Discharge: 2023-12-23 | Disposition: A | Discharge status: Home / Self Care | Payer: Medicare Other | Attending: Family Medicine | Admitting: Family Medicine

## 2023-12-23 ENCOUNTER — Ambulatory Visit
Admission: RE | Admit: 2023-12-23 | Discharge: 2023-12-23 | Disposition: A | Discharge status: Home / Self Care | Payer: Medicare Other | Source: Ambulatory Visit | Attending: Family Medicine | Admitting: Family Medicine

## 2023-12-23 DIAGNOSIS — J4520 Mild intermittent asthma, uncomplicated: Secondary | ICD-10-CM

## 2023-12-23 DIAGNOSIS — R062 Wheezing: Secondary | ICD-10-CM

## 2023-12-24 ENCOUNTER — Inpatient Hospital Stay: Payer: Medicare Other | Admitting: Occupational Therapy

## 2023-12-24 DIAGNOSIS — M25512 Pain in left shoulder: Secondary | ICD-10-CM

## 2023-12-24 NOTE — Therapy (Signed)
 [facility] - A Dept Of [place]. [facility] [address], [address] [place], [place], [address] Phone: [phone]   Fax:  [phone]  Occupational Therapy Screen  Patient Details  Name: Stephanie Hess MRN: [id number] Date of Birth: 24-Oct-1951 No data recorded  Encounter Date: 12/24/2023   OT End of Session - 12/24/23 1727     Visit Number 0             Past Medical History:  Diagnosis Date   Cancer [facility])    COPD (chronic obstructive pulmonary disease) (HCC)    Diverticula of colon    GERD (gastroesophageal reflux disease)    Hyperlipidemia    Renal insufficiency    2009 or 2010    Past Surgical History:  Procedure Laterality Date   APPENDECTOMY     AXILLARY SENTINEL NODE BIOPSY Left 08/06/2023   Procedure: AXILLARY SENTINEL NODE BIOPSY;  Surgeon: Campbell Lerner, MD;  Location: [facility] ORS;  Service: General;  Laterality: Left;   BREAST BIOPSY Left 07/02/2023   [place] LT BREAST BX W LOC DEV 1ST LESION IMG BX SPEC US GUIDE 07/02/2023 [facility]   BREAST LUMPECTOMY WITH RADIOFREQUENCY TAG IDENTIFICATION Left 08/06/2023   Procedure: BREAST LUMPECTOMY WITH RADIOFREQUENCY TAG IDENTIFICATION;  Surgeon: Campbell Lerner, MD;  Location: [facility] ORS;  Service: General;  Laterality: Left;   CESAREAN SECTION     ECTOPIC PREGNANCY SURGERY      There were no vitals filed for this visit.        OT SCREEN 12/24/23: Patient refer to OT for left shoulder pain. Patient was last time seen by this OT in November 24.  Patient  active range of motion was within normal limits as well as pain-free. Was reinforced with patient to continue her same home program for active assisted range of motion for flexion and abduction external rotation throughout radiation. Patient had left lumpectomy in September 24.  Radiation ended middle December. Patient present at last [facility] screen with breast navigator on February 25 with reports of increased  pain. Patient resting pain 5/10 in left shoulder and deltoid. Increased pain to 8/10 with shoulder flexion abduction external rotation. Appear patient stopped home exercises during radiation.  And with increased tightness and stiffness over the left breast and chest was only doing active range of motion causing some rotator cuff, impingement or tendinitis.   Patient would benefit from referral to physical therapy for evaluate and treat for left shoulder pain. Patient would prefer to go to [facility] outpatient physical therapy.                                    Visit Diagnosis: Acute pain of left shoulder    Problem List Patient Active Problem List   Diagnosis Date Noted   Statin intolerance 12/18/2023   Osteopenia of neck of left femur 12/18/2023   Malignant neoplasm of upper-outer quadrant of left breast in female, 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/2023   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) 07/08/2023   Aortic atherosclerosis (HCC) 05/23/2023   BMI 30.0-30.9,adult 03/07/2022   Generalized anxiety disorder 08/19/2021   Chronic obstructive pulmonary disease (HCC) 07/13/2021   Nicotine abuse 03/03/2018   Mild intermittent asthma without complication 03/03/2018   Pulmonary fibrosis (HCC) 03/03/2018   GERD (gastroesophageal reflux disease) 03/03/2018   Hyperlipidemia, mixed 05/15/2017   Vitamin D  deficiency 05/15/2017    Oletta Cohn, OT 12/24/2023, 5:29 PM  [facility] - A Dept Of [place]. [facility] [address] [place], [place], [address] Phone: [phone]   Fax:  [phone]  Name: Stephanie Hess MRN: [id number] Date of Birth: 1951/03/13

## 2023-12-25 ENCOUNTER — Other Ambulatory Visit: Payer: Self-pay

## 2023-12-25 DIAGNOSIS — M25512 Pain in left shoulder: Secondary | ICD-10-CM

## 2023-12-29 NOTE — Therapy (Incomplete)
 OUTPATIENT PHYSICAL THERAPY SHOULDER EVALUATION   Patient Name: Stephanie Hess MRN: [id number] DOB:1951-10-13, 73 y.o., female Today's Date: 12/29/2023  END OF SESSION:   Past Medical History:  Diagnosis Date   Cancer [facility])    COPD (chronic obstructive pulmonary disease) (HCC)    Diverticula of colon    GERD (gastroesophageal reflux disease)    Hyperlipidemia    Renal insufficiency    2009 or 2010   Past Surgical History:  Procedure Laterality Date   APPENDECTOMY     AXILLARY SENTINEL NODE BIOPSY Left 08/06/2023   Procedure: AXILLARY SENTINEL NODE BIOPSY;  Surgeon: Campbell Lerner, MD;  Location: [facility] ORS;  Service: General;  Laterality: Left;   BREAST BIOPSY Left 07/02/2023   [place] LT BREAST BX W LOC DEV 1ST LESION IMG BX SPEC US GUIDE 07/02/2023 [facility]   BREAST LUMPECTOMY WITH RADIOFREQUENCY TAG IDENTIFICATION Left 08/06/2023   Procedure: BREAST LUMPECTOMY WITH RADIOFREQUENCY TAG IDENTIFICATION;  Surgeon: Campbell Lerner, MD;  Location: [facility] ORS;  Service: General;  Laterality: Left;   CESAREAN SECTION     ECTOPIC PREGNANCY SURGERY     Patient Active Problem List   Diagnosis Date Noted   Statin intolerance 12/18/2023   Osteopenia of neck of left femur 12/18/2023   Malignant neoplasm of upper-outer quadrant of left breast in female, 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/2023   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) 07/08/2023   Aortic atherosclerosis (HCC) 05/23/2023   BMI 30.0-30.9,adult 03/07/2022   Generalized anxiety disorder 08/19/2021   Chronic obstructive pulmonary disease (HCC) 07/13/2021   Nicotine abuse 03/03/2018   Mild intermittent asthma without complication 03/03/2018   Pulmonary fibrosis (HCC) 03/03/2018   GERD (gastroesophageal reflux disease) 03/03/2018   Hyperlipidemia, mixed 05/15/2017   Vitamin D deficiency 05/15/2017    PCP: Alyson Reedy, FNP  REFERRING PROVIDER: Creig Hines, MD  REFERRING DIAG: [phone]  (ICD-10-CM) - Acute pain of left shoulder  THERAPY DIAG:  No diagnosis found.  Rationale for Evaluation and Treatment: Rehabilitation  ONSET DATE: ***  SUBJECTIVE:                                                                                                                                                                                      SUBJECTIVE STATEMENT: *** Hand dominance: {MISC; OT HAND DOMINANCE:[id number]}  PERTINENT HISTORY: Per OT screen on 12/24/23 at [facility], patient was last seen by OT 10/05/23 with AROM WNL and pain free and was told to continue home program for [facility] during radiation. Radiation ended middle of December and now comes with L shoulder pain with increasing pain during shoulder flexion, abduction, and external rotation. Patient underwent  L lumpectomy in 08/05/23, underwent radiation until mid December 2024.  PMH: breast cancer, COPD   PAIN:  Are you having pain? {OPRCPAIN:27236}  PRECAUTIONS: {Therapy precautions:24002}  RED FLAGS: {PT Red Flags:29287}   WEIGHT BEARING RESTRICTIONS: {Yes ***/No:24003}  FALLS:  Has patient fallen in last 6 months? {fallsyesno:27318}  LIVING ENVIRONMENT: Lives with: {[facility] lives with:25569::"lives with their family"} Lives in: {Lives in:25570} Stairs: {opstairs:27293} Has following equipment at home: {Assistive devices:23999}  OCCUPATION: ***  PLOF: {PLOF:24004}  PATIENT GOALS:***  NEXT MD VISIT:   OBJECTIVE:  Note: Objective measures were completed at Evaluation unless otherwise noted.  DIAGNOSTIC FINDINGS:  ***  PATIENT SURVEYS:  Quick Dash ***  COGNITION: Overall cognitive status: {cognition:24006}     SENSATION: {sensation:27233}  POSTURE: ***  UPPER EXTREMITY ROM:   {AROM/PROM:27142} ROM Right eval Left eval  Shoulder flexion    Shoulder extension    Shoulder abduction    Shoulder adduction    Shoulder internal rotation    Shoulder external rotation    Elbow flexion     Elbow extension    Wrist flexion    Wrist extension    Wrist ulnar deviation    Wrist radial deviation    Wrist pronation    Wrist supination    (Blank rows = not tested)  UPPER EXTREMITY MMT:  MMT Right eval Left eval  Shoulder flexion    Shoulder extension    Shoulder abduction    Shoulder adduction    Shoulder internal rotation    Shoulder external rotation    Middle trapezius    Lower trapezius    Elbow flexion    Elbow extension    Wrist flexion    Wrist extension    Wrist ulnar deviation    Wrist radial deviation    Wrist pronation    Wrist supination    Grip strength (lbs)    (Blank rows = not tested)  SHOULDER SPECIAL TESTS: Impingement tests: {shoulder impingement test:25231:a} SLAP lesions: {SLAP lesions:25232} Instability tests: {shoulder instability test:25233} Rotator cuff assessment: {rotator cuff assessment:25234} Biceps assessment: {biceps assessment:25235}  JOINT MOBILITY TESTING:  ***  PALPATION:  ***                                                                                                                             TREATMENT DATE: 12/29/23   PATIENT EDUCATION: Education details: *** Person educated: {Person educated:25204} Education method: {Education Method:25205} Education comprehension: {Education Comprehension:25206}  HOME EXERCISE PROGRAM: ***  ASSESSMENT:  CLINICAL IMPRESSION: Patient is a 73 y.o. female who was seen today for physical therapy evaluation and treatment for ***.   OBJECTIVE IMPAIRMENTS: {opptimpairments:25111}.   ACTIVITY LIMITATIONS: {activitylimitations:27494}  PARTICIPATION LIMITATIONS: {participationrestrictions:25113}  PERSONAL FACTORS: {Personal factors:25162} are also affecting patient's functional outcome.   REHAB POTENTIAL: {rehabpotential:25112}  CLINICAL DECISION MAKING: {clinical decision making:25114}  EVALUATION COMPLEXITY: {Evaluation complexity:25115}   GOALS: Goals  reviewed with patient? {yes/no:20286}  SHORT TERM GOALS: Target date: ***  ***  Baseline: Goal status: INITIAL  2.  *** Baseline:  Goal status: INITIAL  3.  *** Baseline:  Goal status: INITIAL  4.  *** Baseline:  Goal status: INITIAL  5.  *** Baseline:  Goal status: INITIAL  6.  *** Baseline:  Goal status: INITIAL  LONG TERM GOALS: Target date: ***  *** Baseline:  Goal status: INITIAL  2.  *** Baseline:  Goal status: INITIAL  3.  *** Baseline:  Goal status: INITIAL  4.  *** Baseline:  Goal status: INITIAL  5.  *** Baseline:  Goal status: INITIAL  6.  *** Baseline:  Goal status: INITIAL  PLAN:  PT FREQUENCY: 1-2x/week  PT DURATION: 8 weeks  PLANNED INTERVENTIONS: {rehab planned interventions:25118::"97110-Therapeutic exercises","97530- Therapeutic [phone]- Neuromuscular re-education","97535- Self NWGN","56213- Manual therapy"}  PLAN FOR NEXT SESSION: ***   Maylon Peppers, PT, DPT Physical Therapist - [place]  [facility]  12/29/2023, 4:45 PM

## 2023-12-30 ENCOUNTER — Ambulatory Visit: Payer: Medicare Other | Attending: Oncology

## 2023-12-30 DIAGNOSIS — M25612 Stiffness of left shoulder, not elsewhere classified: Secondary | ICD-10-CM | POA: Diagnosis present

## 2023-12-30 DIAGNOSIS — R293 Abnormal posture: Secondary | ICD-10-CM | POA: Diagnosis present

## 2023-12-30 DIAGNOSIS — M25512 Pain in left shoulder: Secondary | ICD-10-CM | POA: Diagnosis present

## 2023-12-30 NOTE — Therapy (Signed)
 OUTPATIENT PHYSICAL THERAPY EVALUATION   Patient Name: Stephanie Hess MRN: [id number] DOB:09-23-1951, 73 y.o., female Today's Date: 12/30/2023  END OF SESSION:  PT End of Session - 12/30/23 1136     Visit Number 1    Number of Visits 24    Date for PT Re-Evaluation 03/28/24    Authorization Type [employer] Medicare    Authorization Time Period 12/30/23-03/28/24    Progress Note Due on Visit 10    PT Start Time 1115    PT Stop Time 1145    PT Time Calculation (min) 30 min    Activity Tolerance Patient tolerated treatment well;Patient limited by pain    Behavior During Therapy [facility] for tasks assessed/performed             Past Medical History:  Diagnosis Date   Cancer (HCC)    COPD (chronic obstructive pulmonary disease) (HCC)    Diverticula of colon    GERD (gastroesophageal reflux disease)    Hyperlipidemia    Renal insufficiency    2009 or 2010   Past Surgical History:  Procedure Laterality Date   APPENDECTOMY     AXILLARY SENTINEL NODE BIOPSY Left 08/06/2023   Procedure: AXILLARY SENTINEL NODE BIOPSY;  Surgeon: Campbell Lerner, MD;  Location: [facility];  Service: General;  Laterality: Left;   BREAST BIOPSY Left 07/02/2023   [place] LT BREAST BX W LOC DEV 1ST LESION IMG BX SPEC US GUIDE 07/02/2023 [facility]   BREAST LUMPECTOMY WITH RADIOFREQUENCY TAG IDENTIFICATION Left 08/06/2023   Procedure: BREAST LUMPECTOMY WITH RADIOFREQUENCY TAG IDENTIFICATION;  Surgeon: Campbell Lerner, MD;  Location: [facility] OR[facility];  Service: General;  Laterality: Left;   CESAREAN SECTION     ECTOPIC PREGNANCY SURGERY     Patient Active Problem List   Diagnosis Date Noted   Statin intolerance 12/18/2023   Osteopenia of neck of left femur 12/18/2023   Malignant neoplasm of upper-outer quadrant of left breast in female, 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/2023   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) 07/08/2023   Aortic atherosclerosis (HCC) 05/23/2023   BMI 30.0-30.9,adult  03/07/2022   Generalized anxiety disorder 08/19/2021   Chronic obstructive pulmonary disease (HCC) 07/13/2021   Nicotine abuse 03/03/2018   Mild intermittent asthma without complication 03/03/2018   Pulmonary fibrosis (HCC) 03/03/2018   GERD (gastroesophageal reflux disease) 03/03/2018   Hyperlipidemia, mixed 05/15/2017   Vitamin D deficiency 05/15/2017    PCP: Alyson Reedy, FNP  REFERRING PROVIDER: Owens Shark, MD (oncologist)   REFERRING DIAG: Left shoulder pain   THERAPY DIAG:  Acute pain of left shoulder  Rationale for Evaluation and Treatment: Rehabilitation   ONSET DATE: January 2025  SUBJECTIVE:  SUBJECTIVE STATEMENT: Pt referred here by oncology for post radiation shoulder pain. Screened by OT at  cancer center who believes PT would be of benefit.   PERTINENT HISTORY: Stephanie Hess is a 72yo right handed female who reports having pain in her left shoulder, a lot of tightness, and paresthesias in left lateral upper arm. There is also some tightness in left upper trap to a lesser degree of pain. This started in January. Pt reports having Left axillary lymph node resection September and completed left radiation in December. MD reports her symptoms are attributed to nerve regeneration post radiation. OT reports her left shoulder muscles are angry.   Pt reports able to manage limb comfort overnight, trouble falling asleep, but not being awakened. Pt is pain free first thing in morning x1 hour, then pain starts and progressively worsens as the day goes on.  -5/10 pain current pain(bad with going up stairs, sitting on commode) -Worst pain 8/10 (toward end of day) -pain free in the morning (best pain) -other factors: Pt really notices the pain when she nears the top of her stairs (not using this arm  for railing use), also notices it more when sitting on the commode (suspect related to doffing LBD management as this is a position of aggravation.)  -pt able to manage her pain overnight, feels good in morning -taking tylenol, ibuprofen (alternating) twice daily AM /PM for pain  Pt denies any prior similar episodes, denies neck pain, denies headaches; pt denies any recent falls, trauma that would explain any occult fracture, dislocation.   PAIN:  Are you having pain? Yes 5/10 today    Pain at evlauation: Yellow=paresthesias; Red= tightness/sore pain   PRECAUTIONS: LUE restricted limb (s/p lymph node resection)   WEIGHT BEARING RESTRICTIONS: No  FALLS:  Has patient fallen in last 6 months? No  PLOF: Unrestricted Left arm use; full independent in ADL/IADL  PATIENT GOALS: Better pain control, unrestricted arm use   NEXT MD VISIT: 01/14/24 Dr. Smith Robert   OBJECTIVE:  Note: Objective measures were completed at Evaluation unless otherwise noted.  DIAGNOSTIC FINDINGS:  DXA 09/26/23: ASSESSMENT: The probability of a major osteoporotic fracture is 15.7% within the next ten years.   The probability of a hip fracture is 2.8% within the next ten years.  PATIENT SURVEYS:  QUICK DASH: 36.4 points   SENSATION: Not tested; pt reports upper brachial paresthesias   POSTURE: WNL  UPPER LIMB MOBILITY:  -significant intolerance to open chair active or passive left shoulder flexion >50 degrees -Left 'sling' posturing of arm is also poorly tolerated -defer formal goniometry to after adequate pain control has been achieved.  UPPER LIMB STRENGTH: -defer formal strength assessment to after adequate pain control has been achieved.  CERVICAL SPINE SCREENING:  -head position does not affect symptoms in arm -cervical ROM appears normal for age -Cervical A/ROM is not painfully restricted.    JOINT MOBILITY TESTING:  -Defer to after better pain control is established   PALPATION:  -Tolerates  mild to moderate pressure; humeral head feels appropriately positioned within the glenoid fossa.  TREATMENT DATE 12/30/23 -flexion table slides x25 (heavy instructional cues required to achieve desired intensity of range, as well as assisted ROM versus active ROM)  Fairly painful and poorly tolerated -seated cervical rotation alternating 10x bilat (no affect on symptoms)  -scapular restraction 10x3sec Feels good, makes arm feel better -Left shoulder adduction towel squeeze 10x3secH  Poorly tolerated, quite uncomfortable     PATIENT EDUCATION: Education details: Continue with scap squeezes; will not add HEP items today due to instruction needed and severeity of pain; pt would benefit from better analgesia to participate in PT  Person educated: Patient Education method: collaborative learning, deliberate practice, positive reinforcement, explicit instruction, establish rules. Education comprehension: verbalized understanding  HOME EXERCISE PROGRAM: Asked pt to continue with scapular retraction as issued by OT recently; Pt was unable to detail other exercises given by OT.  No additional items added today due to extensive cues needed for correct technique as well as unpredictable pain response.   ASSESSMENT:  CLINICAL IMPRESSION: Stephanie Hess is a 72yoF presenting to OPPT with acute onset Left shoulder pain/stiffness and left upper arm paresthesias. Referring provider indicates some role of post radiation neuropathic etiology. Screening today inconsistent with focal radicular pathology; pain response to basic movements and postures is uncommon and difficult to predict, unable to exhaustively perform orthopedic tests and measures. Today treatment largely aimed at finding non-pharmacological means of using movement to help with symptoms management. Pt needs extensive cues to  maintain interventions at a 'tolerable degree' as she naturally attempts to be pain averse, however Thereasa Parkin explains the need to perform ROM interventions regularly and in high volumes, hence, incompatible with significant pain aggravation. Discussed with pt that a higher degree of analgesia would allow for better participation in PT services. Will reach out to referring provider about this. Addiitonal screening and examination to follow as symptoms permit. Pt will benefit from skilled PT intervention to achieve ST and LT goals of care.   OBJECTIVE IMPAIRMENTS: decreased activity tolerance, increased muscle spasms, impaired flexibility, impaired UE functional use, and pain.   ACTIVITY LIMITATIONS: carrying, lifting, stairs, bathing, toileting, dressing, and reach over head  PARTICIPATION LIMITATIONS: meal prep, cleaning, laundry, and yard work  PERSONAL FACTORS: Education, Fitness, and Time since onset of injury/illness/exacerbation are also affecting patient's functional outcome.   REHAB POTENTIAL: Good  CLINICAL DECISION MAKING: Evolving/moderate complexity  EVALUATION COMPLEXITY: Moderate   GOALS: Goals reviewed with patient? No  SHORT TERM GOALS: Target date: 01/27/24  Improve Quick Dash Score by >12% to indicate reduced disability in ADL/household activity.  Baseline: 36.4 Goal status: INITIAL  2.  Pt to report 2-3 non medication tool to manage arm pain at home.  Baseline: Able to use rest at eval;  Goal status: INITIAL  3.  Pt to tolerate LUE MMT and ROM testing without pain exacerbation >2/10 points.  Baseline: eval unable to tolerate Goal status: INITIAL  LONG TERM GOALS: Target date: 03/28/24  Quick DASH improvement >16% for reduced disability in ADL/household actiivty.  Baseline:  Goal status: INITIAL  2.  Pt to demonstrate 5/5 strength in Left shoulder flexion/ABDCT/Extension/IR/ER.  Baseline: eval: not tolerant to testing Goal status: INITIAL  3.  Left shoulder  ROM in flexion, ABDCT, and ER within 85% of RUE.  Baseline: <50% of RUE at eval.  Goal status: INITIAL  4.  Independent performance of DC HEP for conitnued improvement in strength and ROM post DC.  Baseline: partial performance of beginner HEP.  Goal status: INITIAL  PLAN:  PT FREQUENCY:  1-2x/week  PT DURATION: 3 months   PLANNED INTERVENTIONS: 97110-Therapeutic exercises, 97530- Therapeutic activity, [id number]- Neuromuscular re-education, 97535- Self Care, 29562- Manual therapy, G0283- Electrical stimulation (unattended), [phone]- Electrical stimulation (manual), Patient/Family education, Taping, Dry Needling, Joint mobilization, Joint manipulation, Spinal manipulation, Spinal mobilization, and Moist heat  PLAN FOR NEXT SESSION:  Continue to find tolerable HEP activity; P/ROM when able, MMT when able; Gentle ROM, LUE sensory testing, consider trial LUE desensitization, FU with Dr. Smith Robert about analgesia options.    2:24 PM, 12/30/23 Rosamaria Lints, PT, DPT Physical Therapist - [facility] in [place]  [phone] (Office)      [place] C, PT 12/30/2023, 11:42 AM

## 2024-01-04 ENCOUNTER — Encounter: Payer: Self-pay | Admitting: Emergency Medicine

## 2024-01-04 ENCOUNTER — Ambulatory Visit
Admission: EM | Admit: 2024-01-04 | Discharge: 2024-01-04 | Disposition: A | Discharge status: Home / Self Care | Payer: Medicare Other | Attending: Family Medicine | Admitting: Family Medicine

## 2024-01-04 DIAGNOSIS — J069 Acute upper respiratory infection, unspecified: Secondary | ICD-10-CM | POA: Insufficient documentation

## 2024-01-04 DIAGNOSIS — J189 Pneumonia, unspecified organism: Secondary | ICD-10-CM | POA: Diagnosis present

## 2024-01-04 DIAGNOSIS — J441 Chronic obstructive pulmonary disease with (acute) exacerbation: Secondary | ICD-10-CM | POA: Diagnosis present

## 2024-01-04 LAB — RESP PANEL BY RT-PCR (RSV, FLU A&B, COVID)  RVPGX2
Influenza A by PCR: NEGATIVE
Influenza B by PCR: NEGATIVE
Resp Syncytial Virus by PCR: NEGATIVE
SARS Coronavirus 2 by RT PCR: NEGATIVE

## 2024-01-04 LAB — GROUP A STREP BY PCR: Group A Strep by PCR: NOT DETECTED

## 2024-01-04 MED ORDER — LEVOFLOXACIN 500 MG PO TABS: 500.0000 mg | ORAL_TABLET | Freq: Every day | ORAL | 0 refills | Status: DC

## 2024-01-04 MED ORDER — PREDNISONE 50 MG PO TABS: 50.0000 mg | ORAL_TABLET | Freq: Every day | ORAL | 0 refills | Status: AC

## 2024-01-04 NOTE — Discharge Instructions (Addendum)
 Your strep, influenza and COVID are all negative. Based on your outpatient chest xray, you may have pneumonia.  Stop by the pharmacy to pick up your prescriptions.  Follow up with your primary care provider or return to the urgent care, if not improving.

## 2024-01-04 NOTE — ED Triage Notes (Signed)
 Patient c/o cough, chest congestion, headache, chills and bodyaches that started yesterday.  Patient states that she was exposed to the flu last week.  Patient unsure of fevers.

## 2024-01-04 NOTE — ED Provider Notes (Signed)
 [facility]    CSN: [id number] Arrival date & time: 01/04/24  1010      History   Chief Complaint Chief Complaint  Patient presents with   Generalized Body Aches   Cough    HPI Stephanie Hess is a 73 y.o. female.   HPI  History obtained from the patient. Stephanie Hess presents for headache, sore throat, cough, chest congestion, rhinorrhea, chills and feeling feverish that started yesterday.  Has a terrible cough with green sputum.  She feels short of breath. Had diarrhea a couple days ago.  No vomiting. Her grandson had the flu last week.   She has COPD and still smokes but notes she has decreased her cigarette use since she has been sick.     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/2023   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) 07/08/2023   Aortic atherosclerosis (HCC) 05/23/2023   BMI 30.0-30.9,adult 03/07/2022   Generalized anxiety disorder 08/19/2021   Chronic obstructive pulmonary disease (HCC) 07/13/2021   Nicotine abuse 03/03/2018   Mild intermittent asthma without complication 03/03/2018   Pulmonary fibrosis (HCC) 03/03/2018   GERD (gastroesophageal reflux disease) 03/03/2018   Hyperlipidemia, mixed 05/15/2017   Vitamin D deficiency 05/15/2017    Past Surgical History:  Procedure Laterality Date   APPENDECTOMY     AXILLARY SENTINEL NODE BIOPSY Left 08/06/2023   Procedure: AXILLARY SENTINEL NODE BIOPSY;  Surgeon: Campbell Lerner, MD;  Location: [facility] ORS;  Service: General;  Laterality: Left;   BREAST  BIOPSY Left 07/02/2023   [place] LT BREAST BX W LOC DEV 1ST LESION IMG BX SPEC US GUIDE 07/02/2023 [facility]   BREAST LUMPECTOMY WITH RADIOFREQUENCY TAG IDENTIFICATION Left 08/06/2023   Procedure: BREAST LUMPECTOMY WITH RADIOFREQUENCY TAG IDENTIFICATION;  Surgeon: Campbell Lerner, MD;  Location: [facility] ORS;  Service: General;  Laterality: Left;   CESAREAN SECTION     ECTOPIC PREGNANCY SURGERY      OB History     Gravida  4   Para  4   Term  2   Preterm      AB      Living  2      SAB      IAB      Ectopic      Multiple      Live Births               Home Medications    Prior to Admission medications   Medication Sig Start Date End Date Taking? Authorizing Provider  levofloxacin (LEVAQUIN) 500 MG tablet Take 1 tablet (500 mg total) by mouth daily. 01/04/24  Yes Lindberg Zenon, DO  predniSONE (DELTASONE) 50 MG tablet Take 1 tablet (50 mg total) by mouth daily for 5 days. 01/04/24 01/09/24 Yes Yadira Hada, Seward Meth, DO  acetaminophen (TYLENOL) 500 MG tablet Take 1,000 mg by mouth every 6 (six) hours as needed.    [provider]  azithromycin (ZITHROMAX) 250 MG tablet Take 250 mg by mouth daily.  [provider]  budesonide (PULMICORT) 0.5 MG/2ML nebulizer solution Take 2 mLs by nebulization 2 (two) times daily. 05/11/23   [provider]  busPIRone (BUSPAR) 10 MG tablet TAKE 1/2 TO 1 TABLET UP TO THREE TIMES DAILY AS NEEDED FOR ANXIETY 05/23/23   Saralyn Pilar A, PA  formoterol (PERFOROMIST) 20 MCG/2ML nebulizer solution INHALE 2 ML (20 MCG TOTAL) BY NEBULIZATION TWO (2) TIMES A DAY. 09/18/21   [provider]  ipratropium (ATROVENT) 0.02 % nebulizer solution Take 2.5 mLs (0.5 mg total) by nebulization 4 (four) times daily. 08/08/21   Carlean Jews, NP  letrozole (FEMARA) 2.5 MG tablet Take 1 tablet (2.5 mg total) by mouth daily. Start after radiation is complete 09/03/23   Creig Hines, MD  levalbuterol [facility] HFA) 45 MCG/ACT  inhaler INHALE 1 PUFF INTO THE LUNGS EVERY 6 HOURS AS NEEDED FOR WHEEZING. 02/20/23   Boscia, Heather E, NP  LORazepam (ATIVAN) 0.5 MG tablet TAKE 0.5-1 TABLETS (0.25-0.5 MG TOTAL) BY MOUTH AS NEEDED FOR ANXIETY (FOR BREAKTHROUGH ANXIETY). 12/02/23   Melida Quitter, PA  omeprazole (PRILOSEC) 20 MG capsule Take 20 mg by mouth daily.    [provider]  OXYGEN Inhale into the lungs. 2 litre at night    [provider]  predniSONE (DELTASONE) 10 MG tablet Take 10 mg by mouth daily. 05/11/23   [provider]  silver sulfADIAZINE (SILVADENE) 1 % cream Apply 1 Application topically daily. 10/14/23   Carmina Miller, MD  traZODone (DESYREL) 50 MG tablet Take 0.5-1 tablets (25-50 mg total) by mouth at bedtime as needed. for sleep 05/23/23   Melida Quitter, PA    Family History Family History  Problem Relation Age of Onset   Breast cancer Paternal Aunt    Heart attack Mother     Social History Social History   Tobacco Use   Smoking status: Every Day    Current packs/day: 0.50    Average packs/day: 0.5 packs/day for 50.0 years (25.0 ttl pk-yrs)    Types: Cigarettes    Passive exposure: Past   Smokeless tobacco: Never  Vaping Use   Vaping status: Never Used  Substance Use Topics   Alcohol use: Yes    Comment: social   Drug use: No     Allergies   Penicillins, Shellfish allergy, Sulfa antibiotics, Clarithromycin, Amoxicillin-pot clavulanate, Iodinated contrast media, Atorvastatin, and Macrobid [nitrofurantoin]   Review of Systems Review of Systems: negative unless otherwise stated in HPI.      Physical Exam Triage Vital Signs ED Triage Vitals  Encounter Vitals Group     BP      Systolic BP Percentile      Diastolic BP Percentile      Pulse      Resp      Temp      Temp src      SpO2      Weight      Height      Head Circumference      Peak Flow      Pain Score      Pain Loc      Pain Education      Exclude from Growth Chart    No data  found.  Updated Vital Signs BP 98/67 (BP Location: Left Arm)   Pulse (!) 110   Temp 98.5 F (36.9 C) (Oral)   Resp 14   Ht 5\' 2"  (1.575 m)   Wt 72.6 kg   SpO2  96%   BMI 29.27 kg/m   Visual Acuity Right Eye Distance:   Left Eye Distance:   Bilateral Distance:    Right Eye Near:   Left Eye Near:    Bilateral Near:     Physical Exam GEN:     alert, ill but non-toxic appearing female in no distress    HENT:  mucus membranes moist, oropharyngeal without lesions or erythema, no tonsillar hypertrophy or exudates, clear nasal discharge EYES:   no scleral injection or discharge RESP:  no increased work of breathing, coarse breathe sounds bilaterally, scattered expiratory wheezing  CVS:   regular rhythm, tachycardic  Skin:   warm and dry    UC Treatments / Results  Labs (all labs ordered are listed, but only abnormal results are displayed) Labs Reviewed  RESP PANEL BY RT-PCR (RSV, FLU A&B, COVID)  [id number]  GROUP A STREP BY PCR    EKG   Radiology No results found.  Procedures Procedures (including critical care time)  Medications Ordered in UC Medications - No data to display  Initial Impression / Assessment and Plan / UC Course  I have reviewed the triage vital signs and the nursing notes.  Pertinent labs & imaging results that were available during my care of the patient were reviewed by me and considered in my medical decision making (see chart for details).       Pt is a 73 y.o. female who has COPD and pulmonary fibrosis presents for 2 days of respiratory symptoms. Stephanie Hess is tachycardic but afebrile here without recent antipyretics. Satting well on room air. Overall pt is ill but non-toxic appearing, well hydrated, without respiratory distress. Pulmonary exam is remarkable for coarse breathe sounds and expiratory wheezing. Strep PCR is negative.  COVID, RSV and influenza panel obtained and was negative. Discussed a chest xray and pt states she had one a few weeks  ago that was read but she doesn't know the results. She has minimal patchy opacities in the lingula, atelectasis versus pneumonia. I am concerned she may have a concomitant COPD exacerbation. Treat with prednisone burst and Levaquin.  Pt to hold home prednisone and azithromycin.  Typical duration of symptoms discussed.   Return and ED precautions given and voiced understanding. Discussed MDM, treatment plan and plan for follow-up with patient who agrees with plan.     Final Clinical Impressions(s) / UC Diagnoses   Final diagnoses:  Community acquired pneumonia, unspecified laterality  COPD exacerbation (HCC)  URI with cough and congestion     Discharge Instructions      Your strep, influenza and COVID are all negative. Based on your outpatient chest xray, you may have pneumonia.  Stop by the pharmacy to pick up your prescriptions.  Follow up with your primary care provider or return to the urgent care, if not improving.       ED Prescriptions     Medication Sig Dispense Auth. Provider   levofloxacin (LEVAQUIN) 500 MG tablet Take 1 tablet (500 mg total) by mouth daily. 7 tablet Jermya Dowding, DO   predniSONE (DELTASONE) 50 MG tablet Take 1 tablet (50 mg total) by mouth daily for 5 days. 5 tablet Katha Cabal, DO      PDMP not reviewed this encounter.   Katha Cabal, DO 01/04/24 1301

## 2024-01-05 ENCOUNTER — Ambulatory Visit: Payer: Self-pay | Admitting: Family Medicine

## 2024-01-05 ENCOUNTER — Ambulatory Visit: Payer: Medicare Other

## 2024-01-05 ENCOUNTER — Encounter: Payer: Self-pay | Admitting: Physical Therapy

## 2024-01-05 ENCOUNTER — Ambulatory Visit (INDEPENDENT_AMBULATORY_CARE_PROVIDER_SITE_OTHER): Payer: Medicare Other | Admitting: Family Medicine

## 2024-01-05 DIAGNOSIS — M25612 Stiffness of left shoulder, not elsewhere classified: Secondary | ICD-10-CM

## 2024-01-05 DIAGNOSIS — J440 Chronic obstructive pulmonary disease with acute lower respiratory infection: Secondary | ICD-10-CM | POA: Diagnosis not present

## 2024-01-05 DIAGNOSIS — R293 Abnormal posture: Secondary | ICD-10-CM

## 2024-01-05 DIAGNOSIS — M25512 Pain in left shoulder: Secondary | ICD-10-CM | POA: Diagnosis not present

## 2024-01-05 NOTE — Therapy (Signed)
 OUTPATIENT PHYSICAL THERAPY EVALUATION   Patient Name: Stephanie Hess MRN: [id number] DOB:1951-10-22, 73 y.o., female Today's Date: 01/05/2024  END OF SESSION:  PT End of Session - 01/05/24 1541     Visit Number 2    Number of Visits 24    Date for PT Re-Evaluation 03/28/24    Authorization Type [employer] Medicare    Authorization Time Period 12/30/23-03/28/24    Progress Note Due on Visit 10    PT Start Time 1545    PT Stop Time 1557    PT Time Calculation (min) 12 min    Activity Tolerance Patient tolerated treatment well;Patient limited by pain    Behavior During Therapy [facility] for tasks assessed/performed             Past Medical History:  Diagnosis Date   Cancer (HCC)    COPD (chronic obstructive pulmonary disease) (HCC)    Diverticula of colon    GERD (gastroesophageal reflux disease)    Hyperlipidemia    Renal insufficiency    2009 or 2010   Past Surgical History:  Procedure Laterality Date   APPENDECTOMY     AXILLARY SENTINEL NODE BIOPSY Left 08/06/2023   Procedure: AXILLARY SENTINEL NODE BIOPSY;  Surgeon: Campbell Lerner, MD;  Location: [facility] ORS;  Service: General;  Laterality: Left;   BREAST BIOPSY Left 07/02/2023   [place] LT BREAST BX W LOC DEV 1ST LESION IMG BX SPEC US GUIDE 07/02/2023 [facility]   BREAST LUMPECTOMY WITH RADIOFREQUENCY TAG IDENTIFICATION Left 08/06/2023   Procedure: BREAST LUMPECTOMY WITH RADIOFREQUENCY TAG IDENTIFICATION;  Surgeon: Campbell Lerner, MD;  Location: [facility] ORS;  Service: General;  Laterality: Left;   CESAREAN SECTION     ECTOPIC PREGNANCY SURGERY     Patient Active Problem List   Diagnosis Date Noted   Statin intolerance 12/18/2023   Osteopenia of neck of left femur 12/18/2023   Malignant neoplasm of upper-outer quadrant of left breast in female, 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/2023   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) 07/08/2023   Aortic atherosclerosis (HCC) 05/23/2023   BMI 30.0-30.9,adult  03/07/2022   Generalized anxiety disorder 08/19/2021   Chronic obstructive pulmonary disease (HCC) 07/13/2021   Nicotine abuse 03/03/2018   Mild intermittent asthma without complication 03/03/2018   Pulmonary fibrosis (HCC) 03/03/2018   GERD (gastroesophageal reflux disease) 03/03/2018   Hyperlipidemia, mixed 05/15/2017   Vitamin D deficiency 05/15/2017    PCP: Alyson Reedy, FNP  REFERRING PROVIDER: Owens Shark, MD (oncologist)   REFERRING DIAG: Left shoulder pain   THERAPY DIAG:  Acute pain of left shoulder  Stiffness of left shoulder, not elsewhere classified  Abnormal posture  Rationale for Evaluation and Treatment: Rehabilitation   ONSET DATE: January 2025  SUBJECTIVE:  SUBJECTIVE STATEMENT: Pt referred here by oncology for post radiation shoulder pain. Screened by OT at  cancer center who believes PT would be of benefit.   PERTINENT HISTORY: Stephanie Hess is a 72yo right handed female who reports having pain in her left shoulder, a lot of tightness, and paresthesias in left lateral upper arm. There is also some tightness in left upper trap to a lesser degree of pain. This started in January. Pt reports having Left axillary lymph node resection September and completed left radiation in December. MD reports her symptoms are attributed to nerve regeneration post radiation. OT reports her left shoulder muscles are angry.   Pt reports able to manage limb comfort overnight, trouble falling asleep, but not being awakened. Pt is pain free first thing in morning x1 hour, then pain starts and progressively worsens as the day goes on.  -5/10 pain current pain(bad with going up stairs, sitting on commode) -Worst pain 8/10 (toward end of day) -pain free in the morning (best pain) -other factors: Pt  really notices the pain when she nears the top of her stairs (not using this arm for railing use), also notices it more when sitting on the commode (suspect related to doffing LBD management as this is a position of aggravation.)  -pt able to manage her pain overnight, feels good in morning -taking tylenol, ibuprofen (alternating) twice daily AM /PM for pain  Pt denies any prior similar episodes, denies neck pain, denies headaches; pt denies any recent falls, trauma that would explain any occult fracture, dislocation.   PAIN:  Are you having pain? Yes 5/10 today    Pain at evlauation: Yellow=paresthesias; Red= tightness/sore pain   PRECAUTIONS: LUE restricted limb (s/p lymph node resection)   WEIGHT BEARING RESTRICTIONS: No  FALLS:  Has patient fallen in last 6 months? No  PLOF: Unrestricted Left arm use; full independent in ADL/IADL  PATIENT GOALS: Better pain control, unrestricted arm use   NEXT MD VISIT: 01/14/24 Dr. Smith Robert   OBJECTIVE:  Note: Objective measures were completed at Evaluation unless otherwise noted.  DIAGNOSTIC FINDINGS:  DXA 09/26/23: ASSESSMENT: The probability of a major osteoporotic fracture is 15.7% within the next ten years.   The probability of a hip fracture is 2.8% within the next ten years.  PATIENT SURVEYS:  QUICK DASH: 36.4 points   SENSATION: Not tested; pt reports upper brachial paresthesias   POSTURE: WNL  UPPER LIMB MOBILITY:  -significant intolerance to open chair active or passive left shoulder flexion >50 degrees -Left 'sling' posturing of arm is also poorly tolerated -defer formal goniometry to after adequate pain control has been achieved.  UPPER LIMB STRENGTH: -defer formal strength assessment to after adequate pain control has been achieved.  CERVICAL SPINE SCREENING:  -head position does not affect symptoms in arm -cervical ROM appears normal for age -Cervical A/ROM is not painfully restricted.    JOINT MOBILITY TESTING:   -Defer to after better pain control is established   PALPATION:  -Tolerates mild to moderate pressure; humeral head feels appropriately positioned within the glenoid fossa.  TREATMENT DATE 01/05/24  Subjective: Patient recently diagnosed with pneumonia (01/04/24). Reports being on home oxygen all day Saturday prior to seeking medical attention at urgent care on 01/04/24. Comes in with increased RR, tachycardia, and spO2 93-94% along with potential fever as she had fever earlier in the day.   Educated patient on need to seek medical attention due to current symptoms which are worsening from previous date when she sought medical attention at urgent care, patient reports understanding. In agreement to ending session early due to symptoms and tolerance.    Not today:  -flexion table slides x25 (heavy instructional cues required to achieve desired intensity of range, as well as assisted ROM versus active ROM)  Fairly painful and poorly tolerated -seated cervical rotation alternating 10x bilat (no affect on symptoms)  -scapular restraction 10x3sec Feels good, makes arm feel better -Left shoulder adduction towel squeeze 10x3secH  Poorly tolerated, quite uncomfortable     PATIENT EDUCATION: Education details: Continue with scap squeezes; will not add HEP items today due to instruction needed and severeity of pain; pt would benefit from better analgesia to participate in PT  Person educated: Patient Education method: collaborative learning, deliberate practice, positive reinforcement, explicit instruction, establish rules. Education comprehension: verbalized understanding  HOME EXERCISE PROGRAM: Asked pt to continue with scapular retraction as issued by OT recently; Pt was unable to detail other exercises given by OT.  No additional items added today due to extensive cues  needed for correct technique as well as unpredictable pain response.   ASSESSMENT:  CLINICAL IMPRESSION:  Patient arrives to treatment session with increased RR at rest, tachycardia (118 bpm), and spO2 93-94% at rest as well as potential fever (patient reports having fever earlier in the day). Patient with low tone in voice due to difficulty breathing and difficulty completing full sentences. Educated patient on need to seek medical attention 2/2 symptoms as well as worsening symptoms from urgent care visit on 01/04/24, patient reports understanding. In agreement to end session early for patient to seek attention. Will follow up next session.    From Eval:  Stephanie Hess is a 72yoF presenting to OPPT with acute onset Left shoulder pain/stiffness and left upper arm paresthesias. Referring provider indicates some role of post radiation neuropathic etiology. Screening today inconsistent with focal radicular pathology; pain response to basic movements and postures is uncommon and difficult to predict, unable to exhaustively perform orthopedic tests and measures. Today treatment largely aimed at finding non-pharmacological means of using movement to help with symptoms management. Pt needs extensive cues to maintain interventions at a 'tolerable degree' as she naturally attempts to be pain averse, however Thereasa Parkin explains the need to perform ROM interventions regularly and in high volumes, hence, incompatible with significant pain aggravation. Discussed with pt that a higher degree of analgesia would allow for better participation in PT services. Will reach out to referring provider about this. Addiitonal screening and examination to follow as symptoms permit. Pt will benefit from skilled PT intervention to achieve ST and LT goals of care.   OBJECTIVE IMPAIRMENTS: decreased activity tolerance, increased muscle spasms, impaired flexibility, impaired UE functional use, and pain.   ACTIVITY LIMITATIONS: carrying,  lifting, stairs, bathing, toileting, dressing, and reach over head  PARTICIPATION LIMITATIONS: meal prep, cleaning, laundry, and yard work  PERSONAL FACTORS: Education, Fitness, and Time since onset of injury/illness/exacerbation are also affecting patient's functional outcome.   REHAB POTENTIAL: Good  CLINICAL DECISION MAKING: Evolving/moderate complexity  EVALUATION COMPLEXITY: Moderate   GOALS: Goals reviewed  with patient? No  SHORT TERM GOALS: Target date: 01/27/24  Improve Quick Dash Score by >12% to indicate reduced disability in ADL/household activity.  Baseline: 36.4 Goal status: INITIAL  2.  Pt to report 2-3 non medication tool to manage arm pain at home.  Baseline: Able to use rest at eval;  Goal status: INITIAL  3.  Pt to tolerate LUE MMT and ROM testing without pain exacerbation >2/10 points.  Baseline: eval unable to tolerate Goal status: INITIAL  LONG TERM GOALS: Target date: 03/28/24  Quick DASH improvement >16% for reduced disability in ADL/household actiivty.  Baseline:  Goal status: INITIAL  2.  Pt to demonstrate 5/5 strength in Left shoulder flexion/ABDCT/Extension/IR/ER.  Baseline: eval: not tolerant to testing Goal status: INITIAL  3.  Left shoulder ROM in flexion, ABDCT, and ER within 85% of RUE.  Baseline: <50% of RUE at eval.  Goal status: INITIAL  4.  Independent performance of DC HEP for conitnued improvement in strength and ROM post DC.  Baseline: partial performance of beginner HEP.  Goal status: INITIAL  PLAN:  PT FREQUENCY: 1-2x/week  PT DURATION: 3 months   PLANNED INTERVENTIONS: 97110-Therapeutic exercises, 97530- Therapeutic activity, [id number]- Neuromuscular re-education, 97535- Self Care, [id number]- Manual therapy, [id number]- Electrical stimulation (unattended), [phone]- Electrical stimulation (manual), Patient/Family education, Taping, Dry Needling, Joint mobilization, Joint manipulation, Spinal manipulation, Spinal mobilization, and Moist  heat  PLAN FOR NEXT SESSION:  Continue to find tolerable HEP activity; P/ROM when able, MMT when able; Gentle ROM, LUE sensory testing, consider trial LUE desensitization, FU with Dr. Smith Robert about analgesia options.    Maylon Peppers, PT, DPT Physical Therapist - [facility] 01/05/2024, 4:19 PM

## 2024-01-05 NOTE — Telephone Encounter (Addendum)
 Chief Complaint: SOB Symptoms: SOB, cough, congestion Frequency: "Feeling bad" since Saturday, ongoing SOB as well Pertinent Negatives: Patient denies CP Disposition: [x] ED /[] Urgent Care (no appt availability in office) / [] Appointment(In office/virtual)/ []  [facility]/ [] Home Care/ [] Refused Recommended Disposition /[] [facility]/ []  Follow-up with PCP Additional Notes: Pt calls in today with concerns of pneumonia and SOB. Diagnosed with pneumonia at [facility] yesterday, prescribed prednisone and antibiotics which she said she picked up and started taking. Declined a CXR with UC stating she had one earlier this month. Pt states her SOB is ongoing (hx COPD). At times pt denies SOB, but sounds SOB at rest sitting in the car to the RN. Pt calling from the parking lot at the PCP office. Pt states PT could not work with her today because they did not want to overexert her. PT notes state she was febrile, tachypneic, and tachycardic with an O2 of 93-94. Pt denies O2 use during the day, only at night. Endorses congestion, green sputum. Denies CP. Pt sounds SOB to the RN. RN advised pt she needs to go to the ED and RN told pt RN would like to call 911 for her. Pt agreeable to go to the ED but declined EMS transport. RN educated pt on why the ED via EMS is the best, safest option, pt still declined. Pt said she can drive there, RN asked she not do that. RN called the CAL. CAL went out to the parking lot. RN could hear pt conversing with office staff in the parking lot who said they would talk to the provider. RN hung up.   Copied from CRM [id number]. Topic: Clinical - Red Word Triage >> Jan 05, 2024  4:12 PM Dennison Nancy wrote: Red Word that prompted transfer to Nurse Triage: patient have shortness of breath shortness of breath,patient heart rate is up  patient stated her physical therapist said her  heart rate is up Reason for Disposition  [1] MODERATE difficulty breathing (e.g., speaks in  phrases, SOB even at rest, pulse 100-120) AND [2] NEW-onset or WORSE than normal  Answer Assessment - Initial Assessment Questions 1. RESPIRATORY STATUS: "Describe your breathing?" (e.g., wheezing, shortness of breath, unable to speak, severe coughing)      SOB, "a little" wheezing, pt sounds SOB on the phone with RN 2. ONSET: "When did this breathing problem begin?"      Started feeling bad Saturday, went to UC yesterday. Urgent Care said "I have might pneumonia" 3. PATTERN "Does the difficult breathing come and go, or has it been constant since it started?"      Come and go 4. SEVERITY: "How bad is your breathing?" (e.g., mild, moderate, severe)    - MILD: No SOB at rest, mild SOB with walking, speaks normally in sentences, can lie down, no retractions, pulse < 100.    - MODERATE: SOB at rest, SOB with minimal exertion and prefers to sit, cannot lie down flat, speaks in phrases, mild retractions, audible wheezing, pulse 100-120.    - SEVERE: Very SOB at rest, speaks in single words, struggling to breathe, sitting hunched forward, retractions, pulse > 120      Pt denies SOB but RN feels that pt sounds SOB in the phone while sitting in her car 5. RECURRENT SYMPTOM: "Have you had difficulty breathing before?" If Yes, ask: "When was the last time?" and "What happened that time?"      Hx COPD and pulm fibrosis 6. CARDIAC HISTORY: "Do you  have any history of heart disease?" (e.g., heart attack, angina, bypass surgery, angioplasty)      Atherosclerosis 7. LUNG HISTORY: "Do you have any history of lung disease?"  (e.g., pulmonary embolus, asthma, emphysema)     COPD 8. CAUSE: "What do you think is causing the breathing problem?"      Pneumonia @ UC yesterday 2/23 9. OTHER SYMPTOMS: "Do you have any other symptoms? (e.g., dizziness, runny nose, cough, chest pain, fever)     Cough, green sputum, chills, febrile today at PT 10. O2 SATURATION MONITOR:  "Do you use an oxygen saturation monitor (pulse  oximeter) at home?" If Yes, ask: "What is your reading (oxygen level) today?" "What is your usual oxygen saturation reading?" (e.g., 95%)       Pt in her car  Protocols used: Breathing Difficulty-A-AH

## 2024-01-06 ENCOUNTER — Telehealth: Payer: Self-pay | Admitting: Family Medicine

## 2024-01-06 ENCOUNTER — Inpatient Hospital Stay (HOSPITAL_BASED_OUTPATIENT_CLINIC_OR_DEPARTMENT_OTHER): Payer: Medicare Other | Admitting: Hospice and Palliative Medicine

## 2024-01-06 ENCOUNTER — Encounter: Payer: Self-pay | Admitting: Family Medicine

## 2024-01-06 ENCOUNTER — Encounter: Payer: Self-pay | Admitting: Hospice and Palliative Medicine

## 2024-01-06 VITALS — BP 128/64 | HR 93 | Temp 96.9°F | Resp 18 | Wt 162.1 lb

## 2024-01-06 DIAGNOSIS — K219 Gastro-esophageal reflux disease without esophagitis: Secondary | ICD-10-CM | POA: Diagnosis not present

## 2024-01-06 DIAGNOSIS — Z7951 Long term (current) use of inhaled steroids: Secondary | ICD-10-CM | POA: Diagnosis not present

## 2024-01-06 DIAGNOSIS — Z79899 Other long term (current) drug therapy: Secondary | ICD-10-CM | POA: Diagnosis not present

## 2024-01-06 DIAGNOSIS — C50412 Malignant neoplasm of upper-outer quadrant of left female breast: Secondary | ICD-10-CM

## 2024-01-06 DIAGNOSIS — M25512 Pain in left shoulder: Secondary | ICD-10-CM | POA: Diagnosis not present

## 2024-01-06 DIAGNOSIS — Z170 Estrogen receptor positive status [ER+]: Secondary | ICD-10-CM

## 2024-01-06 DIAGNOSIS — E785 Hyperlipidemia, unspecified: Secondary | ICD-10-CM | POA: Diagnosis not present

## 2024-01-06 DIAGNOSIS — C50912 Malignant neoplasm of unspecified site of left female breast: Secondary | ICD-10-CM | POA: Diagnosis present

## 2024-01-06 DIAGNOSIS — J449 Chronic obstructive pulmonary disease, unspecified: Secondary | ICD-10-CM | POA: Diagnosis not present

## 2024-01-06 DIAGNOSIS — Z79811 Long term (current) use of aromatase inhibitors: Secondary | ICD-10-CM | POA: Diagnosis not present

## 2024-01-06 DIAGNOSIS — J440 Chronic obstructive pulmonary disease with acute lower respiratory infection: Secondary | ICD-10-CM | POA: Diagnosis not present

## 2024-01-06 DIAGNOSIS — Z7952 Long term (current) use of systemic steroids: Secondary | ICD-10-CM | POA: Diagnosis not present

## 2024-01-06 NOTE — Telephone Encounter (Signed)
 Called patient to see how she is feeling. She reports that she slept well on 2L of oxygen last night and has taken her second day of Levaquin. Her oxygen saturation this morning is 94% and HR 110. She is able to speak in full sentences and does not sound in respiratory distress. Discussed when to return to office and when to seek emergency care. Advised patient to reach out if she does not feel better by the end of the week, as it may be reasonable to repeat CXR.   Alyson Reedy, FNP-C [facility] at [facility]

## 2024-01-06 NOTE — Progress Notes (Signed)
 Acute Care Office Visit  Subjective:   Stephanie Hess 19-Oct-1951 01/05/2024  No chief complaint on file.   HPI: Stephanie Hess is a pleasant 73 year old female patient with shortness of breath. She reports productive cough and fatigue. She has significant history of COPD and still is smoking.  Patient was seen yesterday at [facility] for these same symptoms. She was prescribed prednisone burst and Levaquin. She started taking this abx today.   Vital signs obtained with patient in car HR 118-128 SpO2 94-97% RR 22-24  The following portions of the patient's history were reviewed and updated as appropriate: past medical history, past surgical history, family history, social history, allergies, medications, and problem list.   Patient Active Problem List   Diagnosis Date Noted   Statin intolerance 12/18/2023   Osteopenia of neck of left femur 12/18/2023   Malignant neoplasm of upper-outer quadrant of left breast in female, 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/2023   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) 07/08/2023   Aortic atherosclerosis (HCC) 05/23/2023   BMI 30.0-30.9,adult 03/07/2022   Generalized anxiety disorder 08/19/2021   Chronic obstructive pulmonary disease (HCC) 07/13/2021   Nicotine abuse 03/03/2018   Mild intermittent asthma without complication 03/03/2018   Pulmonary fibrosis (HCC) 03/03/2018   GERD (gastroesophageal reflux disease) 03/03/2018   Hyperlipidemia, mixed 05/15/2017   Vitamin D deficiency 05/15/2017   Past Medical History:  Diagnosis Date   Cancer (HCC)    COPD (chronic obstructive pulmonary disease) (HCC)    Diverticula of colon    GERD (gastroesophageal reflux disease)    Hyperlipidemia    Renal insufficiency    2009 or 2010   Past Surgical History:  Procedure Laterality Date   APPENDECTOMY     AXILLARY SENTINEL NODE BIOPSY Left 08/06/2023   Procedure: AXILLARY SENTINEL NODE BIOPSY;  Surgeon: Campbell Lerner,  MD;  Location: [facility] ORS;  Service: General;  Laterality: Left;   BREAST BIOPSY Left 07/02/2023   [place] LT BREAST BX W LOC DEV 1ST LESION IMG BX SPEC US GUIDE 07/02/2023 [facility]   BREAST LUMPECTOMY WITH RADIOFREQUENCY TAG IDENTIFICATION Left 08/06/2023   Procedure: BREAST LUMPECTOMY WITH RADIOFREQUENCY TAG IDENTIFICATION;  Surgeon: Campbell Lerner, MD;  Location: [facility] ORS;  Service: General;  Laterality: Left;   CESAREAN SECTION     ECTOPIC PREGNANCY SURGERY     Family History  Problem Relation Age of Onset   Breast cancer Paternal Aunt    Heart attack Mother    Outpatient Medications Prior to Visit  Medication Sig Dispense Refill   acetaminophen (TYLENOL) 500 MG tablet Take 1,000 mg by mouth every 6 (six) hours as needed.     azithromycin (ZITHROMAX) 250 MG tablet Take 250 mg by mouth daily.     budesonide (PULMICORT) 0.5 MG/2ML nebulizer solution Take 2 mLs by nebulization 2 (two) times daily.     busPIRone (BUSPAR) 10 MG tablet TAKE 1/2 TO 1 TABLET UP TO THREE TIMES DAILY AS NEEDED FOR ANXIETY 270 tablet 2   formoterol (PERFOROMIST) 20 MCG/2ML nebulizer solution INHALE 2 ML (20 MCG TOTAL) BY NEBULIZATION TWO (2) TIMES A DAY.     ipratropium (ATROVENT) 0.02 % nebulizer solution Take 2.5 mLs (0.5 mg total) by nebulization 4 (four) times daily. 75 mL 3   letrozole (FEMARA) 2.5 MG tablet Take 1 tablet (2.5 mg total) by mouth daily. Start after radiation is complete 30 tablet 6   levalbuterol (XOPENEX HFA) 45 MCG/ACT inhaler INHALE 1 PUFF INTO THE  LUNGS EVERY 6 HOURS AS NEEDED FOR WHEEZING. 30 each 2   levofloxacin (LEVAQUIN) 500 MG tablet Take 1 tablet (500 mg total) by mouth daily. 7 tablet 0   LORazepam (ATIVAN) 0.5 MG tablet TAKE 0.5-1 TABLETS (0.25-0.5 MG TOTAL) BY MOUTH AS NEEDED FOR ANXIETY (FOR BREAKTHROUGH ANXIETY). 60 tablet 0   omeprazole (PRILOSEC) 20 MG capsule Take 20 mg by mouth daily.     OXYGEN Inhale into the lungs. 2 litre at night     predniSONE (DELTASONE) 10 MG  tablet Take 10 mg by mouth daily.     predniSONE (DELTASONE) 50 MG tablet Take 1 tablet (50 mg total) by mouth daily for 5 days. 5 tablet 0   silver sulfADIAZINE (SILVADENE) 1 % cream Apply 1 Application topically daily. 50 g 0   traZODone (DESYREL) 50 MG tablet Take 0.5-1 tablets (25-50 mg total) by mouth at bedtime as needed. for sleep 90 tablet 1   No facility-administered medications prior to visit.   Allergies  Allergen Reactions   Penicillins Nausea And Vomiting and Other (See Comments)    Has patient had a PCN reaction causing immediate rash, facial/tongue/throat swelling, SOB or lightheadedness with hypotension: No Has patient had a PCN reaction causing severe rash involving mucus membranes or skin necrosis: No Has patient had a PCN reaction that required hospitalization: No Has patient had a PCN reaction occurring within the last 10 years: Yes If all of the above answers are "NO", then may proceed with Cephalosporin use.    Shellfish Allergy Nausea And Vomiting   Sulfa Antibiotics Rash and Other (See Comments)    Other reaction(s): Unknown   Clarithromycin Rash and Nausea Only    rash   Amoxicillin-Pot Clavulanate Nausea And Vomiting   Iodinated Contrast Media Other (See Comments)    acute renal insufficiency acute renal insufficiency   Atorvastatin Rash   Macrobid [Nitrofurantoin] Rash     ROS: A complete ROS was performed with pertinent positives/negatives noted in the HPI. The remainder of the ROS are negative.    Objective:   There were no vitals filed for this visit.  GENERAL: Well-appearing, in NAD. Well nourished.  SKIN: Pink, warm and dry. No rash, lesion, ulceration, or ecchymoses.  Head: Normocephalic. THROAT: Uvula midline. Oropharynx clear. Tonsils non-inflamed without exudate. Mucous membranes pink and moist.  RESPIRATORY: Chest wall symmetrical. Respirations even and non-labored. Breath sounds coarse throughout all posterior lung fields with expiratory  wheezing present.  CARDIAC: S1, S2 present, regular rate and rhythm without murmur or gallops. Peripheral pulses 2+ bilaterally.  MSK: Muscle tone and strength appropriate for age. Joints w/o tenderness, redness, or swelling.  EXTREMITIES: Without clubbing, cyanosis, or edema.  NEUROLOGIC: No motor or sensory deficits. Steady, even gait. C2-C12 intact.  PSYCH/MENTAL STATUS: Alert, oriented x 3. Cooperative, appropriate mood and affect.     Assessment & Plan:   1. Chronic obstructive pulmonary disease with acute lower respiratory infection (HCC) (Primary) Patient is a 73 year-old female who has COPD and pulmonary fibrosis who presents for respiratory symptoms. She is tachycardic but denies recent fever/chills. Oxygen saturation between 94-97% on room air throughout the visit. Patient in no acute respiratory distress and is non-toxic appearing. Denies chest pain, lower extremity edema, vision changes, headaches. Cardiovascular exam with elevated heart rate and regular rhythm. Normal heart sounds, no murmurs present. Pulmonary exam is remarkable for coarse breath sounds throughout all posterior lung fields and expiratory respiratory wheezing. Patient had CXR completed on 2/11 with "atelectasis versus pneumonia."  No other imaging was obtained after this. Patient most likely experiencing acute COPD exacerbation due to pneumonia. Advised her to continue with current steroid and antibiotic. Discussion improvement of symptoms on antibiotic, importance of adequate hydration, and when to seek emergency care. If she continues to experience shortness of breath, would be reasonable to repeat CXR.   Lab Orders  No laboratory test(s) ordered today   No images are attached to the encounter or orders placed in the encounter.  Return if symptoms worsen or fail to improve.    Patient to reach out to office if new, worrisome, or unresolved symptoms arise or if no improvement in patient's condition. Patient  verbalized understanding and is agreeable to treatment plan. All questions answered to patient's satisfaction.    Alyson Reedy, FNP

## 2024-01-06 NOTE — Progress Notes (Signed)
 Patient is unaware of reason for visit.  Currently being treated for pneumonia.

## 2024-01-06 NOTE — Progress Notes (Signed)
 Symptom Management Clinic [facility] at [facility] Telephone:([phone]) [phone] [phone]  Patient Care Team: Alyson Reedy, FNP as PCP - General (Family Medicine) Hulen Luster, RN as Oncology Nurse Navigator Creig Hines, MD as Consulting Physician (Oncology)   NAME OF PATIENT: Stephanie Hess  [id number]  May 30, 1951   DATE OF VISIT: 01/06/24  REASON FOR CONSULT: Stephanie Hess is a 73 y.o. female with multiple medical problems including stage Ib ER/PR positive breast cancer status postlumpectomy and XRT, currently on letrozole.  COPD on intermittent O2.  INTERVAL HISTORY: Patient seen recently in the emergency department on 01/04/2024 with cough and respiratory congestion.  Concern was for COPD exacerbation versus pneumonia.  Patient was started on steroids and antibiotics.  She subsequently also had follow-up with her PCP.  Patient recently also developed left shoulder pain.  However, has been working with physical therapy and reports overall improvement in symptoms.  Patient presents [facility] today for follow-up.  She denies any acute symptoms or issues today.  Overall, she says that she feels improved both from a breathing standpoint and with pain.  Denies any neurologic complaints. Denies any easy bleeding or bruising. Reports good appetite and denies weight loss. Denies chest pain. Denies any nausea, vomiting, constipation, or diarrhea. Denies urinary complaints. Patient offers no further specific complaints today.   PAST MEDICAL HISTORY: Past Medical History:  Diagnosis Date   Cancer [facility])    COPD (chronic obstructive pulmonary disease) (HCC)    Diverticula of colon    GERD (gastroesophageal reflux disease)    Hyperlipidemia    Renal insufficiency    2009 or 2010    PAST SURGICAL HISTORY:  Past Surgical History:  Procedure Laterality Date   APPENDECTOMY     AXILLARY SENTINEL NODE BIOPSY Left 08/06/2023   Procedure: AXILLARY SENTINEL NODE  BIOPSY;  Surgeon: Campbell Lerner, MD;  Location: [facility] ORS;  Service: General;  Laterality: Left;   BREAST BIOPSY Left 07/02/2023   [place] LT BREAST BX W LOC DEV 1ST LESION IMG BX SPEC US GUIDE 07/02/2023 [facility]   BREAST LUMPECTOMY WITH RADIOFREQUENCY TAG IDENTIFICATION Left 08/06/2023   Procedure: BREAST LUMPECTOMY WITH RADIOFREQUENCY TAG IDENTIFICATION;  Surgeon: Campbell Lerner, MD;  Location: [facility] ORS;  Service: General;  Laterality: Left;   CESAREAN SECTION     ECTOPIC PREGNANCY SURGERY      HEMATOLOGY/ONCOLOGY HISTORY:  Oncology History  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 [facility])  07/07/2023 Cancer Staging   Staging form: Breast, AJCC 8th Edition - Clinical stage from 07/07/2023: Stage IA (cT1b, cN0, cM0, G2, ER+, PR+, HER2-) - Signed by Creig Hines, MD on 07/13/2023 Histologic grading system: 3 grade system   07/08/2023 Initial Diagnosis   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  MEDICATIONS:  Current Outpatient Medications  Medication Sig Dispense Refill   acetaminophen (TYLENOL) 500 MG tablet Take 1,000 mg by mouth every 6 (six) hours as needed.     budesonide (PULMICORT) 0.5 MG/2ML nebulizer solution Take 2 mLs by nebulization 2 (two)  times daily.     busPIRone (BUSPAR) 10 MG tablet TAKE 1/2 TO 1 TABLET UP TO THREE TIMES DAILY AS NEEDED FOR ANXIETY 270 tablet 2   formoterol (PERFOROMIST) 20 MCG/2ML nebulizer solution INHALE 2  ML (20 MCG TOTAL) BY NEBULIZATION TWO (2) TIMES A DAY.     ipratropium (ATROVENT) 0.02 % nebulizer solution Take 2.5 mLs (0.5 mg total) by nebulization 4 (four) times daily. 75 mL 3   letrozole (FEMARA) 2.5 MG tablet Take 1 tablet (2.5 mg total) by mouth daily. Start after radiation is complete 30 tablet 6   levalbuterol (XOPENEX HFA) 45 MCG/ACT inhaler INHALE 1 PUFF INTO THE LUNGS EVERY 6 HOURS AS NEEDED FOR WHEEZING. 30 each 2   levofloxacin (LEVAQUIN) 500 MG tablet Take 1 tablet (500 mg total) by mouth daily. 7 tablet 0   LORazepam (ATIVAN) 0.5 MG tablet TAKE 0.5-1 TABLETS (0.25-0.5 MG TOTAL) BY MOUTH AS NEEDED FOR ANXIETY (FOR BREAKTHROUGH ANXIETY). 60 tablet 0   omeprazole (PRILOSEC) 20 MG capsule Take 20 mg by mouth daily.     OXYGEN Inhale into the lungs. 2 litre at night     predniSONE (DELTASONE) 50 MG tablet Take 1 tablet (50 mg total) by mouth daily for 5 days. 5 tablet 0   silver sulfADIAZINE (SILVADENE) 1 % cream Apply 1 Application topically daily. 50 g 0   traZODone (DESYREL) 50 MG tablet Take 0.5-1 tablets (25-50 mg total) by mouth at bedtime as needed. for sleep 90 tablet 1   [Paused] azithromycin (ZITHROMAX) 250 MG tablet Take 250 mg by mouth daily. (Patient not taking: Reported on 01/06/2024)     [Paused] predniSONE (DELTASONE) 10 MG tablet Take 10 mg by mouth daily. (Patient not taking: Reported on 01/06/2024)     No current facility-administered medications for this visit.    VITAL SIGNS: BP 128/64 (BP Location: Right Arm, Patient Position: Sitting)   Pulse 93   Temp (!) 96.9 F (36.1 C) (Tympanic)   Resp 18   Wt 162 lb 1.6 oz (73.5 kg)   SpO2 96%   BMI 29.65 kg/m  Filed Weights   01/06/24 1300  Weight: 162 lb 1.6 oz (73.5 kg)    Estimated body mass index is 29.65 kg/m as calculated from the following:   Height as of 01/04/24: 5\' 2"  (1.575 m).   Weight as of this encounter: 162 lb 1.6 oz (73.5 kg).  LABS: CBC:    Component Value Date/Time   WBC 14.0 (H)  12/19/2023 0856   WBC 10.6 (H) 10/06/2023 1132   WBC 16.7 (H) 07/14/2021 0515   HGB 12.7 12/19/2023 0856   HCT 38.9 12/19/2023 0856   PLT 320 12/19/2023 0856   MCV 92 12/19/2023 0856   MCV 90 02/23/2014 1055   NEUTROABS 9.7 (H) 12/19/2023 0856   NEUTROABS 5.4 02/23/2014 1055   LYMPHSABS 3.0 12/19/2023 0856   LYMPHSABS 3.5 02/23/2014 1055   MONOABS 0.1 07/14/2021 0515   MONOABS 1.1 (H) 02/23/2014 1055   EOSABS 0.3 12/19/2023 0856   EOSABS 0.2 02/23/2014 1055   BASOSABS 0.1 12/19/2023 0856   BASOSABS 0.1 02/23/2014 1055   Comprehensive Metabolic Panel:    Component Value Date/Time   NA 144 12/19/2023 0854   NA 139 08/11/2013 0924   K 4.1 12/19/2023 0854   K 4.3 08/11/2013 0924   CL 108 (H) 12/19/2023 0854   CL 107 08/11/2013 0924   CO2 19 (L) 12/19/2023 0854   CO2 26 08/11/2013 0924   BUN 10  12/19/2023 0854   BUN 12 08/11/2013 0924   CREATININE 0.81 12/19/2023 0854   CREATININE 0.87 08/11/2013 0924   GLUCOSE 84 12/19/2023 0854   GLUCOSE 146 (H) 07/14/2021 0515   GLUCOSE 89 08/11/2013 0924   CALCIUM 10.0 12/19/2023 0854   CALCIUM 9.6 08/11/2013 0924   AST 19 12/19/2023 0854   AST 38 (H) 08/11/2013 0924   ALT 21 12/19/2023 0854   ALT 47 08/11/2013 0924   ALKPHOS 69 12/19/2023 0854   ALKPHOS 101 08/11/2013 0924   BILITOT <0.2 12/19/2023 0854   BILITOT 0.3 08/11/2013 0924   PROT 6.8 12/19/2023 0854   PROT 8.1 08/11/2013 0924   ALBUMIN 4.1 12/19/2023 0854   ALBUMIN 4.0 08/11/2013 0924    RADIOGRAPHIC STUDIES: DG Chest 2 View Result Date: 01/04/2024 CLINICAL DATA:  Wheezing EXAM: CHEST - 2 VIEW COMPARISON:  Chest x-ray 05/13/2022 FINDINGS: There are minimal patchy opacities in the lingula. The lungs are otherwise clear. There is no pleural effusion or pneumothorax. The cardiomediastinal silhouette is within normal limits. No acute fractures are seen. IMPRESSION: Minimal patchy opacities in the lingula, atelectasis versus pneumonia. Electronically Signed   By: Darliss Cheney M.D.   On: 01/04/2024 02:01    PERFORMANCE STATUS (ECOG) : 1 - Symptomatic but completely ambulatory  Review of Systems Unless otherwise noted, a complete review of systems is negative.  Physical Exam General: NAD, nontoxic appearing Cardiovascular: regular rate and rhythm Pulmonary: Coarse anterior/posterior fields Abdomen: soft, nontender, + bowel sounds GU: no suprapubic tenderness Extremities: no edema, no joint deformities Skin: no rashes Neurological: Weakness but otherwise nonfocal  IMPRESSION/PLAN: Stage I breast cancer -status postlumpectomy.  On letrozole.  Has follow-up scheduled next week with Dr. Smith Robert.  Left shoulder pain -likely musculoskeletal and agree with continued physical therapy.  Patient reports improvement in symptoms.  COPD/pneumonia -continue steroids/antibiotics with follow-up by PCP.  Case and plan discussed with Dr. Smith Robert   Patient expressed understanding and was in agreement with this plan. She also understands that She can call clinic at any time with any questions, concerns, or complaints.   Thank you for allowing me to participate in the care of this very pleasant patient.   Time Total: 15 minutes  Visit consisted of counseling and education dealing with the complex and emotionally intense issues of symptom management in the setting of serious illness.Greater than 50%  of this time was spent counseling and coordinating care related to the above assessment and plan.  Signed by: Laurette Schimke, PhD, NP-C

## 2024-01-07 ENCOUNTER — Ambulatory Visit: Payer: Medicare Other | Admitting: Physical Therapy

## 2024-01-12 ENCOUNTER — Ambulatory Visit: Payer: Medicare Other

## 2024-01-14 ENCOUNTER — Encounter: Payer: Self-pay | Admitting: *Deleted

## 2024-01-14 ENCOUNTER — Inpatient Hospital Stay: Payer: Medicare Other | Attending: Oncology

## 2024-01-14 ENCOUNTER — Ambulatory Visit: Payer: Medicare Other

## 2024-01-14 ENCOUNTER — Ambulatory Visit
Admission: RE | Admit: 2024-01-14 | Discharge: 2024-01-14 | Disposition: A | Discharge status: Home / Self Care | Source: Ambulatory Visit | Attending: Oncology | Admitting: Oncology

## 2024-01-14 ENCOUNTER — Encounter: Payer: Self-pay | Admitting: Oncology

## 2024-01-14 ENCOUNTER — Inpatient Hospital Stay: Payer: Medicare Other | Admitting: Oncology

## 2024-01-14 VITALS — BP 127/94 | HR 97 | Temp 97.4°F | Resp 16 | Wt 163.0 lb

## 2024-01-14 DIAGNOSIS — Z8701 Personal history of pneumonia (recurrent): Secondary | ICD-10-CM | POA: Insufficient documentation

## 2024-01-14 DIAGNOSIS — K219 Gastro-esophageal reflux disease without esophagitis: Secondary | ICD-10-CM | POA: Insufficient documentation

## 2024-01-14 DIAGNOSIS — Z79899 Other long term (current) drug therapy: Secondary | ICD-10-CM | POA: Diagnosis not present

## 2024-01-14 DIAGNOSIS — F17210 Nicotine dependence, cigarettes, uncomplicated: Secondary | ICD-10-CM | POA: Insufficient documentation

## 2024-01-14 DIAGNOSIS — Z170 Estrogen receptor positive status [ER+]: Secondary | ICD-10-CM | POA: Insufficient documentation

## 2024-01-14 DIAGNOSIS — R232 Flushing: Secondary | ICD-10-CM | POA: Insufficient documentation

## 2024-01-14 DIAGNOSIS — Z803 Family history of malignant neoplasm of breast: Secondary | ICD-10-CM | POA: Diagnosis not present

## 2024-01-14 DIAGNOSIS — Z853 Personal history of malignant neoplasm of breast: Secondary | ICD-10-CM

## 2024-01-14 DIAGNOSIS — E785 Hyperlipidemia, unspecified: Secondary | ICD-10-CM | POA: Insufficient documentation

## 2024-01-14 DIAGNOSIS — Z7951 Long term (current) use of inhaled steroids: Secondary | ICD-10-CM | POA: Insufficient documentation

## 2024-01-14 DIAGNOSIS — C50912 Malignant neoplasm of unspecified site of left female breast: Secondary | ICD-10-CM | POA: Diagnosis present

## 2024-01-14 DIAGNOSIS — Z08 Encounter for follow-up examination after completed treatment for malignant neoplasm: Secondary | ICD-10-CM

## 2024-01-14 DIAGNOSIS — Z79811 Long term (current) use of aromatase inhibitors: Secondary | ICD-10-CM

## 2024-01-14 DIAGNOSIS — J449 Chronic obstructive pulmonary disease, unspecified: Secondary | ICD-10-CM | POA: Diagnosis not present

## 2024-01-14 DIAGNOSIS — Z7189 Other specified counseling: Secondary | ICD-10-CM

## 2024-01-14 DIAGNOSIS — Z7952 Long term (current) use of systemic steroids: Secondary | ICD-10-CM | POA: Insufficient documentation

## 2024-01-14 LAB — CMP (CANCER CENTER ONLY)
ALT: 22 U/L (ref 0–44)
AST: 26 U/L (ref 15–41)
Albumin: 3.9 g/dL (ref 3.5–5.0)
Alkaline Phosphatase: 53 U/L (ref 38–126)
Anion gap: 11 (ref 5–15)
BUN: 9 mg/dL (ref 8–23)
CO2: 22 mmol/L (ref 22–32)
Calcium: 9.1 mg/dL (ref 8.9–10.3)
Chloride: 98 mmol/L (ref 98–111)
Creatinine: 0.89 mg/dL (ref 0.44–1.00)
GFR, Estimated: >60 mL/min (ref >60)
Glucose, Bld: 95 mg/dL (ref 70–99)
Potassium: 3.9 mmol/L (ref 3.5–5.1)
Sodium: 131 mmol/L — ABNORMAL LOW (ref 135–145)
Total Bilirubin: 0.6 mg/dL (ref 0.0–1.2)
Total Protein: 7.6 g/dL (ref 6.5–8.1)

## 2024-01-14 NOTE — Progress Notes (Addendum)
 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  This was followed by a diagnostic mammogram which showed a 7 x 5 x 6 mm mass at the 12 o'clock position of the left breast 5 cm from the nipple.  No suspicious left axillary lymph nodes.  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 Family History significant for paternal aunt with breast cancer..  Menarche at the age of 83.  G4, P2.  No prior abnormal breast biopsies. MRI bilateral breast showed 1.2 cm enhancing mass in the left breast and 4 mm indeterminate mass in the inferior left breast.  The 4 mm mass was biopsied and was negative for malignancy            ```   Lumpectomy pathology from 08/06/2023 showed 0.9 cm invasive carcinoma with mixed lobular and ductal features with negative margins.  No evidence of lymphovascular invasion.  Grade 2.  1 sentinel lymph node negative for malignancy.  Oncotype testing showed intermediate risk for of 14 and given her age she does not require any adjuvant chemotherapy for this score.  She completed adjuvant radiation therapy and started taking letrozole  in January  2025.  Interval history-patient has been feeling fatigued congested and reports some exertional shortness of breath over the last few weeks.  At baseline she does use home oxygen  at night 2 to 3 L.  She was treated for possible pneumonia 2 weeks ago and completed 1 week of Levaquin .  Symptoms are mildly better although patient still feels congested and is coughing and producing productive sputum.  No fever.  She does report hot flashes with letrozole  which are overall self-limited.  Denies other complaints on letrozole   ECOG PS- 2 Pain scale- 3   Review of systems- Review of Systems  Constitutional:  Positive for malaise/fatigue. Negative for chills, fever and weight loss.  HENT:  Negative for congestion, ear discharge and nosebleeds.   Eyes:  Negative for blurred vision.  Respiratory:  Positive for cough and shortness of breath. Negative for hemoptysis, sputum production and wheezing.   Cardiovascular:  Negative for chest pain, palpitations, orthopnea and claudication.  Gastrointestinal:  Negative for abdominal pain, blood in stool, constipation, diarrhea, heartburn, melena, nausea and vomiting.  Genitourinary:  Negative for dysuria, flank pain, frequency, hematuria and urgency.  Musculoskeletal:  Negative for back pain, joint pain and myalgias.  Skin:  Negative for rash.  Neurological:  Negative for dizziness, tingling, focal weakness, seizures, weakness and headaches.  Endo/Heme/Allergies:  Does not bruise/bleed easily.  Psychiatric/Behavioral:  Negative for depression and suicidal ideas. The patient does not have insomnia.       Allergies  Allergen Reactions   Penicillins Nausea And Vomiting and Other (  See Comments)    Has patient had a PCN reaction causing immediate rash, facial/tongue/throat swelling, SOB or lightheadedness with hypotension: No Has patient had a PCN reaction causing severe rash involving mucus membranes or skin necrosis: No Has patient had a PCN reaction that  required hospitalization: No Has patient had a PCN reaction occurring within the last 10 years: Yes If all of the above answers are NO, then may proceed with Cephalosporin use.    Shellfish Allergy Nausea And Vomiting   Sulfa Antibiotics Rash and Other (See Comments)    Other reaction(s): Unknown   Clarithromycin Rash and Nausea Only    rash   Amoxicillin-Pot Clavulanate Nausea And Vomiting   Iodinated Contrast Media Other (See Comments)    acute renal insufficiency acute renal insufficiency   Atorvastatin  Rash   Macrobid [Nitrofurantoin] Rash     Past Medical History:  Diagnosis Date   Cancer (HCC)    COPD (chronic obstructive pulmonary disease) (HCC)    Diverticula of colon    GERD (gastroesophageal reflux disease)    Hyperlipidemia    Renal insufficiency    2009 or 2010     Past Surgical History:  Procedure Laterality Date   APPENDECTOMY     AXILLARY SENTINEL NODE BIOPSY Left 08/06/2023   Procedure: AXILLARY SENTINEL NODE BIOPSY;  Surgeon: Lane Shope, MD;  Location: [facility] ORS;  Service: General;  Laterality: Left;   BREAST BIOPSY Left 07/02/2023   US  LT BREAST BX W LOC DEV 1ST LESION IMG BX SPEC US  GUIDE 07/02/2023 [facility]   BREAST LUMPECTOMY WITH RADIOFREQUENCY TAG IDENTIFICATION Left 08/06/2023   Procedure: BREAST LUMPECTOMY WITH RADIOFREQUENCY TAG IDENTIFICATION;  Surgeon: Lane Shope, MD;  Location: [facility] ORS;  Service: General;  Laterality: Left;   CESAREAN SECTION     ECTOPIC PREGNANCY SURGERY      Social History   Socioeconomic History   Marital status: Divorced    Spouse name: Not on file   Number of children: Not on file   Years of education: Not on file   Highest education level: 12th grade  Occupational History   Not on file  Tobacco Use   Smoking status: Every Day    Current packs/day: 0.50    Average packs/day: 0.5 packs/day for 50.0 years (25.0 ttl pk-yrs)    Types: Cigarettes    Passive exposure: Past   Smokeless tobacco:  Never  Vaping Use   Vaping status: Never Used  Substance and Sexual Activity   Alcohol use: Yes    Comment: social   Drug use: No   Sexual activity: Not Currently  Other Topics Concern   Not on file  Social History Narrative   Not on file   Social Drivers of Health   Financial Resource Strain: Medium Risk (12/17/2023)   Overall Financial Resource Strain (CARDIA)    Difficulty of Paying Living Expenses: Somewhat hard  Food Insecurity: No Food Insecurity (12/17/2023)   Hunger Vital Sign    Worried About Running Out of Food in the Last Year: Never true    Ran Out of Food in the Last Year: Never true  Transportation Needs: No Transportation Needs (12/17/2023)   PRAPARE - Administrator, Civil Service (Medical): No    Lack of Transportation (Non-Medical): No  Physical Activity: Inactive (12/17/2023)   Exercise Vital Sign    Days of Exercise per Week: 0 days    Minutes of Exercise per Session: 0 min  Stress: No Stress Concern Present (12/17/2023)  [employer] of Occupational Health - Occupational Stress Questionnaire    Feeling of Stress : Only a little  Social Connections: Socially Isolated (12/17/2023)   Social Connection and Isolation Panel [NHANES]    Frequency of Communication with Friends and Family: More than three times a week    Frequency of Social Gatherings with Friends and Family: Once a week    Attends Religious Services: Never    Database administrator or Organizations: No    Attends Banker Meetings: Never    Marital Status: Divorced  Catering manager Violence: Not At Risk (11/20/2023)   Humiliation, Afraid, Rape, and Kick questionnaire    Fear of Current or Ex-Partner: No    Emotionally Abused: No    Physically Abused: No    Sexually Abused: No    Family History  Problem Relation Age of Onset   Breast cancer Paternal Aunt    Heart attack Mother      Current Outpatient Medications:    acetaminophen  (TYLENOL ) 500 MG tablet, Take 1,000  mg by mouth every 6 (six) hours as needed., Disp: , Rfl:    budesonide  (PULMICORT ) 0.5 MG/2ML nebulizer solution, Take 2 mLs by nebulization 2 (two) times daily., Disp: , Rfl:    busPIRone  (BUSPAR ) 10 MG tablet, TAKE 1/2 TO 1 TABLET UP TO THREE TIMES DAILY AS NEEDED FOR ANXIETY, Disp: 270 tablet, Rfl: 2   formoterol  (PERFOROMIST ) 20 MCG/2ML nebulizer solution, INHALE 2 ML (20 MCG TOTAL) BY NEBULIZATION TWO (2) TIMES A DAY., Disp: , Rfl:    ipratropium (ATROVENT ) 0.02 % nebulizer solution, Take 2.5 mLs (0.5 mg total) by nebulization 4 (four) times daily., Disp: 75 mL, Rfl: 3   letrozole  (FEMARA ) 2.5 MG tablet, Take 1 tablet (2.5 mg total) by mouth daily. Start after radiation is complete, Disp: 30 tablet, Rfl: 6   levalbuterol  (XOPENEX  HFA) 45 MCG/ACT inhaler, INHALE 1 PUFF INTO THE LUNGS EVERY 6 HOURS AS NEEDED FOR WHEEZING., Disp: 30 each, Rfl: 2   levofloxacin  (LEVAQUIN ) 500 MG tablet, Take 1 tablet (500 mg total) by mouth daily., Disp: 7 tablet, Rfl: 0   LORazepam  (ATIVAN ) 0.5 MG tablet, TAKE 0.5-1 TABLETS (0.25-0.5 MG TOTAL) BY MOUTH AS NEEDED FOR ANXIETY (FOR BREAKTHROUGH ANXIETY)., Disp: 60 tablet, Rfl: 0   omeprazole  (PRILOSEC) 20 MG capsule, Take 20 mg by mouth daily., Disp: , Rfl:    OXYGEN , Inhale into the lungs. 2 litre at night, Disp: , Rfl:    silver  sulfADIAZINE  (SILVADENE ) 1 % cream, Apply 1 Application topically daily., Disp: 50 g, Rfl: 0   traZODone  (DESYREL ) 50 MG tablet, Take 0.5-1 tablets (25-50 mg total) by mouth at bedtime as needed. for sleep, Disp: 90 tablet, Rfl: 1   azithromycin  (ZITHROMAX ) 250 MG tablet, Take 250 mg by mouth daily. (Patient not taking: Reported on 01/14/2024), Disp: , Rfl:    predniSONE  (DELTASONE ) 10 MG tablet, Take 10 mg by mouth daily. (Patient not taking: Reported on 01/14/2024), Disp: , Rfl:   Physical exam:  Vitals:   01/14/24 1120  BP: (!) 127/94  Pulse: 97  Resp: 16  Temp: (!) 97.4 F (36.3 C)  TempSrc: Tympanic  SpO2: 99%  Weight: 163 lb  (73.9 kg)   Physical Exam Cardiovascular:     Rate and Rhythm: Normal rate and regular rhythm.     Heart sounds: Normal heart sounds.  Pulmonary:     Effort: Pulmonary effort is normal.     Comments: Scattered b/l rhonchi Skin:  General: Skin is warm and dry.  Neurological:     Mental Status: She is alert and oriented to person, place, and time.         Latest Ref Rng & Units 01/14/2024   11:09 AM  CMP  Glucose 70 - 99 mg/dL 95   BUN 8 - 23 mg/dL 9   Creatinine 9.55 - 8.99 mg/dL 9.10   Sodium 864 - 854 mmol/L 131   Potassium 3.5 - 5.1 mmol/L 3.9   Chloride 98 - 111 mmol/L 98   CO2 22 - 32 mmol/L 22   Calcium  8.9 - 10.3 mg/dL 9.1   Total Protein 6.5 - 8.1 g/dL 7.6   Total Bilirubin 0.0 - 1.2 mg/dL 0.6   Alkaline Phos 38 - 126 U/L 53   AST 15 - 41 U/L 26   ALT 0 - 44 U/L 22       Latest Ref Rng & Units 12/19/2023    8:56 AM  CBC  WBC 3.4 - 10.8 x10E3/uL 14.0   Hemoglobin 11.1 - 15.9 g/dL 87.2   Hematocrit 65.9 - 46.6 % 38.9   Platelets 150 - 450 x10E3/uL 320     No images are attached to the encounter.  DG Chest 2 View Result Date: 01/04/2024 CLINICAL DATA:  Wheezing EXAM: CHEST - 2 VIEW COMPARISON:  Chest x-ray 05/13/2022 FINDINGS: There are minimal patchy opacities in the lingula. The lungs are otherwise clear. There is no pleural effusion or pneumothorax. The cardiomediastinal silhouette is within normal limits. No acute fractures are seen. IMPRESSION: Minimal patchy opacities in the lingula, atelectasis versus pneumonia. Electronically Signed   By: Greig Pique M.D.   On: 01/04/2024 02:01     Assessment and plan- Patient is a 73 y.o. female  with pathological prognostic stage Ia invasive carcinoma of the left breast with mixed ductal and lobular features pT1b N0 M0 ER/PR positive HER2 negative. She is here for routine f/u of breast cancer  Patient has completed adjuvant radiation therapy and started taking letrozole .  She reports some self-limited hot flashes but  is otherwise tolerating letrozole  well.  We will see how she does over the next few months and if the hot flashes are not tolerable we will consider switching her to an alternative AI.  I will see her back in 4 months no labs.  Patient reports ongoing fatigue and exertional shortness of breath as well as cough and sputum production.  Respiratory viral testing was -2 weeks ago.  Chest x-ray showed possible lingular pneumonia for which she received a week of Levaquin .  Given her persistent symptoms as well as rhonchi on physical exam I have repeated herChest x-ray today.  Official results are not bad but to my review it looks similar to her x-ray from 12/23/2023 no worse.  I am therefore holding off on giving her any further antibiotics.  She will need to reach out to her pulmonary or PCP if her symptoms worsen.  My office will call her after her chest x-ray gets reported   Visit Diagnosis 1. Encounter for follow-up surveillance of breast cancer   2. History of pneumonia   3. Use of letrozole  (Femara )   4. High risk medication use      Dr. Annah Skene, MD, MPH [facility] at [facility] [phone] 01/14/2024 1:06 PM

## 2024-01-19 ENCOUNTER — Ambulatory Visit: Payer: Medicare Other

## 2024-01-19 ENCOUNTER — Ambulatory Visit: Attending: Oncology

## 2024-01-19 DIAGNOSIS — M25512 Pain in left shoulder: Secondary | ICD-10-CM | POA: Diagnosis present

## 2024-01-19 DIAGNOSIS — M25612 Stiffness of left shoulder, not elsewhere classified: Secondary | ICD-10-CM | POA: Insufficient documentation

## 2024-01-19 DIAGNOSIS — R293 Abnormal posture: Secondary | ICD-10-CM | POA: Diagnosis present

## 2024-01-19 NOTE — Therapy (Signed)
 OUTPATIENT PHYSICAL THERAPY TREATMENT   Patient Name: Stephanie Hess MRN: [id number] DOB:12/20/1950, 73 y.o., female Today's Date: 01/19/2024  END OF SESSION:  PT End of Session - 01/19/24 1552     Visit Number 3    Number of Visits 24    Date for PT Re-Evaluation 03/28/24    Authorization Type [employer] Medicare    Authorization Time Period 12/30/23-03/28/24    Progress Note Due on Visit 10    PT Start Time 1550    PT Stop Time 1630    PT Time Calculation (min) 40 min    Activity Tolerance Patient tolerated treatment well;Patient limited by pain    Behavior During Therapy [facility] for tasks assessed/performed              Past Medical History:  Diagnosis Date   Cancer (HCC)    COPD (chronic obstructive pulmonary disease) (HCC)    Diverticula of colon    GERD (gastroesophageal reflux disease)    Hyperlipidemia    Renal insufficiency    2009 or 2010   Past Surgical History:  Procedure Laterality Date   APPENDECTOMY     AXILLARY SENTINEL NODE BIOPSY Left 08/06/2023   Procedure: AXILLARY SENTINEL NODE BIOPSY;  Surgeon: Campbell Lerner, MD;  Location: [facility] OR[facility];  Service: General;  Laterality: Left;   BREAST BIOPSY Left 07/02/2023   [place] LT BREAST BX W LOC DEV 1ST LESION IMG BX SPEC US GUIDE 07/02/2023 [facility]   BREAST LUMPECTOMY WITH RADIOFREQUENCY TAG IDENTIFICATION Left 08/06/2023   Procedure: BREAST LUMPECTOMY WITH RADIOFREQUENCY TAG IDENTIFICATION;  Surgeon: Campbell Lerner, MD;  Location: [facility] OR[facility];  Service: General;  Laterality: Left;   CESAREAN SECTION     ECTOPIC PREGNANCY SURGERY     Patient Active Problem List   Diagnosis Date Noted   Statin intolerance 12/18/2023   Osteopenia of neck of left femur 12/18/2023   Malignant neoplasm of upper-outer quadrant of left breast in female, 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/2023   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) 07/08/2023   Aortic atherosclerosis (HCC) 05/23/2023   BMI 30.0-30.9,adult  03/07/2022   Generalized anxiety disorder 08/19/2021   Chronic obstructive pulmonary disease (HCC) 07/13/2021   Nicotine abuse 03/03/2018   Mild intermittent asthma without complication 03/03/2018   Pulmonary fibrosis (HCC) 03/03/2018   GERD (gastroesophageal reflux disease) 03/03/2018   Hyperlipidemia, mixed 05/15/2017   Vitamin D deficiency 05/15/2017    PCP: Alyson Reedy, FNP  REFERRING PROVIDER: Owens Shark, MD (oncologist)   REFERRING DIAG: Left shoulder pain   THERAPY DIAG:  Acute pain of left shoulder  Stiffness of left shoulder, not elsewhere classified  Abnormal posture  Rationale for Evaluation and Treatment: Rehabilitation   ONSET DATE: January 2025  SUBJECTIVE:  SUBJECTIVE STATEMENT: Pt referred here by oncology for post radiation shoulder pain. Screened by OT at  cancer center who believes PT would be of benefit.   PERTINENT HISTORY: Stephanie Hess is a 72yo right handed female who reports having pain in her left shoulder, a lot of tightness, and paresthesias in left lateral upper arm. There is also some tightness in left upper trap to a lesser degree of pain. This started in January. Pt reports having Left axillary lymph node resection September and completed left radiation in December. MD reports her symptoms are attributed to nerve regeneration post radiation. OT reports her left shoulder muscles are angry.   Pt reports able to manage limb comfort overnight, trouble falling asleep, but not being awakened. Pt is pain free first thing in morning x1 hour, then pain starts and progressively worsens as the day goes on.  -5/10 pain current pain(bad with going up stairs, sitting on commode) -Worst pain 8/10 (toward end of day) -pain free in the morning (best pain) -other factors: Pt  really notices the pain when she nears the top of her stairs (not using this arm for railing use), also notices it more when sitting on the commode (suspect related to doffing LBD management as this is a position of aggravation.)  -pt able to manage her pain overnight, feels good in morning -taking tylenol, ibuprofen (alternating) twice daily AM /PM for pain  Pt denies any prior similar episodes, denies neck pain, denies headaches; pt denies any recent falls, trauma that would explain any occult fracture, dislocation.   PAIN:  Are you having pain? Yes 5/10 today    Pain at evlauation: Yellow=paresthesias; Red= tightness/sore pain   PRECAUTIONS: LUE restricted limb (s/p lymph node resection)   WEIGHT BEARING RESTRICTIONS: No  FALLS:  Has patient fallen in last 6 months? No  PLOF: Unrestricted Left arm use; full independent in ADL/IADL  PATIENT GOALS: Better pain control, unrestricted arm use   NEXT MD VISIT: 01/14/24 Dr. Smith Robert   OBJECTIVE:  Note: Objective measures were completed at Evaluation unless otherwise noted.  DIAGNOSTIC FINDINGS:  DXA 09/26/23: ASSESSMENT: The probability of a major osteoporotic fracture is 15.7% within the next ten years.   The probability of a hip fracture is 2.8% within the next ten years.  PATIENT SURVEYS:  QUICK DASH: 36.4 points   SENSATION: Not tested; pt reports upper brachial paresthesias   POSTURE: WNL  UPPER LIMB MOBILITY:  -significant intolerance to open chair active or passive left shoulder flexion >50 degrees -Left 'sling' posturing of arm is also poorly tolerated -defer formal goniometry to after adequate pain control has been achieved.  UPPER LIMB STRENGTH: -defer formal strength assessment to after adequate pain control has been achieved.  CERVICAL SPINE SCREENING:  -head position does not affect symptoms in arm -cervical ROM appears normal for age -Cervical A/ROM is not painfully restricted.    JOINT MOBILITY TESTING:   -Defer to after better pain control is established   PALPATION:  -Tolerates mild to moderate pressure; humeral head feels appropriately positioned within the glenoid fossa.  TREATMENT DATE 01/19/24   Subjective: Patient reports she has recovered from her pneumonia.  She is very pleased that her L shoulder is feeling better and she is using her arm without any difficulty at home now.  She is not experiencing shoulder pain.   Objective: L shoulder pain 0/10 current, best/worse 0/10 this week  Therapeutic Exercises: Scapular retractions x10 Supine shoulder flexion x10 Wall slide abd x 10 HBB x10  AROM reassessment Normal cervical spine AROM and does not recreate shoulder sx L shoulder flexion 160 (R 165 deg) L shoulder abd 160 (R 165 deg) HBB thumb to inf angle of scapula b/l Able to reach [facility] normally bilaterally (-) L ULTT test for sx, symmetrical ROM compared to R, no sx with distant segment movement  MMT reassessment 5/5 L shoulder flexion, abd, IR, ER, elbow flexion, elbow extension  Goal reassessment (see below)   PATIENT EDUCATION: Self care/home instructions Education details: HEP (see below), and how to monitor for shoulder ROM symmetry; instructed to return to PT if she begins to lose ROM or pain increases again Person educated: Patient Education method: collaborative learning, deliberate practice, positive reinforcement, explicit instruction, establish rules. Education comprehension: verbalized understanding  HOME EXERCISE PROGRAM: Instructed pt on how to perform this HEP and what signs to look for that could indicate regression vs improvement of L shoulder AROM Access Code: [email or web address] URL: [email or web address] Date: 01/19/2024 Prepared by: Max Fickle  Exercises - Standing Shoulder Abduction Slides at Wall  - 1 x daily -  10 reps - Supine Shoulder Flexion AAROM  - 1 x daily - 1 minute hold - Supine Chest Stretch with Elbows Bent  - 1 x daily - 1 minute hold - Seated Lifting Hands Behind Back  - 1 x daily - 1 minute hold  ASSESSMENT:  CLINICAL IMPRESSION:  Patient returns for follow up PT visit after recovering from a course of pneumonia.  Re-assessment of L shoulder mobility and strength was performed today.  Pt's shoulder ROM has significantly improved; she lacks last 5 degrees of flexion and abduction AROM compared to R; but symmetrical ER and IR and all her L shoulder AROM is within functional ROM and is pain free now.  (-) L UE upper limb tension noted today.  And she has 5/5 L shoulder strength and all strength testing is pain free.  She is very pleased with the improvement in her L shoulder pain and her ability to use her L arm now.  She does not feel limited in her daily activities anymore and would like to d/c PT at this time and continue with her HEP independently.   From Eval:  Stephanie Hess is a 72yoF presenting to OPPT with acute onset Left shoulder pain/stiffness and left upper arm paresthesias. Referring provider indicates some role of post radiation neuropathic etiology. Screening today inconsistent with focal radicular pathology; pain response to basic movements and postures is uncommon and difficult to predict, unable to exhaustively perform orthopedic tests and measures. Today treatment largely aimed at finding non-pharmacological means of using movement to help with symptoms management. Pt needs extensive cues to maintain interventions at a 'tolerable degree' as she naturally attempts to be pain averse, however Thereasa Parkin explains the need to perform ROM interventions regularly and in high volumes, hence, incompatible with significant pain aggravation. Discussed with pt that a higher degree of analgesia would allow for better participation in PT services. Will reach out to referring provider about this.  Addiitonal screening and examination to follow as  symptoms permit. Pt will benefit from skilled PT intervention to achieve ST and LT goals of care.   OBJECTIVE IMPAIRMENTS: decreased activity tolerance, increased muscle spasms, impaired flexibility, impaired UE functional use, and pain.   ACTIVITY LIMITATIONS: carrying, lifting, stairs, bathing, toileting, dressing, and reach over head  PARTICIPATION LIMITATIONS: meal prep, cleaning, laundry, and yard work  PERSONAL FACTORS: Education, Fitness, and Time since onset of injury/illness/exacerbation are also affecting patient's functional outcome.   REHAB POTENTIAL: Good  CLINICAL DECISION MAKING: Evolving/moderate complexity  EVALUATION COMPLEXITY: Moderate   GOALS: Goals reviewed with patient? No  SHORT TERM GOALS: Target date: 01/27/24  Improve Quick Dash Score by >12% to indicate reduced disability in ADL/household activity.  Baseline: 36.4 Goal status: INITIAL  2.  Pt to report 2-3 non medication tool to manage arm pain at home.  Baseline: Able to use rest at eval;  Goal status: MET 01/19/24  3.  Pt to tolerate LUE MMT and ROM testing without pain exacerbation >2/10 points.  Baseline: eval unable to tolerate Goal status: MET 01/19/24  LONG TERM GOALS: Target date: 03/28/24  Quick DASH improvement >16% for reduced disability in ADL/household actiivty.  Baseline:  Goal status: In progress 01/19/24  2.  Pt to demonstrate 5/5 strength in Left shoulder flexion/ABDCT/Extension/IR/ER.  Baseline: eval: not tolerant to testing Goal status: MET 01/19/24  3.  Left shoulder ROM in flexion, ABDCT, and ER within 85% of RUE.  Baseline: <50% of RUE at eval.  Goal status: MET 01/19/24  4.  Independent performance of DC HEP for conitnued improvement in strength and ROM post DC.  Baseline: partial performance of beginner HEP.  Goal status: MET 01/19/24  PLAN:  PT FREQUENCY: 1-2x/week  PT DURATION: 3 months   PLANNED INTERVENTIONS:  97110-Therapeutic exercises, 97530- Therapeutic activity, 97112- Neuromuscular re-education, 97535- Self Care, 47829- Manual therapy, [id number]- Electrical stimulation (unattended), [phone]- Electrical stimulation (manual), Patient/Family education, Taping, Dry Needling, Joint mobilization, Joint manipulation, Spinal manipulation, Spinal mobilization, and Moist heat  PLAN FOR NEXT SESSION:  Discussed options for continuing PT for strength/endurance and remaining ROM deficits; and also discussed option for how to continue with HEP; Pt notes she is pleased with her progress and would like to continue working on HEP independently for now and DC PT.     Max Fickle, PT, DPT, OCS   Physical Therapist - [facility] 01/19/2024, 5:37 PM

## 2024-01-21 ENCOUNTER — Ambulatory Visit: Payer: Medicare Other

## 2024-01-26 ENCOUNTER — Ambulatory Visit: Payer: Medicare Other

## 2024-01-28 ENCOUNTER — Ambulatory Visit
Admission: RE | Admit: 2024-01-28 | Discharge: 2024-01-28 | Disposition: A | Discharge status: Home / Self Care | Payer: Medicare Other | Source: Ambulatory Visit | Attending: Surgery | Admitting: Surgery

## 2024-01-28 DIAGNOSIS — Z853 Personal history of malignant neoplasm of breast: Secondary | ICD-10-CM | POA: Diagnosis not present

## 2024-01-28 DIAGNOSIS — C50912 Malignant neoplasm of unspecified site of left female breast: Secondary | ICD-10-CM | POA: Diagnosis present

## 2024-01-28 DIAGNOSIS — Z08 Encounter for follow-up examination after completed treatment for malignant neoplasm: Secondary | ICD-10-CM | POA: Diagnosis not present

## 2024-01-28 HISTORY — DX: Personal history of irradiation: Z92.3

## 2024-01-28 HISTORY — DX: Malignant neoplasm of unspecified site of unspecified female breast: C50.919

## 2024-01-29 NOTE — Progress Notes (Signed)
 [facility] POST-OP OFFICE VISIT  02/05/2024  HPI: Stephanie Hess is a 73 y.o. female is s/p Scout tag to left breast lumpectomy with sentinel lymph node biopsy, from August 06, 2023.   She has little complaint regarding postoperative pain or discomfort.  There is some lateral shoulder outer upper arm shooting pains.  She still has some hot flashes from her letrozole.  But otherwise seems to be tolerating it okay.  Complains of fatigue/weakness.   ____________________________________ REPORT OF SURGICAL PATHOLOGY Date: August 06, 2023  Accession #: [id number] Patient Name: ALEXES, MENCHACA Visit # : [id number]  MRN: [id number] Physician: Campbell Lerner DOB/Age Nov 03, 1951 (Age: 69) Gender: F Collected Date: 08/06/2023 Received Date: 08/06/2023  FINAL DIAGNOSIS       1. Lymph node, sentinel, biopsy, #1 Left axilla :      ONE LYMPH NODE, NEGATIVE FOR METASTATIC CARCINOMA (0/1).       2. Breast, lumpectomy, Left upper :      INVASIVE CARCINOMA WITH MIXED LOBULAR AND DUCTAL FEATURES, 0.9 CM, GRADE 2      DUCTAL CARCINOMA IN SITU:  CRIBRIFORM TYPE, INTERMEDIATE GRADE      MARGINS, INVASIVE:  NEGATIVE      CLOSEST, INVASIVE:  2.5 MM FROM ANTERIOR MARGIN      MARGINS, DCIS:  NEGATIVE      CLOSEST, DCIS:  5 MM FROM ANTERIOR AND POSTERIOR MARGINS      LYMPHOVASCULAR INVASION:  NOT IDENTIFIED      PROGNOSTIC MARKERS:      ER, PR, HER2, KI-67 WILL BE REPORTED IN AN ADDENDUM      OTHER: SCLEROSING ADENOSIS      SEE ONCOLOGY TABLE AND NOTE      ONCOLOGY TABLE:      INVASIVE CARCINOMA OF THE BREAST:  RESECTION      PROCEDURE: LUMPECTOMY      SPECIMEN LATERALITY: LEFT UPPER      HISTOLOGIC TYPE:INVASIVE CARCINOMA WITH MIXED LOBULAR AND DUCTAL FEATURES      HISTOLOGIC GRADE:      GLANDULAR (ACINAR)/TUBULAR DIFFERENTIATION: 3      NUCLEAR PLEOMORPHISM: 2      MITOTIC RATE: 1      OVERALL GRADE: 6      TUMOR SIZE: 0.9 CM IN MAXIMAL DIMENSION      DUCTAL CARCINOMA  IN SITU: CRIBRIFORM TYPES, INTERMEDIATE GRADE      LYMPHATIC AND/OR VASCULAR INVASION:  NOT IDENTIFIED      TREATMENT EFFECT IN THE BREAST: NO KNOWN PRESURGICAL THERAPY      MARGINS: ALL MARGINS NEGATIVE FOR INVASIVE CARCINOMA      DISTANCE FROM CLOSEST MARGIN (MM): 2.5 MM FROM ANTERIOR MARGIN      SPECIFY CLOSEST MARGIN (REQUIRED ONLY IF <10MM): ANTERIOR MARGIN      DCIS MARGINS: UNINVOLVED BY DCIS      DISTANCE FROM CLOSEST MARGIN (MM): 5 MM FROM ANTERIOR AND POSTERIOR MARGINS      SPECIFY CLOSEST MARGIN (REQUIRED ONLY IF <10MM): ANTERIOR AND POSTERIOR MARGINS      REGIONAL LYMPH NODES:      NUMBER OF LYMPH NODES EXAMINED: 1      NUMBER OF SENTINEL NODES EXAMINED: 1      NUMBER OF LYMPH NODES WITH MACROMETASTASES (>2 MM): 0      NUMBER OF LYMPH NODES WITH MICROMETASTASES: 0      NUMBER OF LYMPH NODES WITH ISOLATED TUMOR CELLS (=0.2 MM OR =200 CELLS): 0      SIZE OF LARGEST METASTATIC  DEPOSIT (MM): NA      EXTRANODAL EXTENSION: NA      DISTANT METASTASIS:      DISTANT SITE(S) INVOLVED: NOT APPLICABLE      BREAST BIOMARKER TESTING PERFORMED ON PREVIOUS BIOPSY:      TESTING PERFORMED ON CASE NUMBER: NONE      ESTROGEN RECEPTOR, PROGESTERONE RECEPTOR, HER2, KI-67, WILL BE REPORTED IN AN      ADDENDUM      PATHOLOGIC STAGE CLASSIFICATION (PTNM, AJCC 8TH EDITION): PT1B, PN0      REPRESENTATIVE TUMOR BLOCK: 2/C      COMMENT(S): SEE NOTE      (V4.5.0.0)       Diagnosis Note : The lesion is composed of predominant lobular carcinoma with      small component of ductal carcinoma at the periphery of the lesion.Ductal      carcinoma in situ is also seen within the lesion.Immunohistochemical stain for      E-Cadherin shows loss of expression in the lobular carcinoma component while it      is intact in the ductal carcinoma.Myoepithelial markers (Calponin, SMM-1 and      p63) show loss of staining in the invasive ductal carcinoma.The overall findings      are in keeping with an invasive  carcinoma with mixed lobular and ductal      features.CK AE1/AE3 performed on 1/A does not show evidence of metastatic      carcinoma in the lymph node.      Controls worked appropriately.  ADDENDUM Breast, lumpectomy, Left upper PROGNOSTIC INDICATORS  Results: IMMUNOHISTOCHEMICAL AND MORPHOMETRIC ANALYSIS PERFORMED MANUALLY The tumor cells are equivocal for Her2 (2+). Her2 by FISH will be performed and the results reported separately. Estrogen Receptor:  100%, POSITIVE, STRONG STAINING INTENSITY Progesterone Receptor:  60%, POSITIVE, STRONG STAINING INTENSITY Proliferation Marker Ki67:  20% REFERENCE RANGE ESTROGEN RECEPTOR NEGATIVE     0% POSITIVE       =>1% REFERENCE RANGE PROGESTERONE RECEPTOR NEGATIVE     0% POSITIVE        =>1% All controls stained appropriately Arville Care, [place], Sports administrator, International aid/development worker ( Signed 10 02 2024) 2C-Breast,lumpectomy FLUORESCENCE IN-SITU HYBRIDIZATION  Results: GROUP 5:   HER2 **NEGATIVE**  Equivocal form of amplification of the HER2 gene was detected in the IHC 2+ tissue sample received from this individual.  HER2 FISH was performed by a technologist and cell imaging and analysis on the [employer]. RATIO OF HER2/CEN17 SIGNALS                          1.15 AVERAGE HER2 COPY NUMBER PER CELL      1.09 The ratio of HER2/CEN 17 is within the range < 2.0 of HER2/CEN 17 and a copy number of HER2 signals per cell is <4.0.    Vital signs: BP 115/74   Pulse 98   Ht 5\' 2"  (1.575 m)   Wt 160 lb (72.6 kg)   SpO2 99%   BMI 29.26 kg/m    Physical Exam: Constitutional: She appears well, at her baseline Skin: Her left circumareolar is clean and healed nicely.  Both breasts are quite soft and supple with no suspicious nodularity or asymmetric density or mass or skin change.  It appears her skin has recovered quite well from her radiation changes.  Her left axillary incision is clean dry and intact.  Without evidence of any fluid retention, or  tethering scarring.  CLINICAL DATA:  Patient presents  for a diagnostic left breast examination due to history of previous left malignant lumpectomy September 2024.   EXAM: DIGITAL DIAGNOSTIC UNILATERAL LEFT MAMMOGRAM WITH TOMOSYNTHESIS AND CAD   TECHNIQUE: Left digital diagnostic mammography and breast tomosynthesis was performed. The images were evaluated with computer-aided detection.   COMPARISON:  Previous exam(s).   ACR Breast Density Category b: There are scattered areas of fibroglandular density.   FINDINGS: Examination demonstrates expected post lumpectomy changes over the upper central left breast. Remainder of the exam is unchanged.   IMPRESSION: Expected post lumpectomy changes of the left breast.   RECOMMENDATION: Recommend continued annual bilateral diagnostic mammographic evaluation as patient's next annual bilateral mammogram is due in August 2025.   I have discussed the findings and recommendations with the patient. If applicable, a reminder letter will be sent to the patient regarding the next appointment.   BI-RADS CATEGORY  2: Benign.     Electronically Signed   By: Elberta Fortis M.D.   On: 01/28/2024 11:06  Assessment/Plan: This is a 73 y.o. female 13 days s/p breast conservation surgery.  She is completed radiation, and continues her letrozole.  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  Follow-up bilateral breast imaging in August for resumption of a annual screening.  And long-term follow-up, physical exam in 1 year, or as needed in August based on imaging.   Campbell Lerner M.D., FACS 02/05/2024, 10:43 AM

## 2024-02-02 ENCOUNTER — Other Ambulatory Visit: Payer: Self-pay | Admitting: Family Medicine

## 2024-02-02 ENCOUNTER — Encounter

## 2024-02-02 DIAGNOSIS — F411 Generalized anxiety disorder: Secondary | ICD-10-CM

## 2024-02-04 ENCOUNTER — Encounter

## 2024-02-05 ENCOUNTER — Ambulatory Visit: Payer: Medicare Other | Admitting: Surgery

## 2024-02-05 ENCOUNTER — Encounter: Payer: Self-pay | Admitting: Surgery

## 2024-02-05 VITALS — BP 115/74 | HR 98 | Ht 62.0 in | Wt 160.0 lb

## 2024-02-05 DIAGNOSIS — Z170 Estrogen receptor positive status [ER+]: Secondary | ICD-10-CM | POA: Diagnosis not present

## 2024-02-05 DIAGNOSIS — Z08 Encounter for follow-up examination after completed treatment for malignant neoplasm: Secondary | ICD-10-CM | POA: Diagnosis not present

## 2024-02-05 DIAGNOSIS — C50912 Malignant neoplasm of unspecified site of left female breast: Secondary | ICD-10-CM

## 2024-02-05 NOTE — Patient Instructions (Addendum)
 You may try some allergy nasal spray daily during the allergy season.  The patient has been asked to return to the office in 6 months with a bilateral diagnostic mammogram. We will send you a letter about these appointments.    Continue self breast exams. Call office for any new breast issues or concerns.   Breast Self-Awareness Breast self-awareness is knowing how your breasts look and feel. You need to: Check your breasts on a regular basis. Tell your doctor about any changes. Become familiar with the look and feel of your breasts. This can help you catch a breast problem while it is still small and can be treated. You should do breast self-exams even if you have breast implants. What you need: A mirror. A well-lit room. A pillow or other soft object. How to do a breast self-exam Follow these steps to do a breast self-exam: Look for changes  Take off all the clothes above your waist. Stand in front of a mirror in a room with good lighting. Put your hands down at your sides. Compare your breasts in the mirror. Look for any difference between them, such as: A difference in shape. A difference in size. Wrinkles, dips, and bumps in one breast and not the other. Look at each breast for changes in the skin, such as: Redness. Scaly areas. Skin that has gotten thicker. Dimpling. Open sores (ulcers). Look for changes in your nipples, such as: Fluid coming out of a nipple. Fluid around a nipple. Bleeding. Dimpling. Redness. A nipple that looks pushed in (retracted), or that has changed position. Feel for changes Lie on your back. Feel each breast. To do this: Pick a breast to feel. Place a pillow under the shoulder closest to that breast. Put the arm closest to that breast behind your head. Feel the nipple area of that breast using the hand of your other arm. Feel the area with the pads of your three middle fingers by making small circles with your fingers. Use light, medium,  and firm pressure. Continue the overlapping circles, moving downward over the breast. Keep making circles with your fingers. Stop when you feel your ribs. Start making circles with your fingers again, this time going upward until you reach your collarbone. Then, make circles outward across your breast and into your armpit area. Squeeze your nipple. Check for discharge and lumps. Repeat these steps to check your other breast. Sit or stand in the tub or shower. With soapy water on your skin, feel each breast the same way you did when you were lying down. Write down what you find Writing down what you find can help you remember what to tell your doctor. Write down: What is normal for each breast. Any changes you find in each breast. These include: The kind of changes you find. A tender or painful breast. Any lump you find. Write down its size and where it is. When you last had your monthly period (menstrual cycle). General tips If you are breastfeeding, the best time to check your breasts is after you feed your baby or after you use a breast pump. If you get monthly bleeding, the best time to check your breasts is 5-7 days after your monthly cycle ends. With time, you will become comfortable with the self-exam. You will also start to know if there are changes in your breasts. Contact a doctor if: You see a change in the shape or size of your breasts or nipples. You see a change in  the skin of your breast or nipples, such as red or scaly skin. You have fluid coming from your nipples that is not normal. You find a new lump or thick area. You have breast pain. You have any concerns about your breast health. Summary Breast self-awareness includes looking for changes in your breasts and feeling for changes within your breasts. You should do breast self-awareness in front of a mirror in a well-lit room. If you get monthly periods (menstrual cycles), the best time to check your breasts is 5-7  days after your period ends. Tell your doctor about any changes you see in your breasts. Changes include changes in size, changes on the skin, painful or tender breasts, or fluid from your nipples that is not normal. This information is not intended to replace advice given to you by your health care provider. Make sure you discuss any questions you have with your health care provider. Document Revised: 04/04/2022 Document Reviewed: 08/30/2021 Elsevier Patient Education  2024 [employer].

## 2024-02-08 DIAGNOSIS — B348 Other viral infections of unspecified site: Secondary | ICD-10-CM | POA: Insufficient documentation

## 2024-02-08 DIAGNOSIS — J189 Pneumonia, unspecified organism: Secondary | ICD-10-CM | POA: Insufficient documentation

## 2024-02-09 ENCOUNTER — Encounter

## 2024-02-11 ENCOUNTER — Encounter

## 2024-02-12 ENCOUNTER — Telehealth: Payer: Self-pay | Admitting: *Deleted

## 2024-02-12 NOTE — Transitions of Care (Post Inpatient/ED Visit) (Signed)
 02/12/2024  Name: Stephanie Hess MRN: [id number] DOB: 30-Oct-1951  Today's TOC FU Call Status: Today's TOC FU Call Status:: Successful TOC FU Call Completed TOC FU Call Complete Date: 02/12/24 Patient's Name and Date of Birth confirmed.  Transition Care Management Follow-up Telephone Call Date of Discharge: 02/11/24 Discharge Facility: Other [facility]) Name of Other (Non-[facility]) Discharge Facility: [facility] Type of Discharge: Inpatient Admission Primary Inpatient Discharge Diagnosis:: LUL Pneumonia sepsis in setting of COPD How have you been since you were released from the hospital?: Better ("I am fine, went out to get my nails done today; did okay driving myself; taking all of the new medicine they told me to.  Thanks for getting me the appointment scheduled.  I appreciate the call, but all these questions are stressing me out") Any questions or concerns?: No  Items Reviewed: Did you receive and understand the discharge instructions provided?: Yes (briefly reviewed with patient who verbalizes good understanding of same - outside hospital AVS) Medications obtained,verified, and reconciled?: Yes (Medications Reviewed) Any new allergies since your discharge?: No Dietary orders reviewed?: Yes Type of Diet Ordered:: Regular" Do you have support at home?: Yes People in Home: child(ren), adult, grandchild(ren) Name of Support/Comfort Primary Source: Reports independent in self-care activities; resides with supportive adult daughter and grandchild who assists as/ if needed/ indicated  Medications Reviewed Today: Medications Reviewed Today     Reviewed by Michaela Corner, RN (Registered Nurse) on 02/12/24 at 1702  Med List Status: <None>   Medication Order Taking? Sig Documenting Provider Last Dose Status Informant  acetaminophen (TYLENOL) 500 MG tablet [id number] Yes Take 1,000 mg by mouth every 6 (six) hours as needed. [provider] Taking Active    azithromycin (ZITHROMAX) 250 MG tablet [id number] Yes Take by mouth daily. 02/12/24: Reports during TOC call, prescribed by [facility] discharging doctor at time of discharge on 02/11/24- reports to take starting 02/14/24 after Levaquin completed Alyson Reedy, FNP Taking Active Self           Med Note Michaela Corner   Thu Feb 12, 2024  3:24 PM) 02/12/24: Reports during [facility] call, prescribed by [facility] discharging doctor at time of discharge on 02/11/24- reports to take starting 02/14/24 after Levaquin completed   budesonide (PULMICORT) 0.5 MG/2ML nebulizer solution [id number] Yes Take 2 mLs by nebulization 2 (two) times daily. [provider] Taking Active   busPIRone (BUSPAR) 10 MG tablet [id number] Yes TAKE 1/2 TO 1 TABLET UP TO THREE TIMES DAILY AS NEEDED FOR ANXIETY Saralyn Pilar A, PA Taking Active   formoterol (PERFOROMIST) 20 MCG/2ML nebulizer solution [id number] Yes INHALE 2 ML (20 MCG TOTAL) BY NEBULIZATION TWO (2) TIMES A DAY. [provider] Taking Active   HYDROcodone bit-homatropine (HYCODAN) 5-1.5 MG/5ML syrup [id number] Yes Take 5 mLs by mouth every 6 (six) hours as needed for cough. 02/12/24: Reports during TOC call, prescribed by [facility] discharging doctor at time of discharge on 02/11/24 Alyson Reedy, FNP Taking Active Self           Med Note Michaela Corner   Thu Feb 12, 2024  3:32 PM) 02/12/24: Reports during [facility] call, prescribed by [facility] discharging doctor at time of discharge on 02/11/24   ipratropium (ATROVENT) 0.02 % nebulizer solution [id number] Yes Take 2.5 mLs (0.5 mg total) by nebulization 4 (four) times daily. Carlean Jews, NP Taking Active   letrozole [facility]) 2.5 MG tablet [id number] Yes Take 1 tablet (2.5 mg total) by mouth daily. Start after radiation  is complete Creig Hines, MD Taking Active   levalbuterol [facility]) 45 MCG/ACT inhaler [id number] Yes INHALE 1 PUFF INTO THE LUNGS EVERY 6 HOURS AS NEEDED FOR WHEEZING. Carlean Jews, NP Taking Active   levofloxacin  (LEVAQUIN) 750 MG tablet [id number] Yes Take 750 mg by mouth daily. 02/12/24: Reports during TOC call, prescribed by [facility] discharging doctor at time of discharge on 02/11/24 Alyson Reedy, FNP Taking Active Self           Med Note Michaela Corner   Thu Feb 12, 2024  3:19 PM) 02/12/24: Reports during [facility] call, prescribed by [facility] discharging doctor at time of discharge on 02/11/24- to take for 3 days   LORazepam (ATIVAN) 0.5 MG tablet [id number] Yes TAKE 0.5-1 TABLETS (0.25-0.5 MG TOTAL) BY MOUTH AS NEEDED FOR ANXIETY (FOR BREAKTHROUGH ANXIETY). Sandre Kitty, MD Taking Active   omeprazole (PRILOSEC) 20 MG capsule [id number] No Take 20 mg by mouth daily.  Patient not taking: Reported on 02/12/2024   [provider] Not Taking Active            Med Note Michaela Corner   Thu Feb 12, 2024  3:33 PM) 02/12/24: Reports during [facility] call, discontinued by [facility] discharging doctor at time of discharge on 02/11/24   OXYGEN [id number] Yes Inhale into the lungs. 2 litre at night [provider] Taking Active Self  pantoprazole (PROTONIX) 40 MG tablet [id number] Yes Take 40 mg by mouth 2 (two) times daily. 02/12/24: Reports during TOC call, prescribed by [facility] discharging doctor at time of discharge on 02/11/24 Alyson Reedy, FNP Taking Active Self           Med Note Michaela Corner   Thu Feb 12, 2024  3:18 PM) 02/12/24: Reports during [facility] call, prescribed by [facility] discharging doctor at time of discharge on 02/11/24   predniSONE (DELTASONE) 10 MG tablet [id number] Yes Take 10 mg by mouth daily. [provider] Taking Active   sertraline (ZOLOFT) 25 MG tablet [id number] Yes Take 25 mg by mouth daily. 02/12/24: Reports during TOC call, prescribed by [facility] discharging doctor at time of discharge on 02/11/24- reports to take gradually increasing dose post- wants to discuss with PCP- has HFU office visit on 02/18/24 Alyson Reedy, FNP Taking Active Self           Med Note Michaela Corner   Thu Feb 12, 2024  3:22 PM)  02/12/24: Reports during [facility] call, prescribed by [facility] discharging doctor at time of discharge on 02/11/24- reports to take gradually increasing dose post- wants to discuss with PCP- has HFU office visit on 02/18/24   sodium chloride HYPERTONIC 3 % nebulizer solution [id number] Yes Take 4 mLs by nebulization as needed for other. 02/12/24: Reports during TOC call, prescribed by [facility] discharging doctor at time of discharge on 02/11/24 Alyson Reedy, FNP Taking Active Self           Med Note Michaela Corner   Thu Feb 12, 2024  3:29 PM) 02/12/24: Reports during [facility] call, prescribed by [facility] discharging doctor at time of discharge on 02/11/24   traZODone (DESYREL) 50 MG tablet [id number] Yes Take 0.5-1 tablets (25-50 mg total) by mouth at bedtime as needed. for sleep Melida Quitter, PA Taking Active            Med Note Michaela Corner   Thu Feb 12, 2024  3:26 PM) 02/12/24: Reports during [facility] call, continued by [facility] discharging doctor at  time of discharge on 02/11/24- reports was told to increase to 100 mg po qHS            Home Care and Equipment/Supplies: Were Home Health Services Ordered?: No (patient confirms "they suggested home health PT, but I don't want that- I don't think I need it" confirmed she declined home health services) Any new equipment or medical supplies ordered?: No  Functional Questionnaire: Do you need assistance with bathing/showering or dressing?: No Do you need assistance with meal preparation?: No Do you need assistance with eating?: No Do you have difficulty maintaining continence: No Do you need assistance with getting out of bed/getting out of a chair/moving?: No Do you have difficulty managing or taking your medications?: No  Follow up appointments reviewed: PCP Follow-up appointment confirmed?: Yes (care coordination outreach in real-time with scheduling care guide to successfully schedule hospital follow up PCP appointment 02/18/24) Date of PCP follow-up appointment?:  02/18/24 Follow-up Provider: PCP: Alyson Reedy, NP Specialist Hospital Follow-up appointment confirmed?: NA (patient reports no other provider office visit recommended post-hospital discharge: unable to verify from review of outside hospital records) Do you need transportation to your follow-up appointment?: No Do you understand care options if your condition(s) worsen?: Yes-patient verbalized understanding  SDOH Interventions Today    Flowsheet Row Most Recent Value  SDOH Interventions   Food Insecurity Interventions Intervention Not Indicated  Housing Interventions Intervention Not Indicated  Transportation Interventions Intervention Not Indicated  [drives self]  Utilities Interventions Intervention Not Indicated      Interventions Today    Flowsheet Row Most Recent Value  Chronic Disease   Chronic disease during today's visit Chronic Obstructive Pulmonary Disease (COPD), Other  [pneumonia]  General Interventions   General Interventions Discussed/Reviewed General Interventions Discussed, Durable Medical Equipment (DME), Doctor Visits  Doctor Visits Discussed/Reviewed PCP, Specialist, Doctor Visits Discussed  Durable Medical Equipment (DME) Other  [confirmed not currently requiring/ using assistive devices for ambulation]  PCP/Specialist Visits Compliance with follow-up visit  Education Interventions   Education Provided Provided Education  Provided Verbal Education On Medication, When to see the doctor  [note patient is on multiple nebulizer medications as well as anxiolytics/ narcotics-- strongly encouraged to discuss all with PCP at time of hospital follow up, arranged for patient on 02/18/24]  Nutrition Interventions   Nutrition Discussed/Reviewed Nutrition Discussed  Pharmacy Interventions   Pharmacy Dicussed/Reviewed Pharmacy Topics Discussed  [Full medication review with updating medication list in EHR per patient report]  Safety Interventions   Safety Discussed/Reviewed  Safety Discussed, Fall Risk  [multiple sedating medications, provided education/ reinforcement around fall prevention]      TOC Interventions Today    Flowsheet Row Most Recent Value  TOC Interventions   TOC Interventions Discussed/Reviewed TOC Interventions Discussed, Arranged PCP follow up within 7 days/Care Guide scheduled  [Patient declines need for ongoing/ further care management outreach,  declines enrollment in 30-day TOC program,  provided my direct contact information should questions/ concerns/ needs arise post-TOC call]      Total time spent from review to signing of note/ including any care coordination interventions: 63 minutes: outside hospital review; extensive medication changes/ reconciliation process  Pls call/ message for questions,  Caryl Pina, RN, BSN, Media planner  Transitions of Care  [facility] [phone]: direct office

## 2024-02-16 ENCOUNTER — Encounter

## 2024-02-18 ENCOUNTER — Encounter

## 2024-02-18 ENCOUNTER — Ambulatory Visit
Admission: RE | Admit: 2024-02-18 | Discharge: 2024-02-18 | Disposition: A | Discharge status: Home / Self Care | Source: Ambulatory Visit | Attending: Family Medicine | Admitting: Family Medicine

## 2024-02-18 ENCOUNTER — Encounter: Payer: Self-pay | Admitting: Family Medicine

## 2024-02-18 ENCOUNTER — Ambulatory Visit: Admitting: Family Medicine

## 2024-02-18 ENCOUNTER — Ambulatory Visit
Admission: RE | Admit: 2024-02-18 | Discharge: 2024-02-18 | Disposition: A | Discharge status: Home / Self Care | Attending: Family Medicine | Admitting: Family Medicine

## 2024-02-18 VITALS — BP 124/76 | HR 101 | Temp 97.7°F | Ht 62.21 in | Wt 159.6 lb

## 2024-02-18 DIAGNOSIS — J189 Pneumonia, unspecified organism: Secondary | ICD-10-CM | POA: Diagnosis not present

## 2024-02-18 DIAGNOSIS — F411 Generalized anxiety disorder: Secondary | ICD-10-CM

## 2024-02-18 MED ORDER — SODIUM CHLORIDE 3 % IN NEBU: 4.0000 mL | INHALATION_SOLUTION | RESPIRATORY_TRACT | 1 refills | Status: AC | PRN

## 2024-02-18 MED ORDER — OMEPRAZOLE 20 MG PO CPDR
20.0000 mg | DELAYED_RELEASE_CAPSULE | Freq: Every day | ORAL | 2 refills | Status: AC
Start: 2024-02-18 — End: ?

## 2024-02-18 MED ORDER — SERTRALINE HCL 25 MG PO TABS: 25.0000 mg | ORAL_TABLET | Freq: Every day | ORAL | 2 refills | Status: DC

## 2024-02-18 MED ORDER — TRAZODONE HCL 100 MG PO TABS: 100.0000 mg | ORAL_TABLET | Freq: Every evening | ORAL | 2 refills | Status: DC | PRN

## 2024-02-18 MED ORDER — AZITHROMYCIN 250 MG PO TABS: 250.0000 mg | ORAL_TABLET | Freq: Every day | ORAL | 2 refills | Status: DC

## 2024-02-18 NOTE — Progress Notes (Signed)
 Established Patient Office Visit  Subjective  Patient ID: Stephanie Hess, female    DOB: 31-Dec-1950  Age: 73 y.o. MRN: [id number]  Chief Complaint  Patient presents with   Hospitalization Follow-up    Pt. Here for a hospital f/u that was on 02/07/24 due to sepisis from PNA and Rhinoviris.     Stephanie Hess is a 73 year old female patient who presents today for hospital follow-up from 02/07/2024 to 02/11/2024. She was admitted for LUL bronchopneumonia, rhinovirus infection and sepsis.   She was feeling better until yesterday- she is now noticing a lot of chest congestion and shortness of breath. Only using 2L O2 at night.  Has an upcoming appt with pulmonology [facility] 03/08/2024 Dr. Dudley Major Smoking less than 1/2 pack per day- she has decreased how much she is smoking.  She is currently taking Atrovent 2.23mL (500 mcg total) by nebulizer 4x/day, formoterol fumarate ( total) BID by nebulizer  Levalbuterol 2 puffs PRN Q4  Sodium cholride 4mL BID- morning and evening  She is on chronic prednisone and azithromycin.   Mood- reports she never started taking Zoloft 25mg  daily, she is concerned when she has an acute anxiety attack that zoloft will not help      02/18/2024    9:19 AM 12/18/2023   11:23 AM 05/23/2023   10:48 AM 11/18/2022    9:28 AM  GAD 7 : Generalized Anxiety Score  Nervous, Anxious, on Edge 0 1 0 0  Control/stop worrying 0 1 0 0  Worry too much - different things 0 1 0 0  Trouble relaxing 0 1 0 0  Restless 0 1 0 0  Easily annoyed or irritable 0 1 0 0  Afraid - awful might happen 0 1 0 0  Total GAD 7 Score 0 7 0 0  Anxiety Difficulty Not difficult at all Not difficult at all Not difficult at all Not difficult at all       02/18/2024    9:18 AM 12/18/2023   11:23 AM 11/20/2023   11:34 AM  PHQ9 SCORE ONLY  PHQ-9 Total Score 0 5 0   Review of Systems  Constitutional:  Negative for chills, fever, malaise/fatigue and weight loss.  HENT:  Negative for congestion and sore  throat.   Eyes:  Negative for blurred vision and double vision.  Respiratory:  Positive for cough, sputum production, shortness of breath and wheezing. Negative for hemoptysis.   Cardiovascular:  Negative for chest pain, palpitations and leg swelling.  Gastrointestinal:  Negative for nausea and vomiting.  Genitourinary:  Negative for frequency and urgency.  Musculoskeletal:  Negative for myalgias.  Neurological:  Negative for dizziness and headaches.  Psychiatric/Behavioral:  Negative for depression and suicidal ideas. The patient is nervous/anxious. The patient does not have insomnia.     Objective:     BP 124/76   Pulse (!) 101   Temp 97.7 F (36.5 C) (Oral)   Ht 5' 2.21" (1.58 m)   Wt 159 lb 9.6 oz (72.4 kg)   SpO2 92%   BMI 29.00 kg/m  BP Readings from Last 3 Encounters:  02/18/24 124/76  02/05/24 115/74  01/14/24 (!) 127/94     Physical Exam Vitals reviewed.  Constitutional:      Appearance: Normal appearance.  Cardiovascular:     Rate and Rhythm: Normal rate and regular rhythm.     Pulses: Normal pulses.     Heart sounds: Normal heart sounds.  Pulmonary:     Effort: Tachypnea and  prolonged expiration present.     Breath sounds: Examination of the right-upper field reveals wheezing. Examination of the left-upper field reveals wheezing. Examination of the right-middle field reveals wheezing. Examination of the left-middle field reveals wheezing. Examination of the right-lower field reveals decreased breath sounds and wheezing. Examination of the left-lower field reveals decreased breath sounds and wheezing. Decreased breath sounds and wheezing present.  Neurological:     Mental Status: She is alert.  Psychiatric:        Mood and Affect: Mood normal.        Behavior: Behavior normal.     Assessment & Plan:   1. Pneumonia of left upper lobe due to infectious organism (Primary) Patient presents today for hospital follow-up, in which she was hospitalized from  3/29-4/2, for sepsis d/t pneumonia, COPD exacerbation, and rhinovirus. She reports she started to notice an increase in chest congestion and shortness of breath yesterday. She is using 2L oxygen at night and denies needing it during the day. She reports she is smoking about a half pack per day, which is less than her normal. Physical exam with expiratory wheezing present in posterior lung fields. Anterior lung fields clear to auscultation. Slight tachypnea present with prolonged expiration and pursed lip breathing. Declines in-office DuoNeb treatment. Discussed obtaining follow-up STAT CXR due to patient being at high risk for pulmonary complications. Plan to continue taking budesonide 0.5mg  nebs BID, formoterol nebs BID, ipratropium QID, and levalbuterol 2 puffs Q4 PRN. 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 She has an upcoming appointment with [facility] pulmonology Dr. Dudley Major on 03/08/2024.  - DG Chest 2 View; Future  2. Generalized anxiety disorder GAD-7 completed with score of 0. Patient appears physically anxious and is most likely increased by her shortness of breath and chronic COPD. Patient reports she has not been taking Zoloft and is concerned about not having Ativan to use PRN. Discussed benefits of taking Zoloft 25mg  daily, increasing to 50mg  in one week, and continuing to use Ativan PRN for acute anxiety attacks. Refills provided to patient. Does not need Ativan refill at this time. PDMP reviewed- rx sent on 02/03/2024.  - traZODone (DESYREL) 100 MG tablet; Take 1 tablet (100 mg total) by mouth at bedtime as needed. for sleep  Dispense: 30 tablet; Refill: 2 - sertraline (ZOLOFT) 25 MG tablet; Take 1 tablet (25 mg total) by mouth daily.  Dispense: 90 tablet; Refill: 2   Return in about 4 weeks (around 03/17/2024) for COPD .    Alyson Reedy, FNP

## 2024-02-18 NOTE — Patient Instructions (Addendum)
 Please get your chest x-ray [facility] and I will let you know about the results.  Plan until you see pulmonologist:  --> will continue budesonide 0.5mg  nebs BID --> will contunue formoterol nebs BID --> Will continue ipatropium QID  --> take levalubterol 2 puffs with wheezing or increase in shortness of breath every 4 hours as needed  --> She needs to focus on smoking cessation, smoking more with stress for breast Ca --> will continue prednsione 10mg  every day --> will continue azithro 250mg  every day

## 2024-02-23 ENCOUNTER — Encounter

## 2024-02-28 ENCOUNTER — Other Ambulatory Visit: Payer: Self-pay | Admitting: Family Medicine

## 2024-02-28 DIAGNOSIS — F411 Generalized anxiety disorder: Secondary | ICD-10-CM

## 2024-03-10 ENCOUNTER — Ambulatory Visit: Payer: Medicare Other | Attending: Cardiology | Admitting: Cardiology

## 2024-03-10 ENCOUNTER — Encounter: Payer: Self-pay | Admitting: Cardiology

## 2024-03-10 VITALS — BP 138/72 | HR 93 | Resp 16 | Ht 62.0 in | Wt 156.4 lb

## 2024-03-10 DIAGNOSIS — E782 Mixed hyperlipidemia: Secondary | ICD-10-CM

## 2024-03-10 DIAGNOSIS — R0602 Shortness of breath: Secondary | ICD-10-CM | POA: Diagnosis not present

## 2024-03-10 DIAGNOSIS — F17200 Nicotine dependence, unspecified, uncomplicated: Secondary | ICD-10-CM

## 2024-03-10 DIAGNOSIS — I2510 Atherosclerotic heart disease of native coronary artery without angina pectoris: Secondary | ICD-10-CM | POA: Diagnosis not present

## 2024-03-10 MED ORDER — EZETIMIBE 10 MG PO TABS: 10.0000 mg | ORAL_TABLET | Freq: Every day | ORAL | 3 refills | Status: DC

## 2024-03-10 MED ORDER — ASPIRIN 81 MG PO TBEC: 81.0000 mg | DELAYED_RELEASE_TABLET | Freq: Every day | ORAL | Status: DC

## 2024-03-10 NOTE — Patient Instructions (Signed)
 Medication Instructions:  Your physician recommends the following medication changes.  START TAKING: Zetia 10 mg daily Aspirin  81 mg daily  *If you need a refill on your cardiac medications before your next appointment, please call your pharmacy*  Lab Work: No labs ordered today  If you have labs (blood work) drawn today and your tests are completely normal, you will receive your results only by: MyChart Message (if you have MyChart) OR A paper copy in the mail If you have any lab test that is abnormal or we need to change your treatment, we will call you to review the results.  Testing/Procedures: ECHO  Your physician has requested that you have an echocardiogram. Echocardiography is a painless test that uses sound waves to create images of your heart. It provides your doctor with information about the size and shape of your heart and how well your heart's chambers and valves are working.   You may receive an ultrasound enhancing agent through an IV if needed to better visualize your heart during the echo. This procedure takes approximately one hour.  There are no restrictions for this procedure.  This will take place at [address] [facility]) [address], [place] [address]  Please note: We ask at that you not bring children with you during ultrasound (echo/ vascular) testing. Due to room size and safety concerns, children are not allowed in the ultrasound rooms during exams. Our front office staff cannot provide observation of children in our lobby area while testing is being conducted. An adult accompanying a patient to their appointment will only be allowed in the ultrasound room at the discretion of the ultrasound technician under special circumstances. We apologize for any inconvenience.   Follow-Up: At [facility], you and your health needs are our priority.  As part of our continuing mission to provide you with exceptional heart care, our providers are all  part of one team.  This team includes your primary Cardiologist (physician) and Advanced Practice Providers or APPs (Physician Assistants and Nurse Practitioners) who all work together to provide you with the care you need, when you need it.  Your next appointment:   3 month(s)  Provider:   Constancia Delton, MD

## 2024-03-10 NOTE — Progress Notes (Signed)
 Cardiology Office Note:    Date:  03/10/2024   ID:  Stephanie Hess, DOB May 11, 1951, MRN [id number]  PCP:  Wilhelmena Hanson, FNP   [facility] Providers Cardiologist:  None     Referring MD: Wilhelmena Hanson, FNP   No chief complaint on file.   History of Present Illness:    Stephanie Hess is a 73 y.o. female with a hx of CAD  (LAD, RCA calcifications on chest CT), hyperlipidemia, GERD, current smoker x 50+ years, COPD who presents due to aortic atherosclerosis and coronary calcifications.  Current smoker, smoking over 50 years now.  History of aortic and coronary calcifications on chest CT.  Complains of shortness of breath with exertion which is chronic, has a history of COPD.  Denies chest pain.  Previously tried statin for cholesterol control but this caused muscle aches.  Chest CT 11/2020 LAD, RCA calcifications.  Prior notes/testing Echo 2017 EF 55 to 60% Lexiscan Myoview  no evidence for ischemia  Past Medical History:  Diagnosis Date   Breast cancer (HCC)    Cancer (HCC)    COPD (chronic obstructive pulmonary disease) (HCC)    Diverticula of colon    GERD (gastroesophageal reflux disease)    Hyperlipidemia    Personal history of radiation therapy    Renal insufficiency    2009 or 2010    Past Surgical History:  Procedure Laterality Date   APPENDECTOMY     AXILLARY SENTINEL NODE BIOPSY Left 08/06/2023   Procedure: AXILLARY SENTINEL NODE BIOPSY;  Surgeon: Flynn Hylan, MD;  Location: [facility] ORS;  Service: General;  Laterality: Left;   BREAST BIOPSY Left 07/02/2023   US  LT BREAST BX W LOC DEV 1ST LESION IMG BX SPEC US  GUIDE 07/02/2023 [facility]   BREAST LUMPECTOMY Left 08/06/2023   BREAST LUMPECTOMY WITH RADIOFREQUENCY TAG IDENTIFICATION Left 08/06/2023   Procedure: BREAST LUMPECTOMY WITH RADIOFREQUENCY TAG IDENTIFICATION;  Surgeon: Flynn Hylan, MD;  Location: [facility] ORS;  Service: General;  Laterality: Left;   CESAREAN SECTION     ECTOPIC  PREGNANCY SURGERY      Current Medications: Current Meds  Medication Sig   acetaminophen  (TYLENOL ) 500 MG tablet Take 1,000 mg by mouth every 6 (six) hours as needed.   aspirin  EC 81 MG tablet Take 1 tablet (81 mg total) by mouth daily. Swallow whole.   budesonide  (PULMICORT ) 0.5 MG/2ML nebulizer solution Take 2 mLs by nebulization 2 (two) times daily.   busPIRone  (BUSPAR ) 10 MG tablet TAKE 1/2 TO 1 TABLET UP TO THREE TIMES DAILY AS NEEDED FOR ANXIETY   ezetimibe (ZETIA) 10 MG tablet Take 1 tablet (10 mg total) by mouth daily.   formoterol  (PERFOROMIST ) 20 MCG/2ML nebulizer solution INHALE 2 ML (20 MCG TOTAL) BY NEBULIZATION TWO (2) TIMES A DAY.   ipratropium (ATROVENT ) 0.02 % nebulizer solution Take 2.5 mLs (0.5 mg total) by nebulization 4 (four) times daily.   letrozole  (FEMARA ) 2.5 MG tablet Take 1 tablet (2.5 mg total) by mouth daily. Start after radiation is complete   levalbuterol  (XOPENEX  HFA) 45 MCG/ACT inhaler INHALE 1 PUFF INTO THE LUNGS EVERY 6 HOURS AS NEEDED FOR WHEEZING.   LORazepam  (ATIVAN ) 0.5 MG tablet TAKE 0.5-1 TABLETS (0.25-0.5 MG TOTAL) BY MOUTH AS NEEDED FOR ANXIETY (FOR BREAKTHROUGH ANXIETY).   omeprazole  (PRILOSEC) 20 MG capsule Take 1 capsule (20 mg total) by mouth daily.   OXYGEN  Inhale into the lungs. 2 litre at night   predniSONE  (DELTASONE ) 10 MG tablet Take 10 mg by mouth daily.  sodium chloride  HYPERTONIC 3 % nebulizer solution Take 4 mLs by nebulization as needed for other.   traZODone  (DESYREL ) 100 MG tablet Take 1 tablet (100 mg total) by mouth at bedtime as needed. for sleep     Allergies:   Penicillins, Shellfish allergy, Sulfa antibiotics, Clarithromycin, Amoxicillin-pot clavulanate, Iodinated contrast media, Atorvastatin , and Macrobid [nitrofurantoin]   Social History   Socioeconomic History   Marital status: Divorced    Spouse name: Not on file   Number of children: Not on file   Years of education: Not on file   Highest education level: 12th  grade  Occupational History   Not on file  Tobacco Use   Smoking status: Every Day    Current packs/day: 0.50    Average packs/day: 0.5 packs/day for 50.0 years (25.0 ttl pk-yrs)    Types: Cigarettes    Passive exposure: Past   Smokeless tobacco: Never  Vaping Use   Vaping status: Never Used  Substance and Sexual Activity   Alcohol use: Yes    Comment: social   Drug use: No   Sexual activity: Not Currently  Other Topics Concern   Not on file  Social History Narrative   Not on file   Social Drivers of Health   Financial Resource Strain: Medium Risk (12/17/2023)   Overall Financial Resource Strain (CARDIA)    Difficulty of Paying Living Expenses: Somewhat hard  Food Insecurity: No Food Insecurity (02/12/2024)   Hunger Vital Sign    Worried About Running Out of Food in the Last Year: Never true    Ran Out of Food in the Last Year: Never true  Transportation Needs: No Transportation Needs (02/12/2024)   PRAPARE - Administrator, Civil Service (Medical): No    Lack of Transportation (Non-Medical): No  Physical Activity: Inactive (12/17/2023)   Exercise Vital Sign    Days of Exercise per Week: 0 days    Minutes of Exercise per Session: 0 min  Stress: No Stress Concern Present (12/17/2023)   [employer] of Occupational Health - Occupational Stress Questionnaire    Feeling of Stress : Only a little  Social Connections: Socially Isolated (12/17/2023)   Social Connection and Isolation Panel [NHANES]    Frequency of Communication with Friends and Family: More than three times a week    Frequency of Social Gatherings with Friends and Family: Once a week    Attends Religious Services: Never    Database administrator or Organizations: No    Attends Engineer, structural: Never    Marital Status: Divorced     Family History: The patient's family history includes Breast cancer in her paternal aunt; Heart attack in her mother.  ROS:   Please see the history of  present illness.     All other systems reviewed and are negative.  EKGs/Labs/Other Studies Reviewed:    The following studies were reviewed today:  EKG Interpretation Date/Time:  Wednesday March 10 2024 11:14:06 EDT Ventricular Rate:  92 PR Interval:  154 QRS Duration:  86 QT Interval:  354 QTC Calculation: 437 R Axis:   40  Text Interpretation: Normal sinus rhythm Nonspecific ST and T wave abnormality Confirmed by Constancia Delton ([id number]) on 03/10/2024 11:36:25 AM    Recent Labs: 12/19/2023: Hemoglobin 12.7; Platelets 320; TSH 2.100 01/14/2024: ALT 22; BUN 9; Creatinine 0.89; Potassium 3.9; Sodium 131  Recent Lipid Panel    Component Value Date/Time   CHOL 203 (H) 12/19/2023 0850   CHOL 248 (  H) 08/11/2013 0924   TRIG 221 (H) 12/19/2023 0850   TRIG 389 (H) 05/02/2017 1142   HDL 49 12/19/2023 0850   HDL 35 (L) 05/02/2017 1142   CHOLHDL 4.1 12/19/2023 0850   CHOLHDL 6.3 08/12/2016 0849   VLDL 49 (H) 08/12/2016 0849   VLDL 47 (H) 08/11/2013 0924   LDLCALC 116 (H) 12/19/2023 0850   LDLCALC 165 (H) 08/11/2013 0924     Risk Assessment/Calculations:             Physical Exam:    VS:  BP 138/72 (BP Location: Right Arm, Patient Position: Sitting, Cuff Size: Normal)   Pulse 93   Resp 16   Ht 5\' 2"  (1.575 m)   Wt 156 lb 6.4 oz (70.9 kg)   SpO2 92%   BMI 28.61 kg/m     Wt Readings from Last 3 Encounters:  03/10/24 156 lb 6.4 oz (70.9 kg)  02/18/24 159 lb 9.6 oz (72.4 kg)  02/05/24 160 lb (72.6 kg)     GEN:  Well nourished, well developed in no acute distress HEENT: Normal NECK: No JVD; No carotid bruits CARDIAC: RRR, no murmurs, rubs, gallops RESPIRATORY: Rhonchorous breath sounds ABDOMEN: Soft, non-tender, non-distended MUSCULOSKELETAL:  No edema; No deformity  SKIN: Warm and dry NEUROLOGIC:  Alert and oriented x 3 PSYCHIATRIC:  Normal affect   ASSESSMENT:    1. Coronary artery disease involving native coronary artery of native heart, unspecified whether  angina present   2. Mixed hyperlipidemia   3. Current smoker   4. Shortness of breath    PLAN:    In order of problems listed above:  CAD, LAD, RCA calcifications on chest CT.  Denies chest pain, start aspirin  81 mg daily, start Zetia 10 mg daily.  Obtain echocardiogram. Hyperlipidemia, not tolerant to statins.  Start Zetia 10 mg daily. Current smoker, smoking cessation advised. Shortness of breath, chronic, possibly from emphysema and current smoking.  Echo as above.  Follow-up after echo      Medication Adjustments/Labs and Tests Ordered: Current medicines are reviewed at length with the patient today.  Concerns regarding medicines are outlined above.  Orders Placed This Encounter  Procedures   EKG 12-Lead   ECHOCARDIOGRAM COMPLETE   Meds ordered this encounter  Medications   ezetimibe (ZETIA) 10 MG tablet    Sig: Take 1 tablet (10 mg total) by mouth daily.    Dispense:  90 tablet    Refill:  3   aspirin  EC 81 MG tablet    Sig: Take 1 tablet (81 mg total) by mouth daily. Swallow whole.    Patient Instructions  Medication Instructions:  Your physician recommends the following medication changes.  START TAKING: Zetia 10 mg daily Aspirin  81 mg daily  *If you need a refill on your cardiac medications before your next appointment, please call your pharmacy*  Lab Work: No labs ordered today  If you have labs (blood work) drawn today and your tests are completely normal, you will receive your results only by: MyChart Message (if you have MyChart) OR A paper copy in the mail If you have any lab test that is abnormal or we need to change your treatment, we will call you to review the results.  Testing/Procedures: ECHO  Your physician has requested that you have an echocardiogram. Echocardiography is a painless test that uses sound waves to create images of your heart. It provides your doctor with information about the size and shape of your heart and how  well your  heart's chambers and valves are working.   You may receive an ultrasound enhancing agent through an IV if needed to better visualize your heart during the echo. This procedure takes approximately one hour.  There are no restrictions for this procedure.  This will take place at [address] [facility] [address] [facility]) [address], [place] [address]  Please note: We ask at that you not bring children with you during ultrasound (echo/ vascular) testing. Due to room size and safety concerns, children are not allowed in the ultrasound rooms during exams. Our front office staff cannot provide observation of children in our lobby area while testing is being conducted. An adult accompanying a patient to their appointment will only be allowed in the ultrasound room at the discretion of the ultrasound technician under special circumstances. We apologize for any inconvenience.   Follow-Up: At [facility], you and your health needs are our priority.  As part of our continuing mission to provide you with exceptional heart care, our providers are all part of one team.  This team includes your primary Cardiologist (physician) and Advanced Practice Providers or APPs (Physician Assistants and Nurse Practitioners) who all work together to provide you with the care you need, when you need it.  Your next appointment:   3 month(s)  Provider:   Constancia Delton, MD          Signed, Constancia Delton, MD  03/10/2024 1:15 PM    [facility]

## 2024-03-15 ENCOUNTER — Inpatient Hospital Stay: Payer: Medicare Other | Attending: Oncology | Admitting: *Deleted

## 2024-03-15 DIAGNOSIS — Z9189 Other specified personal risk factors, not elsewhere classified: Secondary | ICD-10-CM

## 2024-03-15 NOTE — Progress Notes (Signed)
 SUBJECTIVE: Pt returns for her 3 month L-Dex screen.    PAIN:  Are you having pain? No   SOZO SCREENING: Patient was assessed today using the SOZO machine to determine the lymphedema index score. This was compared to her baseline score. It was determined that she is within the recommended range when compared to her baseline and no further action is needed at this time. She will continue SOZO screenings. These are done every 3 months for 2 years post operatively followed by every 6 months for 2 years, and then annually.     L-DEX FLOWSHEETS                L-DEX LYMPHEDEMA SCREENING    Measurement Type Unilateral     L-DEX MEASUREMENT EXTREMITY Upper Extremity     POSITION  Standing     DOMINANT SIDE Right     At Risk Side Left     BASELINE SCORE (UNILATERAL) -1.1    L-DEX SCORE (UNILATERAL) -0.6    VALUE CHANGE (UNILAT) 0.5

## 2024-03-17 ENCOUNTER — Ambulatory Visit: Admitting: Family Medicine

## 2024-03-23 ENCOUNTER — Encounter: Payer: Self-pay | Admitting: Family Medicine

## 2024-03-23 ENCOUNTER — Telehealth: Payer: Self-pay | Admitting: Family Medicine

## 2024-03-23 ENCOUNTER — Ambulatory Visit (INDEPENDENT_AMBULATORY_CARE_PROVIDER_SITE_OTHER): Admitting: Family Medicine

## 2024-03-23 VITALS — BP 125/76 | HR 96 | Temp 97.9°F | Resp 18 | Ht 62.0 in | Wt 158.0 lb

## 2024-03-23 DIAGNOSIS — F411 Generalized anxiety disorder: Secondary | ICD-10-CM | POA: Diagnosis not present

## 2024-03-23 DIAGNOSIS — J441 Chronic obstructive pulmonary disease with (acute) exacerbation: Secondary | ICD-10-CM | POA: Diagnosis not present

## 2024-03-23 NOTE — Telephone Encounter (Signed)
 Copied from CRM [id number]. Topic: General - Phone/Fax/Address >> Mar 22, 2024  2:16 PM Georgeann Kindred wrote: Sherline Distel from [facility] requesting a status update for a fax that was sent on 05/06 requesting chart notes for the patient. Please fax notes to Fax: [phone] Phone: [phone].

## 2024-03-23 NOTE — Patient Instructions (Signed)

## 2024-03-23 NOTE — Telephone Encounter (Signed)
 Office notes have been faxed.

## 2024-03-23 NOTE — Progress Notes (Signed)
 Established Patient Office Visit  Subjective  Patient ID: Stephanie Hess, female    DOB: Jan 13, 1951  Age: 73 y.o. MRN: [id number]  Chief Complaint  Patient presents with   Anxiety   Depression   ANXIETY: ADAMARIS CASH presents for the medical management of anxiety.  Current medication regimen: did not taking a daily medication-- Zoloft - felt like a zombie on this medication, took for about one week  Ativan  0.25-0.5mg  PRN- yesterday had 2 tablets  Takes when she takes a panic attack Trazodone  100mg  at bedtime prn  Buspar  TID PRN- did not notice an improvement in anxiety with PRN dose   DEPRESSION:  Feels like she deals more so with anxiety  Denies SI/HI.       03/23/2024    8:40 AM 02/18/2024    9:19 AM 12/18/2023   11:23 AM 05/23/2023   10:48 AM  GAD 7 : Generalized Anxiety Score  Nervous, Anxious, on Edge 0 0 1 0  Control/stop worrying 0 0 1 0  Worry too much - different things 0 0 1 0  Trouble relaxing 0 0 1 0  Restless 0 0 1 0  Easily annoyed or irritable 0 0 1 0  Afraid - awful might happen 0 0 1 0  Total GAD 7 Score 0 0 7 0  Anxiety Difficulty Not difficult at all Not difficult at all Not difficult at all Not difficult at all      03/23/2024    8:38 AM 02/18/2024    9:18 AM 12/18/2023   11:23 AM  PHQ9 SCORE ONLY  PHQ-9 Total Score 6 0 5   ROS: see HPI     Objective:      BP 125/76 (BP Location: Left Arm, Patient Position: Sitting, Cuff Size: Normal)   Pulse 96   Temp 97.9 F (36.6 C) (Oral)   Resp 18   Ht 5\' 2"  (1.575 m)   Wt 158 lb (71.7 kg)   SpO2 96%   BMI 28.90 kg/m  BP Readings from Last 3 Encounters:  03/23/24 125/76  03/10/24 138/72  02/18/24 124/76     Physical Exam Constitutional:      Appearance: Normal appearance.  Cardiovascular:     Rate and Rhythm: Normal rate and regular rhythm.     Pulses: Normal pulses.     Heart sounds: Normal heart sounds.  Pulmonary:     Effort: Prolonged expiration present.     Breath sounds:  Examination of the right-upper field reveals wheezing. Examination of the left-upper field reveals wheezing. Examination of the right-middle field reveals wheezing. Examination of the left-middle field reveals wheezing. Examination of the right-lower field reveals wheezing. Examination of the left-lower field reveals wheezing. Wheezing present.  Neurological:     Mental Status: She is alert.  Psychiatric:        Mood and Affect: Mood normal.        Behavior: Behavior normal.       Assessment & Plan:   1. Generalized anxiety disorder (Primary) GAD7 completed with score of 0. Reports occasional panic attacks and shortness of breath. Denies issues with difficulty breathing, palpitations, hyperventilation, and dizziness. Discussed benefits of cognitive behavioral therapy (CBT) and first-line pharmacotherapy concurrently. Patient did not like taking Zoloft , since it made her "feel like a zombie." Patient reports buspar  TID PRN did not help improve her anxiety. Patient would like to continue Ativan  0.25-0.5mg  daily PRN. Denies issues with sleep and depression. PHQ9 completed with score of 6. Denies active  or passive suicidal ideations. No refills needed at this time. PDMP reviewed, no red flags present. Will complete UDS today.  - Compliance Drug Analysis, Ur  2. Chronic obstructive pulmonary disease with acute exacerbation (HCC) Patient has history significant with multiple lung diseases- including ILD, sleep-related hypoxia, COPD and moderate persistent asthma. Vital signs stable today with normal oxygen  saturation. Patient presents in no acute distress and is well-appearing. Physical exam with prolonged expiration with pursed lip breathing. Lung sounds with wheezing throughout all lung fields posteriorly and anteriorly. Oxygen  saturation at 96% on room air. Patient declined in office DuoNeb treatment. Declines CXR. Counseled patient to reach out to office if breathing worsens, shortness of breath  becomes constant, or new symptoms develop- as CXR may be warranted. Discussed pneumonia immunization with patient today and she declines. Does not need refills today. She is followed by [facility] pulmonology and recently saw them on 03/08/2024. Patient will continue with budesonide  0.5mg  nebs BID, formoterol  nebs 20mcg BID, ipratropium QID, hypersal, prednisone  10mg  daily, and azithromycin  250mg  daily. No interest in smoking cessation at this time. Patient verbalized an understanding to reach out if any new symptoms occur or shortness of breath worsens.    Return in about 2 months (around 05/23/2024) for Mood & COPD f/u.    Wilhelmena Hanson, FNP

## 2024-03-26 LAB — COMPLIANCE DRUG ANALYSIS, UR

## 2024-03-29 ENCOUNTER — Ambulatory Visit: Payer: Self-pay | Admitting: Family Medicine

## 2024-04-02 ENCOUNTER — Ambulatory Visit (INDEPENDENT_AMBULATORY_CARE_PROVIDER_SITE_OTHER)

## 2024-04-02 ENCOUNTER — Ambulatory Visit
Admission: EM | Admit: 2024-04-02 | Discharge: 2024-04-02 | Disposition: A | Discharge status: Home / Self Care | Attending: Physician Assistant | Admitting: Physician Assistant

## 2024-04-02 ENCOUNTER — Encounter: Payer: Self-pay | Admitting: Emergency Medicine

## 2024-04-02 ENCOUNTER — Telehealth: Payer: Self-pay | Admitting: Physician Assistant

## 2024-04-02 DIAGNOSIS — R059 Cough, unspecified: Secondary | ICD-10-CM | POA: Diagnosis present

## 2024-04-02 DIAGNOSIS — J441 Chronic obstructive pulmonary disease with (acute) exacerbation: Secondary | ICD-10-CM | POA: Diagnosis present

## 2024-04-02 DIAGNOSIS — R0602 Shortness of breath: Secondary | ICD-10-CM | POA: Diagnosis present

## 2024-04-02 LAB — SARS CORONAVIRUS 2 BY RT PCR: SARS Coronavirus 2 by RT PCR: POSITIVE — AB

## 2024-04-02 MED ORDER — PROMETHAZINE-DM 6.25-15 MG/5ML PO SYRP: 5.0000 mL | ORAL_SOLUTION | Freq: Four times a day (QID) | ORAL | 0 refills | Status: DC | PRN

## 2024-04-02 MED ORDER — DOXYCYCLINE HYCLATE 100 MG PO CAPS: 100.0000 mg | ORAL_CAPSULE | Freq: Two times a day (BID) | ORAL | 0 refills | Status: AC

## 2024-04-02 MED ORDER — PREDNISONE 50 MG PO TABS: 50.0000 mg | ORAL_TABLET | Freq: Every day | ORAL | 0 refills | Status: AC

## 2024-04-02 NOTE — ED Provider Notes (Signed)
 [facility]    CSN: [id number] Arrival date & time: 04/02/24  1317      History   Chief Complaint Chief Complaint  Patient presents with   Cough    HPI Stephanie KLEMMER is a 73 y.o. female with breast cancer, COPD, asthma, ILD, hyperlipidemia, and anxiety. Patient presents for fatigue, cough and congestion for awhile that has worsened over the past 2 days. Patient says "I have been sick with pneumonia every month."  History of pneumonia a little less than 2 months ago.  Denies fever, sore throat, sinus pain, chest pain.  Has had no shortness of breath, wheezing.  No abdominal pain, vomiting or diarrhea.  No sick contacts.  Has been using inhalers at home and taking over-the-counter cough medicine without relief.  Patient has concerns for pneumonia again.  HPI  Past Medical History:  Diagnosis Date   Allergy    Anxiety    Asthma 2022   Breast cancer (HCC)    Cancer (HCC)    COPD (chronic obstructive pulmonary disease) (HCC)    Diverticula of colon    GERD (gastroesophageal reflux disease)    Heart murmur    Hyperlipidemia    Oxygen  deficiency    Personal history of radiation therapy    Renal insufficiency    2009 or 2010    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/2023   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) 07/08/2023   Aortic atherosclerosis (HCC) 05/23/2023   BMI 30.0-30.9,adult 03/07/2022   Generalized anxiety disorder 08/19/2021   Chronic obstructive pulmonary disease (HCC) 07/13/2021   Nicotine  abuse 03/03/2018   Mild intermittent asthma without complication 03/03/2018   Pulmonary fibrosis (HCC) 03/03/2018   GERD (gastroesophageal reflux  disease) 03/03/2018   Hyperlipidemia, mixed 05/15/2017   Vitamin D  deficiency 05/15/2017    Past Surgical History:  Procedure Laterality Date   APPENDECTOMY     AXILLARY SENTINEL NODE BIOPSY Left 08/06/2023   Procedure: AXILLARY SENTINEL NODE BIOPSY;  Surgeon: Flynn Hylan, MD;  Location: [facility];  Service: General;  Laterality: Left;   BREAST BIOPSY Left 07/02/2023   US  LT BREAST BX W LOC DEV 1ST LESION IMG BX SPEC US  GUIDE 07/02/2023 [facility]   BREAST LUMPECTOMY Left 08/06/2023   BREAST LUMPECTOMY WITH RADIOFREQUENCY TAG IDENTIFICATION Left 08/06/2023   Procedure: BREAST LUMPECTOMY WITH RADIOFREQUENCY TAG IDENTIFICATION;  Surgeon: Flynn Hylan, MD;  Location: [facility];  Service: General;  Laterality: Left;   CESAREAN SECTION     ECTOPIC PREGNANCY SURGERY     TUBAL LIGATION      OB History     Gravida  4   Para  4   Term  2   Preterm      AB      Living  2      SAB      IAB      Ectopic      Multiple      Live Births               Home Medications    Prior to Admission medications   Medication Sig Start Date End Date Taking? Authorizing Provider  predniSONE  (DELTASONE ) 50 MG tablet  Take 1 tablet (50 mg total) by mouth daily for 5 days. 04/02/24 04/07/24 Yes Nancy Axon B, PA-C  promethazine -dextromethorphan (PROMETHAZINE -DM) 6.25-15 MG/5ML syrup Take 5 mLs by mouth 4 (four) times daily as needed. 04/02/24  Yes Nancy Axon B, PA-C  acetaminophen  (TYLENOL ) 500 MG tablet Take 1,000 mg by mouth every 6 (six) hours as needed.    [provider]  aspirin  EC 81 MG tablet Take 1 tablet (81 mg total) by mouth daily. Swallow whole. 03/10/24   Constancia Delton, MD  budesonide  (PULMICORT ) 0.5 MG/2ML nebulizer solution Take 2 mLs by nebulization 2 (two) times daily. 05/11/23   [provider]  ezetimibe  (ZETIA ) 10 MG tablet Take 1 tablet (10 mg total) by mouth daily. Patient not taking: Reported on 03/23/2024 03/10/24 03/10/25   Constancia Delton, MD  formoterol  (PERFOROMIST ) 20 MCG/2ML nebulizer solution INHALE 2 ML (20 MCG TOTAL) BY NEBULIZATION TWO (2) TIMES A DAY. 09/18/21   [provider]  ipratropium (ATROVENT ) 0.02 % nebulizer solution Take 2.5 mLs (0.5 mg total) by nebulization 4 (four) times daily. 08/08/21   Boscia, Heather E, NP  letrozole  (FEMARA ) 2.5 MG tablet Take 1 tablet (2.5 mg total) by mouth daily. Start after radiation is complete 09/03/23   Avonne Boettcher, MD  levalbuterol  (XOPENEX  HFA) 45 MCG/ACT inhaler INHALE 1 PUFF INTO THE LUNGS EVERY 6 HOURS AS NEEDED FOR WHEEZING. 02/20/23   Boscia, Rumaldo Countess, NP  LORazepam  (ATIVAN ) 0.5 MG tablet TAKE 0.5-1 TABLETS (0.25-0.5 MG TOTAL) BY MOUTH AS NEEDED FOR ANXIETY (FOR BREAKTHROUGH ANXIETY). 02/03/24   Laneta Pintos, MD  omeprazole  (PRILOSEC) 20 MG capsule Take 1 capsule (20 mg total) by mouth daily. 02/18/24   Wilhelmena Hanson, FNP  OXYGEN  Inhale into the lungs. 2 litre at night    [provider]  predniSONE  (DELTASONE ) 10 MG tablet Take 10 mg by mouth daily. 05/11/23   [provider]  sodium chloride  HYPERTONIC 3 % nebulizer solution Take 4 mLs by nebulization as needed for other. 02/18/24   Butler, Kristina, FNP  traZODone  (DESYREL ) 100 MG tablet Take 1 tablet (100 mg total) by mouth at bedtime as needed. for sleep 02/18/24   Wilhelmena Hanson, FNP    Family History Family History  Problem Relation Age of Onset   Breast cancer Paternal Aunt    Heart attack Mother     Social History Social History   Tobacco Use   Smoking status: Every Day    Current packs/day: 0.50    Average packs/day: 0.5 packs/day for 50.0 years (25.0 ttl pk-yrs)    Types: Cigarettes    Passive exposure: Past   Smokeless tobacco: Never  Vaping Use   Vaping status: Never Used  Substance Use Topics   Alcohol use: Yes    Comment: social   Drug use: No     Allergies   Penicillins, Shellfish allergy, Sulfa antibiotics, Clarithromycin, Amoxicillin-pot  clavulanate, Iodinated contrast media, Atorvastatin , and Macrobid [nitrofurantoin]   Review of Systems Review of Systems  Constitutional:  Positive for fatigue. Negative for chills, diaphoresis and fever.  HENT:  Positive for congestion and rhinorrhea. Negative for ear pain, sinus pressure, sinus pain and sore throat.   Respiratory:  Positive for cough, shortness of breath and wheezing.   Cardiovascular:  Negative for chest pain.  Gastrointestinal:  Negative for abdominal pain, nausea and vomiting.  Musculoskeletal:  Negative for arthralgias and myalgias.  Skin:  Negative for rash.  Neurological:  Negative for weakness and headaches.  Hematological:  Negative for adenopathy.     Physical Exam Triage Vital Signs ED Triage Vitals  Encounter Vitals Group     BP 04/02/24 1332 127/72     Systolic BP Percentile --      Diastolic BP Percentile --      Pulse Rate 04/02/24 1332 97     Resp 04/02/24 1332 16     Temp 04/02/24 1332 98.2 F (36.8 C)     Temp Source 04/02/24 1332 Oral     SpO2 04/02/24 1332 96 %     Weight 04/02/24 1330 158 lb 1.1 oz (71.7 kg)     Height 04/02/24 1330 5\' 2"  (1.575 m)     Head Circumference --      Peak Flow --      Pain Score 04/02/24 1330 7     Pain Loc --      Pain Education --      Exclude from Growth Chart --    No data found.  Updated Vital Signs BP 127/72 (BP Location: Left Arm)   Pulse 97   Temp 98.2 F (36.8 C) (Oral)   Resp 16   Ht 5\' 2"  (1.575 m)   Wt 158 lb 1.1 oz (71.7 kg)   SpO2 96%   BMI 28.91 kg/m     Physical Exam Vitals and nursing note reviewed.  Constitutional:      General: She is not in acute distress.    Appearance: Normal appearance. She is ill-appearing. She is not toxic-appearing.  HENT:     Head: Normocephalic and atraumatic.     Nose: Congestion present.     Mouth/Throat:     Mouth: Mucous membranes are moist.     Pharynx: Oropharynx is clear.  Eyes:     General: No scleral icterus.       Right eye: No  discharge.        Left eye: No discharge.     Conjunctiva/sclera: Conjunctivae normal.  Cardiovascular:     Rate and Rhythm: Normal rate and regular rhythm.     Heart sounds: Normal heart sounds.  Pulmonary:     Effort: Pulmonary effort is normal. No respiratory distress.     Breath sounds: Wheezing and rhonchi present.  Musculoskeletal:     Cervical back: Neck supple.  Skin:    General: Skin is dry.  Neurological:     General: No focal deficit present.     Mental Status: She is alert. Mental status is at baseline.     Motor: No weakness.     Gait: Gait normal.  Psychiatric:        Mood and Affect: Mood normal.        Behavior: Behavior normal.      UC Treatments / Results  Labs (all labs ordered are listed, but only abnormal results are displayed) Labs Reviewed  SARS CORONAVIRUS 2 BY RT PCR    EKG   Radiology No results found.  Procedures Procedures (including critical care time)  Medications Ordered in UC Medications - No data to display  Initial Impression / Assessment and Plan / UC Course  I have reviewed the triage vital signs and the nursing notes.  Pertinent labs & imaging results that were available during my care of the patient were reviewed by me and considered in my medical decision making (see chart for details).    72 y/o female with breast cancer, COPD, asthma, ILD, hyperlipidemia, and anxiety. Patient presents for cough and congestion for  awhile that has worsened over the past 2 days. Also reports shortness of breath. History of pneumonia a little less than 2 months ago.   Vitals normal and stable. Ill appearing but non toxic. No resp distress. On exam has nasal congestion. Scattered rhonchi and wheezing throughout.   Chest xray obtained. Suspicion for right sided pneumonia. Will call patient with results.   COVID test obtained.   Offered nebulizer in clinic but she declined.  COPD exacerbation. Sent prednisone  and promethazine  DM. Supportive  care. Continue home meds and nebulizer. Advised PCP and pulmonology follow up. ED precautions discussed.   Chest x-ray over read negative.  Culture results with patient.  Sent doxycycline  to pharmacy for COPD exacerbation.  Follow-up with sodium specialist symptoms are improving worsening.   Final Clinical Impressions(s) / UC Diagnoses   Final diagnoses:  Cough, unspecified type  COPD exacerbation (HCC)  Shortness of breath     Discharge Instructions      -Will call with results -Sent prednisone  and cough meds -Continue inhalers -Will send levaquin  if pneumonia. Will send something different if you do not have confirmed pneumonia -Please follow up with PCP and/or pulmonology -Go to ER if sudden fever, chest pain, increased breathing issues or weakness   ED Prescriptions     Medication Sig Dispense Auth. Provider   predniSONE  (DELTASONE ) 50 MG tablet Take 1 tablet (50 mg total) by mouth daily for 5 days. 5 tablet Nancy Axon B, PA-C   promethazine -dextromethorphan (PROMETHAZINE -DM) 6.25-15 MG/5ML syrup Take 5 mLs by mouth 4 (four) times daily as needed. 118 mL Floydene Hy, PA-C      PDMP not reviewed this encounter.   Floydene Hy, PA-C 04/02/24 [phone]

## 2024-04-02 NOTE — ED Triage Notes (Signed)
 Patient reports ongoing cough for awhile.  Patient states that her chest congestion and cough has gotten worse over the past 2 days.

## 2024-04-02 NOTE — Discharge Instructions (Signed)
-  Will call with results -Sent prednisone  and cough meds -Continue inhalers -Will send levaquin  if pneumonia. Will send something different if you do not have confirmed pneumonia -Please follow up with PCP and/or pulmonology -Go to ER if sudden fever, chest pain, increased breathing issues or weakness

## 2024-04-02 NOTE — Telephone Encounter (Signed)
 Called to results to patient.  Radiologist did not determine any pneumonia on x-ray.  Sent doxycycline  to pharmacy for COPD exacerbation.

## 2024-04-05 ENCOUNTER — Ambulatory Visit (HOSPITAL_COMMUNITY): Payer: Self-pay

## 2024-04-06 NOTE — Telephone Encounter (Signed)
-----   Message from [facility] April Z sent at 04/06/2024 11:45 AM EDT ----- We received this in our fax

## 2024-04-06 NOTE — Telephone Encounter (Signed)
 Office notes faxed to [facility] at [phone]

## 2024-04-12 ENCOUNTER — Ambulatory Visit: Payer: Self-pay

## 2024-04-12 NOTE — Telephone Encounter (Signed)
 Copied From CRM [id number]. Reason for Triage: Tested positive for covid.. fever  Chief Complaint: COVID + Symptoms: Headache, body aches, earache productive cough, recent fever Frequency: 2 weeks Pertinent Negatives: Patient denies difficulty breathing Disposition: [] ED /[] Urgent Care (no appt availability in office) / [x] Appointment(In office/virtual)/ []  [facility]/ [] Home Care/ [] Refused Recommended Disposition /[] [facility]/ []  Follow-up with PCP Additional Notes: Patient called in to report COVID symptoms. Patient stated symptoms started about 2 weeks ago. Patient stated she went to UC on 04/02/24 and to the ED over the weekend for symptoms. Patient reported headache, body aches, earache, productive cough and recent fever. Patient denied difficulty breathing. Advised patient to be seen within 24 hours, per protocol. Scheduled with PCP tomorrow morning. Provided care advice and instructed patient to call back if symptoms worsen. Patient complied.   Reason for Disposition  [1] HIGH RISK patient (e.g., weak immune system, age > 64 years, obesity with BMI 30 or higher, pregnant, chronic lung disease or other chronic medical condition) AND [2] COVID symptoms (e.g., cough, fever)  (Exceptions: Already seen by PCP and no new or worsening symptoms.)  Answer Assessment - Initial Assessment Questions 1. COVID-19 DIAGNOSIS: "How do you know that you have COVID?" (e.g., positive lab test or self-test, diagnosed by doctor or NP/PA, symptoms after exposure).     States she tested positive at the ED over the weekend  3. ONSET: "When did the COVID-19 symptoms start?"      About 2 weeks ago, went to UC on 04/02/24 4. WORST SYMPTOM: "What is your worst symptom?" (e.g., cough, fever, shortness of breath, muscle aches)     Headache and earache  5. COUGH: "Do you have a cough?" If Yes, ask: "How bad is the cough?"       Productive cough- yellow/white phlegm  6. FEVER: "Do you have a  fever?" If Yes, ask: "What is your temperature, how was it measured, and when did it start?"     Yes and chills, states temperature was 97 this morning 7. RESPIRATORY STATUS: "Describe your breathing?" (e.g., normal; shortness of breath, wheezing, unable to speak)      Denies difficulty breathing  8. BETTER-SAME-WORSE: "Are you getting better, staying the same or getting worse compared to yesterday?"  If getting worse, ask, "In what way?"     Same 9. OTHER SYMPTOMS: "Do you have any other symptoms?"  (e.g., chills, fatigue, headache, loss of smell or taste, muscle pain, sore throat)     Headache, body aches, earache 10. HIGH RISK DISEASE: "Do you have any chronic medical problems?" (e.g., asthma, heart or lung disease, weak immune system, obesity, etc.)     COPD  Protocols used: Coronavirus (COVID-19) Diagnosed or Suspected-A-AH

## 2024-04-12 NOTE — Telephone Encounter (Signed)
 Please let me know what to tell her. Thank you.

## 2024-04-13 ENCOUNTER — Encounter: Payer: Self-pay | Admitting: Family Medicine

## 2024-04-13 ENCOUNTER — Ambulatory Visit (INDEPENDENT_AMBULATORY_CARE_PROVIDER_SITE_OTHER): Admitting: Family Medicine

## 2024-04-13 VITALS — BP 111/69 | HR 85 | Temp 98.2°F | Wt 155.0 lb

## 2024-04-13 DIAGNOSIS — J1282 Pneumonia due to coronavirus disease 2019: Secondary | ICD-10-CM | POA: Diagnosis not present

## 2024-04-13 DIAGNOSIS — F411 Generalized anxiety disorder: Secondary | ICD-10-CM | POA: Diagnosis not present

## 2024-04-13 DIAGNOSIS — U071 COVID-19: Secondary | ICD-10-CM

## 2024-04-13 MED ORDER — LORAZEPAM 0.5 MG PO TABS: 0.2500 mg | ORAL_TABLET | ORAL | 0 refills | Status: DC | PRN

## 2024-04-13 MED ORDER — LEVOFLOXACIN 750 MG PO TABS: 750.0000 mg | ORAL_TABLET | Freq: Every day | ORAL | 0 refills | Status: AC

## 2024-04-13 NOTE — Progress Notes (Signed)
 Acute Care Office Visit  Subjective:   Stephanie Hess May 25, 1951 04/13/2024  Chief Complaint  Patient presents with   Covid Positive   HPI: Went to UC, treated for COPD exacerbation with doxycycline .  Went to another UC again on 5/31 and tested positive for COVID. Had CXR completed showing "multifocal bronchopneumonia." Patient did leave AMA before treated was provided to patient. She presents today with concerns for ongoing cough and body aches. She also reports headache, ear ache and fever. Her breathing "is about the same, it's good this morning."   The following portions of the patient's history were reviewed and updated as appropriate: past medical history, past surgical history, family history, social history, allergies, medications, and problem list.   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/2023   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) 07/08/2023   Aortic atherosclerosis (HCC) 05/23/2023   BMI 30.0-30.9,adult 03/07/2022   Generalized anxiety disorder 08/19/2021   Chronic obstructive pulmonary disease (HCC) 07/13/2021   Nicotine  abuse 03/03/2018   Mild intermittent asthma without complication 03/03/2018   Pulmonary fibrosis (HCC) 03/03/2018   GERD (gastroesophageal reflux disease) 03/03/2018   Hyperlipidemia, mixed 05/15/2017   Vitamin D  deficiency 05/15/2017   Past Medical History:  Diagnosis Date   Allergy    Anxiety    Asthma 2022   Breast cancer (HCC)    Cancer (HCC)    COPD (chronic obstructive pulmonary disease) (HCC)    Diverticula of colon    GERD (gastroesophageal reflux disease)    Heart murmur    Hyperlipidemia    Oxygen  deficiency     Personal history of radiation therapy    Renal insufficiency    2009 or 2010   Past Surgical History:  Procedure Laterality Date   APPENDECTOMY     AXILLARY SENTINEL NODE BIOPSY Left 08/06/2023   Procedure: AXILLARY SENTINEL NODE BIOPSY;  Surgeon: Flynn Hylan, MD;  Location: [facility];  Service: General;  Laterality: Left;   BREAST BIOPSY Left 07/02/2023   US  LT BREAST BX W LOC DEV 1ST LESION IMG BX SPEC US  GUIDE 07/02/2023 [facility]   BREAST LUMPECTOMY Left 08/06/2023   BREAST LUMPECTOMY WITH RADIOFREQUENCY TAG IDENTIFICATION Left 08/06/2023   Procedure: BREAST LUMPECTOMY WITH RADIOFREQUENCY TAG IDENTIFICATION;  Surgeon: Flynn Hylan, MD;  Location: [facility];  Service: General;  Laterality: Left;   CESAREAN SECTION     ECTOPIC PREGNANCY SURGERY     TUBAL LIGATION     Family History  Problem Relation Age of Onset   Breast cancer Paternal Aunt    Heart attack Mother    Outpatient Medications Prior to Visit  Medication Sig Dispense Refill   acetaminophen  (TYLENOL ) 500 MG tablet Take 1,000 mg by mouth every 6 (six) hours as needed.     aspirin  EC 81 MG tablet Take 1 tablet (81 mg total) by mouth daily. Swallow whole.     budesonide  (PULMICORT ) 0.5 MG/2ML nebulizer solution Take 2 mLs by nebulization 2 (two) times daily.     ezetimibe  (ZETIA ) 10 MG tablet Take 1 tablet (10 mg total) by mouth daily. 90 tablet 3   formoterol  (PERFOROMIST ) 20 MCG/2ML nebulizer solution INHALE 2 ML (20 MCG TOTAL)  BY NEBULIZATION TWO (2) TIMES A DAY.     ipratropium (ATROVENT ) 0.02 % nebulizer solution Take 2.5 mLs (0.5 mg total) by nebulization 4 (four) times daily. 75 mL 3   letrozole  (FEMARA ) 2.5 MG tablet Take 1 tablet (2.5 mg total) by mouth daily. Start after radiation is complete 30 tablet 6   levalbuterol  (XOPENEX  HFA) 45 MCG/ACT inhaler INHALE 1 PUFF INTO THE LUNGS EVERY 6 HOURS AS NEEDED FOR WHEEZING. 30 each 2   omeprazole  (PRILOSEC) 20 MG capsule Take 1 capsule (20 mg total) by mouth  daily. 30 capsule 2   OXYGEN  Inhale into the lungs. 2 litre at night     predniSONE  (DELTASONE ) 10 MG tablet Take 10 mg by mouth daily.     sodium chloride  HYPERTONIC 3 % nebulizer solution Take 4 mLs by nebulization as needed for other. 750 mL 1   traZODone  (DESYREL ) 100 MG tablet Take 1 tablet (100 mg total) by mouth at bedtime as needed. for sleep 30 tablet 2   LORazepam  (ATIVAN ) 0.5 MG tablet TAKE 0.5-1 TABLETS (0.25-0.5 MG TOTAL) BY MOUTH AS NEEDED FOR ANXIETY (FOR BREAKTHROUGH ANXIETY). 60 tablet 0   promethazine -dextromethorphan (PROMETHAZINE -DM) 6.25-15 MG/5ML syrup Take 5 mLs by mouth 4 (four) times daily as needed. 118 mL 0   No facility-administered medications prior to visit.   Allergies  Allergen Reactions   Penicillins Nausea And Vomiting and Other (See Comments)    Has patient had a PCN reaction causing immediate rash, facial/tongue/throat swelling, SOB or lightheadedness with hypotension: No Has patient had a PCN reaction causing severe rash involving mucus membranes or skin necrosis: No Has patient had a PCN reaction that required hospitalization: No Has patient had a PCN reaction occurring within the last 10 years: Yes If all of the above answers are "NO", then may proceed with Cephalosporin use.    Shellfish Allergy Nausea And Vomiting   Sulfa Antibiotics Rash and Other (See Comments)    Other reaction(s): Unknown   Clarithromycin Rash and Nausea Only    rash   Amoxicillin-Pot Clavulanate Nausea And Vomiting   Iodinated Contrast Media Other (See Comments)    acute renal insufficiency acute renal insufficiency   Atorvastatin  Rash   Macrobid [Nitrofurantoin] Rash   ROS: A complete ROS was performed with pertinent positives/negatives noted in the HPI. The remainder of the ROS are negative.    Objective:   Today's Vitals   04/13/24 1108  BP: 111/69  Pulse: 85  Temp: 98.2 F (36.8 C)  TempSrc: Oral  SpO2: 97%  Weight: 155 lb (70.3 kg)   Physical Exam Vitals  reviewed.  Constitutional:      Appearance: Normal appearance.  Cardiovascular:     Rate and Rhythm: Normal rate and regular rhythm.     Pulses: Normal pulses.     Heart sounds: Normal heart sounds.  Pulmonary:     Effort: Prolonged expiration (pursed lip breathing) present.     Breath sounds: Decreased air movement present. Examination of the right-upper field reveals wheezing. Examination of the left-upper field reveals wheezing. Examination of the right-middle field reveals wheezing. Examination of the left-middle field reveals wheezing. Examination of the right-lower field reveals wheezing and rales. Examination of the left-lower field reveals wheezing and rales. Decreased breath sounds, wheezing and rales present.  Neurological:     Mental Status: She is alert.  Psychiatric:        Mood and Affect: Mood normal.        Behavior: Behavior normal.  Assessment & Plan:   1. Pneumonia due to COVID-19 virus (Primary) Review of chart- CXR from 04/10/2024 showed "opacities of the right lung as can be seen with multifocal bronchopneumonia." Vital signs reviewed- afebrile and oxygen  saturation 97% on room air. Patient is well-appearing and in no acute distress. Physical exam with expiratory wheezing present in all lung fields. Localized rales present posteriorly in lower lobes. Cough present with deep inhalation, sounding productive. Discussed symptomatic treatment of headache and fever/myalgias. Advised patient to stay hydrated and use OTC cough medications. Counseled patient about when to seek emergency care. Would be reasonable to repeat CXR in 6-8 weeks to ensure resolution. Patient verbalized an understanding and all questions answered.  - levofloxacin  (LEVAQUIN ) 750 MG tablet; Take 1 tablet (750 mg total) by mouth daily for 5 days.  Dispense: 5 tablet; Refill: 0  2. Generalized anxiety disorder PDMP reviewed, no red flags present. Rx sent to pharmacy on file.  - LORazepam  (ATIVAN ) 0.5 MG  tablet; Take 0.5-1 tablets (0.25-0.5 mg total) by mouth as needed for anxiety (for breakthrough anxiety).  Dispense: 30 tablet; Refill: 0  Meds ordered this encounter  Medications   levofloxacin  (LEVAQUIN ) 750 MG tablet    Sig: Take 1 tablet (750 mg total) by mouth daily for 5 days.    Dispense:  5 tablet    Refill:  0    Supervising Provider:   ZIGLAR, SUSAN K [[id number]]   LORazepam  (ATIVAN ) 0.5 MG tablet    Sig: Take 0.5-1 tablets (0.25-0.5 mg total) by mouth as needed for anxiety (for breakthrough anxiety).    Dispense:  30 tablet    Refill:  0    Not to exceed 4 additional fills before 05/30/2024    Supervising Provider:   ZIGLAR, SUSAN K [[id number]]    Return if symptoms worsen or fail to improve.   Patient to reach out to office if new, worrisome, or unresolved symptoms arise or if no improvement in patient's condition. Patient verbalized understanding and is agreeable to treatment plan. All questions answered to patient's satisfaction.    Wilhelmena Hanson, FNP

## 2024-04-13 NOTE — Patient Instructions (Addendum)
 COVID-19 positive:  Influenza positive:  Stay home and away from others until symptoms are improving and fever free for 24 hours without the use of tylenol  or ibuprofen . If symptoms have improved and fever resolved, then can be in public setting, but should wear a mask around others, practice safe distancing, and perform hand hygiene for an additional 5 days. If symptoms worsen, or the patient develop shortness of breath, pulse oximeter reading of < 90%, or chest pain, seek immediate care at nearest emergency department or call 911.   Vitamin Regimen:  Vitamin C 500mg  twice daily  Vitamin D  5000 units once daily  Zinc  50-75mg  once daily   Over the counter Medications (*unless allergic or contraindicated*):  Use Tylenol  (acetaminophen ) for fever/pain Use Advil  (ibuprofen ) for fever/pain/inflammation  *Can rotate them   Non-Medication Therapy:  Drink plenty of fluids and stay hydrated.  A teaspoon of honey may help ease coughing symptoms.  Cough drops or hard candy for coughing.   Over the Counter Medication Therapy:  Use a cough expectorant such as guaifenesin  (Mucinex ) if recommended by your doctor for a wet, congested cough. If you have high blood pressure, please ask your doctor first before using this.  Use a cough suppressant such as dextromethorphan (Robitussin/Delsym) for a dry cough. If you have high blood pressure, please ask your doctor first before using this.  If you have high blood pressure, medication such as Coricidin HBP is safe to take for your cough and will not increase your blood pressure.

## 2024-04-26 ENCOUNTER — Ambulatory Visit: Attending: Cardiology

## 2024-04-26 DIAGNOSIS — I2510 Atherosclerotic heart disease of native coronary artery without angina pectoris: Secondary | ICD-10-CM

## 2024-04-26 DIAGNOSIS — E782 Mixed hyperlipidemia: Secondary | ICD-10-CM

## 2024-04-26 LAB — ECHOCARDIOGRAM COMPLETE
AR max vel: 2.09 cm2
AV Area VTI: 2.22 cm2
AV Area mean vel: 2.1 cm2
AV Mean grad: 8.5 mmHg
AV Peak grad: 16.5 mmHg
Ao pk vel: 2.03 m/s
Area-P 1/2: 3.72 cm2
S' Lateral: 2.55 cm

## 2024-04-27 ENCOUNTER — Other Ambulatory Visit: Payer: Self-pay

## 2024-04-27 ENCOUNTER — Ambulatory Visit: Payer: Self-pay | Admitting: Cardiology

## 2024-04-27 DIAGNOSIS — C50912 Malignant neoplasm of unspecified site of left female breast: Secondary | ICD-10-CM

## 2024-05-11 ENCOUNTER — Other Ambulatory Visit: Payer: Self-pay | Admitting: Family Medicine

## 2024-05-11 DIAGNOSIS — F411 Generalized anxiety disorder: Secondary | ICD-10-CM

## 2024-05-14 ENCOUNTER — Encounter: Payer: Self-pay | Admitting: Family Medicine

## 2024-05-17 ENCOUNTER — Ambulatory Visit: Admitting: Oncology

## 2024-05-19 ENCOUNTER — Ambulatory Visit
Admission: RE | Admit: 2024-05-19 | Discharge: 2024-05-19 | Disposition: A | Discharge status: Home / Self Care | Payer: Medicare Other | Source: Ambulatory Visit | Attending: Radiation Oncology | Admitting: Radiation Oncology

## 2024-05-19 ENCOUNTER — Encounter: Payer: Self-pay | Admitting: Oncology

## 2024-05-19 ENCOUNTER — Inpatient Hospital Stay: Attending: Oncology | Admitting: Oncology

## 2024-05-19 ENCOUNTER — Encounter: Payer: Self-pay | Admitting: Radiation Oncology

## 2024-05-19 VITALS — BP 122/76 | HR 87 | Temp 98.6°F | Resp 20 | Ht 62.0 in | Wt 152.0 lb

## 2024-05-19 VITALS — BP 122/76 | HR 87 | Temp 98.0°F | Resp 20

## 2024-05-19 DIAGNOSIS — Z803 Family history of malignant neoplasm of breast: Secondary | ICD-10-CM | POA: Insufficient documentation

## 2024-05-19 DIAGNOSIS — Z79811 Long term (current) use of aromatase inhibitors: Secondary | ICD-10-CM | POA: Insufficient documentation

## 2024-05-19 DIAGNOSIS — C50912 Malignant neoplasm of unspecified site of left female breast: Secondary | ICD-10-CM

## 2024-05-19 DIAGNOSIS — Z08 Encounter for follow-up examination after completed treatment for malignant neoplasm: Secondary | ICD-10-CM | POA: Diagnosis not present

## 2024-05-19 DIAGNOSIS — M85852 Other specified disorders of bone density and structure, left thigh: Secondary | ICD-10-CM | POA: Diagnosis not present

## 2024-05-19 DIAGNOSIS — Z1721 Progesterone receptor positive status: Secondary | ICD-10-CM | POA: Diagnosis not present

## 2024-05-19 DIAGNOSIS — Z170 Estrogen receptor positive status [ER+]: Secondary | ICD-10-CM | POA: Insufficient documentation

## 2024-05-19 DIAGNOSIS — Z853 Personal history of malignant neoplasm of breast: Secondary | ICD-10-CM

## 2024-05-19 DIAGNOSIS — C50412 Malignant neoplasm of upper-outer quadrant of left female breast: Secondary | ICD-10-CM | POA: Insufficient documentation

## 2024-05-19 DIAGNOSIS — Z1732 Human epidermal growth factor receptor 2 negative status: Secondary | ICD-10-CM | POA: Diagnosis not present

## 2024-05-19 DIAGNOSIS — Z923 Personal history of irradiation: Secondary | ICD-10-CM | POA: Insufficient documentation

## 2024-05-19 DIAGNOSIS — F17210 Nicotine dependence, cigarettes, uncomplicated: Secondary | ICD-10-CM | POA: Insufficient documentation

## 2024-05-19 DIAGNOSIS — Z79899 Other long term (current) drug therapy: Secondary | ICD-10-CM | POA: Insufficient documentation

## 2024-05-19 NOTE — Progress Notes (Signed)
 Radiation Oncology Follow up Note  Name: Stephanie Hess   Date:   05/19/2024 MRN:  [id number] DOB: 11-04-1951    This 73 y.o. 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  REFERRING PROVIDER: Wallace Hess LABOR, PA  HPI: Patient is a 73 year old female now out 7 months having completed whole breast radiation to her left breast for stage Ib ER/PR positive invasive mammary carcinoma seen today in routine follow-up she is doing well specifically denies any breast tenderness cough or bone pain..  She had mammograms back in March which were BI-RADS 2 benign which I have reviewed.  She is currently on Femara  tolerating it well without side effect.  COMPLICATIONS OF TREATMENT: none  FOLLOW UP COMPLIANCE: keeps appointments   PHYSICAL EXAM:  BP 122/76   Pulse 87   Temp 98 F (36.7 C)   Resp 20  Lungs are clear to A&P cardiac examination essentially unremarkable with regular rate and rhythm. No dominant mass or nodularity is noted in either breast in 2 positions examined. Incision is well-healed. No axillary or supraclavicular adenopathy is appreciated. Cosmetic result is excellent.  Well-developed well-nourished patient in NAD. HEENT reveals PERLA, EOMI, discs not visualized.  Oral cavity is clear. No oral mucosal lesions are identified. Neck is clear without evidence of cervical or supraclavicular adenopathy. Lungs are clear to A&P. Cardiac examination is essentially unremarkable with regular rate and rhythm without murmur rub or thrill. Abdomen is benign with no organomegaly or masses noted. Motor sensory and DTR levels are equal and symmetric in the upper and lower extremities. Cranial nerves II through XII are grossly intact. Proprioception is intact. No peripheral adenopathy or edema is identified. No motor or sensory levels  are noted. Crude visual fields are within normal range.  RADIOLOGY RESULTS: Mammograms reviewed compatible with above-stated findings  PLAN: The present time patient is doing well now out 7 months with no evidence of disease.  And pleased with her overall progress.  At this time I am turning follow-up care over to her surgeon as well as medical oncology.  I be happy to reevaluate the patient anytime should that be indicated.  Patient knows to call with any concerns.  I would like to take this opportunity to thank you for allowing me to participate in the care of your patient.Stephanie Marcey Penton, MD

## 2024-05-19 NOTE — Progress Notes (Signed)
 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  This was followed by a diagnostic mammogram which showed a 7 x 5 x 6 mm mass at the 12 o'clock position of the left breast 5 cm from the nipple.  No suspicious left axillary lymph nodes.  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 Family History significant for paternal aunt with breast cancer..  Menarche at the age of 38.  G4, P2.  No prior abnormal breast biopsies. MRI bilateral breast showed 1.2 cm enhancing mass in the left breast and 4 mm indeterminate mass in the inferior left breast.  The 4 mm mass was biopsied and was negative for malignancy            ```   Lumpectomy pathology from 08/06/2023 showed 0.9 cm invasive carcinoma with mixed lobular and ductal features with negative margins.  No evidence of lymphovascular invasion.  Grade 2.  1 sentinel lymph node negative for malignancy.  Oncotype testing showed intermediate risk for of 14 and given her age she does not require any adjuvant chemotherapy for this score.  She completed adjuvant radiation  therapy and started taking letrozole  in January 2025.  Patient has baseline osteopenia with a T-score of -1.5 in the left femur neck    Interval history-patient was admitted to [facility] in March 2025 for acute hypoxic respiratory failure secondary to pneumonia and rhinovirus infection and was also in the ER in May 2025 for similar complaints.  She feels she is gradually recovering from her pneumonia.  She has taken her letrozole  consistently.  She has not been using calcium  or vitamin D .  ECOG PS- 1 Pain scale- 3   Review of systems- Review of Systems  Constitutional:  Positive for malaise/fatigue. Negative for chills, fever and weight loss.  HENT:  Negative for congestion, ear discharge and nosebleeds.   Eyes:  Negative for blurred vision.  Respiratory:  Negative for cough, hemoptysis, sputum production, shortness of breath and wheezing.   Cardiovascular:  Negative for chest pain, palpitations, orthopnea and claudication.  Gastrointestinal:  Negative for abdominal pain, blood in stool, constipation, diarrhea, heartburn, melena, nausea and vomiting.  Genitourinary:  Negative for dysuria, flank pain, frequency, hematuria and urgency.  Musculoskeletal:  Negative for back pain, joint pain and myalgias.  Skin:  Negative for rash.  Neurological:  Negative for dizziness, tingling, focal weakness, seizures, weakness and headaches.  Endo/Heme/Allergies:  Does not bruise/bleed easily.  Psychiatric/Behavioral:  Negative for depression and suicidal ideas. The patient does not have insomnia.       Allergies  Allergen Reactions   Penicillins Nausea And Vomiting and Other (See Comments)    Has patient had  a PCN reaction causing immediate rash, facial/tongue/throat swelling, SOB or lightheadedness with hypotension: No Has patient had a PCN reaction causing severe rash involving mucus membranes or skin necrosis: No Has patient had a PCN reaction that required hospitalization: No Has patient had a PCN  reaction occurring within the last 10 years: Yes If all of the above answers are NO, then may proceed with Cephalosporin use.    Shellfish Allergy Nausea And Vomiting   Sulfa Antibiotics Rash and Other (See Comments)    Other reaction(s): Unknown   Clarithromycin Rash and Nausea Only    rash   Amoxicillin-Pot Clavulanate Nausea And Vomiting   Iodinated Contrast Media Other (See Comments)    acute renal insufficiency acute renal insufficiency   Atorvastatin  Rash   Macrobid [Nitrofurantoin] Rash     Past Medical History:  Diagnosis Date   Allergy    Anxiety    Asthma 2022   Breast cancer (HCC)    Cancer (HCC)    COPD (chronic obstructive pulmonary disease) (HCC)    Diverticula of colon    GERD (gastroesophageal reflux disease)    Heart murmur    Hyperlipidemia    Oxygen  deficiency    Personal history of radiation therapy    Renal insufficiency    2009 or 2010     Past Surgical History:  Procedure Laterality Date   APPENDECTOMY     AXILLARY SENTINEL NODE BIOPSY Left 08/06/2023   Procedure: AXILLARY SENTINEL NODE BIOPSY;  Surgeon: Lane Shope, MD;  Location: [facility] ORS;  Service: General;  Laterality: Left;   BREAST BIOPSY Left 07/02/2023   US  LT BREAST BX W LOC DEV 1ST LESION IMG BX SPEC US  GUIDE 07/02/2023 [facility]   BREAST LUMPECTOMY Left 08/06/2023   BREAST LUMPECTOMY WITH RADIOFREQUENCY TAG IDENTIFICATION Left 08/06/2023   Procedure: BREAST LUMPECTOMY WITH RADIOFREQUENCY TAG IDENTIFICATION;  Surgeon: Lane Shope, MD;  Location: [facility] ORS;  Service: General;  Laterality: Left;   CESAREAN SECTION     ECTOPIC PREGNANCY SURGERY     TUBAL LIGATION      Social History   Socioeconomic History   Marital status: Divorced    Spouse name: Not on file   Number of children: Not on file   Years of education: Not on file   Highest education level: 12th grade  Occupational History   Not on file  Tobacco Use   Smoking status: Every Day    Current  packs/day: 0.50    Average packs/day: 0.5 packs/day for 50.0 years (25.0 ttl pk-yrs)    Types: Cigarettes    Passive exposure: Past   Smokeless tobacco: Never  Vaping Use   Vaping status: Never Used  Substance and Sexual Activity   Alcohol use: Yes    Comment: social   Drug use: No   Sexual activity: Not Currently  Other Topics Concern   Not on file  Social History Narrative   Not on file   Social Drivers of Health   Financial Resource Strain: Medium Risk (12/17/2023)   Overall Financial Resource Strain (CARDIA)    Difficulty of Paying Living Expenses: Somewhat hard  Food Insecurity: No Food Insecurity (02/12/2024)   Hunger Vital Sign    Worried About Running Out of Food in the Last Year: Never true    Ran Out of Food in the Last Year: Never true  Transportation Needs: No Transportation Needs (02/12/2024)   PRAPARE - Administrator, Civil Service (Medical): No    Lack of Transportation (Non-Medical):  No  Physical Activity: Inactive (12/17/2023)   Exercise Vital Sign    Days of Exercise per Week: 0 days    Minutes of Exercise per Session: 0 min  Stress: No Stress Concern Present (12/17/2023)   [employer] of Occupational Health - Occupational Stress Questionnaire    Feeling of Stress : Only a little  Social Connections: Socially Isolated (12/17/2023)   Social Connection and Isolation Panel    Frequency of Communication with Friends and Family: More than three times a week    Frequency of Social Gatherings with Friends and Family: Once a week    Attends Religious Services: Never    Database administrator or Organizations: No    Attends Banker Meetings: Never    Marital Status: Divorced  Catering manager Violence: Not At Risk (02/12/2024)   Humiliation, Afraid, Rape, and Kick questionnaire    Fear of Current or Ex-Partner: No    Emotionally Abused: No    Physically Abused: No    Sexually Abused: No    Family History  Problem Relation Age of Onset    Breast cancer Paternal Aunt    Heart attack Mother      Current Outpatient Medications:    acetaminophen  (TYLENOL ) 500 MG tablet, Take 1,000 mg by mouth every 6 (six) hours as needed., Disp: , Rfl:    aspirin  EC 81 MG tablet, Take 1 tablet (81 mg total) by mouth daily. Swallow whole., Disp: , Rfl:    budesonide  (PULMICORT ) 0.5 MG/2ML nebulizer solution, Take 2 mLs by nebulization 2 (two) times daily., Disp: , Rfl:    ezetimibe  (ZETIA ) 10 MG tablet, Take 1 tablet (10 mg total) by mouth daily., Disp: 90 tablet, Rfl: 3   formoterol  (PERFOROMIST ) 20 MCG/2ML nebulizer solution, INHALE 2 ML (20 MCG TOTAL) BY NEBULIZATION TWO (2) TIMES A DAY., Disp: , Rfl:    ipratropium (ATROVENT ) 0.02 % nebulizer solution, Take 2.5 mLs (0.5 mg total) by nebulization 4 (four) times daily., Disp: 75 mL, Rfl: 3   letrozole  (FEMARA ) 2.5 MG tablet, Take 1 tablet (2.5 mg total) by mouth daily. Start after radiation is complete, Disp: 30 tablet, Rfl: 6   levalbuterol  (XOPENEX  HFA) 45 MCG/ACT inhaler, INHALE 1 PUFF INTO THE LUNGS EVERY 6 HOURS AS NEEDED FOR WHEEZING., Disp: 30 each, Rfl: 2   LORazepam  (ATIVAN ) 0.5 MG tablet, Take 0.5-1 tablets (0.25-0.5 mg total) by mouth as needed for anxiety (for breakthrough anxiety)., Disp: 30 tablet, Rfl: 0   omeprazole  (PRILOSEC) 20 MG capsule, Take 1 capsule (20 mg total) by mouth daily., Disp: 30 capsule, Rfl: 2   OXYGEN , Inhale into the lungs. 2 litre at night, Disp: , Rfl:    predniSONE  (DELTASONE ) 10 MG tablet, Take 10 mg by mouth daily., Disp: , Rfl:    sodium chloride  HYPERTONIC 3 % nebulizer solution, Take 4 mLs by nebulization as needed for other., Disp: 750 mL, Rfl: 1   traZODone  (DESYREL ) 100 MG tablet, Take 1 tablet (100 mg total) by mouth at bedtime as needed. for sleep, Disp: 30 tablet, Rfl: 2  Physical exam:  Vitals:   05/19/24 1127  BP: 122/76  Pulse: 87  Resp: 20  Temp: 98.6 F (37 C)  TempSrc: Tympanic  SpO2: 100%  Weight: 152 lb (68.9 kg)  Height: 5' 2  (1.575 m)   Physical Exam Cardiovascular:     Rate and Rhythm: Normal rate and regular rhythm.     Heart sounds: Normal heart sounds.  Pulmonary:  Effort: Pulmonary effort is normal.     Breath sounds: Normal breath sounds.  Abdominal:     General: Bowel sounds are normal.     Palpations: Abdomen is soft.  Skin:    General: Skin is warm and dry.  Neurological:     Mental Status: She is alert and oriented to person, place, and time.    Breast exam was performed in seated and lying down position. Patient is status post left lumpectomy with a well-healed surgical scar. No evidence of any palpable masses. No evidence of axillary adenopathy. No evidence of any palpable masses or lumps in the right breast. No evidence of right axillary adenopathy   I have personally reviewed labs listed below:    Latest Ref Rng & Units 01/14/2024   11:09 AM  CMP  Glucose 70 - 99 mg/dL 95   BUN 8 - 23 mg/dL 9   Creatinine 9.55 - 8.99 mg/dL 9.10   Sodium 864 - 854 mmol/L 131   Potassium 3.5 - 5.1 mmol/L 3.9   Chloride 98 - 111 mmol/L 98   CO2 22 - 32 mmol/L 22   Calcium  8.9 - 10.3 mg/dL 9.1   Total Protein 6.5 - 8.1 g/dL 7.6   Total Bilirubin 0.0 - 1.2 mg/dL 0.6   Alkaline Phos 38 - 126 U/L 53   AST 15 - 41 U/L 26   ALT 0 - 44 U/L 22       Latest Ref Rng & Units 12/19/2023    8:56 AM  CBC  WBC 3.4 - 10.8 x10E3/uL 14.0   Hemoglobin 11.1 - 15.9 g/dL 87.2   Hematocrit 65.9 - 46.6 % 38.9   Platelets 150 - 450 x10E3/uL 320    I have personally reviewed Radiology images listed below: No images are attached to the encounter.  ECHOCARDIOGRAM COMPLETE Result Date: 04/26/2024    ECHOCARDIOGRAM REPORT   Patient Name:   KAYLEEN ALIG Date of Exam: 04/26/2024 Medical Rec #:  [id number]       Height:       62.0 in Accession #:    [id number]      Weight:       155.0 lb Date of Birth:  1950-12-09      BSA:          1.715 m Patient Age:    72 years        BP:           111/69 mmHg Patient Gender: F                HR:           82 bpm. Exam Location:  [place] Procedure: 2D Echo, Cardiac Doppler, Color Doppler and Strain Analysis (Both            Spectral and Color Flow Doppler were utilized during procedure). Indications:    I25.110 Atherosclerotic heart disease of native coronary artery                 with unstable angina pectoris  History:        Patient has no prior history of Echocardiogram examinations.                 CAD, COPD, Signs/Symptoms:Shortness of Breath, Murmur and                 Dyspnea; Risk Factors:Current Smoker and Dyslipidemia.  Sonographer:    Doyal Point MHA, BS, RDCS Referring Phys: [phone] [facility]  [facility]  Sonographer Comments: Image acquisition challenging due to COPD. IMPRESSIONS  1. Left ventricular ejection fraction, by estimation, is 60 to 65%. The left ventricle has normal function. The left ventricle has no regional wall motion abnormalities. Left ventricular diastolic parameters are consistent with Grade I diastolic dysfunction (impaired relaxation). The average left ventricular global longitudinal strain is -14.4 %. The global longitudinal strain is abnormal.  2. Right ventricular systolic function is normal. The right ventricular size is normal.  3. The mitral valve is normal in structure. No evidence of mitral valve regurgitation.  4. The aortic valve is calcified. Aortic valve regurgitation is not visualized. Aortic valve sclerosis/calcification is present, without any evidence of aortic stenosis. Aortic valve mean gradient measures 8.5 mmHg.  5. The inferior vena cava is normal in size with greater than 50% respiratory variability, suggesting right atrial pressure of 3 mmHg. FINDINGS  Left Ventricle: Left ventricular ejection fraction, by estimation, is 60 to 65%. The left ventricle has normal function. The left ventricle has no regional wall motion abnormalities. The average left ventricular global longitudinal strain is -14.4 %. Strain was performed and the global  longitudinal strain is abnormal. The left ventricular internal cavity size was normal in size. There is no left ventricular hypertrophy. Left ventricular diastolic parameters are consistent with Grade I diastolic dysfunction (impaired relaxation). Right Ventricle: The right ventricular size is normal. No increase in right ventricular wall thickness. Right ventricular systolic function is normal. Left Atrium: Left atrial size was normal in size. Right Atrium: Right atrial size was normal in size. Pericardium: There is no evidence of pericardial effusion. Mitral Valve: The mitral valve is normal in structure. No evidence of mitral valve regurgitation. Tricuspid Valve: The tricuspid valve is normal in structure. Tricuspid valve regurgitation is mild. Aortic Valve: The aortic valve is calcified. Aortic valve regurgitation is not visualized. Aortic valve sclerosis/calcification is present, without any evidence of aortic stenosis. Aortic valve mean gradient measures 8.5 mmHg. Aortic valve peak gradient measures 16.5 mmHg. Aortic valve area, by VTI measures 2.22 cm. Pulmonic Valve: The pulmonic valve was normal in structure. Pulmonic valve regurgitation is not visualized. Aorta: The aortic root and ascending aorta are structurally normal, with no evidence of dilitation. Venous: The inferior vena cava is normal in size with greater than 50% respiratory variability, suggesting right atrial pressure of 3 mmHg. IAS/Shunts: No atrial level shunt detected by color flow Doppler.  LEFT VENTRICLE PLAX 2D LVIDd:         3.77 cm   Diastology LVIDs:         2.55 cm   LV e' medial:    7.62 cm/s LV PW:         1.10 cm   LV E/e' medial:  11.1 LV IVS:        1.14 cm   LV e' lateral:   9.68 cm/s LVOT diam:     1.80 cm   LV E/e' lateral: 8.7 LV SV:         87 LV SV Index:   51        2D Longitudinal Strain LVOT Area:     2.54 cm  2D Strain GLS Avg:     -14.4 %  RIGHT VENTRICLE RV Basal diam:  2.57 cm RV Mid diam:    2.46 cm RV S prime:      15.30 cm/s TAPSE (M-mode): 1.9 cm LEFT ATRIUM             Index  RIGHT ATRIUM          Index LA diam:        4.10 cm 2.39 cm/m   RA Area:     8.24 cm LA Vol (A2C):   43.4 ml 25.30 ml/m  RA Volume:   15.80 ml 9.21 ml/m LA Vol (A4C):   40.0 ml 23.32 ml/m LA Biplane Vol: 41.9 ml 24.43 ml/m  AORTIC VALVE AV Area (Vmax):    2.09 cm AV Area (Vmean):   2.10 cm AV Area (VTI):     2.22 cm AV Vmax:           203.00 cm/s AV Vmean:          136.000 cm/s AV VTI:            0.392 m AV Peak Grad:      16.5 mmHg AV Mean Grad:      8.5 mmHg LVOT Vmax:         167.00 cm/s LVOT Vmean:        112.000 cm/s LVOT VTI:          0.342 m LVOT/AV VTI ratio: 0.87  AORTA Ao Sinus diam: 2.82 cm Ao Asc diam:   2.70 cm MITRAL VALVE MV Area (PHT): 3.72 cm     SHUNTS MV Decel Time: 204 msec     Systemic VTI:  0.34 m MV E velocity: 84.40 cm/s   Systemic Diam: 1.80 cm MV A velocity: 116.00 cm/s MV E/A ratio:  0.73 Redell Cave MD Electronically signed by Redell Cave MD Signature Date/Time: 04/26/2024/4:04:58 PM    Final      Assessment and plan- Patient is a 73 y.o. female  with pathological prognostic stage Ia invasive carcinoma of the left breast with mixed ductal and lobular features pT1b N0 M0 ER/PR positive HER2 negative.  She is here for routine follow-up of breast cancer  Clinically patient is doing well with no concerning signs and symptoms of recurrence based on today's exam.  She is already scheduled for mammogram in August 2025.  She will continue to take letrozole  at this time and I have encouraged her to take calcium  1200 mg along with vitamin D  800 international units given that she has baseline osteopenia.  I will see her back in 6 months no labs   Visit Diagnosis 1. Encounter for follow-up surveillance of breast cancer   2. Use of letrozole  (Femara )   3. Osteopenia of neck of left femur      Dr. Annah Skene, MD, MPH [facility] [phone] 05/19/2024 8:15  AM

## 2024-05-23 ENCOUNTER — Encounter: Payer: Self-pay | Admitting: Family Medicine

## 2024-05-23 ENCOUNTER — Other Ambulatory Visit: Payer: Self-pay | Admitting: Family Medicine

## 2024-05-23 DIAGNOSIS — F411 Generalized anxiety disorder: Secondary | ICD-10-CM

## 2024-05-23 MED ORDER — LORAZEPAM 0.5 MG PO TABS: 0.2500 mg | ORAL_TABLET | ORAL | 0 refills | Status: DC | PRN

## 2024-05-23 NOTE — Progress Notes (Signed)
 PDMP reviewed, no red flags. Rx sent to pharmacy on file for lorazepam .

## 2024-05-24 ENCOUNTER — Encounter: Payer: Self-pay | Admitting: Family Medicine

## 2024-05-24 ENCOUNTER — Ambulatory Visit (INDEPENDENT_AMBULATORY_CARE_PROVIDER_SITE_OTHER): Admitting: Family Medicine

## 2024-05-24 VITALS — BP 122/80 | HR 98 | Resp 15 | Ht 62.0 in | Wt 154.0 lb

## 2024-05-24 DIAGNOSIS — F411 Generalized anxiety disorder: Secondary | ICD-10-CM

## 2024-05-24 DIAGNOSIS — Z720 Tobacco use: Secondary | ICD-10-CM | POA: Diagnosis not present

## 2024-05-24 DIAGNOSIS — J439 Emphysema, unspecified: Secondary | ICD-10-CM

## 2024-05-24 DIAGNOSIS — Z0000 Encounter for general adult medical examination without abnormal findings: Secondary | ICD-10-CM

## 2024-05-24 NOTE — Patient Instructions (Signed)
 Smoking Cessation: QuitlineNC 1-800-QUIT-NOW [phone]); Espaol: 1-855-Djelo-Ya ([phone]) [email or web address]

## 2024-05-24 NOTE — Progress Notes (Signed)
 Established Patient Office Visit  Subjective  Patient ID: Stephanie Hess, female    DOB: [birth year]  Age: 73 y.o. MRN: [id number]  Chief Complaint  Patient presents with   Follow-up   MOOD: Stephanie Hess is a pleasant 73 year old female patient who presents for the medical management of anxiety.  Current medication regimen: lorazepam  Reports she did not tolerate Zoloft , it made her feel terrible. She reports that her breathing worsened with this medication.  Denies SI/HI.      05/24/2024   10:32 AM 04/13/2024   11:10 AM 03/23/2024    8:40 AM 02/18/2024    9:19 AM  GAD 7 : Generalized Anxiety Score  Nervous, Anxious, on Edge 2 1 0 0  Control/stop worrying 1 1 0 0  Worry too much - different things 1 1 0 0  Trouble relaxing 1 3 0 0  Restless 0 0 0 0  Easily annoyed or irritable 0 1 0 0  Afraid - awful might happen 1 1 0 0  Total GAD 7 Score 6 8 0 0  Anxiety Difficulty Not difficult at all Somewhat difficult Not difficult at all Not difficult at all      05/24/2024   10:33 AM 05/19/2024   11:02 AM 04/13/2024   11:10 AM  PHQ9 SCORE ONLY  PHQ-9 Total Score 9 0 9   COPD: She was recently diagnosed with COVID in May 2025 Has more energy  There are days it is good Currently Atrovent , budesonide  and formoterol , ipratropium neb  Q6 months with pulmonology  Has not needed to use O2 during the day  Smoking 5-8 cigarettes per day   ROS: see HPI     Objective:    BP 122/80   Pulse 98   Resp 15   Ht 5' 2 (1.575 m)   Wt 154 lb (69.9 kg)   SpO2 94%   BMI 28.17 kg/m  BP Readings from Last 3 Encounters:  05/24/24 122/80  05/19/24 122/76  05/19/24 122/76    Physical Exam Vitals reviewed.  Constitutional:      Appearance: Normal appearance.  Cardiovascular:     Rate and Rhythm: Normal rate and regular rhythm.     Pulses: Normal pulses.     Heart sounds: Normal heart sounds.  Pulmonary:     Effort: Tachypnea and prolonged expiration present. No respiratory distress  or retractions.     Breath sounds: Examination of the right-upper field reveals wheezing. Examination of the right-middle field reveals wheezing. Wheezing present. No decreased breath sounds, rhonchi or rales.  Neurological:     Mental Status: She is alert.  Psychiatric:        Mood and Affect: Mood normal.        Behavior: Behavior normal.     Assessment & Plan:   1. Generalized anxiety disorder (Primary) GAD7 completed with score of 6. Denies issues palpitations, hyperventilation, and dizziness. PHQ9 completed with score of 9. Denies active or passive suicidal ideations.  Discussed benefits of cognitive behavioral therapy (CBT) and patient will consider at this time. Refill sent yesterday for lorazepam . Patient reports this also helps improve her breathing. Plan to follow-up in 3 months.   2. Pulmonary emphysema, unspecified emphysema type [facility]) Patient is followed by [facility] pulmonology, Dr. Diana.  Her last office visit with them was 03/08/2024 and sees them about every 6 months.  She is currently taking her Atrovent  budesonide , and formoterol .  She has levalbuterol  to use PRN. She reports that she  did not like Trelegy or Breo history.  She does have a history of emphysema- also still currently taking daily prednisone  10mg  daily & azithromycin  250mg . Discussed smoking cessation and is not interested at quitting completely at this time.  - CT CHEST LUNG CANCER SCREENING LOW DOSE WO CONTRAST; Future  3. Nicotine  abuse Patient reports she is decreasing how much she is smoking and is currently down to 5-8 cigarettes per day. She is not interested in quitting at this time. Lung sounds decreased in all lobes posteriorly. Slight expiratory wheezing present in the left upper lobe. Oxygen  saturation 94% today on room air. Patient reports it took her awhile to recover from COVID in May but reports she is only using her oxygen  2L at night again. Patient is agreeable to CT scan for cancer screening. Last CTA  05/2022 stable reticulation and chronic airway inflammation- pulmonology is less concerned for active ILD. Advised patient to ensure she has pulmonology follow-up in Oct 2025.  - CT CHEST LUNG CANCER SCREENING LOW DOSE WO CONTRAST; Future  4. Wellness examination Plan for future CPE. Orders placed for fasting labs.  - CBC with Differential/Platelet; Future - Comprehensive metabolic panel with GFR; Future - Hemoglobin A1c; Future - Lipid panel; Future - TSH Rfx on Abnormal to Free T4; Future   Return in about 3 months (around 08/24/2024) for Physical with fasting labs.    [name], FNP

## 2024-05-28 DIAGNOSIS — M25559 Pain in unspecified hip: Secondary | ICD-10-CM | POA: Insufficient documentation

## 2024-06-02 ENCOUNTER — Telehealth: Payer: Self-pay

## 2024-06-02 NOTE — Telephone Encounter (Signed)
 [employer] Requires no PA for Lung CT Screening. Faxed information form to [facility] with Lung Cancer Screening Order Form.

## 2024-06-10 ENCOUNTER — Other Ambulatory Visit: Payer: Self-pay | Admitting: Oncology

## 2024-06-11 ENCOUNTER — Ambulatory Visit: Attending: Cardiology | Admitting: Cardiology

## 2024-06-11 ENCOUNTER — Encounter: Payer: Self-pay | Admitting: Cardiology

## 2024-06-11 ENCOUNTER — Telehealth: Payer: Self-pay | Admitting: Pharmacy Technician

## 2024-06-11 VITALS — BP 150/76 | HR 91 | Ht 62.0 in | Wt 151.2 lb

## 2024-06-11 DIAGNOSIS — E782 Mixed hyperlipidemia: Secondary | ICD-10-CM | POA: Diagnosis not present

## 2024-06-11 DIAGNOSIS — F17200 Nicotine dependence, unspecified, uncomplicated: Secondary | ICD-10-CM | POA: Diagnosis not present

## 2024-06-11 DIAGNOSIS — R030 Elevated blood-pressure reading, without diagnosis of hypertension: Secondary | ICD-10-CM | POA: Diagnosis not present

## 2024-06-11 DIAGNOSIS — I2510 Atherosclerotic heart disease of native coronary artery without angina pectoris: Secondary | ICD-10-CM

## 2024-06-11 MED ORDER — BEMPEDOIC ACID 180 MG PO TABS: 180.0000 mg | ORAL_TABLET | Freq: Every day | ORAL | 3 refills | Status: DC

## 2024-06-11 NOTE — Patient Instructions (Signed)
 Medication Instructions:  - START bempedoic acid 180 mg daily   *If you need a refill on your cardiac medications before your next appointment, please call your pharmacy*  Lab Work: Your provider would like for you to return in 3 months to have the following labs drawn: lipid panel.   Please go to [facility] [address] ([facility]) [address], [place] [address] You do not need an appointment.  They are open from 8 am- 4:30 pm.  Lunch from 1:00 pm- 2:00 pm You DO need to be fasting.  If you have labs (blood work) drawn today and your tests are completely normal, you will receive your results only by: MyChart Message (if you have MyChart) OR A paper copy in the mail If you have any lab test that is abnormal or we need to change your treatment, we will call you to review the results.  Testing/Procedures: No test ordered today   Follow-Up: At [facility], you and your health needs are our priority.  As part of our continuing mission to provide you with exceptional heart care, our providers are all part of one team.  This team includes your primary Cardiologist (physician) and Advanced Practice Providers or APPs (Physician Assistants and Nurse Practitioners) who all work together to provide you with the care you need, when you need it.  Your next appointment:   6 month(s)  Provider:   You may see Dr Darliss or one of the following Advanced Practice Providers on your designated Care Team:   Lonni Meager, NP Lesley Maffucci, PA-C Bernardino Bring, PA-C Cadence [place], PA-C Tylene Lunch, NP Barnie Hila, NP  We recommend signing up for the patient portal called MyChart.  Sign up information is provided on this After Visit Summary.  MyChart is used to connect with patients for Virtual Visits (Telemedicine).  Patients are able to view lab/test results, encounter notes, upcoming appointments, etc.  Non-urgent messages can be sent to your provider as  well.   To learn more about what you can do with MyChart, go to [email or web address].

## 2024-06-11 NOTE — Progress Notes (Signed)
 Cardiology Office Note:    Date:  06/11/2024   ID:  Stephanie Hess, DOB [birth year], MRN [id number]  PCP:  Stephanie Small, FNP   [facility] Providers Cardiologist:  None     Referring MD: Stephanie Small, FNP   Chief Complaint  Patient presents with   Follow-up    3 month follow up pt has been doing well with no complaints of chest pain, chest pressure or SOB, medciation reviewed verbally with patient    History of Present Illness:    Stephanie Hess is a 73 y.o. female with a hx of CAD  (LAD, RCA calcifications on chest CT), hyperlipidemia, GERD, current smoker x 50+ years, COPD who presents for follow-up.    Patient was last seen due to aortic atherosclerosis and coronary calcifications.  Echocardiogram obtained to evaluate cardiac function.  Patient still smokes.  Has no new concerns, denies chest pain or shortness of breath.  Did not tolerate Lipitor in the past, Zetia  started.  Patient states taking Zetia  causes congestion which improved whenever she stop Zetia .  Otherwise doing okay from cardiac perspective.  Prior notes/testing Chest CT 11/2020 LAD, RCA calcifications. Echo 2017 EF 55 to 60% Lexiscan Myoview  no evidence for ischemia  Past Medical History:  Diagnosis Date   Allergy    Anxiety    Asthma 2022   Breast cancer (HCC)    Cancer (HCC)    COPD (chronic obstructive pulmonary disease) (HCC)    Diverticula of colon    GERD (gastroesophageal reflux disease)    Heart murmur    Hyperlipidemia    Oxygen  deficiency    Personal history of radiation therapy    Renal insufficiency    2009 or 2010    Past Surgical History:  Procedure Laterality Date   APPENDECTOMY     AXILLARY SENTINEL NODE BIOPSY Left 08/06/2023   Procedure: AXILLARY SENTINEL NODE BIOPSY;  Surgeon: Stephanie Shope, MD;  Location: [facility] ORS;  Service: General;  Laterality: Left;   BREAST BIOPSY Left 07/02/2023   US  LT BREAST BX W LOC DEV 1ST LESION IMG BX SPEC US  GUIDE 07/02/2023  [facility]   BREAST LUMPECTOMY Left 08/06/2023   BREAST LUMPECTOMY WITH RADIOFREQUENCY TAG IDENTIFICATION Left 08/06/2023   Procedure: BREAST LUMPECTOMY WITH RADIOFREQUENCY TAG IDENTIFICATION;  Surgeon: Stephanie Shope, MD;  Location: [facility] ORS;  Service: General;  Laterality: Left;   CESAREAN SECTION     ECTOPIC PREGNANCY SURGERY     TUBAL LIGATION      Current Medications: Current Meds  Medication Sig   acetaminophen  (TYLENOL ) 500 MG tablet Take 1,000 mg by mouth every 6 (six) hours as needed.   aspirin  EC 81 MG tablet Take 1 tablet (81 mg total) by mouth daily. Swallow whole.   Bempedoic Acid 180 MG TABS Take 1 tablet (180 mg total) by mouth daily.   budesonide  (PULMICORT ) 0.5 MG/2ML nebulizer solution Take 2 mLs by nebulization 2 (two) times daily.   formoterol  (PERFOROMIST ) 20 MCG/2ML nebulizer solution INHALE 2 ML (20 MCG TOTAL) BY NEBULIZATION TWO (2) TIMES A DAY.   ipratropium (ATROVENT ) 0.02 % nebulizer solution Take 2.5 mLs (0.5 mg total) by nebulization 4 (four) times daily.   letrozole  (FEMARA ) 2.5 MG tablet Take 1 tablet (2.5 mg total) by mouth daily. Start after radiation is complete   levalbuterol  (XOPENEX  HFA) 45 MCG/ACT inhaler INHALE 1 PUFF INTO THE LUNGS EVERY 6 HOURS AS NEEDED FOR WHEEZING.   LORazepam  (ATIVAN ) 0.5 MG tablet Take 0.5-1 tablets (0.25-0.5 mg total)  by mouth as needed for anxiety (for breakthrough anxiety).   omeprazole  (PRILOSEC) 20 MG capsule Take 1 capsule (20 mg total) by mouth daily.   OXYGEN  Inhale into the lungs. 2 litre at night   predniSONE  (DELTASONE ) 10 MG tablet Take 10 mg by mouth daily.   sodium chloride  HYPERTONIC 3 % nebulizer solution Take 4 mLs by nebulization as needed for other.   traZODone  (DESYREL ) 100 MG tablet Take 1 tablet (100 mg total) by mouth at bedtime as needed. for sleep     Allergies:   Penicillins, Shellfish allergy, Sulfa antibiotics, Clarithromycin, Amoxicillin-pot clavulanate, Iodinated contrast media, Zetia   [ezetimibe ], Atorvastatin , and Macrobid [nitrofurantoin]   Social History   Socioeconomic History   Marital status: Divorced    Spouse name: Not on file   Number of children: Not on file   Years of education: Not on file   Highest education level: 12th grade  Occupational History   Not on file  Tobacco Use   Smoking status: Every Day    Current packs/day: 0.50    Average packs/day: 0.5 packs/day for 50.0 years (25.0 ttl pk-yrs)    Types: Cigarettes    Passive exposure: Past   Smokeless tobacco: Never  Vaping Use   Vaping status: Never Used  Substance and Sexual Activity   Alcohol use: Yes    Comment: social   Drug use: No   Sexual activity: Not Currently  Other Topics Concern   Not on file  Social History Narrative   Not on file   Social Drivers of Health   Financial Resource Strain: Low Risk  (05/20/2024)   Overall Financial Resource Strain (CARDIA)    Difficulty of Paying Living Expenses: Not very hard  Food Insecurity: No Food Insecurity (05/20/2024)   Hunger Vital Sign    Worried About Running Out of Food in the Last Year: Never true    Ran Out of Food in the Last Year: Never true  Transportation Needs: No Transportation Needs (05/20/2024)   PRAPARE - Administrator, Civil Service (Medical): No    Lack of Transportation (Non-Medical): No  Physical Activity: Inactive (05/20/2024)   Exercise Vital Sign    Days of Exercise per Week: 0 days    Minutes of Exercise per Session: Not on file  Stress: No Stress Concern Present (05/20/2024)   [employer] of Occupational Health - Occupational Stress Questionnaire    Feeling of Stress: Not at all  Social Connections: Socially Isolated (05/20/2024)   Social Connection and Isolation Panel    Frequency of Communication with Friends and Family: More than three times a week    Frequency of Social Gatherings with Friends and Family: Never    Attends Religious Services: Never    Database administrator or  Organizations: No    Attends Engineer, structural: Not on file    Marital Status: Divorced     Family History: The patient's family history includes Breast cancer in her paternal aunt; Heart attack in her mother.  ROS:   Please see the history of present illness.     All other systems reviewed and are negative.  EKGs/Labs/Other Studies Reviewed:    The following studies were reviewed today:       Recent Labs: 12/19/2023: Hemoglobin 12.7; Platelets 320; TSH 2.100 01/14/2024: ALT 22; BUN 9; Creatinine 0.89; Potassium 3.9; Sodium 131  Recent Lipid Panel    Component Value Date/Time   CHOL 203 (H) 12/19/2023 0850   CHOL  248 (H) 08/11/2013 0924   TRIG 221 (H) 12/19/2023 0850   TRIG 389 (H) 05/02/2017 1142   HDL 49 12/19/2023 0850   HDL 35 (L) 05/02/2017 1142   CHOLHDL 4.1 12/19/2023 0850   CHOLHDL 6.3 08/12/2016 0849   VLDL 49 (H) 08/12/2016 0849   VLDL 47 (H) 08/11/2013 0924   LDLCALC 116 (H) 12/19/2023 0850   LDLCALC 165 (H) 08/11/2013 0924     Risk Assessment/Calculations:         Physical Exam:    VS:  BP (!) 150/76 (BP Location: Right Arm, Patient Position: Sitting, Cuff Size: Normal)   Pulse 91   Ht 5' 2 (1.575 m)   Wt 151 lb 3.2 oz (68.6 kg)   SpO2 96%   BMI 27.65 kg/m     Wt Readings from Last 3 Encounters:  06/11/24 151 lb 3.2 oz (68.6 kg)  05/24/24 154 lb (69.9 kg)  05/19/24 152 lb (68.9 kg)     GEN:  Well nourished, well developed in no acute distress HEENT: Normal NECK: No JVD; No carotid bruits CARDIAC: RRR, no murmurs, rubs, gallops RESPIRATORY: Rhonchorous breath sounds ABDOMEN: Soft, non-tender, non-distended MUSCULOSKELETAL:  No edema; No deformity  SKIN: Warm and dry NEUROLOGIC:  Alert and oriented x 3 PSYCHIATRIC:  Normal affect   ASSESSMENT:    1. Coronary artery disease involving native coronary artery of native heart, unspecified whether angina present   2. Mixed hyperlipidemia   3. Current smoker   4. Elevated BP  without diagnosis of hypertension    PLAN:    In order of problems listed above:  CAD, LAD, RCA calcifications on chest CT. echo 6/24 EF 60 to 65%.  Denies chest pain, continue aspirin  81 mg daily, start bempedoic acid.  Continue ischemic workup if patient becomes symptomatic. Hyperlipidemia, not tolerant to statins, Zetia .  Start bempedoic acid 180 mg daily. check lipid panel in 3 months. Current smoker, smoking cessation again strongly advised. BP elevated today, usually controlled.  Monitor off BP meds.  Follow-up in 6 months.      Medication Adjustments/Labs and Tests Ordered: Current medicines are reviewed at length with the patient today.  Concerns regarding medicines are outlined above.  Orders Placed This Encounter  Procedures   Lipid panel   Meds ordered this encounter  Medications   Bempedoic Acid 180 MG TABS    Sig: Take 1 tablet (180 mg total) by mouth daily.    Dispense:  60 tablet    Refill:  3    Patient Instructions  Medication Instructions:  - START bempedoic acid 180 mg daily   *If you need a refill on your cardiac medications before your next appointment, please call your pharmacy*  Lab Work: Your provider would like for you to return in 3 months to have the following labs drawn: lipid panel.   Please go to [facility] [address] ([facility]) [address], [place] [address] You do not need an appointment.  They are open from 8 am- 4:30 pm.  Lunch from 1:00 pm- 2:00 pm You DO need to be fasting.  If you have labs (blood work) drawn today and your tests are completely normal, you will receive your results only by: MyChart Message (if you have MyChart) OR A paper copy in the mail If you have any lab test that is abnormal or we need to change your treatment, we will call you to review the results.  Testing/Procedures: No test ordered today   Follow-Up:  At [facility], you and your health needs are our priority.  As  part of our continuing mission to provide you with exceptional heart care, our providers are all part of one team.  This team includes your primary Cardiologist (physician) and Advanced Practice Providers or APPs (Physician Assistants and Nurse Practitioners) who all work together to provide you with the care you need, when you need it.  Your next appointment:   6 month(s)  Provider:   You may see Dr Darliss or one of the following Advanced Practice Providers on your designated Care Team:   Lonni Meager, NP Lesley Maffucci, PA-C Bernardino Bring, PA-C Cadence [place], PA-C Tylene Lunch, NP Barnie Hila, NP  We recommend signing up for the patient portal called MyChart.  Sign up information is provided on this After Visit Summary.  MyChart is used to connect with patients for Virtual Visits (Telemedicine).  Patients are able to view lab/test results, encounter notes, upcoming appointments, etc.  Non-urgent messages can be sent to your provider as well.   To learn more about what you can do with MyChart, go to [email or web address].          Signed, Redell Darliss, MD  06/11/2024 1:21 PM    [facility]

## 2024-06-11 NOTE — Telephone Encounter (Signed)
 Pharmacy Patient Advocate Encounter  Received notification from [employer] that Prior Authorization for Nexletol has been APPROVED from 06/11/24 to 12/12/24   PA #/Case ID/Reference #: [id number]

## 2024-06-11 NOTE — Telephone Encounter (Signed)
   Pharmacy Patient Advocate Encounter   Received notification from Cover[employer]Meds that prior authorization for nexletol is required/requested.   Insurance verification completed.   The patient is insured through [facility] .   Per test claim: PA required; PA submitted to above mentioned insurance via latent Key/confirmation #/EOC [facility] Status is pending

## 2024-06-14 ENCOUNTER — Inpatient Hospital Stay: Attending: Oncology | Admitting: *Deleted

## 2024-06-14 DIAGNOSIS — Z170 Estrogen receptor positive status [ER+]: Secondary | ICD-10-CM

## 2024-06-14 DIAGNOSIS — Z9189 Other specified personal risk factors, not elsewhere classified: Secondary | ICD-10-CM

## 2024-06-14 NOTE — Progress Notes (Signed)
 SUBJECTIVE: Pt returns for her 3 month L-Dex screen.    PAIN:  Are you having pain? No   SOZO SCREENING: Patient was assessed today using the SOZO machine to determine the lymphedema index score. This was compared to her baseline score. It was determined that she is within the recommended range when compared to her baseline and no further action is needed at this time. She will continue SOZO screenings. These are done every 3 months for 2 years post operatively followed by every 6 months for 2 years, and then annually.     L-DEX FLOWSHEETS                L-DEX LYMPHEDEMA SCREENING    Measurement Type Unilateral     L-DEX MEASUREMENT EXTREMITY Upper Extremity     POSITION  Standing     DOMINANT SIDE Right     At Risk Side Left     BASELINE SCORE (UNILATERAL) -1.1    L-DEX SCORE (UNILATERAL) 2.8    VALUE CHANGE (UNILAT) 3.9

## 2024-06-22 ENCOUNTER — Other Ambulatory Visit: Payer: Self-pay | Admitting: Family Medicine

## 2024-06-22 DIAGNOSIS — F411 Generalized anxiety disorder: Secondary | ICD-10-CM

## 2024-06-24 ENCOUNTER — Ambulatory Visit: Payer: Self-pay

## 2024-06-24 ENCOUNTER — Ambulatory Visit
Admission: RE | Admit: 2024-06-24 | Discharge: 2024-06-24 | Disposition: A | Discharge status: Home / Self Care | Source: Ambulatory Visit | Attending: Surgery | Admitting: Surgery

## 2024-06-24 ENCOUNTER — Ambulatory Visit
Admission: EM | Admit: 2024-06-24 | Discharge: 2024-06-24 | Disposition: A | Discharge status: Home / Self Care | Attending: Family Medicine | Admitting: Family Medicine

## 2024-06-24 DIAGNOSIS — R300 Dysuria: Secondary | ICD-10-CM | POA: Diagnosis present

## 2024-06-24 DIAGNOSIS — N3000 Acute cystitis without hematuria: Secondary | ICD-10-CM | POA: Diagnosis present

## 2024-06-24 DIAGNOSIS — Z923 Personal history of irradiation: Secondary | ICD-10-CM | POA: Diagnosis not present

## 2024-06-24 DIAGNOSIS — Z853 Personal history of malignant neoplasm of breast: Secondary | ICD-10-CM | POA: Insufficient documentation

## 2024-06-24 DIAGNOSIS — C50912 Malignant neoplasm of unspecified site of left female breast: Secondary | ICD-10-CM | POA: Insufficient documentation

## 2024-06-24 LAB — URINALYSIS, W/ REFLEX TO CULTURE (INFECTION SUSPECTED)
Bilirubin Urine: NEGATIVE
Glucose, UA: NEGATIVE mg/dL
Hgb urine dipstick: NEGATIVE
Ketones, ur: NEGATIVE mg/dL
Nitrite: NEGATIVE
Protein, ur: NEGATIVE mg/dL
Specific Gravity, Urine: <1.005 — ABNORMAL LOW (ref 1.005–1.030)
pH: 5.5 (ref 5.0–8.0)

## 2024-06-24 MED ORDER — CIPROFLOXACIN HCL 500 MG PO TABS: 500.0000 mg | ORAL_TABLET | Freq: Two times a day (BID) | ORAL | 0 refills | Status: DC

## 2024-06-24 MED ORDER — PHENAZOPYRIDINE HCL 200 MG PO TABS: 200.0000 mg | ORAL_TABLET | Freq: Three times a day (TID) | ORAL | 0 refills | Status: DC

## 2024-06-24 NOTE — Telephone Encounter (Signed)
 FYI Only or Action Required?: FYI only for provider.  Patient was last seen in primary care on 05/24/2024 by Towana Small, FNP.  Called Nurse Triage reporting urinary symptoms.  Symptoms began today.  Interventions attempted: OTC medications: Motrin .  Symptoms are: gradually worsening.  Triage Disposition: See PCP Within 2 Weeks  Patient/caregiver understands and will follow disposition?: No  Copied from CRM [id number]. Topic: Clinical - Red Word Triage >> Jun 24, 2024 12:00 PM Tiffini S wrote: [facility] that prompted transfer to Nurse Triage: Patient have a UTI- patient feels pressure, hurts, and little pee, no burning. Transferred call to triage nurse. Reason for Disposition  Urination is difficult to start (i.e., hesitancy) or straining  Answer Assessment - Initial Assessment Questions 1. SYMPTOM: What's the main symptom you're concerned about? (e.g., frequency, incontinence)     Bladder Pressure  2. ONSET: When did the  pressure start?     Today 130am 3. PAIN: Is there any pain? If Yes, ask: How bad is it? (Scale: 1-10; mild, moderate, severe)     States not painful but a lot of pressure 4. CAUSE: What do you think is causing the symptoms?     Unsure, maybe UTI 5. OTHER SYMPTOMS: Do you have any other symptoms? (e.g., blood in urine, fever, flank pain, pain with urination)     Urgency, voiding small amounts at times, dribbling. Other times normal. Increased frequency.    Additional info: Motrin  without effect.  Requesting appointment in clinic today, none are available until August 19, patient plans to proceed to urgent care for evaluation and treatment.  Protocols used: Urinary Symptoms-A-AH

## 2024-06-24 NOTE — ED Provider Notes (Signed)
 [facility]    CSN: [id number] Arrival date & time: 06/24/24  1223      History   Chief Complaint Chief Complaint  Patient presents with   Dysuria     HPI HPI Stephanie Hess is a 73 y.o. female.    Stephanie Hess presents for dysuria and urinary frequency and urgently that started this morning around 130 AM.  Tried Motrin  prior to arrival.  Takes Azithromycin  daily for her lungs.  No LMP recorded. Patient is postmenopausal.  Denies history of kidney stones.    - Abnormal vaginal discharge: no - vaginal odor: no - vaginal bleeding: no - Dysuria: yes  - Hematuria: no - Urinary urgency:no  - Urinary frequency: no  - Fever: no - Abdominal pain: no  - Pelvic pain: no - Rash/Skin lesions/mouth ulcers: no - Nausea: no - Vomiting: no  - Back Pain: no        Past Medical History:  Diagnosis Date   Allergy    Anxiety    Asthma 2022   Breast cancer (HCC)    Cancer (HCC)    COPD (chronic obstructive pulmonary disease) (HCC)    Diverticula of colon    GERD (gastroesophageal reflux disease)    Heart murmur    Hyperlipidemia    Oxygen  deficiency    Personal history of radiation therapy    Renal insufficiency    2009 or 2010    Patient Active Problem List   Diagnosis Date Noted   Greater trochanteric pain syndrome 05/28/2024   Pneumonia of left upper lobe due to infectious organism 02/08/2024   Rhinovirus 02/08/2024   Statin intolerance 12/18/2023   Osteopenia of neck of left femur 12/18/2023   Malignant neoplasm of upper-outer quadrant of left breast in female, 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/2023   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) 07/08/2023   Aortic atherosclerosis (HCC) 05/23/2023   BMI 30.0-30.9,adult 03/07/2022   Generalized anxiety disorder 08/19/2021   Chronic obstructive pulmonary disease (HCC) 07/13/2021   Sternoclavicular joint pain 04/01/2018   Nicotine  abuse 03/03/2018   Mild intermittent asthma  without complication 03/03/2018   Pulmonary fibrosis (HCC) 03/03/2018   GERD (gastroesophageal reflux disease) 03/03/2018   Hyperlipidemia, mixed 05/15/2017   Vitamin D  deficiency 05/15/2017    Past Surgical History:  Procedure Laterality Date   APPENDECTOMY     AXILLARY SENTINEL NODE BIOPSY Left 08/06/2023   Procedure: AXILLARY SENTINEL NODE BIOPSY;  Surgeon: Lane Shope, MD;  Location: [facility];  Service: General;  Laterality: Left;   BREAST BIOPSY Left 07/02/2023   US  LT BREAST BX W LOC DEV 1ST LESION IMG BX SPEC US  GUIDE 07/02/2023 [facility]   BREAST LUMPECTOMY Left 08/06/2023   BREAST LUMPECTOMY WITH RADIOFREQUENCY TAG IDENTIFICATION Left 08/06/2023   Procedure: BREAST LUMPECTOMY WITH RADIOFREQUENCY TAG IDENTIFICATION;  Surgeon: Lane Shope, MD;  Location: [facility];  Service: General;  Laterality: Left;   CESAREAN SECTION     ECTOPIC PREGNANCY SURGERY     TUBAL LIGATION      OB History     Gravida  4   Para  4   Term  2   Preterm      AB      Living  2      SAB      IAB      Ectopic      Multiple      Live Births  Home Medications    Prior to Admission medications   Medication Sig Start Date End Date Taking? Authorizing Provider  ciprofloxacin  (CIPRO ) 500 MG tablet Take 1 tablet (500 mg total) by mouth every 12 (twelve) hours. 06/24/24  Yes Moncerrath Berhe, DO  acetaminophen  (TYLENOL ) 500 MG tablet Take 1,000 mg by mouth every 6 (six) hours as needed.    [provider]  aspirin  EC 81 MG tablet Take 1 tablet (81 mg total) by mouth daily. Swallow whole. 03/10/24   Darliss Rogue, MD  Bempedoic Acid  180 MG TABS Take 1 tablet (180 mg total) by mouth daily. 06/11/24   Darliss Rogue, MD  budesonide  (PULMICORT ) 0.5 MG/2ML nebulizer solution Take 2 mLs by nebulization 2 (two) times daily. 05/11/23   [provider]  ezetimibe  (ZETIA ) 10 MG tablet Take 1 tablet (10 mg total) by mouth daily. 03/10/24 03/10/25   Darliss Rogue, MD  formoterol  (PERFOROMIST ) 20 MCG/2ML nebulizer solution INHALE 2 ML (20 MCG TOTAL) BY NEBULIZATION TWO (2) TIMES A DAY. 09/18/21   [provider]  ipratropium (ATROVENT ) 0.02 % nebulizer solution Take 2.5 mLs (0.5 mg total) by nebulization 4 (four) times daily. 08/08/21   Boscia, Heather E, NP  letrozole  (FEMARA ) 2.5 MG tablet TAKE 1 TABLET (2.5 MG TOTAL) BY MOUTH DAILY. START AFTER RADIATION IS COMPLETE 06/11/24   Melanee Annah BROCKS, MD  levalbuterol  (XOPENEX  HFA) 45 MCG/ACT inhaler INHALE 1 PUFF INTO THE LUNGS EVERY 6 HOURS AS NEEDED FOR WHEEZING. 02/20/23   Hanford Powell BRAVO, NP  LORazepam  (ATIVAN ) 0.5 MG tablet Take 0.5-1 tablets (0.25-0.5 mg total) by mouth as needed for anxiety (for breakthrough anxiety). 05/23/24   Towana Small, FNP  omeprazole  (PRILOSEC) 20 MG capsule Take 1 capsule (20 mg total) by mouth daily. 02/18/24   Towana Small, FNP  OXYGEN  Inhale into the lungs. 2 litre at night    [provider]  predniSONE  (DELTASONE ) 10 MG tablet Take 10 mg by mouth daily. 05/11/23   [provider]  sodium chloride  HYPERTONIC 3 % nebulizer solution Take 4 mLs by nebulization as needed for other. 02/18/24   Towana Small, FNP  traZODone  (DESYREL ) 100 MG tablet Take 1 tablet (100 mg total) by mouth at bedtime as needed. for sleep 02/18/24   Towana Small, FNP    Family History Family History  Problem Relation Age of Onset   Breast cancer Paternal Aunt    Heart attack Mother     Social History Social History   Tobacco Use   Smoking status: Every Day    Current packs/day: 0.50    Average packs/day: 0.5 packs/day for 50.0 years (25.0 ttl pk-yrs)    Types: Cigarettes    Passive exposure: Past   Smokeless tobacco: Never  Vaping Use   Vaping status: Never Used  Substance Use Topics   Alcohol use: Yes    Comment: social   Drug use: No     Allergies   Clarithromycin, Penicillins, Shellfish allergy, Sulfa antibiotics, Amoxicillin-pot  clavulanate, Iodinated contrast media, Zetia  [ezetimibe ], Atorvastatin , and Nitrofurantoin   Review of Systems Review of Systems: :negative unless otherwise stated in HPI.      Physical Exam Triage Vital Signs ED Triage Vitals  Encounter Vitals Group     BP 06/24/24 1244 128/70     Girls Systolic BP Percentile --      Girls Diastolic BP Percentile --      Boys Systolic BP Percentile --      Boys Diastolic BP Percentile --  Pulse Rate 06/24/24 1244 (!) 102     Resp 06/24/24 1244 16     Temp 06/24/24 1244 98 F (36.7 C)     Temp Source 06/24/24 1244 Oral     SpO2 06/24/24 1244 100 %     Weight 06/24/24 1243 151 lb 3.2 oz (68.6 kg)     Height 06/24/24 1243 5' 2 (1.575 m)     Head Circumference --      Peak Flow --      Pain Score 06/24/24 1246 10     Pain Loc --      Pain Education --      Exclude from Growth Chart --    No data found.  Updated Vital Signs BP 128/70 (BP Location: Right Arm)   Pulse (!) 102   Temp 98 F (36.7 C) (Oral)   Resp 16   Ht 5' 2 (1.575 m)   Wt 68.6 kg   SpO2 100%   BMI 27.65 kg/m   Visual Acuity Right Eye Distance:   Left Eye Distance:   Bilateral Distance:    Right Eye Near:   Left Eye Near:    Bilateral Near:     Physical Exam GEN: well appearing female in no acute distress  CVS: well perfused  RESP: speaking in full sentences without pause  ABD: soft, suprapubic tenderness, non-distended, no palpable masses, no CVA tenderness   SKIN: warm, dry    UC Treatments / Results  Labs (all labs ordered are listed, but only abnormal results are displayed) Labs Reviewed  URINALYSIS, W/ REFLEX TO CULTURE (INFECTION SUSPECTED) - Abnormal; Notable for the following components:      Result Value   Color, Urine STRAW (*)    Specific Gravity, Urine <1.005 (*)    Leukocytes,Ua SMALL (*)    Bacteria, UA MANY (*)    All other components within normal limits  URINE CULTURE    EKG   Radiology No results  found.  Procedures Procedures (including critical care time)  Medications Ordered in UC Medications - No data to display  Initial Impression / Assessment and Plan / UC Course  I have reviewed the triage vital signs and the nursing notes.  Pertinent labs & imaging results that were available during my care of the patient were reviewed by me and considered in my medical decision making (see chart for details).       Acute cystitis:  Patient is a 73 y.o. female  who presents for 1 day of dysuria and urinary urgency.  Overall patient is well-appearing and afebrile.  Vital signs stable.  UA consistent with acute cystitis.  Hematuria not supported on microscopy. Treat with Cipro  times daily for  7 days.  Urine culture obtained.  Follow-up sensitivities and change antibiotics, if needed.   - Return precautions including abdominal pain, fever, chills, nausea, or vomiting given. Follow-up,  if symptoms not improving or getting worse. Discussed MDM, treatment plan and plan for follow-up with patient who agrees with plan.        Final Clinical Impressions(s) / UC Diagnoses   Final diagnoses:  Acute cystitis without hematuria     Discharge Instructions       You have a urinary tract infection. I sent your urine for culture to be sure the antibiotic prescribed will treat your infection. Someone may call you to change antibiotics. Stop by the pharmacy to pick up your prescriptions.  Follow up with your primary care provider or return to  the urgent care, if not improving.      ED Prescriptions     Medication Sig Dispense Auth. Provider   ciprofloxacin  (CIPRO ) 500 MG tablet Take 1 tablet (500 mg total) by mouth every 12 (twelve) hours. 14 tablet Anzley Dibbern, DO      PDMP not reviewed this encounter.   Prentice Sackrider, DO 06/24/24 1307

## 2024-06-24 NOTE — Progress Notes (Unsigned)
 [facility] POST-OP OFFICE VISIT  06/24/2024  HPI: Stephanie Hess is a 73 y.o. female is s/p Scout tag to left breast lumpectomy with sentinel lymph node biopsy, from August 06, 2023.   She has little complaint regarding postoperative pain or discomfort.  There is some lateral shoulder outer upper arm shooting pains.  She still has some hot flashes from her letrozole .  But otherwise seems to be tolerating it okay.  Complains of fatigue/weakness.   ____________________________________ REPORT OF SURGICAL PATHOLOGY Date: August 06, 2023  Accession #: [id number] Patient Name: Stephanie Hess, Stephanie Hess Visit # : [id number]  MRN: [id number] Physician: Lane Shope DOB/Age May 12, 1951 (Age: 8) Gender: F Collected Date: 08/06/2023 Received Date: 08/06/2023  FINAL DIAGNOSIS       1. Lymph node, sentinel, biopsy, #1 Left axilla :      ONE LYMPH NODE, NEGATIVE FOR METASTATIC CARCINOMA (0/1).       2. Breast, lumpectomy, Left upper :      INVASIVE CARCINOMA WITH MIXED LOBULAR AND DUCTAL FEATURES, 0.9 CM, GRADE 2      DUCTAL CARCINOMA IN SITU:  CRIBRIFORM TYPE, INTERMEDIATE GRADE      MARGINS, INVASIVE:  NEGATIVE      CLOSEST, INVASIVE:  2.5 MM FROM ANTERIOR MARGIN      MARGINS, DCIS:  NEGATIVE      CLOSEST, DCIS:  5 MM FROM ANTERIOR AND POSTERIOR MARGINS      LYMPHOVASCULAR INVASION:  NOT IDENTIFIED      PROGNOSTIC MARKERS:      ER, PR, HER2, KI-67 WILL BE REPORTED IN AN ADDENDUM      OTHER: SCLEROSING ADENOSIS      SEE ONCOLOGY TABLE AND NOTE      ONCOLOGY TABLE:      INVASIVE CARCINOMA OF THE BREAST:  RESECTION      PROCEDURE: LUMPECTOMY      SPECIMEN LATERALITY: LEFT UPPER      HISTOLOGIC TYPE:INVASIVE CARCINOMA WITH MIXED LOBULAR AND DUCTAL FEATURES      HISTOLOGIC GRADE:      GLANDULAR (ACINAR)/TUBULAR DIFFERENTIATION: 3      NUCLEAR PLEOMORPHISM: 2      MITOTIC RATE: 1      OVERALL GRADE: 6      TUMOR SIZE: 0.9 CM IN MAXIMAL DIMENSION      DUCTAL CARCINOMA  IN SITU: CRIBRIFORM TYPES, INTERMEDIATE GRADE      LYMPHATIC AND/OR VASCULAR INVASION:  NOT IDENTIFIED      TREATMENT EFFECT IN THE BREAST: NO KNOWN PRESURGICAL THERAPY      MARGINS: ALL MARGINS NEGATIVE FOR INVASIVE CARCINOMA      DISTANCE FROM CLOSEST MARGIN (MM): 2.5 MM FROM ANTERIOR MARGIN      SPECIFY CLOSEST MARGIN (REQUIRED ONLY IF <10MM): ANTERIOR MARGIN      DCIS MARGINS: UNINVOLVED BY DCIS      DISTANCE FROM CLOSEST MARGIN (MM): 5 MM FROM ANTERIOR AND POSTERIOR MARGINS      SPECIFY CLOSEST MARGIN (REQUIRED ONLY IF <10MM): ANTERIOR AND POSTERIOR MARGINS      REGIONAL LYMPH NODES:      NUMBER OF LYMPH NODES EXAMINED: 1      NUMBER OF SENTINEL NODES EXAMINED: 1      NUMBER OF LYMPH NODES WITH MACROMETASTASES (>2 MM): 0      NUMBER OF LYMPH NODES WITH MICROMETASTASES: 0      NUMBER OF LYMPH NODES WITH ISOLATED TUMOR CELLS (=0.2 MM OR =200 CELLS): 0      SIZE OF LARGEST METASTATIC  DEPOSIT (MM): NA      EXTRANODAL EXTENSION: NA      DISTANT METASTASIS:      DISTANT SITE(S) INVOLVED: NOT APPLICABLE      BREAST BIOMARKER TESTING PERFORMED ON PREVIOUS BIOPSY:      TESTING PERFORMED ON CASE NUMBER: NONE      ESTROGEN RECEPTOR, PROGESTERONE RECEPTOR, HER2, KI-67, WILL BE REPORTED IN AN      ADDENDUM      PATHOLOGIC STAGE CLASSIFICATION (PTNM, AJCC 8TH EDITION): PT1B, PN0      REPRESENTATIVE TUMOR BLOCK: 2/C      COMMENT(S): SEE NOTE      (V4.5.0.0)       Diagnosis Note : The lesion is composed of predominant lobular carcinoma with      small component of ductal carcinoma at the periphery of the lesion.Ductal      carcinoma in situ is also seen within the lesion.Immunohistochemical stain for      E-Cadherin shows loss of expression in the lobular carcinoma component while it      is intact in the ductal carcinoma.Myoepithelial markers (Calponin, SMM-1 and      p63) show loss of staining in the invasive ductal carcinoma.The overall findings      are in keeping with an invasive  carcinoma with mixed lobular and ductal      features.CK AE1/AE3 performed on 1/A does not show evidence of metastatic      carcinoma in the lymph node.      Controls worked appropriately.  ADDENDUM Breast, lumpectomy, Left upper PROGNOSTIC INDICATORS  Results: IMMUNOHISTOCHEMICAL AND MORPHOMETRIC ANALYSIS PERFORMED MANUALLY The tumor cells are equivocal for Her2 (2+). Her2 by FISH will be performed and the results reported separately. Estrogen Receptor:  100%, POSITIVE, STRONG STAINING INTENSITY Progesterone Receptor:  60%, POSITIVE, STRONG STAINING INTENSITY Proliferation Marker Ki67:  20% REFERENCE RANGE ESTROGEN RECEPTOR NEGATIVE     0% POSITIVE       =>1% REFERENCE RANGE PROGESTERONE RECEPTOR NEGATIVE     0% POSITIVE        =>1% All controls stained appropriately Stuart Come, Zhaoli, Sports administrator, International aid/development worker ( Signed 10 02 2024) 2C-Breast,lumpectomy FLUORESCENCE IN-SITU HYBRIDIZATION  Results: GROUP 5:   HER2 **NEGATIVE**  Equivocal form of amplification of the HER2 gene was detected in the IHC 2+ tissue sample received from this individual.  HER2 FISH was performed by a technologist and cell imaging and analysis on the [employer]. RATIO OF HER2/CEN17 SIGNALS                          1.15 AVERAGE HER2 COPY NUMBER PER CELL      1.09 The ratio of HER2/CEN 17 is within the range < 2.0 of HER2/CEN 17 and a copy number of HER2 signals per cell is <4.0.    Vital signs: There were no vitals taken for this visit.   Physical Exam: Constitutional: She appears well, at her baseline Skin: Her left circumareolar is clean and healed nicely.  Both breasts are quite soft and supple with no suspicious nodularity or asymmetric density or mass or skin change.  It appears her skin has recovered quite well from her radiation changes.  Her left axillary incision is clean dry and intact.  Without evidence of any fluid retention, or tethering scarring.   CLINICAL DATA:  Status post LEFT  breast lumpectomy in September 2024. Radiation completed December 2024. Taking letrozole .   EXAM: DIGITAL DIAGNOSTIC BILATERAL MAMMOGRAM WITH TOMOSYNTHESIS  AND CAD; ULTRASOUND LEFT BREAST LIMITED   TECHNIQUE: Bilateral digital diagnostic mammography and breast tomosynthesis was performed. The images were evaluated with computer-aided detection. ; Targeted ultrasound examination of the left breast was performed.   COMPARISON:  Previous exam(s).   ACR Breast Density Category b: There are scattered areas of fibroglandular density.   FINDINGS: No mammographic evidence of malignancy in the RIGHT breast.   There is density and architectural distortion within the LEFT breast, corresponding to the site of prior lumpectomy. Spot compression magnification view(s) demonstrate no evidence of recurrent malignancy. Findings are stable compared to prior.   Questioned vague asymmetry throughout the upper-outer quadrant of the LEFT breast on initial MLO image essentially resolves with spot compression imaging, and may have been due to under compression or reflective post radiation changes. Targeted ultrasound of the upper-outer quadrant of the LEFT breast into the axillary tail was performed as a precaution, without suspicious abnormality in this region.   IMPRESSION: Posttreatment changes of the LEFT breast without evidence of local recurrence. No mammographic evidence of malignancy in EITHER breast, or sonographic evidence of malignancy in the LEFT breast.   RECOMMENDATION: Diagnostic mammogram of the BILATERAL breasts in 1 year.   I have discussed the findings and recommendations with the patient. If applicable, a reminder letter will be sent to the patient regarding the next appointment.   BI-RADS CATEGORY  2: Benign.     Electronically Signed   By: Norleen Croak M.D.   On: 06/24/2024 11:19   Assessment/Plan: This is a 72 y.o. female 13 days s/p breast conservation surgery.   She is completed radiation, and continues her letrozole .  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  Follow-up bilateral breast imaging in August for resumption of a annual screening.  And long-term follow-up, physical exam in 1 year, or as needed in August based on imaging.   Honor Leghorn M.D., FACS 06/24/2024, 1:48 PM

## 2024-06-24 NOTE — ED Triage Notes (Signed)
 Pt c/o urinary freq & pressure that started today. Denies any burning,pain or hematuria. Has tried monistat w/o relief.

## 2024-06-24 NOTE — Discharge Instructions (Signed)
 You have a urinary tract infection. I sent your urine for culture to be sure the antibiotic prescribed will treat your infection. Someone may call you to change antibiotics. Stop by the pharmacy to pick up your prescriptions.  Follow up with your primary care provider or return to the urgent care, if not improving.

## 2024-06-25 ENCOUNTER — Encounter: Payer: Self-pay | Admitting: Family Medicine

## 2024-06-25 LAB — URINE CULTURE

## 2024-06-29 ENCOUNTER — Ambulatory Visit (HOSPITAL_COMMUNITY): Payer: Self-pay

## 2024-06-30 ENCOUNTER — Other Ambulatory Visit: Payer: Self-pay | Admitting: Family Medicine

## 2024-06-30 ENCOUNTER — Encounter: Payer: Self-pay | Admitting: Family Medicine

## 2024-06-30 ENCOUNTER — Ambulatory Visit (INDEPENDENT_AMBULATORY_CARE_PROVIDER_SITE_OTHER): Admitting: Family Medicine

## 2024-06-30 VITALS — BP 113/73 | HR 78 | Temp 98.7°F | Resp 16 | Ht 62.0 in | Wt 156.0 lb

## 2024-06-30 DIAGNOSIS — R399 Unspecified symptoms and signs involving the genitourinary system: Secondary | ICD-10-CM

## 2024-06-30 DIAGNOSIS — F411 Generalized anxiety disorder: Secondary | ICD-10-CM

## 2024-06-30 LAB — POCT URINALYSIS DIPSTICK
Bilirubin, UA: NEGATIVE
Glucose, UA: NEGATIVE
Ketones, UA: NEGATIVE
Nitrite, UA: NEGATIVE
Protein, UA: NEGATIVE
Spec Grav, UA: 1.015 (ref 1.010–1.025)
Urobilinogen, UA: 0.2 U/dL
pH, UA: 5.5 (ref 5.0–8.0)

## 2024-06-30 MED ORDER — LORAZEPAM 0.5 MG PO TABS
0.2500 mg | ORAL_TABLET | ORAL | 0 refills | Status: DC | PRN
Start: 2024-06-30 — End: 2024-07-29

## 2024-06-30 MED ORDER — PHENAZOPYRIDINE HCL 200 MG PO TABS: 200.0000 mg | ORAL_TABLET | Freq: Three times a day (TID) | ORAL | 0 refills | Status: DC

## 2024-06-30 NOTE — Progress Notes (Signed)
 Established Patient Office Visit  Subjective  Patient ID: Stephanie Hess, female    DOB: 05/03/1951  Age: 73 y.o. MRN: [id number]  CC: abdominal pressure with urinary symptoms   Discussed the use of AI scribe software for clinical note transcription with the patient, who gave verbal consent to proceed.  History of Present Illness   Stephanie Hess is a 73 year old female who presents with urinary pressure and urgency.  She experiences significant pressure in the bladder area, described as a sensation of needing to empty the bladder frequently without a burning sensation. She took two Tylenol  at 1 PM today, which did not alleviate the pressure. Previously, she was prescribed Pyridium  that turned her urine orange and helped with the pain and pressure, but she only received a limited supply. She was informed that the medication was similar to Azo, which she can purchase over the counter, but she is concerned it might affect urine test results.  She was treated with ciprofloxacin  on August 14th at an urgent care visit. She had no symptoms until Tuesday or Wednesday, but then experienced frequent urination, going every hour on a Friday night after starting the medication. She denies noticing any blood in her urine. She feels cold in her feet and hands at night, but does not mention fever or chills. She denies any burning sensation, describing the feeling as pressure rather than pain. She does not consume soft drinks, preferring tea, water , and cranberry juice.   In addition to her urinary symptoms, she mentions hip pain that was diagnosed as bursitis by an orthopedic specialist. She received a cortisone shot, which provided significant relief, although she occasionally experiences pain that she describes as similar to sciatic nerve pain.  ROS: see HPI     Objective:     BP 113/73   Pulse 78   Temp 98.7 F (37.1 C) (Oral)   Resp 16   Ht 5' 2 (1.575 m)   Wt 156 lb (70.8 kg)   SpO2 94%    BMI 28.53 kg/m  BP Readings from Last 3 Encounters:  06/30/24 113/73  06/24/24 128/70  06/11/24 (!) 150/76     Physical Exam Vitals reviewed.  Constitutional:      Appearance: Normal appearance.  Cardiovascular:     Rate and Rhythm: Normal rate and regular rhythm.     Pulses: Normal pulses.     Heart sounds: Normal heart sounds.  Pulmonary:     Effort: Pulmonary effort is normal.     Breath sounds: Normal breath sounds.  Abdominal:     General: Bowel sounds are normal.     Palpations: Abdomen is soft.     Tenderness: There is no abdominal tenderness. There is no right CVA tenderness or left CVA tenderness.  Neurological:     Mental Status: She is alert.  Psychiatric:        Mood and Affect: Mood normal.        Behavior: Behavior normal.       Assessment & Plan:   1. Urinary tract infection symptoms (Primary) Patient is a pleasant 73 year old female patient who presents bladder pressure and urgency without dysuria or incomplete voiding. Symptoms may be residual from UTI. POCT UA with positive blood and leuks. Will await urine culture results prior to prescription for antibiotic. Prescribed Azo for symptomatic relief. Advised increased water  intake.  Await urine culture results for antibiotic decision. Instructed to seek emergency care for abdominal pain, hematuria, or urinary retention. -  Urine Culture - phenazopyridine  (PYRIDIUM ) 200 MG tablet; Take 1 tablet (200 mg total) by mouth 3 (three) times daily.  Dispense: 6 tablet; Refill: 0    Return if symptoms worsen or fail to improve.    [name], FNP

## 2024-06-30 NOTE — Progress Notes (Signed)
 PDMP reviewed, no red flags. Rx sent to pharmacy on file.

## 2024-07-01 ENCOUNTER — Encounter: Payer: Self-pay | Admitting: Surgery

## 2024-07-01 ENCOUNTER — Ambulatory Visit (INDEPENDENT_AMBULATORY_CARE_PROVIDER_SITE_OTHER): Admitting: Surgery

## 2024-07-01 VITALS — BP 150/80 | HR 109 | Temp 98.4°F | Ht 62.0 in | Wt 153.0 lb

## 2024-07-01 DIAGNOSIS — C50912 Malignant neoplasm of unspecified site of left female breast: Secondary | ICD-10-CM

## 2024-07-01 DIAGNOSIS — Z170 Estrogen receptor positive status [ER+]: Secondary | ICD-10-CM | POA: Diagnosis not present

## 2024-07-01 DIAGNOSIS — Z08 Encounter for follow-up examination after completed treatment for malignant neoplasm: Secondary | ICD-10-CM

## 2024-07-01 NOTE — Patient Instructions (Signed)
 We will see you in a year for a follow up mammogram, our schedule is not available yet, so we placed you in our recall system and will send you out a letter with the appointment information.    Breast Self-Exam Doing breast self-exams can help you stay healthy. They're one way to know what's normal for your breasts. They can help you catch a problem while it's still small and can be treated. You need to: Check your breasts often. Tell your doctor about any changes. You should do breast self-exams even if you have breast implants. What you need: A mirror. A well-lit room. A pillow or other soft object. How to do a breast self-exam Look for changes  Take off all the clothes above your waist. Stand in front of a mirror in a room with good lighting. Put your hands down at your sides. Compare your breasts in the mirror. Look for difference between them, such as: Differences in shape. Differences in size. Wrinkles, dips, and bumps in one breast and not the other. Look at each breast for skin changes, such as: Redness. Scaly spots. Spots where your skin is thicker. Dimpling. Open sores. Look for changes in your nipples, such as: Fluid coming out of a nipple. Fluid around a nipple. Bleeding. Dimpling. Redness. A nipple that looks pushed in or that has changed position. Feel for changes Lie on your back. Feel each breast. To do this: Pick a breast to feel. Place a pillow under the shoulder closest to that breast. Put the arm closest to that breast behind your head. Feel the breast using the hand of your other arm. Use the pads of your three middle fingers to make small circles starting near the nipple. Use light, medium, and firm pressure. Keep making circles, moving down over the breast. Stop when you feel your ribs. Start making circles with your fingers again, this time going up until you reach your collarbone. Then, make circles out across your breast and into your armpit  area. Squeeze your nipple. Check for fluid and lumps. Do these steps again to check your other breast. Sit or stand in the tub or shower. With soapy water  on your skin, feel each breast the same way you did when you were lying down. Write down what you find Writing down what you find can help you keep track of what you want to tell your doctor. Write down: What's normal for each breast. Any changes you find. Write down: The kind of change. If your breast feels tender or painful. Any lump you find. Write down its size and where it is. When you last had your period. General tips If you're breastfeeding, the best time to check your breasts is after you feed your baby or after you use a breast pump. If you get a period, the best time to check your breasts is 5-7 days after your period ends. With time, you'll get more used to doing the self-exam. You'll also start to know if there are changes in your breasts. Contact a doctor if: You see a change in the shape or size of your breasts or nipples. You see a change in the skin of your breast or nipples. You have fluid coming from your nipples that isn't normal. You find a new lump or thick area. You have breast pain. You have any concerns about your breast health. This information is not intended to replace advice given to you by your health care provider. Make sure you  discuss any questions you have with your health care provider. Document Revised: 01/07/2024 Document Reviewed: 01/07/2024 Elsevier Patient Education  2025 [employer].

## 2024-07-02 MED ORDER — DOXYCYCLINE HYCLATE 100 MG PO TABS: 100.0000 mg | ORAL_TABLET | Freq: Two times a day (BID) | ORAL | 0 refills | Status: AC

## 2024-07-02 NOTE — Addendum Note (Signed)
 Addended by: TOWANA SMALL on: 07/02/2024 11:58 AM   Modules accepted: Orders

## 2024-07-03 LAB — URINE CULTURE

## 2024-07-05 ENCOUNTER — Ambulatory Visit: Payer: Self-pay | Admitting: Family Medicine

## 2024-07-05 ENCOUNTER — Other Ambulatory Visit: Payer: Self-pay | Admitting: Family Medicine

## 2024-07-05 NOTE — Progress Notes (Unsigned)
Cefepime

## 2024-07-07 ENCOUNTER — Other Ambulatory Visit: Payer: Self-pay | Admitting: Family Medicine

## 2024-07-07 MED ORDER — FOSFOMYCIN TROMETHAMINE 3 G PO PACK: PACK | ORAL | 0 refills | Status: DC

## 2024-07-29 ENCOUNTER — Other Ambulatory Visit: Payer: Self-pay | Admitting: Family Medicine

## 2024-07-29 DIAGNOSIS — F411 Generalized anxiety disorder: Secondary | ICD-10-CM

## 2024-07-29 NOTE — Telephone Encounter (Unsigned)
 Copied from CRM [id number]. Topic: Clinical - Medication Refill >> Jul 29, 2024  1:31 PM Winona R wrote: Medication:  traZODone  (DESYREL ) 100 MG tablet     Has the patient contacted their pharmacy? Yes (Agent: If no, request that the patient contact the pharmacy for the refill. If patient does not wish to contact the pharmacy document the reason why and proceed with request.) (Agent: If yes, when and what did the pharmacy advise?)  This is the patient's preferred pharmacy:  [facility] [phone] [place] [place] - [address] [place] [address] Phone: [phone] Fax: [phone]  Is this the correct pharmacy for this prescription? Yes If no, delete pharmacy and type the correct one.   Has the prescription been filled recently? No  Is the patient out of the medication? Yes  Has the patient been seen for an appointment in the last year OR does the patient have an upcoming appointment? Yes  Can we respond through MyChart? Yes  Agent: Please be advised that Rx refills may take up to 3 business days. We ask that you follow-up with your pharmacy.

## 2024-07-29 NOTE — Telephone Encounter (Signed)
 Copied from CRM [id number]. Topic: Clinical - Medication Refill >> Jul 29, 2024  1:35 PM Pinkey ORN wrote: Medication: traZODone  (DESYREL ) 100 MG tablet, LORazepam  (ATIVAN ) 0.5 MG tablet  Has the patient contacted their pharmacy? Yes (Agent: If no, request that the patient contact the pharmacy for the refill. If patient does not wish to contact the pharmacy document the reason why and proceed with request.) (Agent: If yes, when and what did the pharmacy advise?)  This is the patient's preferred pharmacy:  [facility] [phone] [place], [place] - [address] [place] [address] Phone: [phone] Fax: [phone]  Is this the correct pharmacy for this prescription? Yes If no, delete pharmacy and type the correct one.   Has the prescription been filled recently? No  Is the patient out of the medication? No  Has the patient been seen for an appointment in the last year OR does the patient have an upcoming appointment? Yes  Can we respond through MyChart? Yes  Agent: Please be advised that Rx refills may take up to 3 business days. We ask that you follow-up with your pharmacy.

## 2024-08-02 MED ORDER — LORAZEPAM 0.5 MG PO TABS: 0.2500 mg | ORAL_TABLET | ORAL | 0 refills | Status: DC | PRN

## 2024-08-02 MED ORDER — TRAZODONE HCL 100 MG PO TABS: 100.0000 mg | ORAL_TABLET | Freq: Every evening | ORAL | 2 refills | Status: DC | PRN

## 2024-08-03 ENCOUNTER — Ambulatory Visit

## 2024-08-03 DIAGNOSIS — Z0000 Encounter for general adult medical examination without abnormal findings: Secondary | ICD-10-CM

## 2024-08-04 LAB — COMPREHENSIVE METABOLIC PANEL WITH GFR
ALT: 14 IU/L (ref 0–32)
AST: 15 IU/L (ref 0–40)
Albumin: 4.1 g/dL (ref 3.8–4.8)
Alkaline Phosphatase: 66 IU/L (ref 49–135)
BUN/Creatinine Ratio: 13 (ref 12–28)
BUN: 12 mg/dL (ref 8–27)
Bilirubin Total: <0.2 mg/dL (ref 0.0–1.2)
CO2: 19 mmol/L — ABNORMAL LOW (ref 20–29)
Calcium: 10 mg/dL (ref 8.7–10.3)
Chloride: 102 mmol/L (ref 96–106)
Creatinine, Ser: 0.89 mg/dL (ref 0.57–1.00)
Globulin, Total: 3 g/dL (ref 1.5–4.5)
Glucose: 96 mg/dL (ref 70–99)
Potassium: 3.7 mmol/L (ref 3.5–5.2)
Sodium: 140 mmol/L (ref 134–144)
Total Protein: 7.1 g/dL (ref 6.0–8.5)
eGFR: 69 mL/min/1.73 (ref >59)

## 2024-08-04 LAB — CBC WITH DIFFERENTIAL/PLATELET
Basophils Absolute: 0.1 x10E3/uL (ref 0.0–0.2)
Basos: 1 %
EOS (ABSOLUTE): 0.3 x10E3/uL (ref 0.0–0.4)
Eos: 3 %
Hematocrit: 40.3 % (ref 34.0–46.6)
Hemoglobin: 12.9 g/dL (ref 11.1–15.9)
Immature Grans (Abs): 0.1 x10E3/uL (ref 0.0–0.1)
Immature Granulocytes: 1 %
Lymphocytes Absolute: 5.5 x10E3/uL — ABNORMAL HIGH (ref 0.7–3.1)
Lymphs: 43 %
MCH: 28.7 pg (ref 26.6–33.0)
MCHC: 32 g/dL (ref 31.5–35.7)
MCV: 90 fL (ref 79–97)
Monocytes Absolute: 0.8 x10E3/uL (ref 0.1–0.9)
Monocytes: 7 %
Neutrophils Absolute: 5.9 x10E3/uL (ref 1.4–7.0)
Neutrophils: 45 %
Platelets: 461 x10E3/uL — ABNORMAL HIGH (ref 150–450)
RBC: 4.5 x10E6/uL (ref 3.77–5.28)
RDW: 15.7 % — ABNORMAL HIGH (ref 11.7–15.4)
WBC: 12.7 x10E3/uL — ABNORMAL HIGH (ref 3.4–10.8)

## 2024-08-04 LAB — HEMOGLOBIN A1C
Est. average glucose Bld gHb Est-mCnc: 134 mg/dL
Hgb A1c MFr Bld: 6.3 % — ABNORMAL HIGH (ref 4.8–5.6)

## 2024-08-04 LAB — TSH RFX ON ABNORMAL TO FREE T4: TSH: 3.5 u[IU]/mL (ref 0.450–4.500)

## 2024-08-04 LAB — LIPID PANEL
Chol/HDL Ratio: 4.9 ratio — ABNORMAL HIGH (ref 0.0–4.4)
Cholesterol, Total: 241 mg/dL — ABNORMAL HIGH (ref 100–199)
HDL: 49 mg/dL (ref >39)
LDL Chol Calc (NIH): 133 mg/dL — ABNORMAL HIGH (ref 0–99)
Triglycerides: 327 mg/dL — ABNORMAL HIGH (ref 0–149)
VLDL Cholesterol Cal: 59 mg/dL — ABNORMAL HIGH (ref 5–40)

## 2024-08-10 ENCOUNTER — Ambulatory Visit (INDEPENDENT_AMBULATORY_CARE_PROVIDER_SITE_OTHER): Admitting: Family Medicine

## 2024-08-10 ENCOUNTER — Encounter: Payer: Self-pay | Admitting: Family Medicine

## 2024-08-10 ENCOUNTER — Ambulatory Visit
Admission: RE | Admit: 2024-08-10 | Discharge: 2024-08-10 | Disposition: A | Discharge status: Home / Self Care | Source: Ambulatory Visit | Attending: Family Medicine | Admitting: Family Medicine

## 2024-08-10 ENCOUNTER — Ambulatory Visit
Admission: RE | Admit: 2024-08-10 | Discharge: 2024-08-10 | Disposition: A | Discharge status: Home / Self Care | Attending: Family Medicine | Admitting: Family Medicine

## 2024-08-10 VITALS — BP 114/68 | HR 113 | Temp 98.8°F | Resp 16 | Ht 64.0 in | Wt 152.0 lb

## 2024-08-10 DIAGNOSIS — R062 Wheezing: Secondary | ICD-10-CM | POA: Diagnosis not present

## 2024-08-10 DIAGNOSIS — R350 Frequency of micturition: Secondary | ICD-10-CM

## 2024-08-10 DIAGNOSIS — N898 Other specified noninflammatory disorders of vagina: Secondary | ICD-10-CM | POA: Diagnosis not present

## 2024-08-10 DIAGNOSIS — E7800 Pure hypercholesterolemia, unspecified: Secondary | ICD-10-CM | POA: Diagnosis not present

## 2024-08-10 NOTE — Patient Instructions (Addendum)
 Dr. Diana- [facility] pulmonologist  October 27th, 2025 @ 10am  *You are currently on the waitlist, so if they have a sooner appointment, they will contact you.     Make sure to go get your chest x-ray done at [facility]. You can walk-in for x-rays!  Imaging at [facility] [address] [place],  [place]  [address] Main: [phone]

## 2024-08-10 NOTE — Addendum Note (Signed)
 Addended by: Arby Dahir A on: 08/10/2024 02:45 PM   Modules accepted: Orders

## 2024-08-10 NOTE — Progress Notes (Signed)
 Acute Care Office Visit  Subjective:   Stephanie Hess August 06, 1951 08/10/2024  Chief Complaint  Patient presents with   Headache   Generalized Body Aches   HPI:  Discussed the use of AI scribe software for clinical note transcription with the patient, who gave verbal consent to proceed.  History of Present Illness   Stephanie Hess is a 73 year old female who presents with body aches, respiratory symptoms, and vaginal discomfort.  She has been experiencing body aches and a mild headache for about a week. Respiratory symptoms include difficulty breathing and a productive cough with thick sputum. She uses Levebitrol and various nebulizer medications, which she feels helps her symptoms. No fever is present, but she describes feeling 'really junky'.  She describes vaginal discomfort characterized by itching and a burning sensation, not associated with urination. No vaginal discharge is present. She had previously been on antibiotics for a urinary tract infection but discontinued them after five days due to itching and a rash on her skin.  She reports nerve pain in her foot that has been present for about a month. The pain was severe enough to warrant a visit to an orthopedic doctor and an emergency room visit. X-rays and lab work ruled out a tumor. She questions whether arthritis could cause the pain to radiate down her leg and mentions the possibility of shingles, although she currently has no rash.  She has a history of elevated cholesterol and previous adverse reactions to cholesterol medications, including itching and rash. She recalls a specific medication prescribed by her cardiologist that did not cause these side effects, but she did not fill the prescription due to cost.     The 10-year ASCVD risk score (Arnett DK, et al., 2019) is: 14.2%   Values used to calculate the score:     Age: 22 years     Clincally relevant sex: Female     Is Non-Hispanic African American: No      Diabetic: No     Tobacco smoker: Yes     Systolic Blood Pressure: 114 mmHg     Is BP treated: No     HDL Cholesterol: 49 mg/dL     Total Cholesterol: 241 mg/dL  The following portions of the patient's history were reviewed and updated as appropriate: past medical history, past surgical history, family history, social history, allergies, medications, and problem list.   Patient Active Problem List   Diagnosis Date Noted   Greater trochanteric pain syndrome 05/28/2024   Pneumonia of left upper lobe due to infectious organism 02/08/2024   Rhinovirus 02/08/2024   Statin intolerance 12/18/2023   Osteopenia of neck of left femur 12/18/2023   Malignant neoplasm of upper-outer quadrant of left breast in female, 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/2023   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) 07/08/2023   Aortic atherosclerosis 05/23/2023   BMI 30.0-30.9,adult 03/07/2022   Generalized anxiety disorder 08/19/2021   Chronic obstructive pulmonary disease (HCC) 07/13/2021   Sternoclavicular joint pain 04/01/2018   Nicotine  abuse 03/03/2018   Mild intermittent asthma without complication 03/03/2018   Pulmonary fibrosis (HCC) 03/03/2018   GERD (gastroesophageal reflux disease) 03/03/2018   Hyperlipidemia, mixed 05/15/2017   Vitamin D  deficiency 05/15/2017   Past Medical History:  Diagnosis Date   Allergy    Anxiety    Arthritis    Asthma 2022   Breast cancer (HCC)    Cancer (HCC)    Cataract  COPD (chronic obstructive pulmonary disease) (HCC)    Diverticula of colon    GERD (gastroesophageal reflux disease)    Heart murmur    Hyperlipidemia    Neuromuscular disorder (HCC)    Oxygen  deficiency    Personal history of radiation therapy    Renal insufficiency    2009 or 2010   Skin cancer 1995   [place]   Past Surgical History:  Procedure Laterality Date   APPENDECTOMY     AXILLARY SENTINEL NODE BIOPSY Left 08/06/2023   Procedure: AXILLARY SENTINEL NODE  BIOPSY;  Surgeon: Lane Shope, MD;  Location: [facility];  Service: General;  Laterality: Left;   BREAST BIOPSY Left 07/02/2023   US  LT BREAST BX W LOC DEV 1ST LESION IMG BX SPEC US  GUIDE 07/02/2023 [facility]   BREAST LUMPECTOMY Left 08/06/2023   BREAST LUMPECTOMY WITH RADIOFREQUENCY TAG IDENTIFICATION Left 08/06/2023   Procedure: BREAST LUMPECTOMY WITH RADIOFREQUENCY TAG IDENTIFICATION;  Surgeon: Lane Shope, MD;  Location: [facility] ORS;  Service: General;  Laterality: Left;   CESAREAN SECTION     ECTOPIC PREGNANCY SURGERY     TUBAL LIGATION     Family History  Problem Relation Age of Onset   Breast cancer Paternal Aunt    Heart attack Mother    Outpatient Medications Prior to Visit  Medication Sig Dispense Refill   acetaminophen  (TYLENOL ) 500 MG tablet Take 1,000 mg by mouth every 6 (six) hours as needed.     budesonide  (PULMICORT ) 0.5 MG/2ML nebulizer solution Take 2 mLs by nebulization 2 (two) times daily.     formoterol  (PERFOROMIST ) 20 MCG/2ML nebulizer solution INHALE 2 ML (20 MCG TOTAL) BY NEBULIZATION TWO (2) TIMES A DAY.     fosfomycin (MONUROL ) 3 g PACK Take 3 g by mouth once for 1 dose. In 72 hours, take a second dose of 3 g once. 6 g 0   ipratropium (ATROVENT ) 0.02 % nebulizer solution Take 2.5 mLs (0.5 mg total) by nebulization 4 (four) times daily. 75 mL 3   letrozole  (FEMARA ) 2.5 MG tablet TAKE 1 TABLET (2.5 MG TOTAL) BY MOUTH DAILY. START AFTER RADIATION IS COMPLETE 30 tablet 6   levalbuterol  (XOPENEX  HFA) 45 MCG/ACT inhaler INHALE 1 PUFF INTO THE LUNGS EVERY 6 HOURS AS NEEDED FOR WHEEZING. 30 each 2   lidocaine  (LIDODERM ) 5 % Place 1 patch onto the skin daily. Remove & Discard patch within 12 hours or as directed by MD     LORazepam  (ATIVAN ) 0.5 MG tablet Take 0.5-1 tablets (0.25-0.5 mg total) by mouth as needed for anxiety (for breakthrough anxiety). 30 tablet 0   omeprazole  (PRILOSEC) 20 MG capsule Take 1 capsule (20 mg total) by mouth daily. 30 capsule 2    OXYGEN  Inhale into the lungs. 2 litre at night     phenazopyridine  (PYRIDIUM ) 200 MG tablet Take 1 tablet (200 mg total) by mouth 3 (three) times daily. 6 tablet 0   sodium chloride  HYPERTONIC 3 % nebulizer solution Take 4 mLs by nebulization as needed for other. 750 mL 1   traZODone  (DESYREL ) 100 MG tablet Take 1 tablet (100 mg total) by mouth at bedtime as needed. for sleep 30 tablet 2   No facility-administered medications prior to visit.   Allergies  Allergen Reactions   Clarithromycin Nausea Only, Rash and Dermatitis    rash  clarithromycin   Penicillins Nausea And Vomiting and Other (See Comments)    Has patient had a PCN reaction causing immediate rash, facial/tongue/throat swelling, SOB or lightheadedness with  hypotension: No Has patient had a PCN reaction causing severe rash involving mucus membranes or skin necrosis: No Has patient had a PCN reaction that required hospitalization: No Has patient had a PCN reaction occurring within the last 10 years: Yes If all of the above answers are NO, then may proceed with Cephalosporin use.    Shellfish Allergy Nausea And Vomiting   Sulfa Antibiotics Rash and Other (See Comments)    Other reaction(s): Unknown   Ciprofloxacin  Itching   Doxycycline  Dermatitis   Amoxicillin-Pot Clavulanate Nausea And Vomiting and Nausea Only   Ezetimibe  Other (See Comments)    congestion  ezetimibe    Iodinated Contrast Media Other (See Comments)    acute renal insufficiency acute renal insufficiency   Atorvastatin  Rash and Dermatitis   Nitrofurantoin Rash and Dermatitis   ROS: A complete ROS was performed with pertinent positives/negatives noted in the HPI. The remainder of the ROS are negative.    Objective:   Today's Vitals   08/10/24 1013  BP: 114/68  Pulse: (!) 113  Resp: 16  Temp: 98.8 F (37.1 C)  TempSrc: Oral  SpO2: 90%  Weight: 152 lb (68.9 kg)  Height: 5' 4 (1.626 m)  PainSc: 2    Physical Exam Vitals reviewed.   Constitutional:      Appearance: Normal appearance. She is well-developed.  Cardiovascular:     Rate and Rhythm: Normal rate and regular rhythm.     Pulses: Normal pulses.     Heart sounds: Normal heart sounds.  Pulmonary:     Effort: Accessory muscle usage and prolonged expiration (pursued lip breathing) present.     Breath sounds: Decreased air movement present. Examination of the right-upper field reveals wheezing. Examination of the left-upper field reveals wheezing. Examination of the right-middle field reveals wheezing. Examination of the left-middle field reveals wheezing. Examination of the right-lower field reveals wheezing and rales. Examination of the left-lower field reveals wheezing and rales. Wheezing, rhonchi and rales present.  Abdominal:     General: Bowel sounds are normal.     Palpations: Abdomen is soft.     Tenderness: There is no right CVA tenderness or left CVA tenderness.  Neurological:     Mental Status: She is alert.  Psychiatric:        Mood and Affect: Mood normal.        Behavior: Behavior normal.      Assessment & Plan:   1. Vaginal itching (Primary) Vaginal itching and burning without discharge. Differential includes yeast infection or bacterial vaginosis. Perform swab to test for yeast infection and bacterial vaginosis. - NuSwab Vaginitis Plus (VG+)  2. Urinary frequency Did not complete antibiotic course for recent UTI due to adverse reactions. Persistent infection suspected. Repeat urinalysis and urine culture to assess for persistent infection. - POCT URINALYSIS DIP (CLINITEK) - Urine Culture  3. Bilateral wheezing Acute COPD exacerbation with wheezing, tachycardia, and decreased oxygen  saturation. Potential underlying infection considered. Patient declined nebulizer treatment in the office. Order STAT chest x-ray to evaluate for infection. Patient has an appointment with [facility] pulmonologist Dr. Diana on 09/06/2024 and is currently on the waitlist.  Discussed ED precautions with patient.  - DG Chest 2 View; Future  4. Hypercholesterolemia Elevated cholesterol with previous adverse reactions to medications. Did not fill recent prescription due to cost. Reports she did not tolerate statin medications due to dermatitis.      Return in about 6 weeks (around 09/21/2024) for Physical.   Patient to reach out to office if  new, worrisome, or unresolved symptoms arise or if no improvement in patient's condition. Patient verbalized understanding and is agreeable to treatment plan. All questions answered to patient's satisfaction.   Spent 45 minutes on this patient encounter, including preparation, chart review, face-to-face counseling with patient and coordination of care, and documentation of encounter.     [name], FNP

## 2024-08-11 ENCOUNTER — Ambulatory Visit: Payer: Self-pay | Admitting: Family Medicine

## 2024-08-12 LAB — URINE CULTURE: Organism ID, Bacteria: NO GROWTH

## 2024-08-15 LAB — NUSWAB VAGINITIS PLUS (VG+)
Candida albicans, NAA: NEGATIVE
Candida glabrata, NAA: NEGATIVE
Chlamydia trachomatis, NAA: NEGATIVE
Neisseria gonorrhoeae, NAA: NEGATIVE
Trich vag by NAA: NEGATIVE

## 2024-08-23 ENCOUNTER — Ambulatory Visit: Payer: Self-pay

## 2024-08-23 NOTE — Telephone Encounter (Signed)
 FYI Only or Action Required?: FYI only for provider.  Patient was last seen in primary care on 08/10/2024 by Towana Small, FNP.  Called Nurse Triage reporting Abdominal Pain.  Symptoms began a week ago.  Interventions attempted: Other: Seen in ED on 10/8.  Symptoms are: unchanged.  Triage Disposition: No disposition on file.  Patient/caregiver understands and will follow disposition?: Yes  **Appt. scheduled for 10/14**      Copied from CRM #[id number]. Topic: Clinical - Red Word Triage >> Aug 23, 2024  4:04 PM [place] E wrote: [facility] that prompted transfer to Nurse Triage: Abdomen pain, patient was seen in ED 10/8, still having pain. Rated it a level 6 out of 10 for intensity. Answer Assessment - Initial Assessment Questions 1. LOCATION: Where does it hurt?      Lower right side   2. RADIATION: Does the pain shoot anywhere else? (e.g., chest, back)     No   3. ONSET: When did the pain begin? (e.g., minutes, hours or days ago)      X 1 week ago  4. SUDDEN: Gradual or sudden onset?     Gradual   5. PATTERN Does the pain come and go, or is it constant?     Intermittent   6. SEVERITY: How bad is the pain?  (e.g., Scale 1-10; mild, moderate, or severe)     6/10  7. RECURRENT SYMPTOM: Have you ever had this type of stomach pain before? If Yes, ask: When was the last time? and What happened that time?      Patient was seen in ED, CT scan showed inflamed colon.   8. CAUSE: What do you think is causing the stomach pain? (e.g., gallstones, recent abdominal surgery)     inflamed colon possible  9. RELIEVING/AGGRAVATING FACTORS: What makes it better or worse? (e.g., antacids, bending or twisting motion, bowel movement)       10. OTHER SYMPTOMS: Do you have any other symptoms? (e.g., back pain, diarrhea, fever, urination pain, vomiting)   Constipation last BM, last Thursday. Pt. Has not taken any laxatives or stool softeners   Patient calling  to complain of persistent abdominal pain. She was seen in the ED on 10/8 and dx. With inflammation of the colon. Appt. Set for 10/14 at [facility] location due to lack of appt. Availability at [facility] office per Epic.  Protocols used: Abdominal Pain - Female-A-AH

## 2024-08-24 ENCOUNTER — Ambulatory Visit: Admitting: Family Medicine

## 2024-08-24 ENCOUNTER — Inpatient Hospital Stay: Admitting: Family Medicine

## 2024-08-25 ENCOUNTER — Ambulatory Visit: Admitting: Family Medicine

## 2024-08-25 ENCOUNTER — Encounter: Payer: Self-pay | Admitting: Family Medicine

## 2024-08-25 VITALS — BP 100/70 | HR 87 | Resp 16 | Ht 64.0 in | Wt 149.6 lb

## 2024-08-25 DIAGNOSIS — K529 Noninfective gastroenteritis and colitis, unspecified: Secondary | ICD-10-CM

## 2024-08-25 DIAGNOSIS — R7989 Other specified abnormal findings of blood chemistry: Secondary | ICD-10-CM

## 2024-08-25 DIAGNOSIS — F411 Generalized anxiety disorder: Secondary | ICD-10-CM

## 2024-08-25 DIAGNOSIS — R110 Nausea: Secondary | ICD-10-CM

## 2024-08-25 MED ORDER — ONDANSETRON 4 MG PO TBDP: 4.0000 mg | ORAL_TABLET | Freq: Three times a day (TID) | ORAL | 0 refills | Status: DC | PRN

## 2024-08-25 MED ORDER — LORAZEPAM 0.5 MG PO TABS: 0.2500 mg | ORAL_TABLET | ORAL | 0 refills | Status: AC | PRN

## 2024-08-25 MED ORDER — LORAZEPAM 0.5 MG PO TABS: 0.5000 mg | ORAL_TABLET | Freq: Two times a day (BID) | ORAL | 1 refills | Status: DC | PRN

## 2024-08-25 NOTE — Progress Notes (Signed)
 Established Patient Office Visit  Subjective  Patient ID: Stephanie Hess, female    DOB: September 16, 1951  Age: 73 y.o. MRN: [id number]  Chief Complaint  Patient presents with   Hospitalization Follow-up    Colitis Dx in ED    Discussed the use of AI scribe software for clinical note transcription with the patient, who gave verbal consent to proceed.  History of Present Illness   Stephanie Hess is a 73 year old female who presents for an ED follow-up, with ongoing abdominal pain, nausea and vomiting. She reports about a week ago, she experienced diarrhea. She went to [facility] ED on 10/8 for the previously stated symptoms. CBC with milk leukocytosis, no anemia. CMP without electrolyte abnormalities. No AKI or LFT abnormalities. UA not consistent with UTI. Lipase negative for pancreatitis.   She has been experiencing abdominal pain, nausea, vomiting, and diarrhea since last Wednesday evening. An abdominal scan reportedly showed inflammation of the colon, and she recalls being given antibiotics at that time. Despite this, she continues to feel nauseous and vomited yesterday after eating. Her diarrhea resolved last Thursday, but she noticed blood when wiping after a bowel movement this morning, which she attributes to straining.  She describes a history of fluctuating bowel movements between diarrhea and constipation over the past two weeks. She experiences chills but no fever. She has a history of urinary symptoms that have resolved after discontinuing prednisone . She completed a course of Augmentin for her possible early colitis. CT scan results showed: Mild bowel thickening of the sigmoid colon without significant surrounding stranding, may represent early/developing colitis. Otherwise, no acute abnormalities within the abdomen or pelvis.   She requests a refill for Ativan , noting she still has some left from a previous prescription. She has not been prescribed medication for nausea but is  open to receiving it.      ROS: see HPI     Objective:    BP 100/70   Pulse 87   Resp 16   Ht 5' 4 (1.626 m)   Wt 149 lb 9.6 oz (67.9 kg)   SpO2 96%   BMI 25.68 kg/m  BP Readings from Last 3 Encounters:  08/25/24 100/70  08/10/24 114/68  07/01/24 (!) 150/80    Physical Exam Vitals reviewed.  Constitutional:      Appearance: Normal appearance.  Cardiovascular:     Rate and Rhythm: Normal rate and regular rhythm.     Pulses: Normal pulses.     Heart sounds: Normal heart sounds.  Pulmonary:     Effort: Pulmonary effort is normal. Prolonged expiration present. No tachypnea, accessory muscle usage or respiratory distress.     Breath sounds: Decreased air movement present. Decreased breath sounds and wheezing present.  Abdominal:     General: Bowel sounds are normal.     Palpations: Abdomen is soft.     Tenderness: There is abdominal tenderness in the right lower quadrant. There is no guarding or rebound. Negative signs include McBurney's sign.  Neurological:     Mental Status: She is alert.  Psychiatric:        Mood and Affect: Mood normal.        Behavior: Behavior normal.     Assessment & Plan:   1. Colitis (Primary) Colitis with associated abdominal pain, nausea, vomiting, diarrhea, and hematochezia. Symptoms of colitis persist despite Augmentin treatment. CT scan indicated mild bowel thickening. Blood work showed elevated WBC count, likely due to colonic inflammation. Order repeat blood work to  assess current status  Refer to gastroenterologist for further evaluation and management. Prescribe antiemetic for nausea. Evaluate urine to rule out UTI.  - Sed Rate (ESR) - C-reactive protein - Urinalysis, Routine w reflex microscopic - Ambulatory referral to Gastroenterology  2. Nausea Rx sent for ondansetron  for acute nausea. Advised patient to remain adequately hydrated.  - ondansetron  (ZOFRAN -ODT) 4 MG disintegrating tablet; Take 1 tablet (4 mg total) by mouth every  8 (eight) hours as needed for nausea or vomiting.  Dispense: 20 tablet; Refill: 0  3. Abnormal CBC Will repeat CBC and UA with microscopy.  - CBC with Differential/Platelet - Urinalysis, Routine w reflex microscopic  4. Generalized anxiety disorder PDMP reviewed, no red flags. Sent Ativan  prescription for patient to pick-up after 08/30/2024.  - LORazepam  (ATIVAN ) 0.5 MG tablet; Take 0.5-1 tablets (0.25-0.5 mg total) by mouth as needed for anxiety (for breakthrough anxiety).  Dispense: 30 tablet; Refill: 0 - LORazepam  (ATIVAN ) 0.5 MG tablet; Take 1 tablet (0.5 mg total) by mouth 2 (two) times daily as needed for anxiety.  Dispense: 30 tablet; Refill: 1   Return in about 8 weeks (around 10/20/2024).   Spent 45 minutes on this patient encounter, including preparation, chart review, face-to-face counseling with patient and coordination of care, and documentation of encounter.    Stephanie Arts, FNP

## 2024-08-25 NOTE — Patient Instructions (Signed)

## 2024-08-26 ENCOUNTER — Ambulatory Visit: Payer: Self-pay | Admitting: Family Medicine

## 2024-08-26 LAB — URINALYSIS, ROUTINE W REFLEX MICROSCOPIC
Bilirubin, UA: NEGATIVE
Glucose, UA: NEGATIVE
Ketones, UA: NEGATIVE
Leukocytes,UA: NEGATIVE
Nitrite, UA: NEGATIVE
Protein,UA: NEGATIVE
RBC, UA: NEGATIVE
Specific Gravity, UA: 1.008 (ref 1.005–1.030)
Urobilinogen, Ur: 0.2 mg/dL (ref 0.2–1.0)
pH, UA: 6.5 (ref 5.0–7.5)

## 2024-08-26 LAB — CBC WITH DIFFERENTIAL/PLATELET
Basophils Absolute: 0.1 x10E3/uL (ref 0.0–0.2)
Basos: 1 %
EOS (ABSOLUTE): 0.3 x10E3/uL (ref 0.0–0.4)
Eos: 3 %
Hematocrit: 37.1 % (ref 34.0–46.6)
Hemoglobin: 12 g/dL (ref 11.1–15.9)
Immature Grans (Abs): 0 x10E3/uL (ref 0.0–0.1)
Immature Granulocytes: 0 %
Lymphocytes Absolute: 3.4 x10E3/uL — ABNORMAL HIGH (ref 0.7–3.1)
Lymphs: 34 %
MCH: 28.6 pg (ref 26.6–33.0)
MCHC: 32.3 g/dL (ref 31.5–35.7)
MCV: 89 fL (ref 79–97)
Monocytes Absolute: 0.9 x10E3/uL (ref 0.1–0.9)
Monocytes: 9 %
Neutrophils Absolute: 5.2 x10E3/uL (ref 1.4–7.0)
Neutrophils: 53 %
Platelets: 378 x10E3/uL (ref 150–450)
RBC: 4.19 x10E6/uL (ref 3.77–5.28)
RDW: 14.5 % (ref 11.7–15.4)
WBC: 9.9 x10E3/uL (ref 3.4–10.8)

## 2024-08-26 LAB — C-REACTIVE PROTEIN: CRP: 7 mg/L (ref 0–10)

## 2024-08-26 LAB — SEDIMENTATION RATE: Sed Rate: 61 mm/h — ABNORMAL HIGH (ref 0–40)

## 2024-09-01 ENCOUNTER — Ambulatory Visit
Admission: EM | Admit: 2024-09-01 | Discharge: 2024-09-01 | Disposition: A | Discharge status: Home / Self Care | Attending: Emergency Medicine | Admitting: Emergency Medicine

## 2024-09-01 ENCOUNTER — Ambulatory Visit (INDEPENDENT_AMBULATORY_CARE_PROVIDER_SITE_OTHER)

## 2024-09-01 ENCOUNTER — Ambulatory Visit (HOSPITAL_COMMUNITY): Payer: Self-pay

## 2024-09-01 ENCOUNTER — Ambulatory Visit: Payer: Self-pay

## 2024-09-01 DIAGNOSIS — M79644 Pain in right finger(s): Secondary | ICD-10-CM | POA: Diagnosis not present

## 2024-09-01 DIAGNOSIS — M19041 Primary osteoarthritis, right hand: Secondary | ICD-10-CM | POA: Diagnosis not present

## 2024-09-01 MED ORDER — DEXAMETHASONE SOD PHOSPHATE PF 10 MG/ML IJ SOLN
10.0000 mg | Freq: Once | INTRAMUSCULAR | Status: AC
  Administered 2024-09-01: 10 mg via INTRAMUSCULAR

## 2024-09-01 MED ORDER — METHYLPREDNISOLONE 4 MG PO TBPK: ORAL_TABLET | ORAL | 0 refills | Status: DC

## 2024-09-01 NOTE — ED Provider Notes (Addendum)
 [facility]    CSN: [id number] Arrival date & time: 09/01/24  1157      History   Chief Complaint Chief Complaint  Patient presents with   Hand Pain    HPI Stephanie Hess is a 73 y.o. female.   HPI  73 year old female with past medical history significant for diverticulosis, GERD, renal insufficiency, hyperlipidemia, COPD, breast cancer, asthma, anxiety, heart murmur, neuromuscular disorder, and arthritis presents for evaluation of acute pain, swelling, and redness to the MCP joint of the right index finger.  No known trauma.  No numbness or tingling in the finger.  No history of gout.  Past Medical History:  Diagnosis Date   Allergy    Anxiety    Arthritis    Asthma 2022   Breast cancer (HCC)    Cancer (HCC)    Cataract    COPD (chronic obstructive pulmonary disease) (HCC)    Diverticula of colon    GERD (gastroesophageal reflux disease)    Heart murmur    Hyperlipidemia    Neuromuscular disorder (HCC)    Oxygen  deficiency    Personal history of radiation therapy    Renal insufficiency    2009 or 2010   Skin cancer 1995   [place]    Patient Active Problem List   Diagnosis Date Noted   Greater trochanteric pain syndrome 05/28/2024   Pneumonia of left upper lobe due to infectious organism 02/08/2024   Statin intolerance 12/18/2023   Osteopenia of neck of left femur 12/18/2023   Malignant neoplasm of upper-outer quadrant of left breast in female, 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/2023   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) 07/08/2023   Aortic atherosclerosis 05/23/2023   BMI 30.0-30.9,adult 03/07/2022   Generalized anxiety disorder 08/19/2021   Chronic obstructive pulmonary disease (HCC) 07/13/2021   Sternoclavicular joint pain 04/01/2018   Nicotine  abuse 03/03/2018   Mild intermittent asthma without complication 03/03/2018   Pulmonary fibrosis (HCC) 03/03/2018   GERD (gastroesophageal reflux disease) 03/03/2018    Hyperlipidemia, mixed 05/15/2017   Vitamin D  deficiency 05/15/2017    Past Surgical History:  Procedure Laterality Date   APPENDECTOMY     AXILLARY SENTINEL NODE BIOPSY Left 08/06/2023   Procedure: AXILLARY SENTINEL NODE BIOPSY;  Surgeon: Lane Shope, MD;  Location: [facility];  Service: General;  Laterality: Left;   BREAST BIOPSY Left 07/02/2023   US  LT BREAST BX W LOC DEV 1ST LESION IMG BX SPEC US  GUIDE 07/02/2023 [facility]   BREAST LUMPECTOMY Left 08/06/2023   BREAST LUMPECTOMY WITH RADIOFREQUENCY TAG IDENTIFICATION Left 08/06/2023   Procedure: BREAST LUMPECTOMY WITH RADIOFREQUENCY TAG IDENTIFICATION;  Surgeon: Lane Shope, MD;  Location: [facility];  Service: General;  Laterality: Left;   CESAREAN SECTION     ECTOPIC PREGNANCY SURGERY     TUBAL LIGATION      OB History     Gravida  4   Para  4   Term  2   Preterm      AB      Living  2      SAB      IAB      Ectopic      Multiple      Live Births               Home Medications    Prior to Admission medications   Medication Sig Start Date End Date Taking? Authorizing Provider  budesonide  (PULMICORT ) 0.5 MG/2ML nebulizer solution Take 2 mLs  by nebulization 2 (two) times daily. 05/11/23  Yes [provider]  formoterol  (PERFOROMIST ) 20 MCG/2ML nebulizer solution INHALE 2 ML (20 MCG TOTAL) BY NEBULIZATION TWO (2) TIMES A DAY. 09/18/21  Yes [provider]  ipratropium (ATROVENT ) 0.02 % nebulizer solution Take 2.5 mLs (0.5 mg total) by nebulization 4 (four) times daily. 08/08/21  Yes Boscia, Heather E, NP  letrozole  (FEMARA ) 2.5 MG tablet TAKE 1 TABLET (2.5 MG TOTAL) BY MOUTH DAILY. START AFTER RADIATION IS COMPLETE 06/11/24  Yes Melanee Annah BROCKS, MD  levalbuterol  (XOPENEX  HFA) 45 MCG/ACT inhaler INHALE 1 PUFF INTO THE LUNGS EVERY 6 HOURS AS NEEDED FOR WHEEZING. 02/20/23  Yes Boscia, Heather E, NP  lidocaine  (LIDODERM ) 5 % Place 1 patch onto the skin daily. Remove & Discard patch within  12 hours or as directed by MD   Yes [provider]  methylPREDNISolone  (MEDROL  DOSEPAK) 4 MG TBPK tablet Take according to the package insert. 09/01/24  Yes Bernardino Ditch, NP  Oxycodone  HCl 10 MG TABS Take 10 mg by mouth every 8 (eight) hours as needed. 08/19/24  Yes [provider]  OXYGEN  Inhale into the lungs. 2 litre at night   Yes [provider]  acetaminophen  (TYLENOL ) 500 MG tablet Take 1,000 mg by mouth every 6 (six) hours as needed.    [provider]  LORazepam  (ATIVAN ) 0.5 MG tablet Take 0.5-1 tablets (0.25-0.5 mg total) by mouth as needed for anxiety (for breakthrough anxiety). 09/01/24 10/01/24  Butler, Kristina, FNP  LORazepam  (ATIVAN ) 0.5 MG tablet Take 1 tablet (0.5 mg total) by mouth 2 (two) times daily as needed for anxiety. 10/31/24 11/30/24  Towana Small, FNP  omeprazole  (PRILOSEC) 20 MG capsule Take 1 capsule (20 mg total) by mouth daily. 02/18/24   Towana Small, FNP  ondansetron  (ZOFRAN -ODT) 4 MG disintegrating tablet Take 1 tablet (4 mg total) by mouth every 8 (eight) hours as needed for nausea or vomiting. 08/25/24   Butler, Kristina, FNP  phenazopyridine  (PYRIDIUM ) 200 MG tablet Take 1 tablet (200 mg total) by mouth 3 (three) times daily. 06/30/24   Towana Small, FNP  sodium chloride  HYPERTONIC 3 % nebulizer solution Take 4 mLs by nebulization as needed for other. 02/18/24   Butler, Kristina, FNP  traZODone  (DESYREL ) 100 MG tablet Take 1 tablet (100 mg total) by mouth at bedtime as needed. for sleep 08/02/24   Ziglar, Devere POUR, MD    Family History Family History  Problem Relation Age of Onset   Breast cancer Paternal Aunt    Heart attack Mother     Social History Social History   Tobacco Use   Smoking status: Every Day    Current packs/day: 0.50    Average packs/day: 0.5 packs/day for 50.0 years (25.0 ttl pk-yrs)    Types: Cigarettes    Passive exposure: Past   Smokeless tobacco: Never  Vaping Use   Vaping status: Never  Used  Substance Use Topics   Alcohol use: Not Currently    Comment: social   Drug use: No     Allergies   Clarithromycin, Penicillins, Shellfish allergy, Sulfa antibiotics, Ciprofloxacin , Doxycycline , Amoxicillin-pot clavulanate, Ezetimibe , Iodinated contrast media, Atorvastatin , and Nitrofurantoin   Review of Systems Review of Systems  Constitutional:  Negative for fever.  Musculoskeletal:  Positive for arthralgias and joint swelling.  Skin:  Positive for color change.     Physical Exam Triage Vital Signs ED Triage Vitals  Encounter Vitals Group     BP 09/01/24 1208 133/81  Girls Systolic BP Percentile --      Girls Diastolic BP Percentile --      Boys Systolic BP Percentile --      Boys Diastolic BP Percentile --      Pulse Rate 09/01/24 1208 95     Resp 09/01/24 1208 17     Temp 09/01/24 1208 98.3 F (36.8 C)     Temp Source 09/01/24 1208 Oral     SpO2 09/01/24 1208 96 %     Weight --      Height --      Head Circumference --      Peak Flow --      Pain Score 09/01/24 1207 10     Pain Loc --      Pain Education --      Exclude from Growth Chart --    No data found.  Updated Vital Signs BP 133/81 (BP Location: Right Arm)   Pulse 95   Temp 98.3 F (36.8 C) (Oral)   Resp 17   SpO2 96%   Visual Acuity Right Eye Distance:   Left Eye Distance:   Bilateral Distance:    Right Eye Near:   Left Eye Near:    Bilateral Near:     Physical Exam Vitals and nursing note reviewed.  Constitutional:      Appearance: Normal appearance. She is not ill-appearing.  HENT:     Head: Normocephalic and atraumatic.  Musculoskeletal:        General: Swelling and tenderness present. No deformity or signs of injury.  Skin:    General: Skin is warm and dry.     Capillary Refill: Capillary refill takes less than 2 seconds.     Findings: Erythema present.  Neurological:     General: No focal deficit present.     Mental Status: She is alert and oriented to person,  place, and time.      UC Treatments / Results  Labs (all labs ordered are listed, but only abnormal results are displayed) Labs Reviewed - No data to display  EKG   Radiology DG Finger Index Right Result Date: 09/01/2024 CLINICAL DATA:  Pain, swelling, redness at MCP joint of right index finger. EXAM: RIGHT INDEX FINGER 2+V COMPARISON:  None Available. FINDINGS: Advanced osteoarthritis at the DIP joints of the right index and middle fingers. Mild osteoarthritis at DIP joints. MCP joints are maintained. No bony erosions. No acute bony abnormality. Specifically, no fracture, subluxation, or dislocation. IMPRESSION: Osteoarthritis changes at the PIP and DIP joints as described above. No acute bony abnormality. Electronically Signed   By: Franky Crease M.D.   On: 09/01/2024 13:02    Procedures Procedures (including critical care time)  Medications Ordered in UC Medications  dexamethasone  (DECADRON ) injection 10 mg (10 mg Intramuscular Given 09/01/24 1252)    Initial Impression / Assessment and Plan / UC Course  I have reviewed the triage vital signs and the nursing notes.  Pertinent labs & imaging results that were available during my care of the patient were reviewed by me and considered in my medical decision making (see chart for details).   Patient is a pleasant, nontoxic-appearing 73 year old female presenting for evaluation of pain, swelling, redness to the MCP joint of her right index finger that started last night.  No known injury.  As you can see in image above, there is mild erythema noted to the MCP joint.  She has no tenderness with palpation of the remainder of the  index finger but she is tender over the MCP joint as well as the distal aspect of the first metacarpal.  She has no history of gout but she does have a history of osteoarthritis.  She has taken Tylenol  and ibuprofen  without improvement of symptoms.  I suspect this is most likely an arthritis flare.  Given that  she is afebrile I do not suspect septic arthritis at this time.  I will obtain a radiograph of the right index finger to evaluate for any arthropathy.  Right index finger x-rays independently reviewed and evaluated by me.  Impression: There is a small radiolucency medial to the MCP joint which may represent a small avulsion fracture though the edges are smooth and this does not appear to be acute.  Degenerative changes noted throughout the joints of the index finger.  Radiology overread is pending. Radiology impression states osteoarthritis of the DIP and PIP joints.  No other bony abnormality noted.  I will discharge patient home with a diagnosis of arthritis of the right index finger and start her on a Medrol  Dosepak to help with pain.  She may also apply moist heat to help improve blood flow and aid in healing.  If her symptoms do not improve she may either return for reevaluation or follow-up with orthopedics.  Patient reports that she would prefer to have an injection of steroids here in clinic and start Medrol  Dosepak in the morning.  10 mg of IM Decadron  has been ordered.   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  You do have degenerative changes throughout the joints of your index finger.  I suspect that you are experiencing an osteoarthritis flare.  Take the Medrol  Dosepak according to the package instructions to help with pain and inflammation.  You may apply moist heat to your finger for 20 minutes at a time, 2-3 times a day, to help with pain and inflammation.  If your symptoms do not improve either return for reevaluation, see your primary care provider, or follow-up with orthopedics such as [facility] here in [place] or in [place].     ED Prescriptions      Medication Sig Dispense Auth. Provider   methylPREDNISolone  (MEDROL  DOSEPAK) 4 MG TBPK tablet Take according to the package insert. 1 each Bernardino Ditch, NP      PDMP not reviewed this encounter.   Bernardino Ditch, NP 09/01/24 1246    Bernardino Ditch, NP 09/01/24 1248    Bernardino Ditch, NP 09/01/24 1330

## 2024-09-01 NOTE — Discharge Instructions (Addendum)
 Your x-rays did not show any evidence of dislocation or acute fractures.  You do have degenerative changes throughout the joints of your index finger.  I suspect that you are experiencing an osteoarthritis flare.  Take the Medrol  Dosepak according to the package instructions to help with pain and inflammation.  You may apply moist heat to your finger for 20 minutes at a time, 2-3 times a day, to help with pain and inflammation.  If your symptoms do not improve either return for reevaluation, see your primary care provider, or follow-up with orthopedics such as [facility] here in [place] or in [place].

## 2024-09-01 NOTE — Telephone Encounter (Signed)
 FYI Only or Action Required?: FYI only for provider.  Patient was last seen in primary care on 08/25/2024 by Towana Small, FNP.  Called Nurse Triage reporting Hand Pain.  Symptoms began today.  Interventions attempted: Nothing.  Symptoms are: unchanged.  Triage Disposition: See Physician Within 24 Hours  Patient/caregiver understands and will follow disposition?: No appointment, going to urgent care       Copied from CRM #[id number]. Topic: Clinical - Red Word Triage >> Sep 01, 2024 11:17 AM Anairis L wrote: [facility] that prompted transfer to Nurse Triage: Bilateral hand swelling, severe pain.      Reason for Disposition  [1] MILD swelling (puffiness) of both hands AND [2] new-onset or getting worse  (Exception: Caused by hot weather or normal pregnancy swelling.)  Answer Assessment - Initial Assessment Questions 1. ONSET: When did the swelling start? (e.g., minutes, hours, days)     Today, but similar for the last few days  2. LOCATION: What part of the hand is swollen?  Are both hands swollen or just one hand?     Right hand  at knuckles  3. TYPE : What does it look like? (e.g., ball, lump; localized; hand swelling)     Localized  4. SWELLING SEVERITY: If more than a lump or localized, ask: How bad is the hand swelling? (e.g., mild, moderate, severe; describe)     Bigger than a quarter  5. REDNESS: Is there redness or signs of infection?     Redness present  6. PAIN: Is the swelling painful to touch? If Yes, ask: How painful is it?   (Scale 1-10; mild, moderate or severe)     Moderate to severe  7. FEVER: Do you have a fever? If Yes, ask: What is it, how was it measured, and when did it start?      No 8. CAUSE: What do you think is causing the hand swelling? (e.g., heat, insect bite, pregnancy, recent injury)     Unsure if it's arthritis or gout  9. MEDICAL HISTORY: Do you have a history of heart failure, kidney disease, liver failure, or  cancer?     No 10. RECURRENT SYMPTOM: Have you had hand swelling before? If Yes, ask: When was the last time? What happened that time?       No 11. OTHER SYMPTOMS: Do you have any other symptoms? (e.g., blurred vision, difficulty breathing, headache)       Some abdominal pain  Protocols used: Hand Swelling-A-AH

## 2024-09-01 NOTE — ED Triage Notes (Signed)
 Patient states that her right point finger and knuckle are swollen and red. Started last night

## 2024-09-21 ENCOUNTER — Encounter: Admitting: Family Medicine

## 2024-09-21 DIAGNOSIS — N390 Urinary tract infection, site not specified: Secondary | ICD-10-CM | POA: Insufficient documentation

## 2024-09-21 DIAGNOSIS — M792 Neuralgia and neuritis, unspecified: Secondary | ICD-10-CM | POA: Insufficient documentation

## 2024-09-22 NOTE — Progress Notes (Signed)
 09/23/2024 Stephanie Hess [id number] Feb 07, 1951  Gastroenterology Office Note    Referring Provider: Towana Small, FNP Primary Care Physician:  Towana Small, FNP  Primary GI Provider: Jinny Carmine, MD    Chief Complaint   Chief Complaint  Patient presents with   New Patient (Initial Visit)    Diverticulitis-abd pain-nausea-only took ABX for 2-3 days and stopped-     History of Present Illness   Stephanie Hess is a 73 y.o. female with PMHX of gERD, COPD, Diverticulitis presenting today at the request of Towana Small, FNP due to  Patient reports she was just diagnosed with diverticulitis at [facility] and was prescribed antibiotics.  She thinks she had mild itching and stopped taking the antibiotics after 3 days.  Patient is still having right and left lower abdominal pain.  Reports that she drank some juice yesterday and had to vomit once, no further episodes of vomiting.  She is having intermittent fever and chills. Last bowel movement was this morning, it was soft mushy but not diarrhea. Denies melena or hematochezia.  States the abdominal pain has slightly improved since she went to the emergency room, rates pain 3/10.    Patient seen at [facility] emergency room on 09/12/2024 with abdominal pain and constipation for 3 days. WBC 17.5 Treated with cipro  and metronidazole .    09/12/2024 CT Abd pelvis with contrast: acute uncomplicated diverticulitis.   Patient seen by PCP on 08/25/2024 for colitis with associated abdominal pain, nausea, vomiting, diarrhea, and hematochezia.  Patient seen at [facility] emergency room on 08/18/2024 with abdominal pain, nausea, vomiting.    08/18/2024 CT Abdomen/pelvis with contrast: Mild bowel thickening of the sigmoid colon without significant surrounding stranding, may represent early/developing colitis. Otherwise, no acute abnormalities within the abdomen or pelvis.   Past Medical History:  Diagnosis Date   Allergy    Anxiety    Arthritis     Asthma 2022   Breast cancer (HCC)    Cancer (HCC)    Cataract    COPD (chronic obstructive pulmonary disease) (HCC)    Diverticula of colon    GERD (gastroesophageal reflux disease)    Heart murmur    Hyperlipidemia    Nerve pain 09/21/2024   Neuromuscular disorder (HCC)    Oxygen  deficiency    Personal history of radiation therapy    Renal insufficiency    2009 or 2010   Skin cancer 1995   [place]   UTI (urinary tract infection) 09/21/2024    Past Surgical History:  Procedure Laterality Date   APPENDECTOMY     AXILLARY SENTINEL NODE BIOPSY Left 08/06/2023   Procedure: AXILLARY SENTINEL NODE BIOPSY;  Surgeon: Lane Shope, MD;  Location: [facility] ORS;  Service: General;  Laterality: Left;   BREAST BIOPSY Left 07/02/2023   US  LT BREAST BX W LOC DEV 1ST LESION IMG BX SPEC US  GUIDE 07/02/2023 [facility]   BREAST LUMPECTOMY Left 08/06/2023   BREAST LUMPECTOMY WITH RADIOFREQUENCY TAG IDENTIFICATION Left 08/06/2023   Procedure: BREAST LUMPECTOMY WITH RADIOFREQUENCY TAG IDENTIFICATION;  Surgeon: Lane Shope, MD;  Location: [facility] ORS;  Service: General;  Laterality: Left;   CESAREAN SECTION     ECTOPIC PREGNANCY SURGERY     TUBAL LIGATION      Current Outpatient Medications  Medication Sig Dispense Refill   acetaminophen  (TYLENOL ) 500 MG tablet Take 1,000 mg by mouth every 6 (six) hours as needed.     budesonide  (PULMICORT ) 0.5 MG/2ML nebulizer solution Take 2 mLs by nebulization 2 (two) times  daily.     formoterol  (PERFOROMIST ) 20 MCG/2ML nebulizer solution INHALE 2 ML (20 MCG TOTAL) BY NEBULIZATION TWO (2) TIMES A DAY.     ipratropium (ATROVENT ) 0.02 % nebulizer solution Take 2.5 mLs (0.5 mg total) by nebulization 4 (four) times daily. 75 mL 3   letrozole  (FEMARA ) 2.5 MG tablet TAKE 1 TABLET (2.5 MG TOTAL) BY MOUTH DAILY. START AFTER RADIATION IS COMPLETE 30 tablet 6   lidocaine  (LIDODERM ) 5 % Place 1 patch onto the skin daily. Remove & Discard patch within 12 hours or as  directed by MD     LORazepam  (ATIVAN ) 0.5 MG tablet Take 0.5-1 tablets (0.25-0.5 mg total) by mouth as needed for anxiety (for breakthrough anxiety). 30 tablet 0   omeprazole  (PRILOSEC) 20 MG capsule Take 1 capsule (20 mg total) by mouth daily. 30 capsule 2   ondansetron  (ZOFRAN -ODT) 4 MG disintegrating tablet Take 1 tablet (4 mg total) by mouth every 8 (eight) hours as needed for nausea or vomiting. 20 tablet 0   Oxycodone  HCl 10 MG TABS Take 10 mg by mouth every 8 (eight) hours as needed.     OXYGEN  Inhale into the lungs. 2 litre at night     sodium chloride  HYPERTONIC 3 % nebulizer solution Take 4 mLs by nebulization as needed for other. 750 mL 1   traZODone  (DESYREL ) 100 MG tablet Take 1 tablet (100 mg total) by mouth at bedtime as needed. for sleep 30 tablet 2   No current facility-administered medications for this visit.    Allergies as of 09/23/2024 - Review Complete 09/23/2024  Allergen Reaction Noted   Clarithromycin Nausea Only, Rash, and Dermatitis 03/01/2016   Penicillins Nausea And Vomiting and Other (See Comments) 03/01/2016   Shellfish allergy Nausea And Vomiting 03/01/2016   Sulfa antibiotics Rash and Other (See Comments) 09/19/2014   Ciprofloxacin  Itching 08/10/2024   Doxycycline  Dermatitis 08/10/2024   Amoxicillin-pot clavulanate Nausea And Vomiting and Nausea Only 09/21/2014   Ezetimibe  Other (See Comments) 06/11/2024   Iodinated contrast media Other (See Comments) 09/19/2014   Atorvastatin  Rash and Dermatitis 03/18/2017   Nitrofurantoin Rash and Dermatitis 05/06/2016    Family History  Problem Relation Age of Onset   Breast cancer Paternal Aunt    Heart attack Mother     Social History   Socioeconomic History   Marital status: Divorced    Spouse name: Not on file   Number of children: Not on file   Years of education: Not on file   Highest education level: 12th grade  Occupational History   Not on file  Tobacco Use   Smoking status: Every Day     Current packs/day: 0.50    Average packs/day: 0.5 packs/day for 50.0 years (25.0 ttl pk-yrs)    Types: Cigarettes    Passive exposure: Past   Smokeless tobacco: Never  Vaping Use   Vaping status: Never Used  Substance and Sexual Activity   Alcohol use: Not Currently    Comment: social   Drug use: No   Sexual activity: Not Currently    Birth control/protection: None  Other Topics Concern   Not on file  Social History Narrative   Not on file   Social Drivers of Health   Financial Resource Strain: Medium Risk (08/23/2024)   Overall Financial Resource Strain (CARDIA)    Difficulty of Paying Living Expenses: Somewhat hard  Food Insecurity: No Food Insecurity (08/23/2024)   Hunger Vital Sign    Worried About Programme Researcher, Broadcasting/film/video in  the Last Year: Never true    Ran Out of Food in the Last Year: Never true  Transportation Needs: No Transportation Needs (08/23/2024)   PRAPARE - Administrator, Civil Service (Medical): No    Lack of Transportation (Non-Medical): No  Physical Activity: Inactive (08/23/2024)   Exercise Vital Sign    Days of Exercise per Week: 0 days    Minutes of Exercise per Session: Not on file  Stress: No Stress Concern Present (08/23/2024)   [employer] of Occupational Health - Occupational Stress Questionnaire    Feeling of Stress: Only a little  Social Connections: Moderately Isolated (08/23/2024)   Social Connection and Isolation Panel    Frequency of Communication with Friends and Family: More than three times a week    Frequency of Social Gatherings with Friends and Family: Patient declined    Attends Religious Services: 1 to 4 times per year    Active Member of Clubs or Organizations: No    Attends Banker Meetings: Not on file    Marital Status: Divorced  Intimate Partner Violence: Not At Risk (09/11/2024)   Received from [facility]   Humiliation, Afraid, Rape, and Kick questionnaire    Within the last year, have you  been afraid of your partner or ex-partner?: No    Within the last year, have you been humiliated or emotionally abused in other ways by your partner or ex-partner?: No    Within the last year, have you been kicked, hit, slapped, or otherwise physically hurt by your partner or ex-partner?: No    Within the last year, have you been raped or forced to have any kind of sexual activity by your partner or ex-partner?: No     RELEVANT GI HISTORY, IMAGING AND LABS: CBC    Component Value Date/Time   WBC 9.9 08/25/2024 1537   WBC 10.6 (H) 10/06/2023 1132   WBC 16.7 (H) 07/14/2021 0515   RBC 4.19 08/25/2024 1537   RBC 4.63 10/06/2023 1132   HGB 12.0 08/25/2024 1537   HCT 37.1 08/25/2024 1537   PLT 378 08/25/2024 1537   MCV 89 08/25/2024 1537   MCV 90 02/23/2014 1055   MCH 28.6 08/25/2024 1537   MCH 30.2 10/06/2023 1132   MCHC 32.3 08/25/2024 1537   MCHC 32.9 10/06/2023 1132   RDW 14.5 08/25/2024 1537   RDW 13.3 02/23/2014 1055   LYMPHSABS 3.4 (H) 08/25/2024 1537   LYMPHSABS 3.5 02/23/2014 1055   MONOABS 0.1 07/14/2021 0515   MONOABS 1.1 (H) 02/23/2014 1055   EOSABS 0.3 08/25/2024 1537   EOSABS 0.2 02/23/2014 1055   BASOSABS 0.1 08/25/2024 1537   BASOSABS 0.1 02/23/2014 1055   Recent Labs    10/06/23 1132 12/19/23 0856 08/03/24 0810 08/25/24 1537  HGB 14.0 12.7 12.9 12.0    CMP     Component Value Date/Time   NA 140 08/03/2024 0810   NA 139 08/11/2013 0924   K 3.7 08/03/2024 0810   K 4.3 08/11/2013 0924   CL 102 08/03/2024 0810   CL 107 08/11/2013 0924   CO2 19 (L) 08/03/2024 0810   CO2 26 08/11/2013 0924   GLUCOSE 96 08/03/2024 0810   GLUCOSE 95 01/14/2024 1109   GLUCOSE 89 08/11/2013 0924   BUN 12 08/03/2024 0810   BUN 12 08/11/2013 0924   CREATININE 0.89 08/03/2024 0810   CREATININE 0.89 01/14/2024 1109   CREATININE 0.87 08/11/2013 0924   CALCIUM  10.0 08/03/2024 0810  CALCIUM  9.6 08/11/2013 0924   PROT 7.1 08/03/2024 0810   PROT 8.1 08/11/2013 0924    ALBUMIN 4.1 08/03/2024 0810   ALBUMIN 4.0 08/11/2013 0924   AST 15 08/03/2024 0810   AST 26 01/14/2024 1109   ALT 14 08/03/2024 0810   ALT 22 01/14/2024 1109   ALT 47 08/11/2013 0924   ALKPHOS 66 08/03/2024 0810   ALKPHOS 101 08/11/2013 0924   BILITOT <0.2 08/03/2024 0810   BILITOT 0.6 01/14/2024 1109   GFRNONAA >60 01/14/2024 1109   GFRNONAA >60 08/11/2013 0924   GFRAA 106 08/12/2018 1536   GFRAA >60 08/11/2013 0924      Latest Ref Rng & Units 08/03/2024    8:10 AM 01/14/2024   11:09 AM 12/19/2023    8:54 AM  Hepatic Function  Total Protein 6.0 - 8.5 g/dL 7.1  7.6  6.8   Albumin 3.8 - 4.8 g/dL 4.1  3.9  4.1   AST 0 - 40 IU/L 15  26  19    ALT 0 - 32 IU/L 14  22  21    Alk Phosphatase 49 - 135 IU/L 66  53  69   Total Bilirubin 0.0 - 1.2 mg/dL <9.7  0.6  <9.7       Review of Systems   All systems reviewed and negative except where noted in HPI.    Physical Exam  BP 120/74   Pulse (!) 111   Temp 98.5 F (36.9 C)   Ht 5' 4 (1.626 m)   Wt 143 lb 3.2 oz (65 kg)   SpO2 94%   BMI 24.58 kg/m  No LMP recorded. Patient is postmenopausal. General:   Alert and oriented. Pleasant and cooperative. Well-nourished and well-developed.  Head:  Normocephalic and atraumatic. Eyes:  Without icterus Ears:  Normal auditory acuity. Neck:  Supple; no masses or thyromegaly. Lungs:  Respirations even and unlabored.  Clear throughout to auscultation.   No wheezes, crackles, or rhonchi. No acute distress. Heart:  Regular rate and rhythm; no murmurs, clicks, rubs, or gallops. Abdomen:  Normal bowel sounds.  No bruits.  Soft, non-tender and non-distended without masses, hepatosplenomegaly or hernias noted.  No guarding or rebound tenderness.    Rectal:  Deferred. Msk:  Symmetrical without gross deformities. Normal posture. Extremities:  Without edema. Neurologic:  Alert and  oriented x4;  grossly normal neurologically. Skin:  Intact without significant lesions or rashes. Psych:  Alert and  cooperative. Normal mood and affect.   Assessment & Plan   Stephanie Hess is a 73 y.o. female presenting today for follow-up for diverticulitis.  Continues to have abdominal pain.   Diverticulitis. Patient did not finish therapy as prescribed due to mild itching. Still having abdominal pain. Discussed importance of not stopping antibiotics unless moderate/severe reaction.  - order CBC - STAT CT abd pelvis with contrast  - will treat pending CT results - Discussed that patient will need colonoscopy in 6-8 weeks following treatment.   Grayce Bohr, DNP, AGNP-C [facility]

## 2024-09-23 ENCOUNTER — Encounter: Payer: Self-pay | Admitting: Family Medicine

## 2024-09-23 ENCOUNTER — Ambulatory Visit
Admission: RE | Admit: 2024-09-23 | Discharge: 2024-09-23 | Disposition: A | Discharge status: Home / Self Care | Source: Ambulatory Visit | Attending: Family Medicine | Admitting: Family Medicine

## 2024-09-23 ENCOUNTER — Other Ambulatory Visit
Admission: RE | Admit: 2024-09-23 | Discharge: 2024-09-23 | Disposition: A | Source: Ambulatory Visit | Attending: Family Medicine | Admitting: Family Medicine

## 2024-09-23 ENCOUNTER — Ambulatory Visit: Admitting: Family Medicine

## 2024-09-23 VITALS — BP 120/74 | HR 111 | Temp 98.5°F | Ht 64.0 in | Wt 143.2 lb

## 2024-09-23 DIAGNOSIS — K5732 Diverticulitis of large intestine without perforation or abscess without bleeding: Secondary | ICD-10-CM | POA: Insufficient documentation

## 2024-09-23 DIAGNOSIS — R1030 Lower abdominal pain, unspecified: Secondary | ICD-10-CM | POA: Diagnosis not present

## 2024-09-23 LAB — CBC WITH DIFFERENTIAL/PLATELET
Abs Immature Granulocytes: 0.04 K/uL (ref 0.00–0.07)
Basophils Absolute: 0.1 K/uL (ref 0.0–0.1)
Basophils Relative: 1 %
Eosinophils Absolute: 0.3 K/uL (ref 0.0–0.5)
Eosinophils Relative: 3 %
HCT: 36.3 % (ref 36.0–46.0)
Hemoglobin: 11.9 g/dL — ABNORMAL LOW (ref 12.0–15.0)
Immature Granulocytes: 0 %
Lymphocytes Relative: 31 %
Lymphs Abs: 3.3 K/uL (ref 0.7–4.0)
MCH: 28.7 pg (ref 26.0–34.0)
MCHC: 32.8 g/dL (ref 30.0–36.0)
MCV: 87.7 fL (ref 80.0–100.0)
Monocytes Absolute: 0.7 K/uL (ref 0.1–1.0)
Monocytes Relative: 7 %
Neutro Abs: 6.1 K/uL (ref 1.7–7.7)
Neutrophils Relative %: 58 %
Platelets: 509 K/uL — ABNORMAL HIGH (ref 150–400)
RBC: 4.14 MIL/uL (ref 3.87–5.11)
RDW: 14.4 % (ref 11.5–15.5)
WBC: 10.5 K/uL (ref 4.0–10.5)
nRBC: 0 % (ref 0.0–0.2)

## 2024-09-23 MED ORDER — IOHEXOL 300 MG/ML  SOLN
100.0000 mL | Freq: Once | INTRAMUSCULAR | Status: AC | PRN
  Administered 2024-09-23: 100 mL via INTRAVENOUS

## 2024-09-23 MED ORDER — IOHEXOL 9 MG/ML PO SOLN: 500.0000 mL | ORAL | Status: DC

## 2024-09-23 NOTE — Patient Instructions (Addendum)
 STAT CT Abdomen and Pelvis ordered today. Please go directly to [facility] entrance.

## 2024-09-24 ENCOUNTER — Ambulatory Visit: Payer: Self-pay | Admitting: Family Medicine

## 2024-09-24 MED ORDER — DICYCLOMINE HCL 10 MG PO CAPS: 10.0000 mg | ORAL_CAPSULE | Freq: Three times a day (TID) | ORAL | 0 refills | Status: DC

## 2024-09-24 NOTE — Telephone Encounter (Signed)
 Patient notified.  CT abdomen pelvis with contrast with no acute findings.  Diverticulosis but without acute diverticulitis.    Dorothe, please call the patient and let her know that the CT scan did not show acute diverticulitis.  She is most likely experiencing postinfectious abdominal pain.  I have sent in dicyclomine to see if that can help with the abdominal pain.  Also let her know that her white blood cell count was also now within normal limits, reassuring no signs of infection currently.   Thanks   Grayce Bohr, DNP, AGNP-C

## 2024-10-11 ENCOUNTER — Inpatient Hospital Stay: Admitting: *Deleted

## 2024-10-11 DIAGNOSIS — Z9189 Other specified personal risk factors, not elsewhere classified: Secondary | ICD-10-CM

## 2024-10-11 DIAGNOSIS — C50412 Malignant neoplasm of upper-outer quadrant of left female breast: Secondary | ICD-10-CM

## 2024-10-11 NOTE — Progress Notes (Signed)
 SUBJECTIVE: Pt returns for her 3 month L-Dex screen.    PAIN:  Are you having pain? No   SOZO SCREENING: Patient was assessed today using the SOZO machine to determine the lymphedema index score. This was compared to her baseline score. It was determined that she is within the recommended range when compared to her baseline and no further action is needed at this time. She will continue SOZO screenings. These are done every 3 months for 2 years post operatively followed by every 6 months for 2 years, and then annually.     L-DEX FLOWSHEETS                L-DEX LYMPHEDEMA SCREENING    Measurement Type Unilateral     L-DEX MEASUREMENT EXTREMITY Upper Extremity     POSITION  Standing     DOMINANT SIDE Right     At Risk Side Left     BASELINE SCORE (UNILATERAL) -1.1    L-DEX SCORE (UNILATERAL) -0.2    VALUE CHANGE (UNILAT) 0.9

## 2024-10-20 ENCOUNTER — Ambulatory Visit (INDEPENDENT_AMBULATORY_CARE_PROVIDER_SITE_OTHER): Admitting: Family Medicine

## 2024-10-20 ENCOUNTER — Encounter: Payer: Self-pay | Admitting: Family Medicine

## 2024-10-20 DIAGNOSIS — R630 Anorexia: Secondary | ICD-10-CM | POA: Diagnosis not present

## 2024-10-20 DIAGNOSIS — K582 Mixed irritable bowel syndrome: Secondary | ICD-10-CM | POA: Diagnosis not present

## 2024-10-20 NOTE — Progress Notes (Signed)
 Established Patient Office Visit  Subjective  Patient ID: Stephanie Hess, female    DOB: 1951-08-19  Age: 73 y.o. MRN: [id number]  Chief Complaint  Patient presents with   Abdominal Pain   Discussed the use of AI scribe software for clinical note transcription with the patient, who gave verbal consent to proceed.  History of Present Illness   Stephanie Hess is a 73 year old female who presents with abdominal pain and bowel irregularities.  Her abdominal pain has improved, but she continues to experience irregular bowel movements, characterized by alternating constipation and diarrhea. She did not have a bowel movement yesterday or today, but had multiple bowel movements on Monday. Her bowel habits fluctuate between constipation, where she goes two to three days without a bowel movement, and diarrhea, occurring about once a week. She uses a laxative, [place], to aid bowel movements, which typically works the following day.  She reports that she has lost 20 pounds since May, which she attributes to a lack of appetite following a COVID-19 infection and pneumonia in May. She denies any intentional weight loss. Her appetite has decreased, and she finds it difficult to find foods that do not upset her stomach. She has not been eating much and is unsure about increasing her weight. Review of chart, the highest weight we have in the chart is 156lbs.   She underwent a CT scan which did not show any diverticulitis. She is scheduled to see a GI specialist on the 29th of this month for further evaluation, including a colonoscopy. She has not had a colonoscopy recently.     ROS: see HPI     Objective:    BP 128/82   Pulse 74   Resp 16   Ht 5' 4 (1.626 m)   Wt 142 lb (64.4 kg)   BMI 24.37 kg/m  BP Readings from Last 3 Encounters:  10/20/24 128/82  09/23/24 120/74  09/01/24 133/81    Physical Exam Vitals reviewed.  Constitutional:      Appearance: Normal appearance.   Cardiovascular:     Rate and Rhythm: Normal rate and regular rhythm.     Pulses: Normal pulses.     Heart sounds: Normal heart sounds.  Pulmonary:     Effort: Pulmonary effort is normal.     Breath sounds: Normal breath sounds.  Abdominal:     General: Bowel sounds are normal. There is no distension.     Palpations: Abdomen is soft. There is no mass.     Tenderness: There is no abdominal tenderness. There is no guarding or rebound.     Hernia: No hernia is present.  Neurological:     Mental Status: She is alert.  Psychiatric:        Mood and Affect: Mood normal.        Behavior: Behavior normal.     Assessment & Plan:   1. Irritable bowel syndrome with alternating bowel habits (Primary) Intermittent constipation with episodes of diarrhea. CT scan negative for diverticulitis. Awaiting colonoscopy. Physical exam benign today. Abdominal sounds normoactive with no tenderness to palpation. Patient has appointment with GI on 11/08/2024.   2. Decreased appetite Unintentional weight loss. Patient reports significant weight loss (she thinks 20lbs) since May of this year;  however, looking back in her chart, her weight has fluctuated but not as significantly. Could be related to previous COVID and pneumonia infections. No appetite, but weight loss not rapid. Labs negative for infection or inflammation. Advised patient  she is at a stable weight and to increase her protein intake through protein shakes. Advised patient that it is responsible to complete lung cancer screening with chest CT.    Return if symptoms worsen or fail to improve.    [name], FNP

## 2024-11-07 NOTE — Progress Notes (Unsigned)
 "   11/08/2024 Stephanie Hess [id number] 1951-05-21  Gastroenterology Office Note     Primary Care Physician:  Towana Small, FNP  Primary GI Provider: Jinny Carmine, MD    Chief Complaint   Chief Complaint  Patient presents with   Follow-up    Diverticulitis of colon    Constipation    Since 11/03/2024, have taken stool soften   Medication Management    Requesting possible Rx for Liness      History of Present Illness   Stephanie Hess is a 73 y.o. female with PMHX of GERD, diverticulitis, C. Diff 2020, COPD, left breast cancer presenting today for follow-up.  Discussed the use of AI scribe software for clinical note transcription with the patient, who gave verbal consent to proceed.  Patient last seen by myself on 09/23/2024 after being diagnosed with diverticulitis at [facility].  Patient did not finish antibiotic and continued to have abdominal pain. Repeat CT abdomen pelvis with no acute findings.  She was started on dicyclomine  for abdominal pain.   Today, patient reports she has been doing much better and is not currently having abdominal pain.  Since having last episode of diverticulitis, she now endorses issues with constipation.  Typically, constipation persists for several days, followed by use of [employer] Milk of Magnesia about once a month, resulting in loose stools or diarrhea. She states on Christmas Day she had a very large bowel movement and had to strain to pass it.  She reports that Miralax  was not helpful in the past and is not used consistently. No fiber supplements or other bowel medications are used.   Patient seen at [facility] emergency room on 09/12/2024 with abdominal pain and constipation for 3 days. WBC 17.5 Treated with cipro  and metronidazole .     09/12/2024 CT Abd pelvis with contrast: acute uncomplicated diverticulitis.    Patient seen by PCP on 08/25/2024 for colitis with associated abdominal pain, nausea, vomiting, diarrhea, and hematochezia.    Patient seen at [facility] emergency room on 08/18/2024 with abdominal pain, nausea, vomiting.    Past Medical History:  Diagnosis Date   Allergy    Anxiety    Arthritis    Asthma 2022   Breast cancer (HCC)    Cancer (HCC)    Cataract    COPD (chronic obstructive pulmonary disease) (HCC)    Diverticula of colon    GERD (gastroesophageal reflux disease)    Heart murmur    Hyperlipidemia    Nerve pain 09/21/2024   Neuromuscular disorder (HCC)    Oxygen  deficiency    Personal history of radiation therapy    Renal insufficiency    2009 or 2010   Skin cancer 1995   [place]   UTI (urinary tract infection) 09/21/2024    Past Surgical History:  Procedure Laterality Date   APPENDECTOMY     AXILLARY SENTINEL NODE BIOPSY Left 08/06/2023   Procedure: AXILLARY SENTINEL NODE BIOPSY;  Surgeon: Lane Shope, MD;  Location: [facility] ORS;  Service: General;  Laterality: Left;   BREAST BIOPSY Left 07/02/2023   US  LT BREAST BX W LOC DEV 1ST LESION IMG BX SPEC US  GUIDE 07/02/2023 [facility]   BREAST LUMPECTOMY Left 08/06/2023   BREAST LUMPECTOMY WITH RADIOFREQUENCY TAG IDENTIFICATION Left 08/06/2023   Procedure: BREAST LUMPECTOMY WITH RADIOFREQUENCY TAG IDENTIFICATION;  Surgeon: Lane Shope, MD;  Location: [facility] ORS;  Service: General;  Laterality: Left;   CESAREAN SECTION     ECTOPIC PREGNANCY SURGERY     TUBAL LIGATION  Current Outpatient Medications  Medication Sig Dispense Refill   Na Sulfate-K Sulfate-Mg Sulfate concentrate (SUPREP) 17.5-3.13-1.6 GM/177ML SOLN Take 1 kit (354 mLs total) by mouth once for 1 dose. 354 mL 0   acetaminophen  (TYLENOL ) 500 MG tablet Take 1,000 mg by mouth every 6 (six) hours as needed.     budesonide  (PULMICORT ) 0.5 MG/2ML nebulizer solution Take 2 mLs by nebulization 2 (two) times daily.     dicyclomine  (BENTYL ) 10 MG capsule Take 1 capsule (10 mg total) by mouth 4 (four) times daily -  before meals and at bedtime. 90 capsule 0   formoterol   (PERFOROMIST ) 20 MCG/2ML nebulizer solution INHALE 2 ML (20 MCG TOTAL) BY NEBULIZATION TWO (2) TIMES A DAY.     ipratropium (ATROVENT ) 0.02 % nebulizer solution Take 2.5 mLs (0.5 mg total) by nebulization 4 (four) times daily. 75 mL 3   letrozole  (FEMARA ) 2.5 MG tablet TAKE 1 TABLET (2.5 MG TOTAL) BY MOUTH DAILY. START AFTER RADIATION IS COMPLETE 30 tablet 6   lidocaine  (LIDODERM ) 5 % Place 1 patch onto the skin daily. Remove & Discard patch within 12 hours or as directed by MD     omeprazole  (PRILOSEC) 20 MG capsule Take 1 capsule (20 mg total) by mouth daily. 30 capsule 2   ondansetron  (ZOFRAN -ODT) 4 MG disintegrating tablet Take 1 tablet (4 mg total) by mouth every 8 (eight) hours as needed for nausea or vomiting. 20 tablet 0   Oxycodone  HCl 10 MG TABS Take 10 mg by mouth every 8 (eight) hours as needed.     OXYGEN  Inhale into the lungs. 2 litre at night     sodium chloride  HYPERTONIC 3 % nebulizer solution Take 4 mLs by nebulization as needed for other. 750 mL 1   traZODone  (DESYREL ) 100 MG tablet Take 1 tablet (100 mg total) by mouth at bedtime as needed. for sleep 30 tablet 2   No current facility-administered medications for this visit.    Allergies as of 11/08/2024 - Review Complete 11/08/2024  Allergen Reaction Noted   Clarithromycin Nausea Only, Rash, and Dermatitis 03/01/2016   Penicillins Nausea And Vomiting and Other (See Comments) 03/01/2016   Shellfish allergy Nausea And Vomiting 03/01/2016   Sulfa antibiotics Rash and Other (See Comments) 09/19/2014   Ciprofloxacin  Itching 08/10/2024   Doxycycline  Dermatitis 08/10/2024   Amoxicillin-pot clavulanate Nausea And Vomiting and Nausea Only 09/21/2014   Ezetimibe  Other (See Comments) 06/11/2024   Iodinated contrast media Other (See Comments) 09/19/2014   Atorvastatin  Rash and Dermatitis 03/18/2017   Nitrofurantoin Rash and Dermatitis 05/06/2016    Family History  Problem Relation Age of Onset   Breast cancer Paternal Aunt     Heart attack Mother     Social History   Socioeconomic History   Marital status: Divorced    Spouse name: Not on file   Number of children: Not on file   Years of education: Not on file   Highest education level: 12th grade  Occupational History   Not on file  Tobacco Use   Smoking status: Every Day    Current packs/day: 0.50    Average packs/day: 0.5 packs/day for 50.0 years (25.0 ttl pk-yrs)    Types: Cigarettes    Passive exposure: Past   Smokeless tobacco: Never  Vaping Use   Vaping status: Never Used  Substance and Sexual Activity   Alcohol use: Not Currently    Comment: social   Drug use: No   Sexual activity: Not Currently  Birth control/protection: None  Other Topics Concern   Not on file  Social History Narrative   Not on file   Social Drivers of Health   Tobacco Use: High Risk (11/08/2024)   Patient History    Smoking Tobacco Use: Every Day    Smokeless Tobacco Use: Never    Passive Exposure: Past  Financial Resource Strain: Medium Risk (08/23/2024)   Overall Financial Resource Strain (CARDIA)    Difficulty of Paying Living Expenses: Somewhat hard  Food Insecurity: No Food Insecurity (08/23/2024)   Epic    Worried About Programme Researcher, Broadcasting/film/video in the Last Year: Never true    Ran Out of Food in the Last Year: Never true  Transportation Needs: No Transportation Needs (08/23/2024)   Epic    Lack of Transportation (Medical): No    Lack of Transportation (Non-Medical): No  Physical Activity: Inactive (08/23/2024)   Exercise Vital Sign    Days of Exercise per Week: 0 days    Minutes of Exercise per Session: Not on file  Stress: No Stress Concern Present (08/23/2024)   [employer] of Occupational Health - Occupational Stress Questionnaire    Feeling of Stress: Only a little  Social Connections: Moderately Isolated (08/23/2024)   Social Connection and Isolation Panel    Frequency of Communication with Friends and Family: More than three times a week     Frequency of Social Gatherings with Friends and Family: Patient declined    Attends Religious Services: 1 to 4 times per year    Active Member of [employer] or Organizations: No    Attends Engineer, Structural: Not on file    Marital Status: Divorced  Intimate Partner Violence: Not At Risk (09/11/2024)   Received from [facility]   Epic    Within the last year, have you been afraid of your partner or ex-partner?: No    Within the last year, have you been humiliated or emotionally abused in other ways by your partner or ex-partner?: No    Within the last year, have you been kicked, hit, slapped, or otherwise physically hurt by your partner or ex-partner?: No    Within the last year, have you been raped or forced to have any kind of sexual activity by your partner or ex-partner?: No  Depression (PHQ2-9): Low Risk (08/25/2024)   Depression (PHQ2-9)    PHQ-2 Score: 3  Recent Concern: Depression (PHQ2-9) - Medium Risk (06/30/2024)   Depression (PHQ2-9)    PHQ-2 Score: 9  Alcohol Screen: Low Risk (12/17/2023)   Alcohol Screen    Last Alcohol Screening Score (AUDIT): 1  Housing: Low Risk (08/23/2024)   Epic    Unable to Pay for Housing in the Last Year: No    Number of Times Moved in the Last Year: 0    Homeless in the Last Year: No  Utilities: Not At Risk (02/12/2024)   AHC Utilities    Threatened with loss of utilities: No  Health Literacy: Adequate Health Literacy (11/20/2023)   B1300 Health Literacy    Frequency of need for help with medical instructions: Never     RELEVANT GI HISTORY, IMAGING AND LABS: CBC    Component Value Date/Time   WBC 10.5 09/23/2024 1511   RBC 4.14 09/23/2024 1511   HGB 11.9 (L) 09/23/2024 1511   HGB 12.0 08/25/2024 1537   HCT 36.3 09/23/2024 1511   HCT 37.1 08/25/2024 1537   PLT 509 (H) 09/23/2024 1511   PLT 378 08/25/2024 1537  MCV 87.7 09/23/2024 1511   MCV 89 08/25/2024 1537   MCV 90 02/23/2014 1055   MCH 28.7 09/23/2024 1511   MCHC  32.8 09/23/2024 1511   RDW 14.4 09/23/2024 1511   RDW 14.5 08/25/2024 1537   RDW 13.3 02/23/2014 1055   LYMPHSABS 3.3 09/23/2024 1511   LYMPHSABS 3.4 (H) 08/25/2024 1537   LYMPHSABS 3.5 02/23/2014 1055   MONOABS 0.7 09/23/2024 1511   MONOABS 1.1 (H) 02/23/2014 1055   EOSABS 0.3 09/23/2024 1511   EOSABS 0.3 08/25/2024 1537   EOSABS 0.2 02/23/2014 1055   BASOSABS 0.1 09/23/2024 1511   BASOSABS 0.1 08/25/2024 1537   BASOSABS 0.1 02/23/2014 1055   Recent Labs    12/19/23 0856 08/03/24 0810 08/25/24 1537 09/23/24 1511  HGB 12.7 12.9 12.0 11.9*    CMP     Component Value Date/Time   NA 140 08/03/2024 0810   NA 139 08/11/2013 0924   K 3.7 08/03/2024 0810   K 4.3 08/11/2013 0924   CL 102 08/03/2024 0810   CL 107 08/11/2013 0924   CO2 19 (L) 08/03/2024 0810   CO2 26 08/11/2013 0924   GLUCOSE 96 08/03/2024 0810   GLUCOSE 95 01/14/2024 1109   GLUCOSE 89 08/11/2013 0924   BUN 12 08/03/2024 0810   BUN 12 08/11/2013 0924   CREATININE 0.89 08/03/2024 0810   CREATININE 0.89 01/14/2024 1109   CREATININE 0.87 08/11/2013 0924   CALCIUM  10.0 08/03/2024 0810   CALCIUM  9.6 08/11/2013 0924   PROT 7.1 08/03/2024 0810   PROT 8.1 08/11/2013 0924   ALBUMIN 4.1 08/03/2024 0810   ALBUMIN 4.0 08/11/2013 0924   AST 15 08/03/2024 0810   AST 26 01/14/2024 1109   ALT 14 08/03/2024 0810   ALT 22 01/14/2024 1109   ALT 47 08/11/2013 0924   ALKPHOS 66 08/03/2024 0810   ALKPHOS 101 08/11/2013 0924   BILITOT <0.2 08/03/2024 0810   BILITOT 0.6 01/14/2024 1109   GFRNONAA >60 01/14/2024 1109   GFRNONAA >60 08/11/2013 0924   GFRAA 106 08/12/2018 1536   GFRAA >60 08/11/2013 0924      Latest Ref Rng & Units 08/03/2024    8:10 AM 01/14/2024   11:09 AM 12/19/2023    8:54 AM  Hepatic Function  Total Protein 6.0 - 8.5 g/dL 7.1  7.6  6.8   Albumin 3.8 - 4.8 g/dL 4.1  3.9  4.1   AST 0 - 40 IU/L 15  26  19    ALT 0 - 32 IU/L 14  22  21    Alk Phosphatase 49 - 135 IU/L 66  53  69   Total Bilirubin 0.0  - 1.2 mg/dL <9.7  0.6  <9.7       Review of Systems   All systems reviewed and negative except where noted in HPI.    Physical Exam  BP 127/82   Pulse 82   Temp 98.7 F (37.1 C) (Oral)   Ht 5' 4 (1.626 m)   Wt 141 lb 2 oz (64 kg)   SpO2 95%   BMI 24.22 kg/m  No LMP recorded. Patient is postmenopausal. General:   Alert and oriented. Pleasant and cooperative. Well-nourished and well-developed.  In no acute distress. Head:  Normocephalic and atraumatic. Eyes:  Without icterus Ears:  Normal auditory acuity. Abdomen:  Normal bowel sounds.  No bruits.  Soft, non-tender and non-distended without masses, hepatosplenomegaly or hernias noted.  No guarding or rebound tenderness.  Rectal:  Deferred. Msk:  Symmetrical without  gross deformities. Normal posture. Extremities:  Without edema. Neurologic:  Alert and  oriented x4;  grossly normal neurologically. Skin:  Intact without significant lesions or rashes. Psych:  Alert and cooperative. Normal mood and affect.   Assessment & Plan   RASHEEDA MULVEHILL is a 73 y.o. female presenting today for follow-up due to recent diverticulitis flare and constipation  IBS-Constipation.  MiraLAX  has not worked for her in the past.  Will have constipation and then take milk of magnesia, which leads to looser stools.  - Recommend High Fiber diet with fruits, vegetables, and whole grains. - Drink 64 ounces of Fluids Daily. - Patient given samples of Linzess 72 mcg and 145 to trial.  She will let me know which dose works best for her and the next prescription will be sent into the pharmacy.  Discussed that diarrhea may occur in the first couple of weeks.   Recent history of diverticulitis.  Not currently having any symptoms and last CT scan unremarkable. -Will schedule for colonoscopy due to recent colitis and diverticulitis. The risks, benefits, and alternatives have been discussed with the patient in detail, which include, but are not limited to:  bleeding, infection, perforation & drug reaction. The patient states understanding and desires to proceed. - Will obtain pulmonary clearance   Grayce Bohr, DNP, AGNP-C [facility]   "

## 2024-11-08 ENCOUNTER — Ambulatory Visit: Admitting: Family Medicine

## 2024-11-08 ENCOUNTER — Encounter: Payer: Self-pay | Admitting: Family Medicine

## 2024-11-08 ENCOUNTER — Encounter: Payer: Self-pay | Admitting: *Deleted

## 2024-11-08 VITALS — BP 127/82 | HR 82 | Temp 98.7°F | Ht 64.0 in | Wt 141.1 lb

## 2024-11-08 DIAGNOSIS — Z8719 Personal history of other diseases of the digestive system: Secondary | ICD-10-CM

## 2024-11-08 DIAGNOSIS — K581 Irritable bowel syndrome with constipation: Secondary | ICD-10-CM

## 2024-11-08 MED ORDER — NA SULFATE-K SULFATE-MG SULF 17.5-3.13-1.6 GM/177ML PO SOLN: 1.0000 | Freq: Once | ORAL | 0 refills | Status: AC

## 2024-11-08 NOTE — Patient Instructions (Signed)
 Linzess works best when taken once a day every day, on an empty stomach, at least 30 minutes before your first meal of the day.  When Linzess is taken daily as directed:  *Constipation relief is typically felt in about a week Diarrhea may occur in the first 2 weeks -keep taking it.  The diarrhea should go away and you should start having normal, complete, full bowel movements. It may be helpful to start treatment when you can be near the comfort of your own bathroom, such as a weekend.

## 2024-11-15 ENCOUNTER — Telehealth: Payer: Self-pay

## 2024-11-15 NOTE — Telephone Encounter (Signed)
 Received medical clearance -patient is cleared to have procedure per Lieutenant Moots MD.

## 2024-11-22 ENCOUNTER — Inpatient Hospital Stay: Attending: Oncology | Admitting: Oncology

## 2024-11-22 ENCOUNTER — Encounter: Payer: Self-pay | Admitting: Oncology

## 2024-11-22 VITALS — BP 96/68 | HR 89 | Temp 98.2°F | Resp 18 | Wt 140.2 lb

## 2024-11-22 DIAGNOSIS — Z853 Personal history of malignant neoplasm of breast: Secondary | ICD-10-CM | POA: Diagnosis not present

## 2024-11-22 DIAGNOSIS — Z79899 Other long term (current) drug therapy: Secondary | ICD-10-CM

## 2024-11-22 DIAGNOSIS — Z08 Encounter for follow-up examination after completed treatment for malignant neoplasm: Secondary | ICD-10-CM

## 2024-11-22 DIAGNOSIS — Z79811 Long term (current) use of aromatase inhibitors: Secondary | ICD-10-CM

## 2024-11-22 NOTE — Progress Notes (Signed)
 "    Hematology/Oncology Consult note [facility]  [phone] [phone]  Patient Care Team: Towana Small, FNP as PCP - General (Family Medicine) Georgina Shasta POUR, RN as Oncology Nurse Navigator Melanee Annah BROCKS, MD as Consulting Physician (Oncology) Diana Rodgers PARAS, MD as Referring Physician (Pulmonary Disease)   Name of the patient: Stephanie Hess  [id number]  1951/01/21   Date of visit: 11/22/2024  Diagnosis- pathological prognostic stage Ia invasive lobular carcinoma of the left breast pT1b N0 M0 ER/PR positive HER2 negative grade 2     Chief complaint/ Reason for visit-routine follow-up of breast cancer on letrozole   Heme/Onc history: Patient is a 74 year old female who underwent a routine screening mammogram in August 2024 which showed a possible mass in the left breast.  This was followed by a diagnostic mammogram which showed a 7 x 5 x 6 mm mass at the 12 o'clock position of the left breast 5 cm from the nipple.  No suspicious left axillary lymph nodes.  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 Family History significant for paternal aunt with breast cancer..  Menarche at the age of 68.  G4, P2.  No prior abnormal breast biopsies. MRI bilateral breast showed 1.2 cm enhancing mass in the left breast and 4 mm indeterminate mass in the inferior left breast.  The 4 mm mass was biopsied and was negative for malignancy            ```   Lumpectomy pathology from 08/06/2023 showed 0.9 cm invasive carcinoma with mixed lobular and ductal features with negative margins.  No evidence of lymphovascular invasion.  Grade 2.  1 sentinel lymph node negative for malignancy.  Oncotype testing showed intermediate risk for of 14 and given her age she does not require any adjuvant chemotherapy for this score.  She completed  adjuvant radiation therapy and started taking letrozole  in January 2025.  Patient has baseline osteopenia with a T-score of -1.5 in the left femur neck  Interval history- Stephanie Hess is a 74 year old female with hormone receptor-positive breast cancer on adjuvant letrozole  therapy who presents for routine oncology follow-up.  She is in her second year of adjuvant letrozole  therapy, initiated in January of the previous year, and remains adherent to her regimen. She experiences intermittent hot flashes less than weekly, of brief duration, which are tolerable and do not require intervention. She reports no new symptoms related to breast cancer or its treatment.  She has osteopenia, identified on bone density scan last year. She is not currently taking calcium  or vitamin D  supplements due to gastrointestinal discomfort, describing her symptoms as gut's messed up when attempting supplementation.       ECOG PS- 1 Pain scale- 0   Review of systems- Review of Systems  Constitutional:  Negative for chills, fever, malaise/fatigue and weight loss.  HENT:  Negative for congestion, ear discharge and nosebleeds.   Eyes:  Negative for blurred vision.  Respiratory:  Negative for cough, hemoptysis, sputum production, shortness of breath and wheezing.   Cardiovascular:  Negative for chest pain, palpitations, orthopnea and claudication.  Gastrointestinal:  Negative for abdominal pain, blood in stool, constipation, diarrhea, heartburn, melena, nausea and vomiting.  Genitourinary:  Negative for dysuria, flank pain, frequency, hematuria and urgency.  Musculoskeletal:  Negative for back pain, joint pain and myalgias.  Skin:  Negative for rash.  Neurological:  Negative for dizziness, tingling, focal weakness, seizures, weakness and headaches.  Endo/Heme/Allergies:  Does not bruise/bleed easily.  Psychiatric/Behavioral:  Negative for depression and suicidal ideas. The patient does not have insomnia.        Allergies[1]   Past Medical History:  Diagnosis Date   Allergy    Anxiety    Arthritis    Asthma 2022   Breast cancer (HCC)    Cancer (HCC)    Cataract    COPD (chronic obstructive pulmonary disease) (HCC)    Diverticula of colon    GERD (gastroesophageal reflux disease)    Heart murmur    Hyperlipidemia    Nerve pain 09/21/2024   Neuromuscular disorder (HCC)    Oxygen  deficiency    Personal history of radiation therapy    Renal insufficiency    2009 or 2010   Skin cancer 1995   [place]   UTI (urinary tract infection) 09/21/2024     Past Surgical History:  Procedure Laterality Date   APPENDECTOMY     AXILLARY SENTINEL NODE BIOPSY Left 08/06/2023   Procedure: AXILLARY SENTINEL NODE BIOPSY;  Surgeon: Lane Shope, MD;  Location: [facility] ORS;  Service: General;  Laterality: Left;   BREAST BIOPSY Left 07/02/2023   US  LT BREAST BX W LOC DEV 1ST LESION IMG BX SPEC US  GUIDE 07/02/2023 [facility]   BREAST LUMPECTOMY Left 08/06/2023   BREAST LUMPECTOMY WITH RADIOFREQUENCY TAG IDENTIFICATION Left 08/06/2023   Procedure: BREAST LUMPECTOMY WITH RADIOFREQUENCY TAG IDENTIFICATION;  Surgeon: Lane Shope, MD;  Location: [facility] OR[facility];  Service: General;  Laterality: Left;   CESAREAN SECTION     ECTOPIC PREGNANCY SURGERY     TUBAL LIGATION      Social History   Socioeconomic History   Marital status: Divorced    Spouse name: Not on file   Number of children: Not on file   Years of education: Not on file   Highest education level: 12th grade  Occupational History   Not on file  Tobacco Use   Smoking status: Every Day    Current packs/day: 0.50    Average packs/day: 0.5 packs/day for 50.0 years (25.0 ttl pk-yrs)    Types: Cigarettes    Passive exposure: Past   Smokeless tobacco: Never  Vaping Use   Vaping status: Never Used  Substance and Sexual Activity   Alcohol use: Not Currently    Comment: social   Drug use: No   Sexual activity: Not Currently    Birth  control/protection: None  Other Topics Concern   Not on file  Social History Narrative   Not on file   Social Drivers of Health   Tobacco Use: High Risk (11/22/2024)   Patient History    Smoking Tobacco Use: Every Day    Smokeless Tobacco Use: Never    Passive Exposure: Past  Financial Resource Strain: Medium Risk (08/23/2024)   Overall Financial Resource Strain (CARDIA)    Difficulty of Paying Living Expenses: Somewhat hard  Food Insecurity: No Food Insecurity (08/23/2024)   Epic    Worried About Programme Researcher, Broadcasting/film/video in the Last Year: Never true    [employer] of Food in the Last Year: Never true  Transportation Needs: No Transportation Needs (08/23/2024)   Epic    Lack of Transportation (Medical): No    Lack of Transportation (Non-Medical): No  Physical Activity: Inactive (08/23/2024)   Exercise Vital Sign    Days of Exercise per Week: 0 days    Minutes of Exercise  per Session: Not on file  Stress: No Stress Concern Present (08/23/2024)   [employer] of Occupational Health - Occupational Stress Questionnaire    Feeling of Stress: Only a little  Social Connections: Moderately Isolated (08/23/2024)   Social Connection and Isolation Panel    Frequency of Communication with Friends and Family: More than three times a week    Frequency of Social Gatherings with Friends and Family: Patient declined    Attends Religious Services: 1 to 4 times per year    Active Member of [employer] or Organizations: No    Attends Engineer, Structural: Not on file    Marital Status: Divorced  Intimate Partner Violence: Not At Risk (09/11/2024)   [facility]   Epic    Within the last year, have you been afraid of your partner or ex-partner?: No    Within the last year, have you been humiliated or emotionally abused in other ways by your partner or ex-partner?: No    Within the last year, have you been kicked, hit, slapped, or otherwise physically hurt by your partner or  ex-partner?: No    Within the last year, have you been raped or forced to have any kind of sexual activity by your partner or ex-partner?: No  Depression (PHQ2-9): Low Risk (08/25/2024)   Depression (PHQ2-9)    PHQ-2 Score: 3  Recent Concern: Depression (PHQ2-9) - Medium Risk (06/30/2024)   Depression (PHQ2-9)    PHQ-2 Score: 9  Alcohol Screen: Low Risk (12/17/2023)   Alcohol Screen    Last Alcohol Screening Score (AUDIT): 1  Housing: Low Risk (08/23/2024)   Epic    Unable to Pay for Housing in the Last Year: No    Number of Times Moved in the Last Year: 0    Homeless in the Last Year: No  Utilities: Not At Risk (02/12/2024)   [employer] Utilities    Threatened with loss of utilities: No  Health Literacy: Adequate Health Literacy (11/20/2023)   B1300 Health Literacy    Frequency of need for help with medical instructions: Never    Family History  Problem Relation Age of Onset   Breast cancer Paternal Aunt    Heart attack Mother     Current Medications[2]  Physical exam:  Vitals:   11/22/24 1135  BP: 96/68  Pulse: 89  Resp: 18  Temp: 98.2 F (36.8 C)  SpO2: 97%  Weight: 140 lb 3.2 oz (63.6 kg)   Physical Exam Cardiovascular:     Rate and Rhythm: Normal rate and regular rhythm.     Heart sounds: Normal heart sounds.  Pulmonary:     Effort: Pulmonary effort is normal.     Breath sounds: Normal breath sounds.  Skin:    General: Skin is warm and dry.  Neurological:     Mental Status: She is alert and oriented to person, place, and time.    Breast exam was performed in seated and lying down position. Patient is status post left lumpectomy with a well-healed surgical scar. No evidence of any palpable masses. No evidence of axillary adenopathy. No evidence of any palpable masses or lumps in the right breast. No evidence of right axillary adenopathy.  Mild diffuse tenderness to palpation over right breast   I have personally reviewed labs listed below:    Latest Ref Rng & Units  08/03/2024    8:10 AM  CMP  Glucose 70 - 99 mg/dL 96   BUN 8 - 27  mg/dL 12   Creatinine 9.42 - 1.00 mg/dL 9.10   Sodium 865 - 855 mmol/L 140   Potassium 3.5 - 5.2 mmol/L 3.7   Chloride 96 - 106 mmol/L 102   CO2 20 - 29 mmol/L 19   Calcium  8.7 - 10.3 mg/dL 89.9   Total Protein 6.0 - 8.5 g/dL 7.1   Total Bilirubin 0.0 - 1.2 mg/dL <9.7   Alkaline Phos 49 - 135 IU/L 66   AST 0 - 40 IU/L 15   ALT 0 - 32 IU/L 14       Latest Ref Rng & Units 09/23/2024    3:11 PM  CBC  WBC 4.0 - 10.5 K/uL 10.5   Hemoglobin 12.0 - 15.0 g/dL 88.0   Hematocrit 63.9 - 46.0 % 36.3   Platelets 150 - 400 K/uL 509       Assessment and plan- Patient is a 74 y.o. female with a history of stage I left breast cancer ER/PR positive HER2 negative presently on letrozole  here for a routine follow-up  Assessment and Plan    Breast cancer on adjuvant letrozole  therapy - Clinically patient is doing well with no concerning signs and symptoms of recurrence based on today's exam In the second year of a five-year adjuvant letrozole  regimen. Intermittent hot flashes noted but tolerable. - Continue letrozole  therapy for five years.  Also reinforced the importance of taking calcium  and vitamin D  along with - Surveillance mammograms will be arranged by surgery  Osteopenia Osteopenia likely age-related, possibly worsened by letrozole . Not on calcium  or vitamin D  due to gastrointestinal intolerance. Bone density not due for reassessment until next year. - Recommend calcium  1200 mg and vitamin D  800 IU daily. - Advise trial of different calcium  formulations if gastrointestinal intolerance persists. - Plan repeat bone density scan next year.         Visit Diagnosis 1. Use of letrozole  (Femara )   2. Encounter for follow-up surveillance of breast cancer   3. High risk medication use      Dr. Annah Skene, MD, MPH [facility] [phone] 11/22/2024 3:29 PM                    [1]  Allergies Allergen Reactions   Clarithromycin Nausea Only, Rash and Dermatitis    rash  clarithromycin   Penicillins Nausea And Vomiting and Other (See Comments)    Has patient had a PCN reaction causing immediate rash, facial/tongue/throat swelling, SOB or lightheadedness with hypotension: No Has patient had a PCN reaction causing severe rash involving mucus membranes or skin necrosis: No Has patient had a PCN reaction that required hospitalization: No Has patient had a PCN reaction occurring within the last 10 years: Yes If all of the above answers are NO, then may proceed with Cephalosporin use.    Shellfish Allergy Nausea And Vomiting   Sulfa Antibiotics Rash and Other (See Comments)    Other reaction(s): Unknown   Ciprofloxacin  Itching   Doxycycline  Dermatitis   Amoxicillin-Pot Clavulanate Nausea And Vomiting and Nausea Only   Ezetimibe  Other (See Comments)    congestion  ezetimibe    Iodinated Contrast Media Other (See Comments)    acute renal insufficiency acute renal insufficiency   Atorvastatin  Rash and Dermatitis   Nitrofurantoin Rash and Dermatitis  [2]  Current Outpatient Medications:    acetaminophen  (TYLENOL ) 500 MG tablet, Take 1,000 mg by mouth every 6 (six) hours as needed., Disp: , Rfl:    budesonide  (PULMICORT )  0.5 MG/2ML nebulizer solution, Take 2 mLs by nebulization 2 (two) times daily., Disp: , Rfl:    formoterol  (PERFOROMIST ) 20 MCG/2ML nebulizer solution, INHALE 2 ML (20 MCG TOTAL) BY NEBULIZATION TWO (2) TIMES A DAY., Disp: , Rfl:    ipratropium (ATROVENT ) 0.02 % nebulizer solution, Take 2.5 mLs (0.5 mg total) by nebulization 4 (four) times daily., Disp: 75 mL, Rfl: 3   letrozole  (FEMARA ) 2.5 MG tablet, TAKE 1 TABLET (2.5 MG TOTAL) BY MOUTH DAILY. START AFTER RADIATION IS COMPLETE, Disp: 30 tablet, Rfl: 6   lidocaine  (LIDODERM ) 5 %, Place 1 patch onto the skin daily. Remove & Discard patch within 12 hours or as directed by MD, Disp: , Rfl:     omeprazole  (PRILOSEC) 20 MG capsule, Take 1 capsule (20 mg total) by mouth daily., Disp: 30 capsule, Rfl: 2   OXYGEN , Inhale into the lungs. 2 litre at night, Disp: , Rfl:    sodium chloride  HYPERTONIC 3 % nebulizer solution, Take 4 mLs by nebulization as needed for other., Disp: 750 mL, Rfl: 1   traZODone  (DESYREL ) 100 MG tablet, Take 1 tablet (100 mg total) by mouth at bedtime as needed. for sleep, Disp: 30 tablet, Rfl: 2   dicyclomine  (BENTYL ) 10 MG capsule, Take 1 capsule (10 mg total) by mouth 4 (four) times daily -  before meals and at bedtime. (Patient not taking: Reported on 11/22/2024), Disp: 90 capsule, Rfl: 0   ondansetron  (ZOFRAN -ODT) 4 MG disintegrating tablet, Take 1 tablet (4 mg total) by mouth every 8 (eight) hours as needed for nausea or vomiting. (Patient not taking: Reported on 11/22/2024), Disp: 20 tablet, Rfl: 0   Oxycodone  HCl 10 MG TABS, Take 10 mg by mouth every 8 (eight) hours as needed. (Patient not taking: Reported on 11/22/2024), Disp: , Rfl:   "

## 2024-11-24 ENCOUNTER — Other Ambulatory Visit: Payer: Self-pay | Admitting: Family Medicine

## 2024-11-24 DIAGNOSIS — F411 Generalized anxiety disorder: Secondary | ICD-10-CM

## 2024-11-25 ENCOUNTER — Other Ambulatory Visit: Payer: Self-pay | Admitting: Family Medicine

## 2024-11-25 DIAGNOSIS — F411 Generalized anxiety disorder: Secondary | ICD-10-CM

## 2024-11-29 NOTE — Telephone Encounter (Signed)
 PDMP reviewed, no red flags. Rx sent to pharmacy on file.

## 2024-12-16 ENCOUNTER — Encounter: Payer: Self-pay | Admitting: Gastroenterology

## 2024-12-20 ENCOUNTER — Ambulatory Visit: Admit: 2024-12-20 | Admitting: Gastroenterology

## 2024-12-20 ENCOUNTER — Encounter: Admission: RE | Payer: Self-pay | Source: Home / Self Care

## 2024-12-20 HISTORY — DX: Dyspnea, unspecified: R06.00

## 2025-01-10 ENCOUNTER — Inpatient Hospital Stay: Admitting: *Deleted

## 2025-02-07 ENCOUNTER — Ambulatory Visit: Admitting: Family Medicine

## 2025-05-23 ENCOUNTER — Inpatient Hospital Stay: Admitting: Oncology
